# Patient Record
Sex: Female | Born: 1960 | Race: White | Hispanic: No | Marital: Married | Smoking: Never smoker
Health system: Southern US, Community
[De-identification: ages and names within clinical notes are randomized; demographics above are authoritative.]

## PROBLEM LIST (undated history)

## (undated) DIAGNOSIS — R112 Nausea with vomiting, unspecified: Secondary | ICD-10-CM

## (undated) DIAGNOSIS — G4700 Insomnia, unspecified: Secondary | ICD-10-CM

## (undated) DIAGNOSIS — D373 Neoplasm of uncertain behavior of appendix: Secondary | ICD-10-CM

## (undated) DIAGNOSIS — G43909 Migraine, unspecified, not intractable, without status migrainosus: Secondary | ICD-10-CM

## (undated) DIAGNOSIS — E0500 Thyrotoxicosis with diffuse goiter without thyrotoxic crisis or storm: Secondary | ICD-10-CM

## (undated) DIAGNOSIS — Z87440 Personal history of urinary (tract) infections: Secondary | ICD-10-CM

## (undated) DIAGNOSIS — K529 Noninfective gastroenteritis and colitis, unspecified: Secondary | ICD-10-CM

## (undated) DIAGNOSIS — E039 Hypothyroidism, unspecified: Secondary | ICD-10-CM

## (undated) DIAGNOSIS — I10 Essential (primary) hypertension: Secondary | ICD-10-CM

## (undated) DIAGNOSIS — E785 Hyperlipidemia, unspecified: Secondary | ICD-10-CM

## (undated) DIAGNOSIS — Z9889 Other specified postprocedural states: Secondary | ICD-10-CM

## (undated) DIAGNOSIS — R87619 Unspecified abnormal cytological findings in specimens from cervix uteri: Secondary | ICD-10-CM

## (undated) DIAGNOSIS — G4730 Sleep apnea, unspecified: Secondary | ICD-10-CM

## (undated) DIAGNOSIS — R002 Palpitations: Secondary | ICD-10-CM

## (undated) DIAGNOSIS — N938 Other specified abnormal uterine and vaginal bleeding: Secondary | ICD-10-CM

## (undated) DIAGNOSIS — D649 Anemia, unspecified: Secondary | ICD-10-CM

## (undated) DIAGNOSIS — J189 Pneumonia, unspecified organism: Secondary | ICD-10-CM

## (undated) DIAGNOSIS — I499 Cardiac arrhythmia, unspecified: Secondary | ICD-10-CM

## (undated) DIAGNOSIS — K8020 Calculus of gallbladder without cholecystitis without obstruction: Secondary | ICD-10-CM

## (undated) DIAGNOSIS — R911 Solitary pulmonary nodule: Secondary | ICD-10-CM

## (undated) DIAGNOSIS — C181 Malignant neoplasm of appendix: Secondary | ICD-10-CM

## (undated) DIAGNOSIS — F329 Major depressive disorder, single episode, unspecified: Secondary | ICD-10-CM

## (undated) DIAGNOSIS — N83209 Unspecified ovarian cyst, unspecified side: Secondary | ICD-10-CM

## (undated) DIAGNOSIS — F32A Depression, unspecified: Secondary | ICD-10-CM

## (undated) DIAGNOSIS — K219 Gastro-esophageal reflux disease without esophagitis: Secondary | ICD-10-CM

## (undated) DIAGNOSIS — C801 Malignant (primary) neoplasm, unspecified: Secondary | ICD-10-CM

## (undated) DIAGNOSIS — Z9289 Personal history of other medical treatment: Secondary | ICD-10-CM

## (undated) DIAGNOSIS — F419 Anxiety disorder, unspecified: Secondary | ICD-10-CM

## (undated) HISTORY — PX: TONSILLECTOMY: SUR1361

## (undated) HISTORY — DX: Unspecified abnormal cytological findings in specimens from cervix uteri: R87.619

## (undated) HISTORY — DX: Insomnia, unspecified: G47.00

## (undated) HISTORY — DX: Hyperlipidemia, unspecified: E78.5

## (undated) HISTORY — DX: Morbid (severe) obesity due to excess calories: E66.01

## (undated) HISTORY — DX: Essential (primary) hypertension: I10

## (undated) HISTORY — PX: SKIN CANCER EXCISION: SHX779

## (undated) HISTORY — DX: Palpitations: R00.2

## (undated) HISTORY — DX: Gastro-esophageal reflux disease without esophagitis: K21.9

## (undated) HISTORY — DX: Noninfective gastroenteritis and colitis, unspecified: K52.9

## (undated) HISTORY — PX: VAGINAL HYSTERECTOMY: SUR661

## (undated) HISTORY — DX: Unspecified ovarian cyst, unspecified side: N83.209

## (undated) HISTORY — PX: DILATION AND CURETTAGE OF UTERUS: SHX78

## (undated) HISTORY — DX: Malignant neoplasm of appendix: C18.1

## (undated) HISTORY — DX: Migraine, unspecified, not intractable, without status migrainosus: G43.909

## (undated) HISTORY — PX: NECK SURGERY: SHX720

## (undated) HISTORY — DX: Depression, unspecified: F32.A

## (undated) HISTORY — DX: Other specified abnormal uterine and vaginal bleeding: N93.8

## (undated) HISTORY — DX: Neoplasm of uncertain behavior of appendix: D37.3

## (undated) HISTORY — DX: Major depressive disorder, single episode, unspecified: F32.9

## (undated) HISTORY — DX: Sleep apnea, unspecified: G47.30

## (undated) HISTORY — PX: DIAGNOSTIC LAPAROSCOPY: SUR761

## (undated) HISTORY — PX: OOPHORECTOMY: SHX86

## (undated) HISTORY — DX: Hypothyroidism, unspecified: E03.9

## (undated) HISTORY — PX: APPENDECTOMY: SHX54

## (undated) HISTORY — PX: VESICOVAGINAL FISTULA CLOSURE W/ TAH: SUR271

## (undated) HISTORY — DX: Calculus of gallbladder without cholecystitis without obstruction: K80.20

## (undated) HISTORY — PX: OTHER SURGICAL HISTORY: SHX169

## (undated) HISTORY — PX: CRYOTHERAPY: SHX1416

---

## 1999-12-08 ENCOUNTER — Other Ambulatory Visit: Admission: RE | Admit: 1999-12-08 | Discharge: 1999-12-08 | Payer: Self-pay | Admitting: Obstetrics and Gynecology

## 2004-06-09 ENCOUNTER — Encounter: Admission: RE | Admit: 2004-06-09 | Discharge: 2004-06-09 | Payer: Self-pay | Admitting: Family Medicine

## 2005-12-15 DIAGNOSIS — D099 Carcinoma in situ, unspecified: Secondary | ICD-10-CM

## 2005-12-15 DIAGNOSIS — D229 Melanocytic nevi, unspecified: Secondary | ICD-10-CM

## 2005-12-15 HISTORY — DX: Carcinoma in situ, unspecified: D09.9

## 2005-12-15 HISTORY — DX: Melanocytic nevi, unspecified: D22.9

## 2006-06-23 DIAGNOSIS — C4491 Basal cell carcinoma of skin, unspecified: Secondary | ICD-10-CM

## 2006-06-23 HISTORY — DX: Basal cell carcinoma of skin, unspecified: C44.91

## 2010-06-10 ENCOUNTER — Encounter: Admission: RE | Admit: 2010-06-10 | Discharge: 2010-06-10 | Payer: Self-pay | Admitting: Family Medicine

## 2011-02-12 DIAGNOSIS — R002 Palpitations: Secondary | ICD-10-CM

## 2011-02-12 DIAGNOSIS — N938 Other specified abnormal uterine and vaginal bleeding: Secondary | ICD-10-CM

## 2011-02-12 DIAGNOSIS — E0500 Thyrotoxicosis with diffuse goiter without thyrotoxic crisis or storm: Secondary | ICD-10-CM

## 2011-05-05 ENCOUNTER — Encounter: Payer: Self-pay | Admitting: Nurse Practitioner

## 2011-06-11 ENCOUNTER — Other Ambulatory Visit: Payer: Self-pay | Admitting: Family Medicine

## 2011-06-11 DIAGNOSIS — Z1231 Encounter for screening mammogram for malignant neoplasm of breast: Secondary | ICD-10-CM

## 2011-06-17 ENCOUNTER — Ambulatory Visit
Admission: RE | Admit: 2011-06-17 | Discharge: 2011-06-17 | Disposition: A | Discharge status: Home / Self Care | Payer: 59 | Source: Ambulatory Visit | Attending: Family Medicine | Admitting: Family Medicine

## 2011-06-17 DIAGNOSIS — Z1231 Encounter for screening mammogram for malignant neoplasm of breast: Secondary | ICD-10-CM

## 2012-06-08 ENCOUNTER — Other Ambulatory Visit: Payer: Self-pay | Admitting: Family Medicine

## 2012-06-08 DIAGNOSIS — Z1231 Encounter for screening mammogram for malignant neoplasm of breast: Secondary | ICD-10-CM

## 2012-08-09 ENCOUNTER — Ambulatory Visit: Payer: 59

## 2013-02-27 ENCOUNTER — Other Ambulatory Visit: Payer: Self-pay | Admitting: *Deleted

## 2013-02-27 MED ORDER — FENOFIBRATE 160 MG PO TABS: 160.0000 mg | ORAL_TABLET | Freq: Every day | ORAL | Status: DC

## 2013-02-27 MED ORDER — ESCITALOPRAM OXALATE 10 MG PO TABS: 10.0000 mg | ORAL_TABLET | Freq: Every day | ORAL | Status: DC

## 2013-02-27 MED ORDER — VALSARTAN-HYDROCHLOROTHIAZIDE 160-12.5 MG PO TABS: 1.0000 | ORAL_TABLET | Freq: Every day | ORAL | Status: DC

## 2013-02-27 NOTE — Telephone Encounter (Signed)
Last office visit on 06-06-12 with DFS. Please advise. Thank you

## 2013-03-21 ENCOUNTER — Telehealth: Payer: Self-pay | Admitting: Family Medicine

## 2013-03-21 MED ORDER — SCOPOLAMINE 1 MG/3DAYS TD PT72: 1.0000 | MEDICATED_PATCH | TRANSDERMAL | Status: DC

## 2013-03-21 NOTE — Telephone Encounter (Signed)
Pt aware rx sent into pharmacy. 

## 2013-03-21 NOTE — Telephone Encounter (Signed)
Patch RX  Sent to pharmacy

## 2013-03-21 NOTE — Telephone Encounter (Signed)
Patient wants sea sick patches for 9 days going on a cruise use [facility] [place]

## 2013-03-28 ENCOUNTER — Encounter: Payer: Self-pay | Admitting: Nurse Practitioner

## 2013-03-28 ENCOUNTER — Ambulatory Visit (INDEPENDENT_AMBULATORY_CARE_PROVIDER_SITE_OTHER): Payer: 59 | Admitting: Nurse Practitioner

## 2013-03-28 VITALS — BP 118/80 | HR 61 | Temp 98.1°F | Ht 69.5 in | Wt 233.0 lb

## 2013-03-28 DIAGNOSIS — E785 Hyperlipidemia, unspecified: Secondary | ICD-10-CM | POA: Insufficient documentation

## 2013-03-28 DIAGNOSIS — F329 Major depressive disorder, single episode, unspecified: Secondary | ICD-10-CM

## 2013-03-28 DIAGNOSIS — I10 Essential (primary) hypertension: Secondary | ICD-10-CM

## 2013-03-28 DIAGNOSIS — K219 Gastro-esophageal reflux disease without esophagitis: Secondary | ICD-10-CM

## 2013-03-28 DIAGNOSIS — E039 Hypothyroidism, unspecified: Secondary | ICD-10-CM | POA: Insufficient documentation

## 2013-03-28 MED ORDER — PANTOPRAZOLE SODIUM 40 MG PO TBEC: 40.0000 mg | DELAYED_RELEASE_TABLET | Freq: Every day | ORAL | Status: DC

## 2013-03-28 MED ORDER — VALSARTAN-HYDROCHLOROTHIAZIDE 160-12.5 MG PO TABS: 1.0000 | ORAL_TABLET | Freq: Every day | ORAL | Status: DC

## 2013-03-28 MED ORDER — ESCITALOPRAM OXALATE 10 MG PO TABS: 10.0000 mg | ORAL_TABLET | Freq: Every day | ORAL | Status: DC

## 2013-03-28 MED ORDER — METOPROLOL SUCCINATE ER 50 MG PO TB24: 50.0000 mg | ORAL_TABLET | Freq: Every day | ORAL | Status: DC

## 2013-03-28 MED ORDER — FENOFIBRATE 160 MG PO TABS: 160.0000 mg | ORAL_TABLET | Freq: Every day | ORAL | Status: DC

## 2013-03-28 NOTE — Progress Notes (Signed)
Subjective:    Patient ID: Casey [name], female    DOB: 12-Sep-1961, 52 y.o.   MRN: [id number]  Hypertension This is a chronic problem. The current episode started more than 1 year ago. The problem is unchanged. The problem is controlled. Associated symptoms include palpitations and shortness of breath. Pertinent negatives include no anxiety, blurred vision, chest pain or peripheral edema. Risk factors for coronary artery disease include dyslipidemia and obesity. Past treatments include angiotensin blockers and beta blockers. Compliance problems include diet and exercise.   Hyperlipidemia This is a chronic problem. The current episode started more than 1 year ago. The problem is controlled. Recent lipid tests were reviewed and are normal. Exacerbating diseases include obesity. Associated symptoms include shortness of breath. Pertinent negatives include no chest pain. Current antihyperlipidemic treatment includes diet change. Risk factors for coronary artery disease include hypertension and obesity.  Anxiety Presents for follow-up visit. Symptoms include nervous/anxious behavior, palpitations and shortness of breath. Patient reports no chest pain or irritability. Symptoms occur rarely. The quality of sleep is good. Nighttime awakenings: occasional.   Compliance with medications is 51-75%.  Hypothyroidism Levothyroxine working well - No c/o side effects Depression Lexapro helps- Keeps her calm GERD Protonix working well - keeps symptoms under control   Review of Systems  Constitutional: Negative for irritability.  Eyes: Negative for blurred vision.  Respiratory: Positive for shortness of breath.   Cardiovascular: Positive for palpitations. Negative for chest pain.  Psychiatric/Behavioral: The patient is nervous/anxious.   All other systems reviewed and are negative.       Objective:   Physical Exam  Constitutional: She is oriented to person, place, and time. She appears well-developed  and well-nourished.  HENT:  Nose: Nose normal.  Mouth/Throat: Oropharynx is clear and moist.  Eyes: EOM are normal.  Neck: Trachea normal, normal range of motion and full passive range of motion without pain. Neck supple. No JVD present. Carotid bruit is not present. No thyromegaly present.  Cardiovascular: Normal rate, regular rhythm, normal heart sounds and intact distal pulses.  Exam reveals no gallop and no friction rub.   No murmur heard. Pulmonary/Chest: Effort normal and breath sounds normal.  Abdominal: Soft. Bowel sounds are normal. She exhibits no distension and no mass. There is no tenderness.  Musculoskeletal: Normal range of motion.  Lymphadenopathy:    She has no cervical adenopathy.  Neurological: She is alert and oriented to person, place, and time. She has normal reflexes.  Skin: Skin is warm and dry.  Psychiatric: She has a normal mood and affect. Her behavior is normal. Judgment and thought content normal.   BP 118/80  Pulse 61  Temp(Src) 98.1 F (36.7 C) (Oral)  Ht 5' 9.5" (1.765 m)  Wt 233 lb (105.688 kg)  BMI 33.93 kg/m2        Assessment & Plan:  1. Hypertension Low NA+ diet - valsartan-hydrochlorothiazide (DIOVAN-HCT) 160-12.5 MG per tablet; Take 1 tablet by mouth daily.  Dispense: 30 tablet; Refill: 5 - metoprolol succinate (TOPROL-XL) 50 MG 24 hr tablet; Take 1 tablet (50 mg total) by mouth daily.  Dispense: 30 tablet; Refill: 5 - COMPLETE METABOLIC PANEL WITH GFR  2. Hyperlipidemia Low fat diet and exercise - fenofibrate 160 MG tablet; Take 1 tablet (160 mg total) by mouth daily.  Dispense: 30 tablet; Refill: 5 - NMR Lipoprofile with Lipids  3. Hypothyroidism - TSH pending  4. GERD (gastroesophageal reflux disease) Avoid spicy and fatty foods when have flare-up - pantoprazole (PROTONIX) 40 MG tablet;  Take 1 tablet (40 mg total) by mouth daily.  Dispense: 30 tablet; Refill: 5  5. Depression Stress management - escitalopram (LEXAPRO) 10 MG  tablet; Take 1 tablet (10 mg total) by mouth daily.  Dispense: 30 tablet; Refill: 5  [name] Daphine Deutscher, FNP

## 2013-03-28 NOTE — Patient Instructions (Signed)

## 2013-03-29 LAB — COMPLETE METABOLIC PANEL WITH GFR
AST: 20 U/L (ref 0–37)
Alkaline Phosphatase: 36 U/L — ABNORMAL LOW (ref 39–117)
Creat: 1.01 mg/dL (ref 0.50–1.10)
GFR, Est African American: 74 mL/min
GFR, Est Non African American: 65 mL/min
Glucose, Bld: 81 mg/dL (ref 70–99)
Potassium: 3.7 mEq/L (ref 3.5–5.3)
Sodium: 140 mEq/L (ref 135–145)

## 2013-03-30 LAB — NMR LIPOPROFILE WITH LIPIDS
Cholesterol, Total: 146 mg/dL (ref <200)
HDL Particle Number: 39.7 umol/L (ref >=30.5)
HDL-C: 51 mg/dL (ref >=40)
LDL Particle Number: 1256 nmol/L — ABNORMAL HIGH (ref <1000)
Large HDL-P: 2.8 umol/L — ABNORMAL LOW (ref >=4.8)
Large VLDL-P: 3.7 nmol/L — ABNORMAL HIGH (ref <=2.7)
Triglycerides: 74 mg/dL (ref <150)

## 2013-05-28 ENCOUNTER — Other Ambulatory Visit: Payer: Self-pay

## 2013-05-28 DIAGNOSIS — I10 Essential (primary) hypertension: Secondary | ICD-10-CM

## 2013-05-28 MED ORDER — LEVOTHYROXINE SODIUM 25 MCG PO TABS: 25.0000 ug | ORAL_TABLET | Freq: Every day | ORAL | Status: DC

## 2013-05-28 MED ORDER — METOPROLOL SUCCINATE ER 50 MG PO TB24: 50.0000 mg | ORAL_TABLET | Freq: Every day | ORAL | Status: DC

## 2013-06-13 ENCOUNTER — Other Ambulatory Visit: Payer: Self-pay

## 2013-06-13 DIAGNOSIS — Z1231 Encounter for screening mammogram for malignant neoplasm of breast: Secondary | ICD-10-CM

## 2013-06-25 ENCOUNTER — Encounter: Payer: Self-pay | Admitting: Certified Nurse Midwife

## 2013-06-26 ENCOUNTER — Ambulatory Visit
Admission: RE | Admit: 2013-06-26 | Discharge: 2013-06-26 | Disposition: A | Discharge status: Home / Self Care | Payer: 59 | Source: Ambulatory Visit

## 2013-06-26 ENCOUNTER — Encounter: Payer: Self-pay | Admitting: Certified Nurse Midwife

## 2013-06-26 ENCOUNTER — Other Ambulatory Visit: Payer: Self-pay | Admitting: *Deleted

## 2013-06-26 ENCOUNTER — Ambulatory Visit: Payer: 59

## 2013-06-26 ENCOUNTER — Ambulatory Visit (INDEPENDENT_AMBULATORY_CARE_PROVIDER_SITE_OTHER): Payer: 59 | Admitting: Certified Nurse Midwife

## 2013-06-26 VITALS — BP 118/70 | HR 64 | Resp 16 | Ht 70.25 in | Wt 237.0 lb

## 2013-06-26 DIAGNOSIS — Z0000 Encounter for general adult medical examination without abnormal findings: Secondary | ICD-10-CM

## 2013-06-26 DIAGNOSIS — Z1231 Encounter for screening mammogram for malignant neoplasm of breast: Secondary | ICD-10-CM

## 2013-06-26 DIAGNOSIS — Z01419 Encounter for gynecological examination (general) (routine) without abnormal findings: Secondary | ICD-10-CM

## 2013-06-26 LAB — POCT URINALYSIS DIPSTICK
Blood, UA: NEGATIVE
Glucose, UA: NEGATIVE
Nitrite, UA: NEGATIVE
Protein, UA: NEGATIVE
Urobilinogen, UA: NEGATIVE
pH, UA: 5

## 2013-06-26 MED ORDER — LEVOTHYROXINE SODIUM 50 MCG PO TABS: 50.0000 ug | ORAL_TABLET | Freq: Every day | ORAL | Status: DC

## 2013-06-26 NOTE — Progress Notes (Signed)
52 y.o. G33P3003 Married Caucasian Fe here for annual exam.  Menopausal, no HRT. Denies vaginal bleeding, uses OTC moisturizer for dryness as needed. Occasional urinary frequency with excessive caffeine use, but no other symptoms. Daughter continues to have seizures of unknown origin, now at [facility]..  No health issues today. Sees PCP for aex and medication management, labs. Hypertension and thyroid stable on medication   Patient's last menstrual period was 11/22/1994.          Sexually active: yes  The current method of family planning is status post hysterectomy.    Exercising: no  exercise Smoker:  no  Health Maintenance: Pap:  06-10-10 neg MMG:  06-17-11 normal, scheduled for today Colonoscopy:  none BMD:   none TDaP:  2007 Labs: Poct urine-trace wbc Self breast exam: not done   reports that she has never smoked. She does not have any smokeless tobacco history on file. She reports that she does not drink alcohol or use illicit drugs.  Past Medical History  Diagnosis Date  . Grave's disease   . DUB (dysfunctional uterine bleeding)   . Palpitations   . Hypertension   . Hyperlipidemia   . Depression   . GERD (gastroesophageal reflux disease)   . Migraines     Past Surgical History  Procedure Laterality Date  . Vesicovaginal fistula closure w/ tah      Dr. Myrlene Broker  2 degree DUB   . Tonsillectomy    . Vaginal hysterectomy      Current Outpatient Prescriptions  Medication Sig Dispense Refill  . cetirizine (ZYRTEC) 10 MG tablet Take 10 mg by mouth daily.        Marland Kitchen escitalopram (LEXAPRO) 10 MG tablet Take 1 tablet (10 mg total) by mouth daily.  30 tablet  5  . fenofibrate 160 MG tablet Take 1 tablet (160 mg total) by mouth daily.  30 tablet  5  . levothyroxine (SYNTHROID, LEVOTHROID) 25 MCG tablet Take 1 tablet (25 mcg total) by mouth daily.  30 tablet  9  . metoprolol succinate (TOPROL-XL) 50 MG 24 hr tablet Take 1 tablet (50 mg total) by mouth daily.  30 tablet  3  .  pantoprazole (PROTONIX) 40 MG tablet Take 1 tablet (40 mg total) by mouth daily.  30 tablet  5  . valsartan-hydrochlorothiazide (DIOVAN-HCT) 160-12.5 MG per tablet Take 1 tablet by mouth daily.  30 tablet  5   No current facility-administered medications for this visit.    Family History  Problem Relation Age of Onset  . Hypertension Mother   . Irregular heart beat Mother   . Thyroid disease Maternal Grandmother   . Cancer Maternal Grandmother     lung  . Cancer Paternal Grandfather     lung    ROS:  Pertinent items are noted in HPI.  Otherwise, a comprehensive ROS was negative.  Exam:   BP 118/70  Pulse 64  Resp 16  Ht 5' 10.25" (1.784 m)  Wt 237 lb (107.502 kg)  BMI 33.78 kg/m2  LMP 11/22/1994 Height: 5' 10.25" (178.4 cm)  Ht Readings from Last 3 Encounters:  06/26/13 5' 10.25" (1.784 m)  03/28/13 5' 9.5" (1.765 m)    General appearance: alert, cooperative and appears stated age Head: Normocephalic, without obvious abnormality, atraumatic Neck: no adenopathy, supple, symmetrical, trachea midline and thyroid normal to inspection and palpation Lungs: clear to auscultation bilaterally Breasts: normal appearance, no masses or tenderness, No nipple retraction or dimpling, No nipple discharge or bleeding, No  axillary or supraclavicular adenopathy Heart: regular rate and rhythm Abdomen: soft, non-tender; no masses,  no organomegaly Extremities: extremities normal, atraumatic, no cyanosis or edema Skin: Skin color, texture, turgor normal. No rashes or lesions Lymph nodes: Cervical, supraclavicular, and axillary nodes normal. No abnormal inguinal nodes palpated Neurologic: Grossly normal   Pelvic: External genitalia:  no lesions              Urethra:  normal appearing urethra with no masses, tenderness or lesions              Bartholin's and Skene's: normal                 Vagina: normal appearing vagina with normal color and discharge, no lesions              Cervix:  absent              Pap taken: no Bimanual Exam:  Uterus:  uterus absent              Adnexa: normal adnexa and no mass, fullness, tenderness               Rectovaginal: Confirms               Anus:  normal sphincter tone, no lesions  A:  Well Woman with normal exam  Menopausal no HRT, S/P TVH for menorrhagia  Hypertension/Hypothyroid on stable medication with PCP management  Social stress with daughter health problem  P:   Reviewed health and wellness pertinent to exam  Continue follow up as indicated  Discussed seeking friend, family and church support, and counseling as needed. Patient feels she has a good support system.  Pap smear as per guidelines   Mammogram yearly pap smear not taken today  counseled on breast self exam, mammography screening, menopause, adequate intake of calcium and vitamin D, diet and exercise, Kegel's exercises  return annually or prn  An After Visit Summary was printed and given to the patient.

## 2013-06-26 NOTE — Patient Instructions (Addendum)

## 2013-06-27 ENCOUNTER — Other Ambulatory Visit: Payer: Self-pay | Admitting: Family Medicine

## 2013-06-27 DIAGNOSIS — R928 Other abnormal and inconclusive findings on diagnostic imaging of breast: Secondary | ICD-10-CM

## 2013-06-28 NOTE — Progress Notes (Signed)
Note reviewed, agree with plan.  Elleigh Cassetta, MD  

## 2013-07-19 ENCOUNTER — Ambulatory Visit
Admission: RE | Admit: 2013-07-19 | Discharge: 2013-07-19 | Disposition: A | Discharge status: Home / Self Care | Payer: 59 | Source: Ambulatory Visit | Attending: Family Medicine | Admitting: Family Medicine

## 2013-07-19 DIAGNOSIS — R928 Other abnormal and inconclusive findings on diagnostic imaging of breast: Secondary | ICD-10-CM

## 2013-08-20 ENCOUNTER — Telehealth: Payer: Self-pay | Admitting: Nurse Practitioner

## 2013-08-20 ENCOUNTER — Encounter: Payer: Self-pay | Admitting: Family Medicine

## 2013-08-20 ENCOUNTER — Ambulatory Visit (INDEPENDENT_AMBULATORY_CARE_PROVIDER_SITE_OTHER): Payer: 59 | Admitting: Family Medicine

## 2013-08-20 VITALS — BP 115/80 | HR 66 | Temp 98.4°F | Wt 241.6 lb

## 2013-08-20 DIAGNOSIS — R300 Dysuria: Secondary | ICD-10-CM

## 2013-08-20 DIAGNOSIS — N3090 Cystitis, unspecified without hematuria: Secondary | ICD-10-CM

## 2013-08-20 LAB — POCT URINALYSIS DIPSTICK

## 2013-08-20 LAB — POCT UA - MICROSCOPIC ONLY
Casts, Ur, LPF, POC: NEGATIVE
Crystals, Ur, HPF, POC: NEGATIVE
Mucus, UA: NEGATIVE
Yeast, UA: NEGATIVE

## 2013-08-20 MED ORDER — CIPROFLOXACIN HCL 500 MG PO TABS: 500.0000 mg | ORAL_TABLET | Freq: Two times a day (BID) | ORAL | Status: DC

## 2013-08-20 NOTE — Telephone Encounter (Signed)
Pt has uti appt scheduled

## 2013-08-20 NOTE — Progress Notes (Signed)
Patient ID: Casey [name], female   DOB: Jun 09, 1961, 52 y.o.   MRN: [id number] SUBJECTIVE: CC: Chief Complaint  Patient presents with  . Acute Visit    UTI    HPI: Urinary Tract Infection Patient complains of dysuria and frequency. She has had symptoms for 2 months. Patient also complains of intermittent occurrence. Patient denies back pain, fever, sorethroat, stomach ache and vaginal discharge. Patient does not have a history of recurrent UTI. Patient does not have a history of pyelonephritis.  Has had congestion. And sinus congestion   Past Medical History  Diagnosis Date  . Grave's disease   . DUB (dysfunctional uterine bleeding)   . Palpitations   . Hypertension   . Hyperlipidemia   . Depression   . GERD (gastroesophageal reflux disease)   . Migraines    Past Surgical History  Procedure Laterality Date  . Vesicovaginal fistula closure w/ tah      Dr. Myrlene Broker  2 degree DUB   . Tonsillectomy    . Vaginal hysterectomy     History   Social History  . Marital Status: Married    Spouse Name: N/A    Number of Children: N/A  . Years of Education: N/A   Occupational History  . Not on file.   Social History Main Topics  . Smoking status: Never Smoker   . Smokeless tobacco: Not on file  . Alcohol Use: No  . Drug Use: No  . Sexual Activity: Yes    Partners: Male    Birth Control/ Protection: Surgical     Comment: TVH   Other Topics Concern  . Not on file   Social History Narrative  . No narrative on file   Family History  Problem Relation Age of Onset  . Hypertension Mother   . Irregular heart beat Mother   . Thyroid disease Maternal Grandmother   . Cancer Maternal Grandmother     lung  . Cancer Paternal Grandfather     lung   Current Outpatient Prescriptions on File Prior to Visit  Medication Sig Dispense Refill  . cetirizine (ZYRTEC) 10 MG tablet Take 10 mg by mouth daily.        Marland Kitchen escitalopram (LEXAPRO) 10 MG tablet Take 1 tablet (10 mg total) by  mouth daily.  30 tablet  5  . fenofibrate 160 MG tablet Take 1 tablet (160 mg total) by mouth daily.  30 tablet  5  . levothyroxine (SYNTHROID, LEVOTHROID) 50 MCG tablet Take 1 tablet (50 mcg total) by mouth daily before breakfast.  30 tablet  9  . metoprolol succinate (TOPROL-XL) 50 MG 24 hr tablet Take 1 tablet (50 mg total) by mouth daily.  30 tablet  3  . pantoprazole (PROTONIX) 40 MG tablet Take 40 mg by mouth as needed.      . valsartan-hydrochlorothiazide (DIOVAN-HCT) 160-12.5 MG per tablet Take 1 tablet by mouth daily.  30 tablet  5   No current facility-administered medications on file prior to visit.   Allergies  Allergen Reactions  . Labetalol    Immunization History  Administered Date(s) Administered  . Tdap 11/22/2005   Prior to Admission medications   Medication Sig Start Date End Date Taking? Authorizing Provider  cetirizine (ZYRTEC) 10 MG tablet Take 10 mg by mouth daily.     Yes Historical Provider, MD  escitalopram (LEXAPRO) 10 MG tablet Take 1 tablet (10 mg total) by mouth daily. 03/28/13  Yes [name] Daphine Deutscher, FNP  fenofibrate 160 MG tablet Take  1 tablet (160 mg total) by mouth daily. 03/28/13  Yes [name] Daphine Deutscher, FNP  levothyroxine (SYNTHROID, LEVOTHROID) 50 MCG tablet Take 1 tablet (50 mcg total) by mouth daily before breakfast. 06/26/13  Yes [name] Daphine Deutscher, FNP  metoprolol succinate (TOPROL-XL) 50 MG 24 hr tablet Take 1 tablet (50 mg total) by mouth daily. 05/28/13  Yes [name] Daphine Deutscher, FNP  pantoprazole (PROTONIX) 40 MG tablet Take 40 mg by mouth as needed. 03/28/13  Yes [name] Daphine Deutscher, FNP  valsartan-hydrochlorothiazide (DIOVAN-HCT) 160-12.5 MG per tablet Take 1 tablet by mouth daily. 03/28/13  Yes [name] Daphine Deutscher, FNP      ROS: As above in the HPI. All other systems are stable or negative.  OBJECTIVE: APPEARANCE:  Patient in no acute distress.The patient appeared well nourished and normally developed. Acyanotic. Waist: VITAL SIGNS:BP  115/80  Pulse 66  Temp(Src) 98.4 F (36.9 C) (Oral)  Wt 241 lb 9.6 oz (109.589 kg)  BMI 34.43 kg/m2  LMP 05/23/1995 WF Obese  SKIN: warm and  Dry without overt rashes, tattoos and scars  HEAD and Neck: without JVD, Head and scalp: normal Eyes:No scleral icterus. Fundi normal, eye movements normal. Ears: Auricle normal, canal normal, Tympanic membranes normal, insufflation normal. Nose: normal Throat: normal Neck & thyroid: normal  CHEST & LUNGS: Chest wall: normal Lungs: Clear  CVS: Reveals the PMI to be normally located. Regular rhythm, First and Second Heart sounds are normal,  absence of murmurs, rubs or gallops. Peripheral vasculature: Radial pulses: normal Dorsal pedis pulses: normal Posterior pulses: normal  ABDOMEN:  Appearance: Obese Benign, no organomegaly, no masses, no Abdominal Aortic enlargement. No Guarding , no rebound. No Bruits. Bowel sounds: normal  RECTAL: N/A GU: N/A  EXTREMETIES: nonedematous.  MUSCULOSKELETAL:  Spine: normal Joints: intact  NEUROLOGIC: oriented to time,place and person; nonfocal.  Results for orders placed in visit on 08/20/13  POCT UA - MICROSCOPIC ONLY      Result Value Range   WBC, Ur, HPF, POC tntc     RBC, urine, microscopic tntc     Bacteria, U Microscopic moderate     Mucus, UA negative     Epithelial cells, urine per micros few     Crystals, Ur, HPF, POC negative     Casts, Ur, LPF, POC negative     Yeast, UA negative    POCT URINALYSIS DIPSTICK      Result Value Range   Color, UA red     Clarity, UA cloudy      ASSESSMENT: Dysuria - Plan: POCT UA - Microscopic Only, POCT urinalysis dipstick, Urine culture, ciprofloxacin (CIPRO) 500 MG tablet  Cystitis - Plan: ciprofloxacin (CIPRO) 500 MG tablet, Urine culture  PLAN:  Orders Placed This Encounter  Procedures  . Urine culture  . Urine culture    Standing Status: Future     Number of Occurrences:      Standing Expiration Date: 08/20/2014  .  POCT UA - Microscopic Only  . POCT urinalysis dipstick    Meds ordered this encounter  Medications  . ciprofloxacin (CIPRO) 500 MG tablet    Sig: Take 1 tablet (500 mg total) by mouth 2 (two) times daily.    Dispense:  20 tablet    Refill:  0   Fluids  Discussed UTI and since symptoms persisted since 2 months will culture and repeat culture in 3 weeks. To ensure clearANCE.  Return in about 3 weeks (around 09/10/2013) for Recheck urinary tract infection.  Davi Rotan P. [place]esto Charon, M.D.

## 2013-08-22 LAB — URINE CULTURE

## 2013-08-23 NOTE — Progress Notes (Signed)
Quick Note:  Labs abnormal. UTI due to E Coli. The Cipro should clear it. Recheck for clearance in 3 weeks.  ______

## 2013-09-03 ENCOUNTER — Other Ambulatory Visit: Payer: Self-pay

## 2013-09-03 DIAGNOSIS — F329 Major depressive disorder, single episode, unspecified: Secondary | ICD-10-CM

## 2013-09-03 DIAGNOSIS — E785 Hyperlipidemia, unspecified: Secondary | ICD-10-CM

## 2013-09-03 DIAGNOSIS — I10 Essential (primary) hypertension: Secondary | ICD-10-CM

## 2013-09-03 MED ORDER — ESCITALOPRAM OXALATE 10 MG PO TABS: 10.0000 mg | ORAL_TABLET | Freq: Every day | ORAL | Status: DC

## 2013-09-03 MED ORDER — VALSARTAN-HYDROCHLOROTHIAZIDE 160-12.5 MG PO TABS: 1.0000 | ORAL_TABLET | Freq: Every day | ORAL | Status: DC

## 2013-09-03 MED ORDER — FENOFIBRATE 160 MG PO TABS: 160.0000 mg | ORAL_TABLET | Freq: Every day | ORAL | Status: DC

## 2013-10-01 ENCOUNTER — Other Ambulatory Visit: Payer: Self-pay

## 2013-10-01 DIAGNOSIS — I10 Essential (primary) hypertension: Secondary | ICD-10-CM

## 2013-10-01 DIAGNOSIS — E785 Hyperlipidemia, unspecified: Secondary | ICD-10-CM

## 2013-10-01 MED ORDER — VALSARTAN-HYDROCHLOROTHIAZIDE 160-12.5 MG PO TABS: 1.0000 | ORAL_TABLET | Freq: Every day | ORAL | Status: DC

## 2013-10-01 NOTE — Telephone Encounter (Signed)
Last sen 07/29/13  FPW  Last lipid 03/28/13

## 2013-10-01 NOTE — Telephone Encounter (Signed)
Debbie what does patient need

## 2013-11-05 ENCOUNTER — Other Ambulatory Visit: Payer: Self-pay

## 2013-11-05 DIAGNOSIS — E785 Hyperlipidemia, unspecified: Secondary | ICD-10-CM

## 2013-11-05 MED ORDER — FENOFIBRATE 160 MG PO TABS: 160.0000 mg | ORAL_TABLET | Freq: Every day | ORAL | Status: DC

## 2013-11-05 NOTE — Telephone Encounter (Signed)
Last seen 08/20/13  FPW  Last lipid 03/28/13 

## 2013-11-05 NOTE — Telephone Encounter (Signed)
Patient needs to be seen. Has exceeded time since last visit. Limited quantity refilled. Needs to bring all medications to next appointment.   

## 2013-12-04 ENCOUNTER — Other Ambulatory Visit: Payer: Self-pay

## 2013-12-04 DIAGNOSIS — F329 Major depressive disorder, single episode, unspecified: Secondary | ICD-10-CM

## 2013-12-04 DIAGNOSIS — E785 Hyperlipidemia, unspecified: Secondary | ICD-10-CM

## 2013-12-04 DIAGNOSIS — F32A Depression, unspecified: Secondary | ICD-10-CM

## 2013-12-04 MED ORDER — ESCITALOPRAM OXALATE 10 MG PO TABS: 10.0000 mg | ORAL_TABLET | Freq: Every day | ORAL | Status: DC

## 2013-12-04 MED ORDER — FENOFIBRATE 160 MG PO TABS: 160.0000 mg | ORAL_TABLET | Freq: Every day | ORAL | Status: DC

## 2013-12-04 NOTE — Telephone Encounter (Signed)
Patient needs to be seen. Has exceeded time since last visit. Limited quantity refilled. Needs to bring all medications to next appointment.   

## 2013-12-04 NOTE — Telephone Encounter (Signed)
Last seen 08/20/13  FPW  Last lipid 03/28/13

## 2014-01-03 ENCOUNTER — Other Ambulatory Visit: Payer: Self-pay | Admitting: Nurse Practitioner

## 2014-01-03 MED ORDER — OSELTAMIVIR PHOSPHATE 75 MG PO CAPS: 75.0000 mg | ORAL_CAPSULE | Freq: Every day | ORAL | Status: DC

## 2014-01-21 ENCOUNTER — Ambulatory Visit (INDEPENDENT_AMBULATORY_CARE_PROVIDER_SITE_OTHER): Payer: 59 | Admitting: Nurse Practitioner

## 2014-01-21 ENCOUNTER — Encounter: Payer: Self-pay | Admitting: Nurse Practitioner

## 2014-01-21 VITALS — BP 112/65 | HR 66 | Temp 97.6°F | Ht 70.0 in | Wt 245.0 lb

## 2014-01-21 DIAGNOSIS — E785 Hyperlipidemia, unspecified: Secondary | ICD-10-CM

## 2014-01-21 DIAGNOSIS — E039 Hypothyroidism, unspecified: Secondary | ICD-10-CM

## 2014-01-21 DIAGNOSIS — R8290 Unspecified abnormal findings in urine: Secondary | ICD-10-CM

## 2014-01-21 DIAGNOSIS — I10 Essential (primary) hypertension: Secondary | ICD-10-CM

## 2014-01-21 DIAGNOSIS — F3289 Other specified depressive episodes: Secondary | ICD-10-CM

## 2014-01-21 DIAGNOSIS — F32A Depression, unspecified: Secondary | ICD-10-CM

## 2014-01-21 DIAGNOSIS — F329 Major depressive disorder, single episode, unspecified: Secondary | ICD-10-CM

## 2014-01-21 DIAGNOSIS — R82998 Other abnormal findings in urine: Secondary | ICD-10-CM

## 2014-01-21 LAB — POCT URINALYSIS DIPSTICK
BILIRUBIN UA: NEGATIVE
GLUCOSE UA: NEGATIVE
Ketones, UA: NEGATIVE
Leukocytes, UA: NEGATIVE
NITRITE UA: NEGATIVE
Urobilinogen, UA: NEGATIVE
pH, UA: 5

## 2014-01-21 LAB — POCT UA - MICROSCOPIC ONLY
CASTS, UR, LPF, POC: NEGATIVE
Crystals, Ur, HPF, POC: NEGATIVE
MUCUS UA: NEGATIVE
Yeast, UA: NEGATIVE

## 2014-01-21 MED ORDER — ESCITALOPRAM OXALATE 10 MG PO TABS: 10.0000 mg | ORAL_TABLET | Freq: Every day | ORAL | Status: DC

## 2014-01-21 MED ORDER — VALSARTAN-HYDROCHLOROTHIAZIDE 160-12.5 MG PO TABS: 1.0000 | ORAL_TABLET | Freq: Every day | ORAL | Status: DC

## 2014-01-21 MED ORDER — FENOFIBRATE 160 MG PO TABS: 160.0000 mg | ORAL_TABLET | Freq: Every day | ORAL | Status: DC

## 2014-01-21 MED ORDER — METOPROLOL SUCCINATE ER 50 MG PO TB24: 50.0000 mg | ORAL_TABLET | Freq: Every day | ORAL | Status: DC

## 2014-01-21 MED ORDER — PANTOPRAZOLE SODIUM 40 MG PO TBEC: 40.0000 mg | DELAYED_RELEASE_TABLET | ORAL | Status: DC | PRN

## 2014-01-21 MED ORDER — LEVOTHYROXINE SODIUM 50 MCG PO TABS: 50.0000 ug | ORAL_TABLET | Freq: Every day | ORAL | Status: DC

## 2014-01-21 NOTE — Patient Instructions (Signed)

## 2014-01-21 NOTE — Progress Notes (Signed)
  Subjective:    Patient ID: Casey [name], female    DOB: 28-Jul-1961, 53 y.o.   MRN: [id number]  Patient here tday for follow up of chronic medical problems- He r only complaint is a UTI oin October that she was suppose to follow up on and she never did- SHe does C/o slight low back pain that started a couple of days ago- (+) malororous urine- slight frequency.  Back Pain Pertinent negatives include no chest pain.  Hypertension This is a chronic problem. The current episode started more than 1 year ago. The problem is unchanged. The problem is controlled. Associated symptoms include palpitations and shortness of breath. Pertinent negatives include no anxiety, blurred vision, chest pain or peripheral edema. Risk factors for coronary artery disease include dyslipidemia and obesity. Past treatments include angiotensin blockers and beta blockers. Compliance problems include diet and exercise.   Hyperlipidemia This is a chronic problem. The current episode started more than 1 year ago. The problem is controlled. Recent lipid tests were reviewed and are normal. Exacerbating diseases include obesity. Associated symptoms include shortness of breath. Pertinent negatives include no chest pain. Current antihyperlipidemic treatment includes diet change. Risk factors for coronary artery disease include hypertension and obesity.  Anxiety Presents for follow-up visit. Symptoms include nervous/anxious behavior, palpitations and shortness of breath. Patient reports no chest pain or irritability. Symptoms occur rarely. The quality of sleep is good. Nighttime awakenings: occasional.   Compliance with medications is 51-75%.  Hypothyroidism Levothyroxine working well - No c/o side effects Depression Lexapro helps- Keeps her calm GERD Protonix working well - keeps symptoms under control   Review of Systems  Constitutional: Negative for irritability.  Eyes: Negative for blurred vision.  Respiratory: Positive for  shortness of breath.   Cardiovascular: Positive for palpitations. Negative for chest pain.  Musculoskeletal: Positive for back pain.  Psychiatric/Behavioral: The patient is nervous/anxious.   All other systems reviewed and are negative.       Objective:   Physical Exam  Constitutional: She is oriented to person, place, and time. She appears well-developed and well-nourished.  HENT:  Nose: Nose normal.  Mouth/Throat: Oropharynx is clear and moist.  Eyes: EOM are normal.  Neck: Trachea normal, normal range of motion and full passive range of motion without pain. Neck supple. No JVD present. Carotid bruit is not present. No thyromegaly present.  Cardiovascular: Normal rate, regular rhythm, normal heart sounds and intact distal pulses.  Exam reveals no gallop and no friction rub.   No murmur heard. Pulmonary/Chest: Effort normal and breath sounds normal.  Abdominal: Soft. Bowel sounds are normal. She exhibits no distension and no mass. There is no tenderness.  Musculoskeletal: Normal range of motion.  Lymphadenopathy:    She has no cervical adenopathy.  Neurological: She is alert and oriented to person, place, and time. She has normal reflexes.  Skin: Skin is warm and dry.  Psychiatric: She has a normal mood and affect. Her behavior is normal. Judgment and thought content normal.   BP 112/65  Pulse 66  Temp(Src) 97.6 F (36.4 C) (Oral)  Ht 5\' 10"  (1.778 m)  Wt 245 lb (111.131 kg)  BMI 35.15 kg/m2  LMP 05/23/1995       Assessment & Plan:

## 2014-01-22 ENCOUNTER — Other Ambulatory Visit: Payer: Self-pay | Admitting: Nurse Practitioner

## 2014-01-22 LAB — CMP14+EGFR
ALT: 16 IU/L (ref 0–32)
AST: 13 IU/L (ref 0–40)
Albumin/Globulin Ratio: 1.8 (ref 1.1–2.5)
Albumin: 4.3 g/dL (ref 3.5–5.5)
Alkaline Phosphatase: 43 IU/L (ref 39–117)
BUN/Creatinine Ratio: 17 (ref 9–23)
BUN: 16 mg/dL (ref 6–24)
CO2: 28 mmol/L (ref 18–29)
Calcium: 9.8 mg/dL (ref 8.7–10.2)
Chloride: 100 mmol/L (ref 97–108)
Creatinine, Ser: 0.95 mg/dL (ref 0.57–1.00)
GFR calc Af Amer: 80 mL/min/{1.73_m2} (ref >59)
GFR calc non Af Amer: 69 mL/min/{1.73_m2} (ref >59)
Globulin, Total: 2.4 g/dL (ref 1.5–4.5)
Glucose: 93 mg/dL (ref 65–99)
Potassium: 4 mmol/L (ref 3.5–5.2)
SODIUM: 142 mmol/L (ref 134–144)
Total Bilirubin: 0.2 mg/dL (ref 0.0–1.2)
Total Protein: 6.7 g/dL (ref 6.0–8.5)

## 2014-01-22 LAB — NMR, LIPOPROFILE
CHOLESTEROL: 167 mg/dL (ref <200)
HDL CHOLESTEROL BY NMR: 53 mg/dL (ref >=40)
HDL PARTICLE NUMBER: 39.3 umol/L (ref >=30.5)
LDL Particle Number: 1266 nmol/L — ABNORMAL HIGH (ref <1000)
LDL Size: 20.8 nm (ref >20.5)
LDLC SERPL CALC-MCNC: 89 mg/dL (ref <100)
LP-IR Score: 67 — ABNORMAL HIGH (ref <=45)
Small LDL Particle Number: 679 nmol/L — ABNORMAL HIGH (ref <=527)
TRIGLYCERIDES BY NMR: 126 mg/dL (ref <150)

## 2014-01-22 LAB — THYROID PANEL WITH TSH
Free Thyroxine Index: 2.5 (ref 1.2–4.9)
T3 Uptake Ratio: 26 % (ref 24–39)
T4, Total: 9.8 ug/dL (ref 4.5–12.0)
TSH: 5.87 u[IU]/mL — AB (ref 0.450–4.500)

## 2014-01-22 MED ORDER — LEVOTHYROXINE SODIUM 75 MCG PO TABS: 75.0000 ug | ORAL_TABLET | Freq: Every day | ORAL | Status: DC

## 2014-01-25 ENCOUNTER — Telehealth: Payer: Self-pay | Admitting: Family Medicine

## 2014-01-25 NOTE — Telephone Encounter (Signed)
Message copied by Waverly Ferrari on Fri Jan 25, 2014 12:39 PM ------      Message from: Chevis Pretty      Created: Tue Jan 22, 2014  5:48 PM       Kidney and liver function stable      cholseterol looks good      tsh elevated- need to increase levothyroxin dose- rx sent to pharmacy- need to repeat labs in 6 weeks ------

## 2014-01-28 ENCOUNTER — Other Ambulatory Visit: Payer: Self-pay | Admitting: Nurse Practitioner

## 2014-03-18 ENCOUNTER — Ambulatory Visit (INDEPENDENT_AMBULATORY_CARE_PROVIDER_SITE_OTHER): Payer: 59 | Admitting: Nurse Practitioner

## 2014-03-18 ENCOUNTER — Encounter: Payer: Self-pay | Admitting: Nurse Practitioner

## 2014-03-18 VITALS — BP 113/71 | HR 76 | Temp 97.1°F | Ht 70.0 in | Wt 249.0 lb

## 2014-03-18 DIAGNOSIS — F329 Major depressive disorder, single episode, unspecified: Secondary | ICD-10-CM

## 2014-03-18 DIAGNOSIS — I10 Essential (primary) hypertension: Secondary | ICD-10-CM

## 2014-03-18 DIAGNOSIS — Z6835 Body mass index (BMI) 35.0-35.9, adult: Secondary | ICD-10-CM

## 2014-03-18 DIAGNOSIS — E785 Hyperlipidemia, unspecified: Secondary | ICD-10-CM

## 2014-03-18 DIAGNOSIS — F32A Depression, unspecified: Secondary | ICD-10-CM | POA: Insufficient documentation

## 2014-03-18 DIAGNOSIS — Z713 Dietary counseling and surveillance: Secondary | ICD-10-CM

## 2014-03-18 DIAGNOSIS — F3289 Other specified depressive episodes: Secondary | ICD-10-CM

## 2014-03-18 DIAGNOSIS — E039 Hypothyroidism, unspecified: Secondary | ICD-10-CM

## 2014-03-18 DIAGNOSIS — K219 Gastro-esophageal reflux disease without esophagitis: Secondary | ICD-10-CM

## 2014-03-18 MED ORDER — VALSARTAN-HYDROCHLOROTHIAZIDE 160-12.5 MG PO TABS: 1.0000 | ORAL_TABLET | Freq: Every day | ORAL | Status: DC

## 2014-03-18 MED ORDER — FENOFIBRATE 160 MG PO TABS: 160.0000 mg | ORAL_TABLET | Freq: Every day | ORAL | Status: DC

## 2014-03-18 MED ORDER — METOPROLOL SUCCINATE ER 50 MG PO TB24: 50.0000 mg | ORAL_TABLET | Freq: Every day | ORAL | Status: DC

## 2014-03-18 MED ORDER — PANTOPRAZOLE SODIUM 40 MG PO TBEC: 40.0000 mg | DELAYED_RELEASE_TABLET | ORAL | Status: DC | PRN

## 2014-03-18 MED ORDER — LEVOTHYROXINE SODIUM 75 MCG PO TABS: 75.0000 ug | ORAL_TABLET | Freq: Every day | ORAL | Status: DC

## 2014-03-18 MED ORDER — ESCITALOPRAM OXALATE 10 MG PO TABS: 10.0000 mg | ORAL_TABLET | Freq: Every day | ORAL | Status: DC

## 2014-03-18 NOTE — Progress Notes (Signed)
Subjective:    Patient ID: Casey [name], female    DOB: January 11, 1961, 53 y.o.   MRN: [id number]  Patient here today for follow up of chronic medical problems.  Hypertension This is a chronic problem. The current episode started more than 1 year ago. The problem is unchanged. The problem is controlled. Associated symptoms include palpitations and shortness of breath. Pertinent negatives include no anxiety, blurred vision, chest pain or peripheral edema. Risk factors for coronary artery disease include dyslipidemia and obesity. Past treatments include angiotensin blockers and beta blockers. Compliance problems include diet and exercise.   Hyperlipidemia This is a chronic problem. The current episode started more than 1 year ago. The problem is controlled. Recent lipid tests were reviewed and are normal. Exacerbating diseases include obesity. Associated symptoms include shortness of breath. Pertinent negatives include no chest pain. Current antihyperlipidemic treatment includes diet change. Risk factors for coronary artery disease include hypertension and obesity.  Anxiety Presents for follow-up visit. Symptoms include nervous/anxious behavior, palpitations and shortness of breath. Patient reports no chest pain or irritability. Symptoms occur rarely. The quality of sleep is good. Nighttime awakenings: occasional.   Compliance with medications is 51-75%.  Hypothyroidism Levothyroxine working well - No c/o side effects Depression Lexapro helps- Keeps her calm GERD Protonix working well - keeps symptoms under control   Review of Systems  Constitutional: Negative for irritability.  Eyes: Negative for blurred vision.  Respiratory: Positive for shortness of breath.   Cardiovascular: Positive for palpitations. Negative for chest pain.  Psychiatric/Behavioral: The patient is nervous/anxious.   All other systems reviewed and are negative.      Objective:   Physical Exam  Constitutional: She is  oriented to person, place, and time. She appears well-developed and well-nourished.  HENT:  Nose: Nose normal.  Mouth/Throat: Oropharynx is clear and moist.  Eyes: EOM are normal.  Neck: Trachea normal, normal range of motion and full passive range of motion without pain. Neck supple. No JVD present. Carotid bruit is not present. No thyromegaly present.  Cardiovascular: Normal rate, regular rhythm, normal heart sounds and intact distal pulses.  Exam reveals no gallop and no friction rub.   No murmur heard. Pulmonary/Chest: Effort normal and breath sounds normal.  Abdominal: Soft. Bowel sounds are normal. She exhibits no distension and no mass. There is no tenderness.  Musculoskeletal: Normal range of motion.  Lymphadenopathy:    She has no cervical adenopathy.  Neurological: She is alert and oriented to person, place, and time. She has normal reflexes.  Skin: Skin is warm and dry.  Psychiatric: She has a normal mood and affect. Her behavior is normal. Judgment and thought content normal.   BP 113/71  Pulse 76  Temp(Src) 97.1 F (36.2 C) (Oral)  Ht 5\' 10"  (1.778 m)  Wt 249 lb (112.946 kg)  BMI 35.73 kg/m2  LMP 05/23/1995        Assessment & Plan:   1. GERD (gastroesophageal reflux disease)   2. Depression   3. Hypothyroidism   4. Hypertension   5. Hyperlipidemia    Orders Placed This Encounter  Procedures  . CMP14+EGFR  . NMR, lipoprofile  . Thyroid Panel With TSH   Meds ordered this encounter  Medications  . escitalopram (LEXAPRO) 10 MG tablet    Sig: Take 1 tablet (10 mg total) by mouth daily.    Dispense:  30 tablet    Refill:  5    Order Specific Question:  Supervising Provider    Answer:  07/24/1995,  DONALD W [[id number]]  . fenofibrate 160 MG tablet    Sig: Take 1 tablet (160 mg total) by mouth daily.    Dispense:  30 tablet    Refill:  5    Order Specific Question:  Supervising Provider    Answer:  Chipper Herb [[id number]]  . metoprolol succinate (TOPROL-XL) 50 MG  24 hr tablet    Sig: Take 1 tablet (50 mg total) by mouth daily.    Dispense:  30 tablet    Refill:  5    Order Specific Question:  Supervising Provider    Answer:  Chipper Herb [[id number]]  . valsartan-hydrochlorothiazide (DIOVAN-HCT) 160-12.5 MG per tablet    Sig: Take 1 tablet by mouth daily.    Dispense:  30 tablet    Refill:  5    Order Specific Question:  Supervising Provider    Answer:  Chipper Herb [[id number]]  . levothyroxine (SYNTHROID, LEVOTHROID) 75 MCG tablet    Sig: Take 1 tablet (75 mcg total) by mouth daily.    Dispense:  90 tablet    Refill:  1    Order Specific Question:  Supervising Provider    Answer:  Chipper Herb [[id number]]  . pantoprazole (PROTONIX) 40 MG tablet    Sig: Take 1 tablet (40 mg total) by mouth as needed.    Dispense:  30 tablet    Refill:  5    Order Specific Question:  Supervising Provider    Answer:  Chipper Herb [[id number]]    Labs pending Health maintenance reviewed Diet and exercise encouraged Continue all meds Follow up  In 3 months   [place], FNP

## 2014-03-18 NOTE — Patient Instructions (Signed)

## 2014-03-19 LAB — CMP14+EGFR
ALK PHOS: 50 IU/L (ref 39–117)
ALT: 20 IU/L (ref 0–32)
AST: 17 IU/L (ref 0–40)
Albumin/Globulin Ratio: 2 (ref 1.1–2.5)
Albumin: 4.5 g/dL (ref 3.5–5.5)
BUN / CREAT RATIO: 16 (ref 9–23)
BUN: 18 mg/dL (ref 6–24)
CALCIUM: 10.2 mg/dL (ref 8.7–10.2)
CO2: 28 mmol/L (ref 18–29)
Chloride: 100 mmol/L (ref 97–108)
Creatinine, Ser: 1.12 mg/dL — ABNORMAL HIGH (ref 0.57–1.00)
GFR calc Af Amer: 65 mL/min/{1.73_m2} (ref >59)
GFR calc non Af Amer: 57 mL/min/{1.73_m2} — ABNORMAL LOW (ref >59)
GLOBULIN, TOTAL: 2.2 g/dL (ref 1.5–4.5)
Glucose: 107 mg/dL — ABNORMAL HIGH (ref 65–99)
POTASSIUM: 3.6 mmol/L (ref 3.5–5.2)
SODIUM: 144 mmol/L (ref 134–144)
Total Bilirubin: <0.2 mg/dL (ref 0.0–1.2)
Total Protein: 6.7 g/dL (ref 6.0–8.5)

## 2014-03-19 LAB — NMR, LIPOPROFILE
Cholesterol: 157 mg/dL (ref <200)
HDL CHOLESTEROL BY NMR: 48 mg/dL (ref >=40)
HDL PARTICLE NUMBER: 41.1 umol/L (ref >=30.5)
LDL Particle Number: 1122 nmol/L — ABNORMAL HIGH (ref <1000)
LDL Size: 20.9 nm (ref >20.5)
LDLC SERPL CALC-MCNC: 76 mg/dL (ref <100)
LP-IR SCORE: 80 — AB (ref <=45)
SMALL LDL PARTICLE NUMBER: 423 nmol/L (ref <=527)
Triglycerides by NMR: 166 mg/dL — ABNORMAL HIGH (ref <150)

## 2014-03-19 LAB — THYROID PANEL WITH TSH
FREE THYROXINE INDEX: 3.2 (ref 1.2–4.9)
T3 Uptake Ratio: 29 % (ref 24–39)
T4 TOTAL: 10.9 ug/dL (ref 4.5–12.0)
TSH: 2.9 u[IU]/mL (ref 0.450–4.500)

## 2014-04-23 ENCOUNTER — Ambulatory Visit: Payer: 59 | Admitting: Nurse Practitioner

## 2014-06-18 ENCOUNTER — Other Ambulatory Visit: Payer: Self-pay

## 2014-06-18 DIAGNOSIS — Z1231 Encounter for screening mammogram for malignant neoplasm of breast: Secondary | ICD-10-CM

## 2014-06-19 ENCOUNTER — Ambulatory Visit (INDEPENDENT_AMBULATORY_CARE_PROVIDER_SITE_OTHER): Payer: 59 | Admitting: Nurse Practitioner

## 2014-06-19 ENCOUNTER — Encounter: Payer: Self-pay | Admitting: Nurse Practitioner

## 2014-06-19 VITALS — BP 103/64 | HR 57 | Temp 97.2°F | Ht 70.0 in | Wt 228.8 lb

## 2014-06-19 DIAGNOSIS — E034 Atrophy of thyroid (acquired): Secondary | ICD-10-CM

## 2014-06-19 DIAGNOSIS — E038 Other specified hypothyroidism: Secondary | ICD-10-CM

## 2014-06-19 DIAGNOSIS — Z713 Dietary counseling and surveillance: Secondary | ICD-10-CM

## 2014-06-19 DIAGNOSIS — F329 Major depressive disorder, single episode, unspecified: Secondary | ICD-10-CM

## 2014-06-19 DIAGNOSIS — I10 Essential (primary) hypertension: Secondary | ICD-10-CM

## 2014-06-19 DIAGNOSIS — F3289 Other specified depressive episodes: Secondary | ICD-10-CM

## 2014-06-19 DIAGNOSIS — E039 Hypothyroidism, unspecified: Secondary | ICD-10-CM

## 2014-06-19 DIAGNOSIS — E785 Hyperlipidemia, unspecified: Secondary | ICD-10-CM

## 2014-06-19 DIAGNOSIS — Z6832 Body mass index (BMI) 32.0-32.9, adult: Secondary | ICD-10-CM

## 2014-06-19 DIAGNOSIS — F32A Depression, unspecified: Secondary | ICD-10-CM

## 2014-06-19 DIAGNOSIS — E0789 Other specified disorders of thyroid: Secondary | ICD-10-CM

## 2014-06-19 DIAGNOSIS — E0500 Thyrotoxicosis with diffuse goiter without thyrotoxic crisis or storm: Secondary | ICD-10-CM

## 2014-06-19 DIAGNOSIS — K219 Gastro-esophageal reflux disease without esophagitis: Secondary | ICD-10-CM

## 2014-06-19 MED ORDER — ESCITALOPRAM OXALATE 10 MG PO TABS: 10.0000 mg | ORAL_TABLET | Freq: Every day | ORAL | Status: DC

## 2014-06-19 MED ORDER — VALSARTAN-HYDROCHLOROTHIAZIDE 160-12.5 MG PO TABS: 1.0000 | ORAL_TABLET | Freq: Every day | ORAL | Status: DC

## 2014-06-19 MED ORDER — CETIRIZINE HCL 10 MG PO TABS: 10.0000 mg | ORAL_TABLET | Freq: Every day | ORAL | Status: DC

## 2014-06-19 MED ORDER — FENOFIBRATE 160 MG PO TABS: 160.0000 mg | ORAL_TABLET | Freq: Every day | ORAL | Status: DC

## 2014-06-19 MED ORDER — PANTOPRAZOLE SODIUM 40 MG PO TBEC: 40.0000 mg | DELAYED_RELEASE_TABLET | ORAL | Status: DC | PRN

## 2014-06-19 MED ORDER — LEVOTHYROXINE SODIUM 75 MCG PO TABS: 75.0000 ug | ORAL_TABLET | Freq: Every day | ORAL | Status: DC

## 2014-06-19 MED ORDER — SCOPOLAMINE 1 MG/3DAYS TD PT72: 1.0000 | MEDICATED_PATCH | TRANSDERMAL | Status: DC

## 2014-06-19 MED ORDER — METOPROLOL SUCCINATE ER 50 MG PO TB24: 50.0000 mg | ORAL_TABLET | Freq: Every day | ORAL | Status: DC

## 2014-06-19 NOTE — Progress Notes (Signed)
Subjective:    Patient ID: Casey [name], female    DOB: 09/18/1961, 53 y.o.   MRN: [id number]  Patient here tday for follow up of chronic medical problems-   Hypertension This is a chronic problem. The current episode started more than 1 year ago. The problem is unchanged. The problem is controlled. Associated symptoms include palpitations and shortness of breath. Pertinent negatives include no anxiety, blurred vision or peripheral edema. Risk factors for coronary artery disease include dyslipidemia and obesity. Past treatments include angiotensin blockers and beta blockers. Compliance problems include diet and exercise.   Hyperlipidemia This is a chronic problem. The current episode started more than 1 year ago. The problem is controlled. Recent lipid tests were reviewed and are normal. Exacerbating diseases include obesity. Associated symptoms include shortness of breath. Current antihyperlipidemic treatment includes diet change. Risk factors for coronary artery disease include hypertension and obesity.  Anxiety Presents for follow-up visit. Symptoms include nervous/anxious behavior, palpitations and shortness of breath. Patient reports no irritability. Symptoms occur rarely. The quality of sleep is good. Nighttime awakenings: occasional.   Compliance with medications is 51-75%.  Hypothyroidism Levothyroxine working well - No c/o side effects Depression Lexapro helps- Keeps her calm GERD Protonix working well - keeps symptoms under control   Review of Systems  Constitutional: Negative for irritability.  Eyes: Negative for blurred vision.  Respiratory: Positive for shortness of breath.   Cardiovascular: Positive for palpitations.  Psychiatric/Behavioral: The patient is nervous/anxious.   All other systems reviewed and are negative.      Objective:   Physical Exam  Constitutional: She is oriented to person, place, and time. She appears well-developed and well-nourished.  HENT:   Nose: Nose normal.  Mouth/Throat: Oropharynx is clear and moist.  Eyes: EOM are normal.  Neck: Trachea normal, normal range of motion and full passive range of motion without pain. Neck supple. No JVD present. Carotid bruit is not present. No thyromegaly present.  Cardiovascular: Normal rate, regular rhythm, normal heart sounds and intact distal pulses.  Exam reveals no gallop and no friction rub.   No murmur heard. Pulmonary/Chest: Effort normal and breath sounds normal.  Abdominal: Soft. Bowel sounds are normal. She exhibits no distension and no mass. There is no tenderness.  Musculoskeletal: Normal range of motion.  Lymphadenopathy:    She has no cervical adenopathy.  Neurological: She is alert and oriented to person, place, and time. She has normal reflexes.  Skin: Skin is warm and dry.  Psychiatric: She has a normal mood and affect. Her behavior is normal. Judgment and thought content normal.   BP 103/64  Pulse 57  Temp(Src) 97.2 F (36.2 C) (Oral)  Ht 5' 10" (1.778 m)  Wt 228 lb 12.8 oz (103.783 kg)  BMI 32.83 kg/m2  LMP 05/23/1995       Assessment & Plan:   1. Hypothyroidism, unspecified hypothyroidism type   2. Essential hypertension   3. Hyperlipidemia   4. Graves' disease   5. Gastroesophageal reflux disease, esophagitis presence not specified   6. Depression   7. Hypothyroidism due to acquired atrophy of thyroid    Orders Placed This Encounter  Procedures  . CMP14+EGFR  . NMR, lipoprofile  . Thyroid Panel With TSH   Meds ordered this encounter  Medications  . escitalopram (LEXAPRO) 10 MG tablet    Sig: Take 1 tablet (10 mg total) by mouth daily.    Dispense:  30 tablet    Refill:  5    Order Specific Question:  Supervising Provider    Answer:  Chipper Herb [[id number]]  . pantoprazole (PROTONIX) 40 MG tablet    Sig: Take 1 tablet (40 mg total) by mouth as needed.    Dispense:  30 tablet    Refill:  5    Order Specific Question:  Supervising Provider     Answer:  Chipper Herb [[id number]]  . fenofibrate 160 MG tablet    Sig: Take 1 tablet (160 mg total) by mouth daily.    Dispense:  30 tablet    Refill:  5    Order Specific Question:  Supervising Provider    Answer:  Chipper Herb [[id number]]  . valsartan-hydrochlorothiazide (DIOVAN-HCT) 160-12.5 MG per tablet    Sig: Take 1 tablet by mouth daily.    Dispense:  30 tablet    Refill:  5    Order Specific Question:  Supervising Provider    Answer:  Chipper Herb [[id number]]  . metoprolol succinate (TOPROL-XL) 50 MG 24 hr tablet    Sig: Take 1 tablet (50 mg total) by mouth daily.    Dispense:  30 tablet    Refill:  5    Order Specific Question:  Supervising Provider    Answer:  Chipper Herb [[id number]]  . levothyroxine (SYNTHROID, LEVOTHROID) 75 MCG tablet    Sig: Take 1 tablet (75 mcg total) by mouth daily.    Dispense:  30 tablet    Refill:  5    Order Specific Question:  Supervising Provider    Answer:  Chipper Herb [[id number]]  . cetirizine (ZYRTEC) 10 MG tablet    Sig: Take 1 tablet (10 mg total) by mouth daily.    Dispense:  30 tablet    Refill:  5    Order Specific Question:  Supervising Provider    Answer:  Chipper Herb [[id number]]  . scopolamine (TRANSDERM-SCOP) 1 MG/3DAYS    Sig: Place 1 patch (1.5 mg total) onto the skin every 3 (three) days.    Dispense:  10 patch    Refill:  0    Order Specific Question:  Supervising Provider    Answer:  Chipper Herb [place] pending Health maintenance reviewed Diet and exercise encouraged Continue all meds Follow up  In 3 months    [place], FNP

## 2014-06-19 NOTE — Patient Instructions (Signed)

## 2014-06-20 LAB — CMP14+EGFR
ALBUMIN: 4.5 g/dL (ref 3.5–5.5)
ALT: 27 IU/L (ref 0–32)
AST: 27 IU/L (ref 0–40)
Albumin/Globulin Ratio: 1.9 (ref 1.1–2.5)
Alkaline Phosphatase: 34 IU/L — ABNORMAL LOW (ref 39–117)
BUN/Creatinine Ratio: 18 (ref 9–23)
BUN: 20 mg/dL (ref 6–24)
CALCIUM: 10.1 mg/dL (ref 8.7–10.2)
CO2: 27 mmol/L (ref 18–29)
CREATININE: 1.11 mg/dL — AB (ref 0.57–1.00)
Chloride: 100 mmol/L (ref 97–108)
GFR calc Af Amer: 66 mL/min/{1.73_m2} (ref >59)
GFR calc non Af Amer: 57 mL/min/{1.73_m2} — ABNORMAL LOW (ref >59)
GLOBULIN, TOTAL: 2.4 g/dL (ref 1.5–4.5)
GLUCOSE: 91 mg/dL (ref 65–99)
Potassium: 4.3 mmol/L (ref 3.5–5.2)
Sodium: 143 mmol/L (ref 134–144)
TOTAL PROTEIN: 6.9 g/dL (ref 6.0–8.5)
Total Bilirubin: 0.3 mg/dL (ref 0.0–1.2)

## 2014-06-20 LAB — NMR, LIPOPROFILE
CHOLESTEROL: 152 mg/dL (ref 100–199)
HDL CHOLESTEROL BY NMR: 51 mg/dL (ref >39)
HDL Particle Number: 36.6 umol/L (ref >=30.5)
LDL PARTICLE NUMBER: 1201 nmol/L — AB (ref <1000)
LDL Size: 20.8 nm (ref >20.5)
LDLC SERPL CALC-MCNC: 83 mg/dL (ref 0–99)
LP-IR Score: 53 — ABNORMAL HIGH (ref <=45)
Small LDL Particle Number: 691 nmol/L — ABNORMAL HIGH (ref <=527)
Triglycerides by NMR: 88 mg/dL (ref 0–149)

## 2014-06-20 LAB — THYROID PANEL WITH TSH
FREE THYROXINE INDEX: 2.8 (ref 1.2–4.9)
T3 UPTAKE RATIO: 28 % (ref 24–39)
T4, Total: 10 ug/dL (ref 4.5–12.0)
TSH: 2.6 u[IU]/mL (ref 0.450–4.500)

## 2014-06-27 ENCOUNTER — Encounter: Payer: Self-pay | Admitting: Certified Nurse Midwife

## 2014-06-27 ENCOUNTER — Ambulatory Visit (INDEPENDENT_AMBULATORY_CARE_PROVIDER_SITE_OTHER): Payer: 59 | Admitting: Certified Nurse Midwife

## 2014-06-27 ENCOUNTER — Ambulatory Visit
Admission: RE | Admit: 2014-06-27 | Discharge: 2014-06-27 | Disposition: A | Discharge status: Home / Self Care | Payer: 59 | Source: Ambulatory Visit

## 2014-06-27 VITALS — BP 108/62 | HR 68 | Resp 16 | Ht 69.75 in | Wt 228.0 lb

## 2014-06-27 DIAGNOSIS — Z01419 Encounter for gynecological examination (general) (routine) without abnormal findings: Secondary | ICD-10-CM

## 2014-06-27 DIAGNOSIS — Z1231 Encounter for screening mammogram for malignant neoplasm of breast: Secondary | ICD-10-CM

## 2014-06-27 IMAGING — MG MM SCREEN MAMMOGRAM BILATERAL
4 series · 4 of 4 positions shown · non-contrast
Comparison: Previous exam(s).

CLINICAL DATA: Screening.

EXAM:
DIGITAL SCREENING BILATERAL MAMMOGRAM WITH CAD

[R CC]
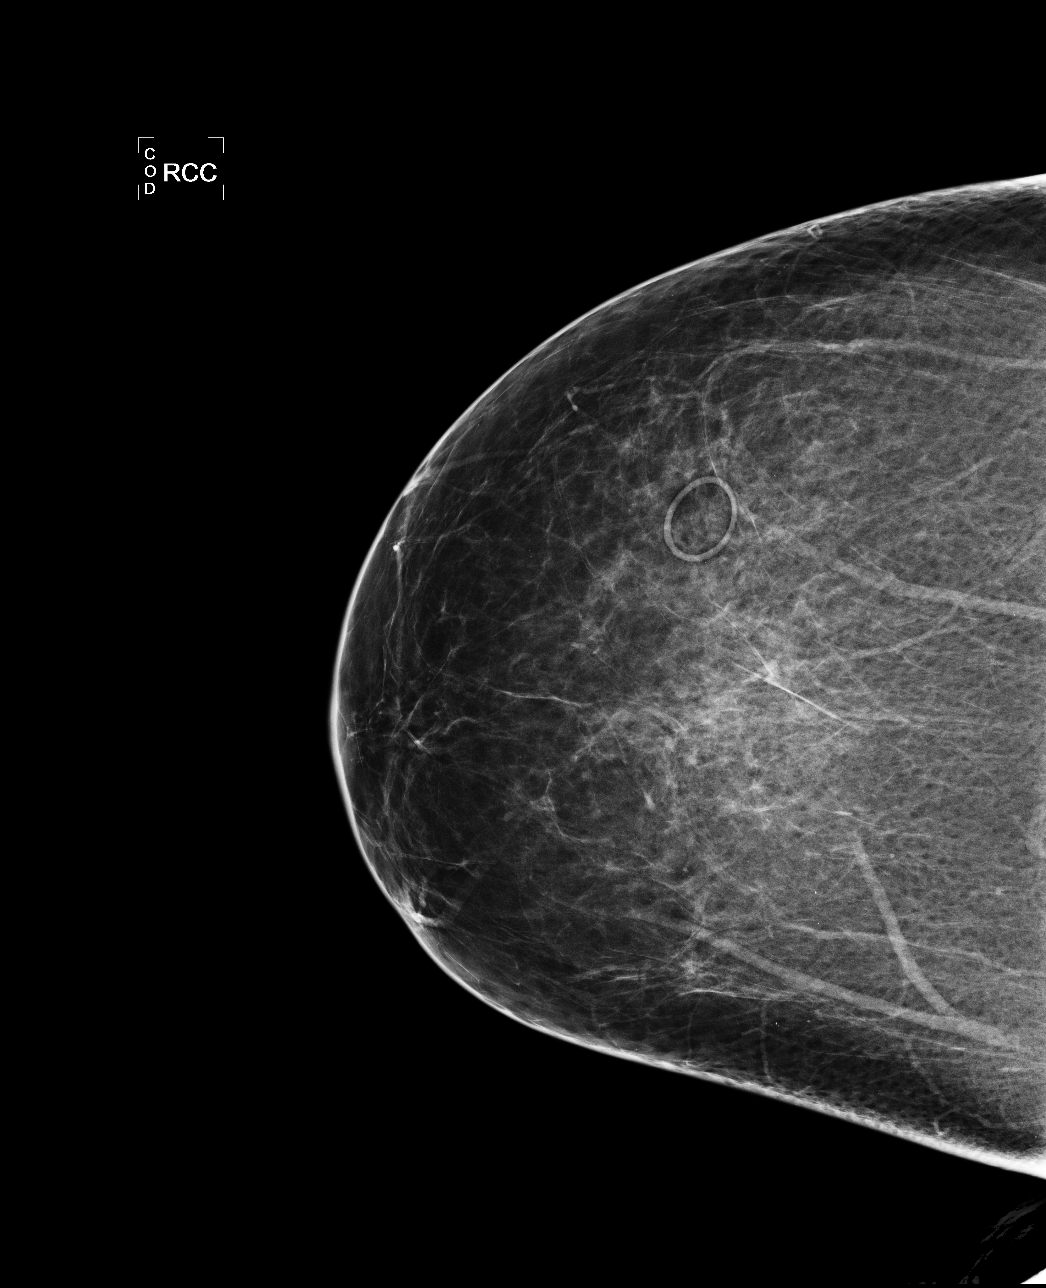

[L CC]
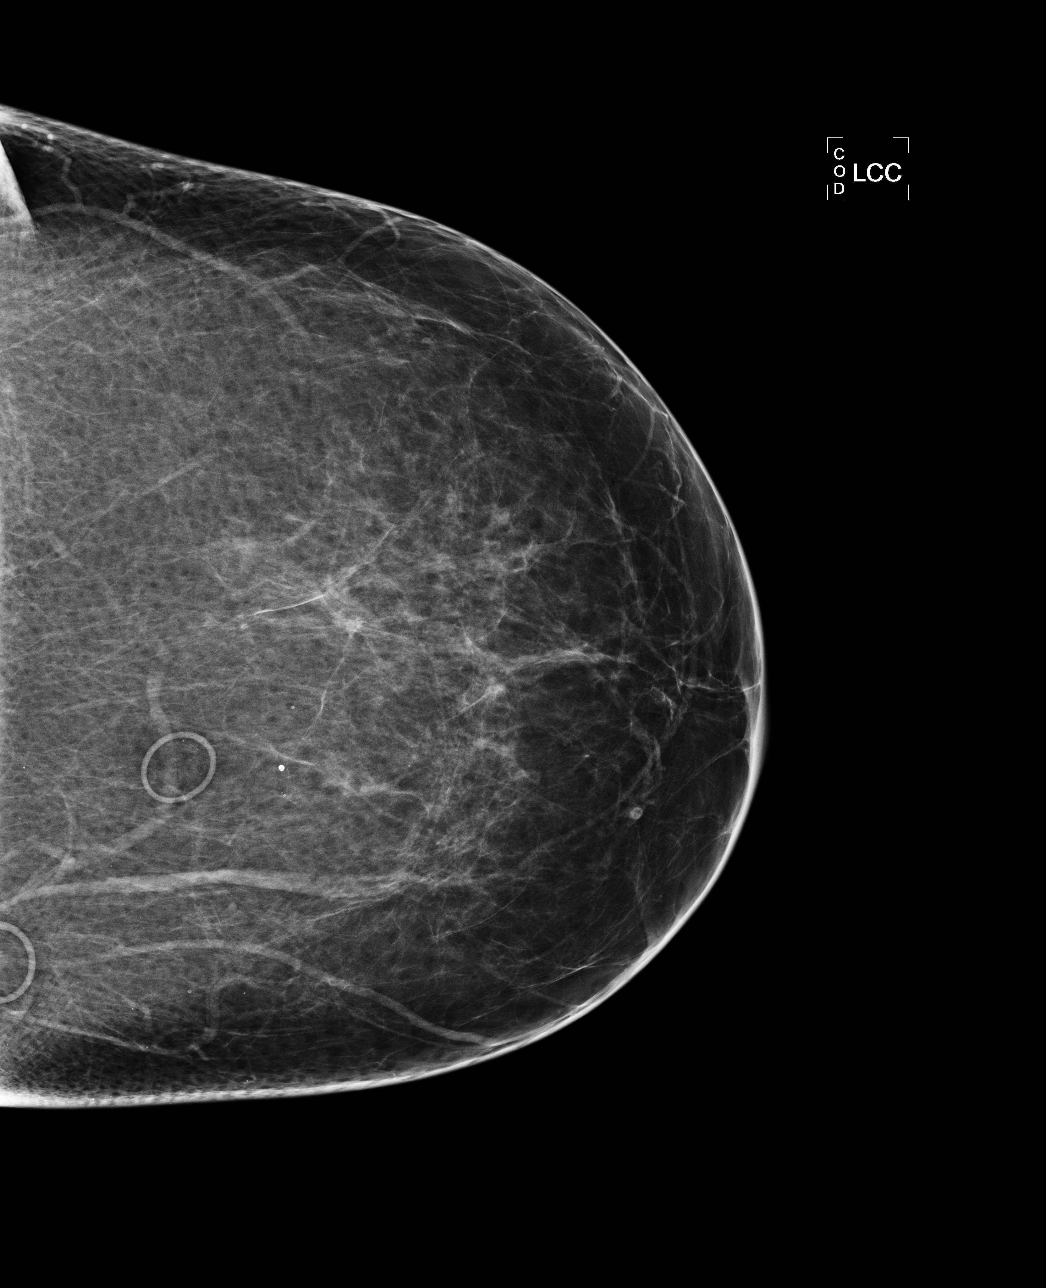

[L MLO]
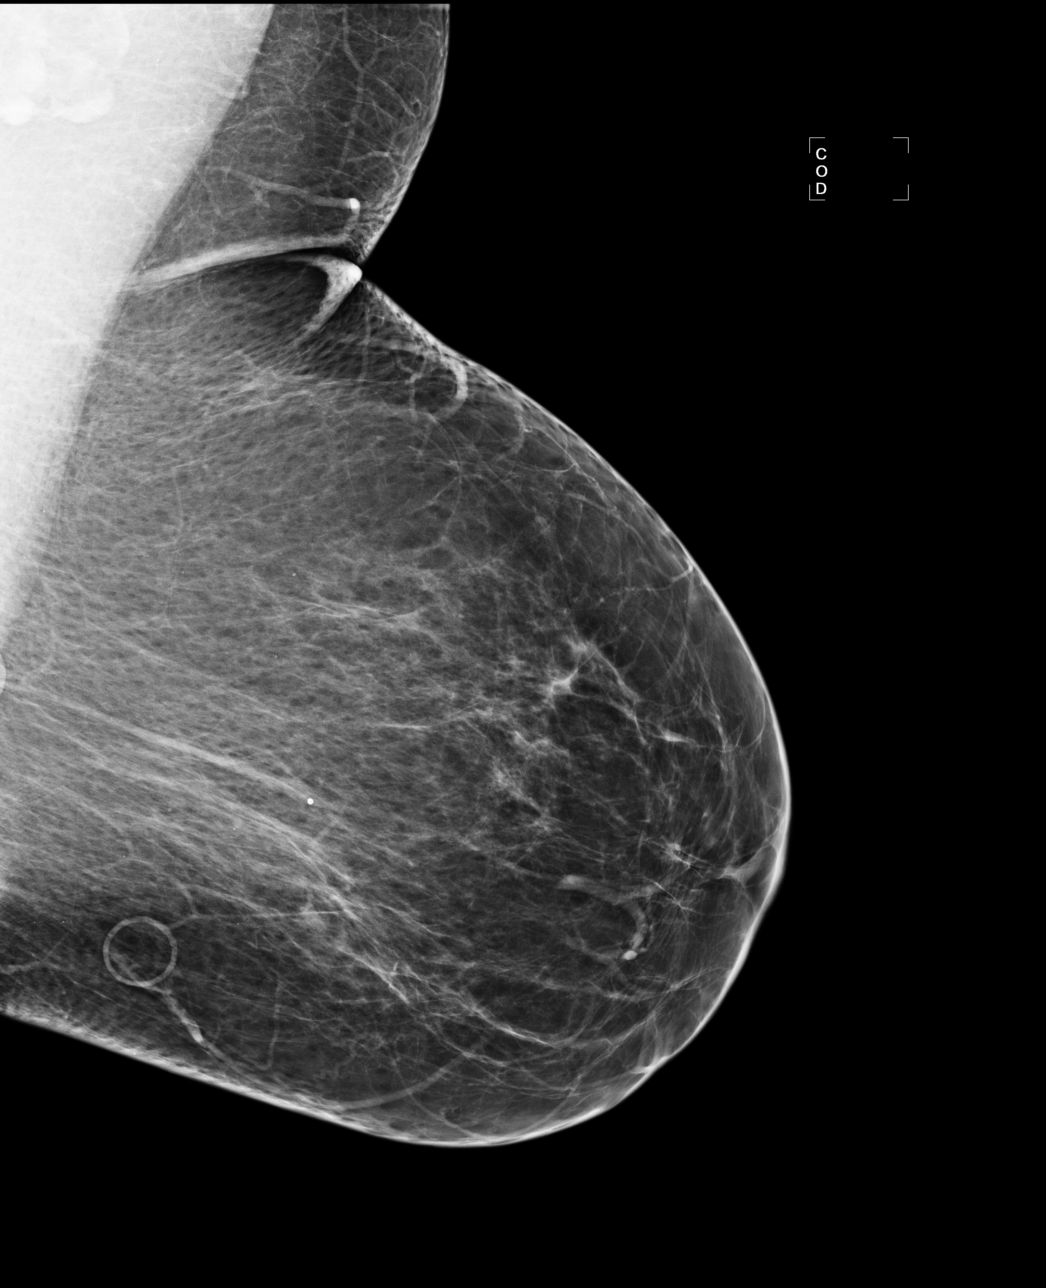

[R MLO]
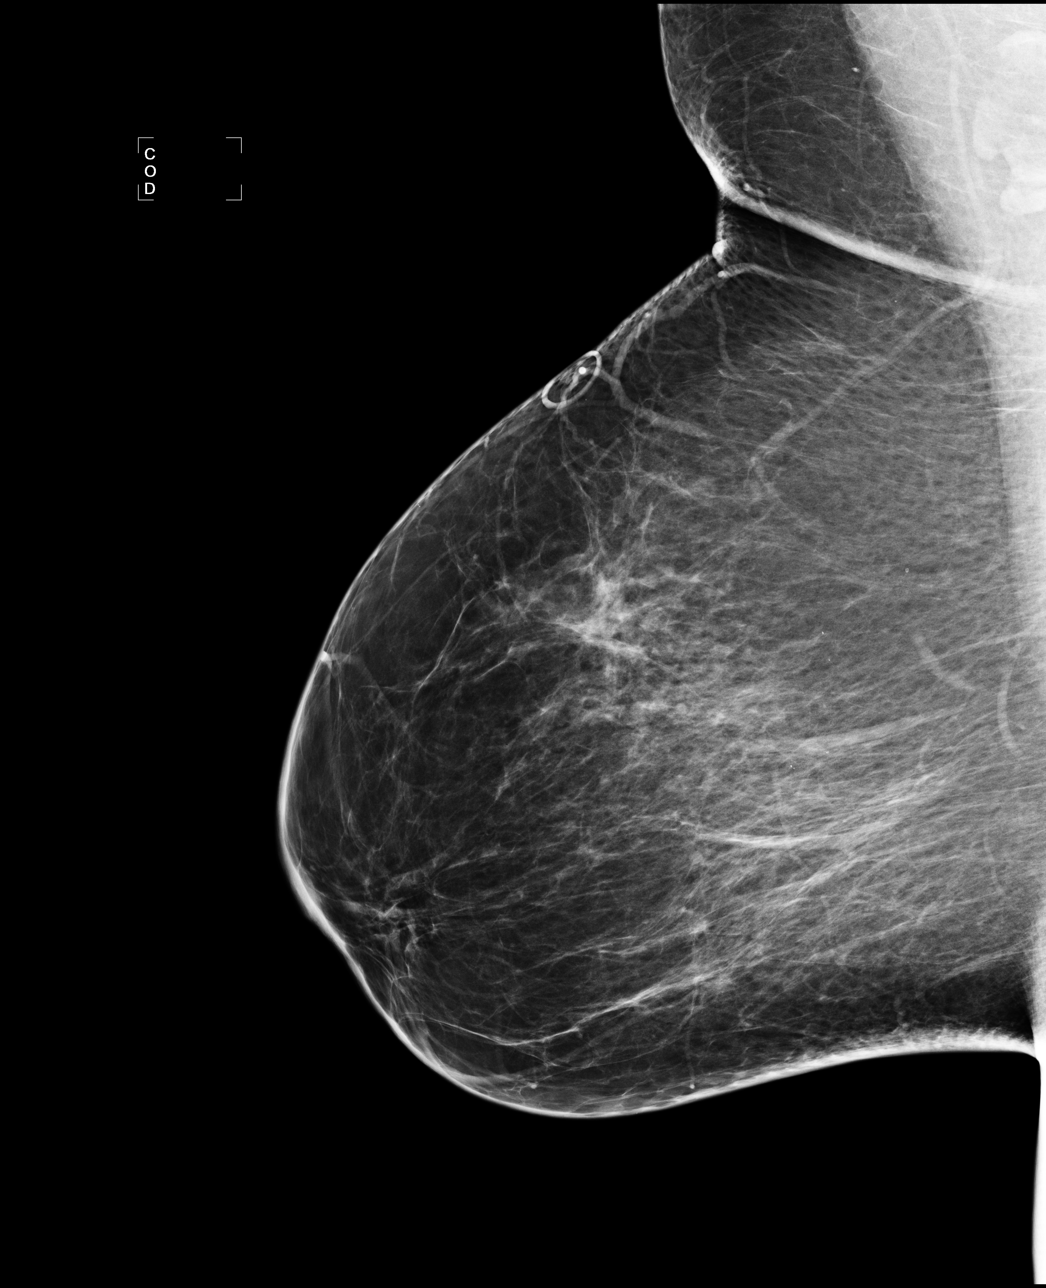

[4 of 4 positions shown; findings below may reference images not displayed]

ACR Breast Density Category b: There are scattered areas of
fibroglandular density.
FINDINGS: There are no findings suspicious for malignancy. Images were
processed with CAD.
IMPRESSION: No mammographic evidence of malignancy. A result letter of this
screening mammogram will be mailed directly to the patient.

RECOMMENDATION:
Screening mammogram in one year. (Code:[US])

BI-RADS CATEGORY  1: Negative.

## 2014-06-27 NOTE — Patient Instructions (Signed)

## 2014-06-27 NOTE — Progress Notes (Signed)
53 y.o. G20P3003 Married Caucasian Fe here for annual exam. Menopausal no HRT. Denies vaginal bleeding or vaginal dryness. Sees PCP for medication management of hypertension/hypothyroid and anxiety., aex, labs.No health issues today. Getting ready for cruise with all her family!  Patient's last menstrual period was 05/23/1995.          Sexually active: Yes.    The current method of family planning is status post hysterectomy.    Exercising: Yes.    walk,lifting & doing work at home Smoker:  no  Health Maintenance: Pap: 06-10-10 neg MMG: 06-26-13 density category b scheduled today,  had rt breast mammo 07-19-13 birads category 1:neg Colonoscopy: none BMD:   none TDaP:  2007 Labs: none Self breast exam: done occ   reports that she has never smoked. She does not have any smokeless tobacco history on file. She reports that she does not drink alcohol or use illicit drugs.  Past Medical History  Diagnosis Date  . Grave's disease   . DUB (dysfunctional uterine bleeding)   . Palpitations   . Hypertension   . Hyperlipidemia   . Depression   . GERD (gastroesophageal reflux disease)   . Migraines     Past Surgical History  Procedure Laterality Date  . Vesicovaginal fistula closure w/ tah      Dr. Margaretha Glassing  2 degree DUB   . Tonsillectomy    . Vaginal hysterectomy      Current Outpatient Prescriptions  Medication Sig Dispense Refill  . cetirizine (ZYRTEC) 10 MG tablet Take 1 tablet (10 mg total) by mouth daily.  30 tablet  5  . escitalopram (LEXAPRO) 10 MG tablet Take 1 tablet (10 mg total) by mouth daily.  30 tablet  5  . fenofibrate 160 MG tablet Take 1 tablet (160 mg total) by mouth daily.  30 tablet  5  . levothyroxine (SYNTHROID, LEVOTHROID) 75 MCG tablet Take 1 tablet (75 mcg total) by mouth daily.  30 tablet  5  . metoprolol succinate (TOPROL-XL) 50 MG 24 hr tablet Take 1 tablet (50 mg total) by mouth daily.  30 tablet  5  . pantoprazole (PROTONIX) 40 MG tablet Take 1 tablet (40  mg total) by mouth as needed.  30 tablet  5  . valsartan-hydrochlorothiazide (DIOVAN-HCT) 160-12.5 MG per tablet Take 1 tablet by mouth daily.  30 tablet  5  . scopolamine (TRANSDERM-SCOP) 1 MG/3DAYS Place 1 patch (1.5 mg total) onto the skin every 3 (three) days.  10 patch  0   No current facility-administered medications for this visit.    Family History  Problem Relation Age of Onset  . Hypertension Mother   . Irregular heart beat Mother   . Thyroid disease Maternal Grandmother   . Cancer Maternal Grandmother     lung  . Cancer Paternal Grandfather     lung    ROS:  Pertinent items are noted in HPI.  Otherwise, a comprehensive ROS was negative.  Exam:   BP 108/62  Pulse 68  Resp 16  Ht 5' 9.75" (1.772 m)  Wt 228 lb (103.42 kg)  BMI 32.94 kg/m2  LMP 05/23/1995 Height: 5' 9.75" (177.2 cm)  Ht Readings from Last 3 Encounters:  06/27/14 5' 9.75" (1.772 m)  06/19/14 5\' 10"  (1.778 m)  03/18/14 5\' 10"  (1.778 m)    General appearance: alert, cooperative and appears stated age Head: Normocephalic, without obvious abnormality, atraumatic Neck: no adenopathy, supple, symmetrical, trachea midline and thyroid normal to inspection and palpation and  non-palpable Lungs: clear to auscultation bilaterally Breasts: normal appearance, no masses or tenderness, No nipple retraction or dimpling, No nipple discharge or bleeding, No axillary or supraclavicular adenopathy Heart: regular rate and rhythm Abdomen: soft, non-tender; no masses,  no organomegaly Extremities: extremities normal, atraumatic, no cyanosis or edema Skin: Skin color, texture, turgor normal. No rashes or lesions Lymph nodes: Cervical, supraclavicular, and axillary nodes normal. No abnormal inguinal nodes palpated Neurologic: Grossly normal   Pelvic: External genitalia:  no lesions              Urethra:  normal appearing urethra with no masses, tenderness or lesions              Bartholin's and Skene's: normal                  Vagina: normal appearing vagina with normal color and discharge, no lesions              Cervix: absent              Pap taken: No. Bimanual Exam:  Uterus:  uterus absent              Adnexa: normal adnexa and no mass, fullness, tenderness               Rectovaginal: Confirms               Anus:  normal sphincter tone, no lesions  A:  Well Woman with normal exam  Menopausal no HRT S/P TVH for menorrhagia  Hypertension/Hypothyroid with PCP management  Colonoscopy due  P:   Reviewed health and wellness pertinent to exam  Pap smear not taken today  Continue follow up as indicated  Discussed risks and benefits, plans to schedule on holiday break.  counseled on breast self exam, mammography screening, adequate intake of calcium and vitamin D, diet and exercise  return annually or prn  An After Visit Summary was printed and given to the patient.

## 2014-06-27 NOTE — Progress Notes (Signed)
Reviewed personally.  M. Suzanne Jawana Reagor, MD.  

## 2014-09-02 ENCOUNTER — Ambulatory Visit (INDEPENDENT_AMBULATORY_CARE_PROVIDER_SITE_OTHER): Payer: 59 | Admitting: Nurse Practitioner

## 2014-09-02 ENCOUNTER — Encounter: Payer: Self-pay | Admitting: Nurse Practitioner

## 2014-09-02 VITALS — BP 111/71 | HR 63 | Temp 98.0°F | Ht 69.75 in | Wt 221.4 lb

## 2014-09-02 DIAGNOSIS — E0500 Thyrotoxicosis with diffuse goiter without thyrotoxic crisis or storm: Secondary | ICD-10-CM

## 2014-09-02 DIAGNOSIS — R5383 Other fatigue: Secondary | ICD-10-CM

## 2014-09-02 DIAGNOSIS — K219 Gastro-esophageal reflux disease without esophagitis: Secondary | ICD-10-CM

## 2014-09-02 DIAGNOSIS — Z23 Encounter for immunization: Secondary | ICD-10-CM

## 2014-09-02 DIAGNOSIS — I10 Essential (primary) hypertension: Secondary | ICD-10-CM

## 2014-09-02 DIAGNOSIS — F32A Depression, unspecified: Secondary | ICD-10-CM

## 2014-09-02 DIAGNOSIS — E559 Vitamin D deficiency, unspecified: Secondary | ICD-10-CM

## 2014-09-02 DIAGNOSIS — E785 Hyperlipidemia, unspecified: Secondary | ICD-10-CM

## 2014-09-02 DIAGNOSIS — F329 Major depressive disorder, single episode, unspecified: Secondary | ICD-10-CM

## 2014-09-02 DIAGNOSIS — E034 Atrophy of thyroid (acquired): Secondary | ICD-10-CM

## 2014-09-02 DIAGNOSIS — E038 Other specified hypothyroidism: Secondary | ICD-10-CM

## 2014-09-02 NOTE — Progress Notes (Signed)
Subjective:    Patient ID: Casey [name], female    DOB: 1961/02/14, 53 y.o.   MRN: [id number]  Patient here tday for follow up of chronic medical problems- She is c/o feeling tired all the time and hair loss.   Hypertension This is a chronic problem. The current episode started more than 1 year ago. The problem is unchanged. The problem is controlled. Associated symptoms include shortness of breath. Pertinent negatives include no anxiety, blurred vision or peripheral edema. Risk factors for coronary artery disease include dyslipidemia and obesity. Past treatments include angiotensin blockers and beta blockers. Compliance problems include diet and exercise.   Hyperlipidemia This is a chronic problem. The current episode started more than 1 year ago. The problem is controlled. Recent lipid tests were reviewed and are normal. Exacerbating diseases include obesity. Associated symptoms include shortness of breath. Current antihyperlipidemic treatment includes diet change. Risk factors for coronary artery disease include hypertension and obesity.  Anxiety Presents for follow-up visit. Symptoms include nervous/anxious behavior and shortness of breath. Patient reports no irritability. Symptoms occur rarely. The quality of sleep is good. Nighttime awakenings: occasional.   Compliance with medications is 51-75%.  Hypothyroidism Levothyroxine working well - No c/o side effects Depression Lexapro helps- Keeps her calm GERD Protonix working well - keeps symptoms under control   Review of Systems  Constitutional: Negative for irritability.  Eyes: Negative for blurred vision.  Respiratory: Positive for shortness of breath.   Psychiatric/Behavioral: The patient is nervous/anxious.   All other systems reviewed and are negative.      Objective:   Physical Exam  Constitutional: She is oriented to person, place, and time. She appears well-developed and well-nourished.  HENT:  Nose: Nose normal.   Mouth/Throat: Oropharynx is clear and moist.  Eyes: EOM are normal.  Neck: Trachea normal, normal range of motion and full passive range of motion without pain. Neck supple. No JVD present. Carotid bruit is not present. No thyromegaly present.  Cardiovascular: Normal rate, regular rhythm, normal heart sounds and intact distal pulses.  Exam reveals no gallop and no friction rub.   No murmur heard. Pulmonary/Chest: Effort normal and breath sounds normal.  Abdominal: Soft. Bowel sounds are normal. She exhibits no distension and no mass. There is no tenderness.  Musculoskeletal: Normal range of motion.  Lymphadenopathy:    She has no cervical adenopathy.  Neurological: She is alert and oriented to person, place, and time. She has normal reflexes.  Skin: Skin is warm and dry.  Psychiatric: She has a normal mood and affect. Her behavior is normal. Judgment and thought content normal.   BP 111/71  Pulse 63  Temp(Src) 98 F (36.7 C) (Oral)  Ht 5' 9.75" (1.772 m)  Wt 221 lb 6.4 oz (100.426 kg)  BMI 31.98 kg/m2  LMP 05/23/1995       Assessment & Plan:  1. Hypothyroidism due to acquired atrophy of thyroid - Thyroid Panel With TSH  2. Essential hypertension No NA+ in diet - CMP14+EGFR  3. Hyperlipidemia Low fat diet - NMR, lipoprofile  4. Graves' disease   5. Gastroesophageal reflux disease without esophagitis Do not eat 2 hours prior to bedtime  6. Depression Stress amangement  7. Fatigue due to depression - Anemia Profile B  8. Vitamin D deficiency - Vit D  25 hydroxy (rtn osteoporosis monitoring)  Flu shot today Labs pending Health maintenance reviewed Diet and exercise encouraged Continue all meds Follow up  In 6 months   [place], FNP

## 2014-09-02 NOTE — Patient Instructions (Signed)

## 2014-09-03 LAB — CMP14+EGFR
A/G RATIO: 2 (ref 1.1–2.5)
ALBUMIN: 4.5 g/dL (ref 3.5–5.5)
ALT: 21 IU/L (ref 0–32)
AST: 20 IU/L (ref 0–40)
Alkaline Phosphatase: 36 IU/L — ABNORMAL LOW (ref 39–117)
BILIRUBIN TOTAL: 0.3 mg/dL (ref 0.0–1.2)
BUN/Creatinine Ratio: 20 (ref 9–23)
BUN: 20 mg/dL (ref 6–24)
CALCIUM: 9.7 mg/dL (ref 8.7–10.2)
CO2: 29 mmol/L (ref 18–29)
CREATININE: 0.99 mg/dL (ref 0.57–1.00)
Chloride: 99 mmol/L (ref 97–108)
GFR, EST AFRICAN AMERICAN: 76 mL/min/{1.73_m2} (ref >59)
GFR, EST NON AFRICAN AMERICAN: 66 mL/min/{1.73_m2} (ref >59)
GLOBULIN, TOTAL: 2.2 g/dL (ref 1.5–4.5)
GLUCOSE: 85 mg/dL (ref 65–99)
Potassium: 3.7 mmol/L (ref 3.5–5.2)
Sodium: 142 mmol/L (ref 134–144)
TOTAL PROTEIN: 6.7 g/dL (ref 6.0–8.5)

## 2014-09-03 LAB — ANEMIA PROFILE B
BASOS: 1 %
Basophils Absolute: 0.1 10*3/uL (ref 0.0–0.2)
Eos: 3 %
Eosinophils Absolute: 0.3 10*3/uL (ref 0.0–0.4)
Ferritin: 203 ng/mL — ABNORMAL HIGH (ref 15–150)
Folate: 6.2 ng/mL (ref >3.0)
HEMATOCRIT: 37.5 % (ref 34.0–46.6)
HEMOGLOBIN: 12.8 g/dL (ref 11.1–15.9)
IRON SATURATION: 15 % (ref 15–55)
Immature Grans (Abs): 0 10*3/uL (ref 0.0–0.1)
Immature Granulocytes: 0 %
Iron: 62 ug/dL (ref 35–155)
LYMPHS: 36 %
Lymphocytes Absolute: 3.4 10*3/uL — ABNORMAL HIGH (ref 0.7–3.1)
MCH: 29.7 pg (ref 26.6–33.0)
MCHC: 34.1 g/dL (ref 31.5–35.7)
MCV: 87 fL (ref 79–97)
MONOCYTES: 9 %
Monocytes Absolute: 0.8 10*3/uL (ref 0.1–0.9)
NEUTROS ABS: 4.8 10*3/uL (ref 1.4–7.0)
Neutrophils Relative %: 51 %
Platelets: 278 10*3/uL (ref 150–379)
RBC: 4.31 x10E6/uL (ref 3.77–5.28)
RDW: 13 % (ref 12.3–15.4)
RETIC CT PCT: 1.3 % (ref 0.6–2.6)
TIBC: 406 ug/dL (ref 250–450)
UIBC: 344 ug/dL (ref 150–375)
Vitamin B-12: 337 pg/mL (ref 211–946)
WBC: 9.4 10*3/uL (ref 3.4–10.8)

## 2014-09-03 LAB — NMR, LIPOPROFILE
Cholesterol: 149 mg/dL (ref 100–199)
HDL Cholesterol by NMR: 52 mg/dL (ref >39)
HDL Particle Number: 39.9 umol/L (ref >=30.5)
LDL Particle Number: 1049 nmol/L — ABNORMAL HIGH (ref <1000)
LDL Size: 20.3 nm (ref >20.5)
LDLC SERPL CALC-MCNC: 77 mg/dL (ref 0–99)
LP-IR Score: 60 — ABNORMAL HIGH (ref <=45)
SMALL LDL PARTICLE NUMBER: 661 nmol/L — AB (ref <=527)
Triglycerides by NMR: 98 mg/dL (ref 0–149)

## 2014-09-03 LAB — VITAMIN D 25 HYDROXY (VIT D DEFICIENCY, FRACTURES): Vit D, 25-Hydroxy: 38.7 ng/mL (ref 30.0–100.0)

## 2014-09-03 LAB — THYROID PANEL WITH TSH
FREE THYROXINE INDEX: 3 (ref 1.2–4.9)
T3 UPTAKE RATIO: 29 % (ref 24–39)
T4, Total: 10.4 ug/dL (ref 4.5–12.0)
TSH: 2.4 u[IU]/mL (ref 0.450–4.500)

## 2014-09-23 ENCOUNTER — Encounter: Payer: Self-pay | Admitting: Nurse Practitioner

## 2014-12-02 ENCOUNTER — Telehealth: Payer: Self-pay | Admitting: Nurse Practitioner

## 2014-12-02 ENCOUNTER — Telehealth: Payer: Self-pay | Admitting: *Deleted

## 2014-12-02 MED ORDER — SCOPOLAMINE 1 MG/3DAYS TD PT72: 1.0000 | MEDICATED_PATCH | TRANSDERMAL | Status: DC

## 2014-12-02 NOTE — Telephone Encounter (Signed)
scopalamoine patches rx sent to pharamcy

## 2014-12-02 NOTE — Telephone Encounter (Signed)
Check with your pharmacy.  Per provider, script for scopolamine patches sent in.

## 2015-02-10 ENCOUNTER — Ambulatory Visit (INDEPENDENT_AMBULATORY_CARE_PROVIDER_SITE_OTHER): Payer: 59

## 2015-02-10 ENCOUNTER — Ambulatory Visit (INDEPENDENT_AMBULATORY_CARE_PROVIDER_SITE_OTHER): Payer: 59 | Admitting: Physician Assistant

## 2015-02-10 ENCOUNTER — Encounter: Payer: Self-pay | Admitting: Physician Assistant

## 2015-02-10 VITALS — BP 113/76 | HR 59 | Temp 97.7°F | Ht 69.0 in | Wt 225.0 lb

## 2015-02-10 DIAGNOSIS — R1031 Right lower quadrant pain: Secondary | ICD-10-CM

## 2015-02-10 DIAGNOSIS — K5909 Other constipation: Secondary | ICD-10-CM | POA: Diagnosis not present

## 2015-02-10 DIAGNOSIS — N3090 Cystitis, unspecified without hematuria: Secondary | ICD-10-CM | POA: Diagnosis not present

## 2015-02-10 DIAGNOSIS — R300 Dysuria: Secondary | ICD-10-CM

## 2015-02-10 LAB — POCT CBC
Granulocyte percent: 59.6 %G (ref 37–80)
HCT, POC: 37.1 % — AB (ref 37.7–47.9)
Hemoglobin: 11.7 g/dL — AB (ref 12.2–16.2)
LYMPH, POC: 3.3 (ref 0.6–3.4)
MCH: 27.3 pg (ref 27–31.2)
MCHC: 31.6 g/dL — AB (ref 31.8–35.4)
MCV: 86.4 fL (ref 80–97)
MPV: 9 fL (ref 0–99.8)
PLATELET COUNT, POC: 310 10*3/uL (ref 142–424)
POC Granulocyte: 5.8 (ref 2–6.9)
POC LYMPH PERCENT: 34.1 %L (ref 10–50)
RBC: 4.29 M/uL (ref 4.04–5.48)
RDW, POC: 12.4 %
WBC: 9.7 10*3/uL (ref 4.6–10.2)

## 2015-02-10 LAB — POCT UA - MICROSCOPIC ONLY
CASTS, UR, LPF, POC: NEGATIVE
CRYSTALS, UR, HPF, POC: NEGATIVE
Mucus, UA: NEGATIVE
Yeast, UA: NEGATIVE

## 2015-02-10 LAB — POCT URINALYSIS DIPSTICK

## 2015-02-10 IMAGING — CR DG ABDOMEN 1V
1 series · 1 of 1 positions shown · non-contrast
Comparison: None.

CLINICAL DATA: Right lower quadrant pain.

EXAM:
ABDOMEN - 1 VIEW

[view not recorded]
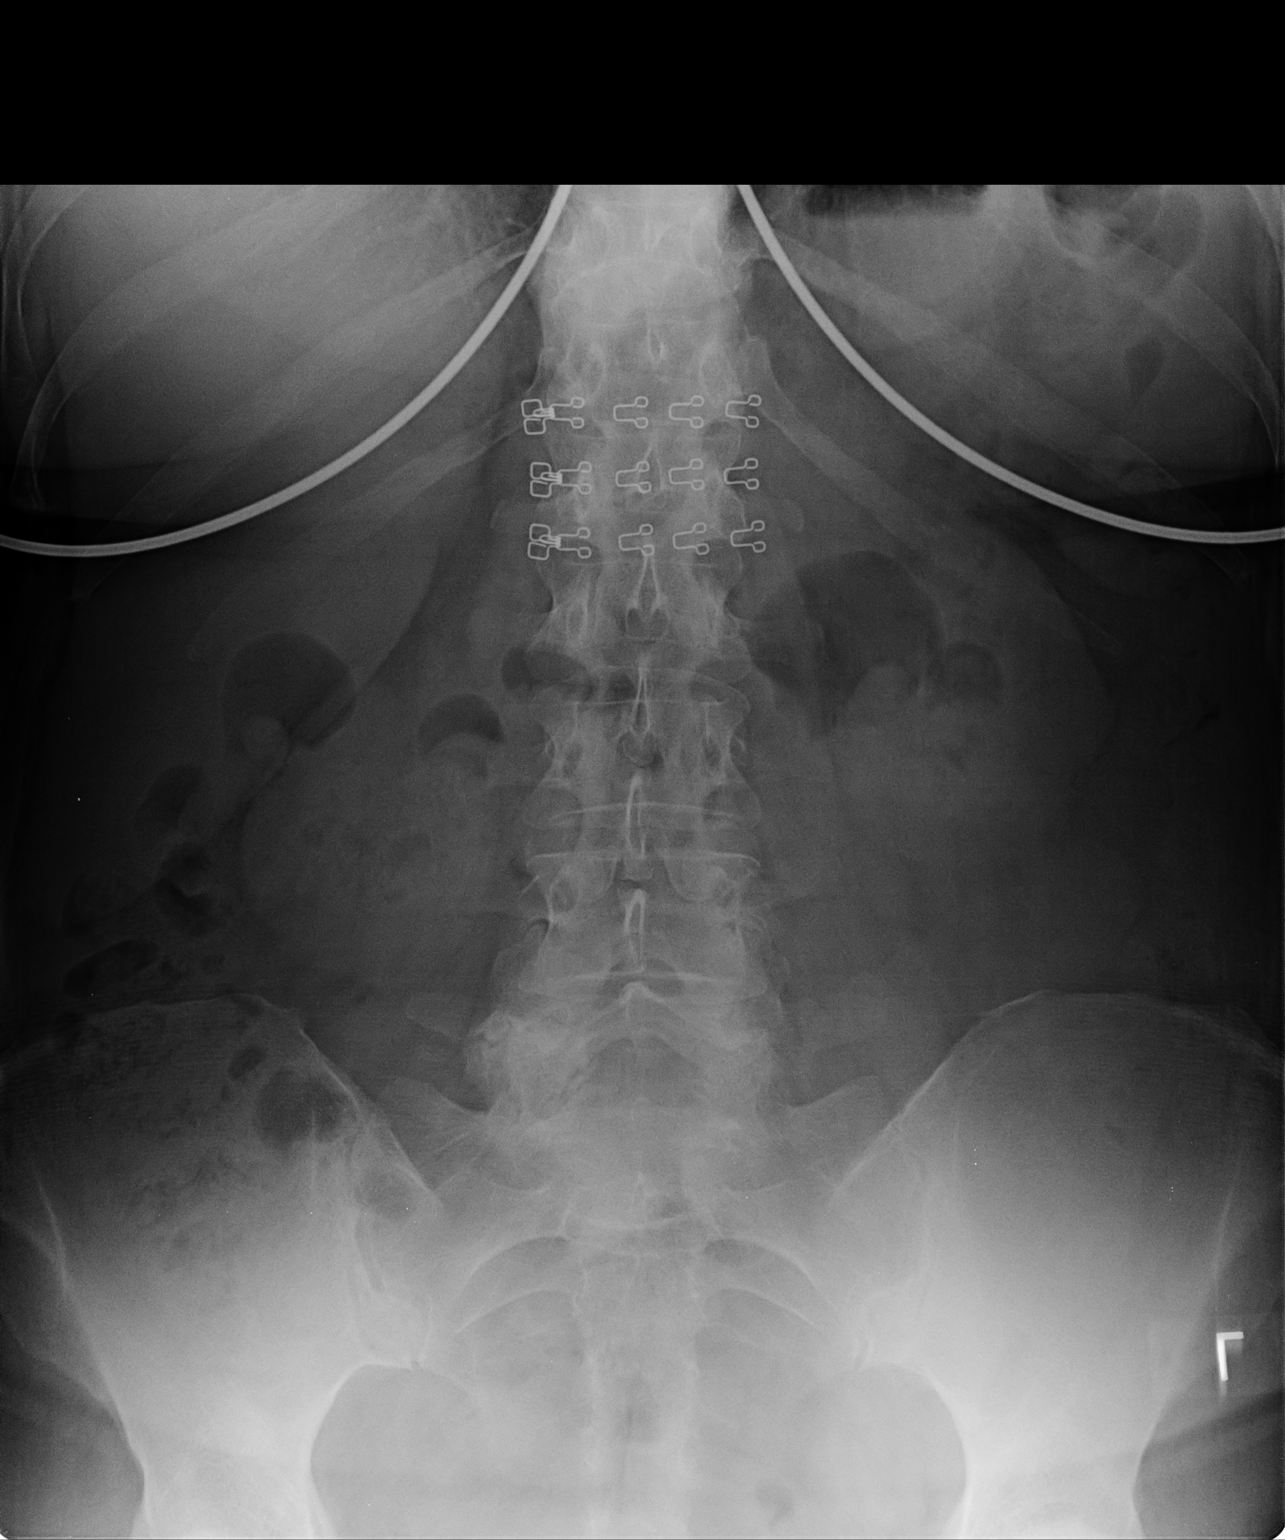

[1 of 1 positions shown; findings below may reference images not displayed]

FINDINGS: Soft tissue structures are unremarkable. No bowel distention. Stool
noted throughout the colon. No free air. No acute bony abnormality .
IMPRESSION: No acute abnormality.

## 2015-02-10 MED ORDER — NITROFURANTOIN MONOHYD MACRO 100 MG PO CAPS: 100.0000 mg | ORAL_CAPSULE | Freq: Two times a day (BID) | ORAL | Status: DC

## 2015-02-10 MED ORDER — POLYETHYLENE GLYCOL 3350 17 GM/SCOOP PO POWD: 17.0000 g | Freq: Every day | ORAL | Status: DC

## 2015-02-10 NOTE — Progress Notes (Signed)
   Subjective:    Patient ID: Casey [name], female    DOB: Dec 06, 1960, 54 y.o.   MRN: [id number]  Abdominal Pain Associated symptoms include constipation (more over the past few weeks. ), dysuria, a fever (low grade) and nausea. Pertinent negatives include no diarrhea, frequency, hematuria or vomiting.  Sinusitis Associated symptoms include chills. Pertinent negatives include no diaphoresis.   54 y/o female presetns with c/o RLQ pain x 4-5 days. Dexcribes as "cramping" . Worse with urination. No hematuria, polyuria, burning. Noticed strong odor. Similar to previous UTI's. Had taken ibuprofen and Azo with some relief.     Review of Systems  Constitutional: Positive for fever (low grade) and chills. Negative for diaphoresis and fatigue.  Gastrointestinal: Positive for nausea, abdominal pain (RLQ and periumbilical pain) and constipation (more over the past few weeks. ). Negative for vomiting and diarrhea.  Genitourinary: Positive for dysuria and pelvic pain (RLQ). Negative for frequency, hematuria, vaginal bleeding, vaginal discharge, vaginal pain and menstrual problem (hx of hysterectomy 10+ years ago).       Objective:   Physical Exam  Constitutional: She is oriented to person, place, and time. She appears well-developed and well-nourished. No distress.  HENT:  Right Ear: External ear normal.  Left Ear: External ear normal.  Mouth/Throat: No oropharyngeal exudate.  Abdominal: Soft. Bowel sounds are normal. There is tenderness (RLQ) in the right lower quadrant. There is guarding and tenderness at McBurney's point. There is no rebound and no CVA tenderness.  Neurological: She is alert and oriented to person, place, and time. She has normal reflexes.  Skin: She is not diaphoretic.  Psychiatric: She has a normal mood and affect. Her behavior is normal. Judgment normal.  Vitals reviewed.         Assessment & Plan:  1. Cystitis: Macrobid 100mg  BID x 5 days. May continue to take Azo 2.  RLQ pain: CBC to determine if leukocytosis indicating possible appendicitis 3. Constipation: KUB to access for impaction, Miralax daily as directed  Have patient f/u in 1 week for reassessment. If pain significantly worsens, fever develops, nausea/vomiting worsens report to ER.

## 2015-02-10 NOTE — Patient Instructions (Signed)
If pain worsens or does not improve, fever, nausea vomiting develop report to ER or clinic

## 2015-02-12 LAB — URINE CULTURE

## 2015-02-19 ENCOUNTER — Ambulatory Visit (INDEPENDENT_AMBULATORY_CARE_PROVIDER_SITE_OTHER): Payer: 59 | Admitting: *Deleted

## 2015-02-19 DIAGNOSIS — Z23 Encounter for immunization: Secondary | ICD-10-CM

## 2015-04-14 ENCOUNTER — Other Ambulatory Visit: Payer: Self-pay | Admitting: Nurse Practitioner

## 2015-05-21 ENCOUNTER — Other Ambulatory Visit: Payer: Self-pay | Admitting: Nurse Practitioner

## 2015-06-16 ENCOUNTER — Other Ambulatory Visit: Payer: Self-pay | Admitting: Physician Assistant

## 2015-06-18 ENCOUNTER — Ambulatory Visit (INDEPENDENT_AMBULATORY_CARE_PROVIDER_SITE_OTHER): Payer: Commercial Managed Care - HMO | Admitting: Nurse Practitioner

## 2015-06-18 ENCOUNTER — Encounter: Payer: Self-pay | Admitting: Nurse Practitioner

## 2015-06-18 VITALS — BP 120/76 | HR 58 | Temp 96.9°F | Ht 69.0 in | Wt 228.0 lb

## 2015-06-18 DIAGNOSIS — E034 Atrophy of thyroid (acquired): Secondary | ICD-10-CM

## 2015-06-18 DIAGNOSIS — I10 Essential (primary) hypertension: Secondary | ICD-10-CM

## 2015-06-18 DIAGNOSIS — F329 Major depressive disorder, single episode, unspecified: Secondary | ICD-10-CM

## 2015-06-18 DIAGNOSIS — K219 Gastro-esophageal reflux disease without esophagitis: Secondary | ICD-10-CM | POA: Diagnosis not present

## 2015-06-18 DIAGNOSIS — F32A Depression, unspecified: Secondary | ICD-10-CM

## 2015-06-18 DIAGNOSIS — E038 Other specified hypothyroidism: Secondary | ICD-10-CM

## 2015-06-18 DIAGNOSIS — E785 Hyperlipidemia, unspecified: Secondary | ICD-10-CM | POA: Diagnosis not present

## 2015-06-18 DIAGNOSIS — Z6833 Body mass index (BMI) 33.0-33.9, adult: Secondary | ICD-10-CM

## 2015-06-18 MED ORDER — FENOFIBRATE 160 MG PO TABS: ORAL_TABLET | ORAL | Status: DC

## 2015-06-18 MED ORDER — ESCITALOPRAM OXALATE 10 MG PO TABS: ORAL_TABLET | ORAL | Status: DC

## 2015-06-18 MED ORDER — PANTOPRAZOLE SODIUM 40 MG PO TBEC: 40.0000 mg | DELAYED_RELEASE_TABLET | ORAL | Status: DC | PRN

## 2015-06-18 MED ORDER — VALSARTAN-HYDROCHLOROTHIAZIDE 160-12.5 MG PO TABS: 1.0000 | ORAL_TABLET | Freq: Every day | ORAL | Status: DC

## 2015-06-18 MED ORDER — LEVOTHYROXINE SODIUM 75 MCG PO TABS: 75.0000 ug | ORAL_TABLET | Freq: Every day | ORAL | Status: DC

## 2015-06-18 MED ORDER — METOPROLOL SUCCINATE ER 50 MG PO TB24: ORAL_TABLET | ORAL | Status: DC

## 2015-06-18 NOTE — Progress Notes (Signed)
Subjective:    Patient ID: Casey [name], female    DOB: December 20, 1960, 54 y.o.   MRN: [id number]  Patient here tday for follow up of chronic medical problems- She is c/o feeling tired all the time and hair loss.   Hypertension This is a chronic problem. The current episode started more than 1 year ago. The problem is unchanged. The problem is controlled. Risk factors for coronary artery disease include dyslipidemia, obesity and post-menopausal state. Past treatments include angiotensin blockers and diuretics. The current treatment provides significant improvement. There are no compliance problems.  There is no history of CAD/MI or CVA.  Hyperlipidemia This is a chronic problem. The current episode started more than 1 year ago. Recent lipid tests were reviewed and are variable. Exacerbating diseases include obesity. She has no history of diabetes or hypothyroidism. Current antihyperlipidemic treatment includes fibric acid derivatives. The current treatment provides moderate improvement of lipids. There are no compliance problems.  Risk factors for coronary artery disease include dyslipidemia, hypertension, obesity and post-menopausal.  Hypothyroidism Levothyroxine working well - No c/o side effects Depression Lexapro helps- Keeps her calm GERD Protonix working well - keeps symptoms under control   Review of Systems  Constitutional: Negative.   HENT: Negative.   Respiratory: Negative.   Cardiovascular: Negative.   Gastrointestinal: Negative.   Genitourinary: Negative.   Neurological: Negative.   Psychiatric/Behavioral: Negative.   All other systems reviewed and are negative.      Objective:   Physical Exam  Constitutional: She is oriented to person, place, and time. She appears well-developed and well-nourished.  HENT:  Nose: Nose normal.  Mouth/Throat: Oropharynx is clear and moist.  Eyes: EOM are normal.  Neck: Trachea normal, normal range of motion and full passive range of  motion without pain. Neck supple. No JVD present. Carotid bruit is not present. No thyromegaly present.  Cardiovascular: Normal rate, regular rhythm, normal heart sounds and intact distal pulses.  Exam reveals no gallop and no friction rub.   No murmur heard. Pulmonary/Chest: Effort normal and breath sounds normal.  Abdominal: Soft. Bowel sounds are normal. She exhibits no distension and no mass. There is no tenderness.  Musculoskeletal: Normal range of motion.  Lymphadenopathy:    She has no cervical adenopathy.  Neurological: She is alert and oriented to person, place, and time. She has normal reflexes.  Skin: Skin is warm and dry.  Psychiatric: She has a normal mood and affect. Her behavior is normal. Judgment and thought content normal.   BP 120/76 mmHg  Pulse 58  Temp(Src) 96.9 F (36.1 C) (Oral)  Ht _0  (1.753 m)  Wt 228 lb (103.42 kg)  BMI 33.65 kg/m2  LMP 05/23/1995       Assessment & Plan:  1. Essential hypertension Do not add salt to diet - valsartan-hydrochlorothiazide (DIOVAN-HCT) 160-12.5 MG per tablet; Take 1 tablet by mouth daily.  Dispense: 30 tablet; Refill: 5 - metoprolol succinate (TOPROL-XL) 50 MG 24 hr tablet; TAKE ONE (1) TABLET EACH DAY  Dispense: 30 tablet; Refill: 5 - CMP14+EGFR  2. Gastroesophageal reflux disease without esophagitis Avoid spicy foods Do not eat 2 hours prior to bedtime  3. Hypothyroidism due to acquired atrophy of thyroid - levothyroxine (SYNTHROID, LEVOTHROID) 75 MCG tablet; Take 1 tablet (75 mcg total) by mouth daily.  Dispense: 30 tablet; Refill: 5 - Thyroid Panel With TSH  4. Hyperlipidemia Low fat diet - fenofibrate 160 MG tablet; TAKE ONE (1) TABLET EACH DAY  Dispense: 30 tablet; Refill: 5 -  Lipid panel  5. Depression Stress management - escitalopram (LEXAPRO) 10 MG tablet; TAKE ONE (1) TABLET EACH DAY  Dispense: 30 tablet; Refill: 5  6. BMI 33.0-33.9,adult Discussed diet and exercise for person with BMI >25 Will  recheck weight in 3-6 months   7. Gastroesophageal reflux disease, esophagitis presence not specified Avoid spicy foods Do not eat 2 hours prior to bedtime - pantoprazole (PROTONIX) 40 MG tablet; Take 1 tablet (40 mg total) by mouth as needed.  Dispense: 30 tablet; Refill: 5    Labs pending Health maintenance reviewed Diet and exercise encouraged Continue all meds Follow up  In 6 month   [place], FNP

## 2015-06-19 LAB — CMP14+EGFR
ALBUMIN: 4.5 g/dL (ref 3.5–5.5)
ALK PHOS: 43 IU/L (ref 39–117)
ALT: 16 IU/L (ref 0–32)
AST: 18 IU/L (ref 0–40)
Albumin/Globulin Ratio: 2.4 (ref 1.1–2.5)
BUN/Creatinine Ratio: 15 (ref 9–23)
BUN: 16 mg/dL (ref 6–24)
Bilirubin Total: <0.2 mg/dL (ref 0.0–1.2)
CALCIUM: 9.7 mg/dL (ref 8.7–10.2)
CO2: 26 mmol/L (ref 18–29)
CREATININE: 1.06 mg/dL — AB (ref 0.57–1.00)
Chloride: 101 mmol/L (ref 97–108)
GFR, EST AFRICAN AMERICAN: 69 mL/min/{1.73_m2} (ref >59)
GFR, EST NON AFRICAN AMERICAN: 60 mL/min/{1.73_m2} (ref >59)
GLOBULIN, TOTAL: 1.9 g/dL (ref 1.5–4.5)
Glucose: 90 mg/dL (ref 65–99)
POTASSIUM: 4.1 mmol/L (ref 3.5–5.2)
Sodium: 144 mmol/L (ref 134–144)
Total Protein: 6.4 g/dL (ref 6.0–8.5)

## 2015-06-19 LAB — THYROID PANEL WITH TSH
Free Thyroxine Index: 2.9 (ref 1.2–4.9)
T3 Uptake Ratio: 30 % (ref 24–39)
T4 TOTAL: 9.8 ug/dL (ref 4.5–12.0)
TSH: 2.66 u[IU]/mL (ref 0.450–4.500)

## 2015-06-19 LAB — LIPID PANEL
Chol/HDL Ratio: 2.5 ratio units (ref 0.0–4.4)
Cholesterol, Total: 140 mg/dL (ref 100–199)
HDL: 56 mg/dL (ref >39)
LDL Calculated: 64 mg/dL (ref 0–99)
TRIGLYCERIDES: 98 mg/dL (ref 0–149)
VLDL Cholesterol Cal: 20 mg/dL (ref 5–40)

## 2015-07-04 ENCOUNTER — Other Ambulatory Visit: Payer: Self-pay

## 2015-07-04 ENCOUNTER — Ambulatory Visit (INDEPENDENT_AMBULATORY_CARE_PROVIDER_SITE_OTHER): Payer: Commercial Managed Care - HMO | Admitting: Certified Nurse Midwife

## 2015-07-04 ENCOUNTER — Ambulatory Visit
Admission: RE | Admit: 2015-07-04 | Discharge: 2015-07-04 | Disposition: A | Discharge status: Home / Self Care | Payer: Commercial Managed Care - HMO | Source: Ambulatory Visit

## 2015-07-04 ENCOUNTER — Encounter: Payer: Self-pay | Admitting: Certified Nurse Midwife

## 2015-07-04 VITALS — BP 120/70 | HR 64 | Resp 16 | Ht 70.0 in | Wt 228.0 lb

## 2015-07-04 DIAGNOSIS — Z01419 Encounter for gynecological examination (general) (routine) without abnormal findings: Secondary | ICD-10-CM

## 2015-07-04 DIAGNOSIS — Z1231 Encounter for screening mammogram for malignant neoplasm of breast: Secondary | ICD-10-CM

## 2015-07-04 IMAGING — MG MM SCREEN MAMMOGRAM BILATERAL
5 series · 5 of 5 positions shown · non-contrast
Comparison: Previous exam(s).

CLINICAL DATA: Screening.

EXAM:
DIGITAL SCREENING BILATERAL MAMMOGRAM WITH CAD

[R CC]
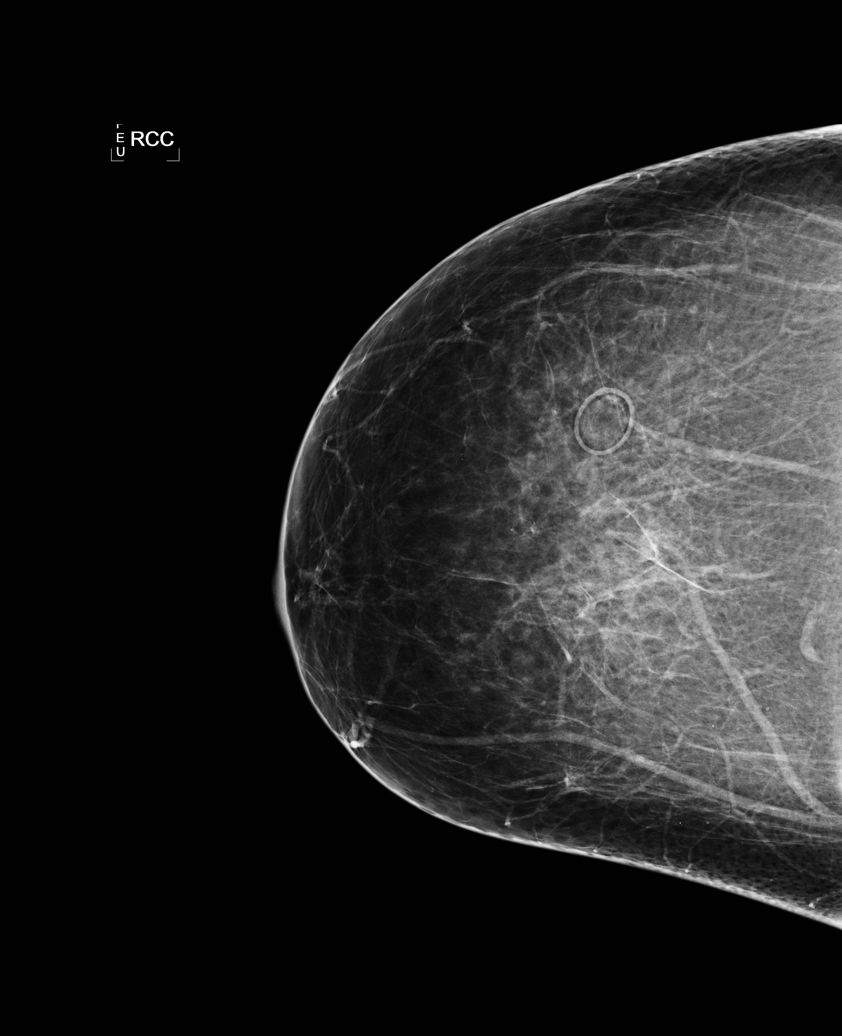

[L CC]
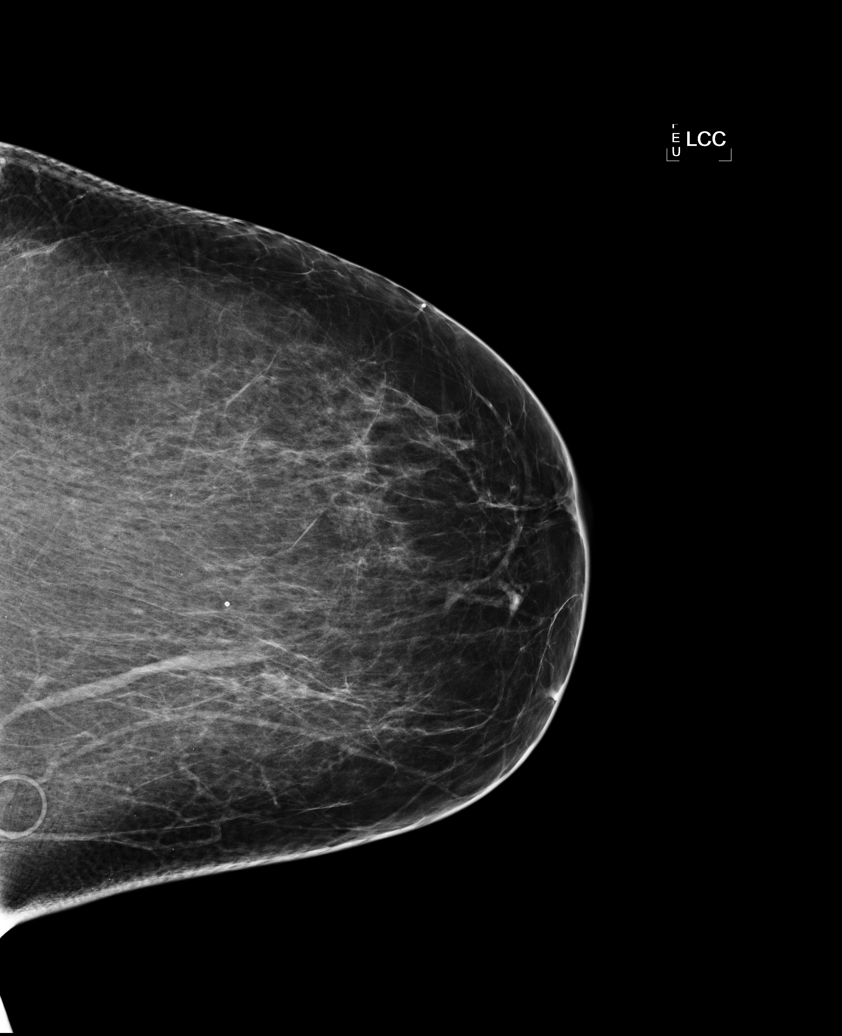

[L MLO (1 of 2)]
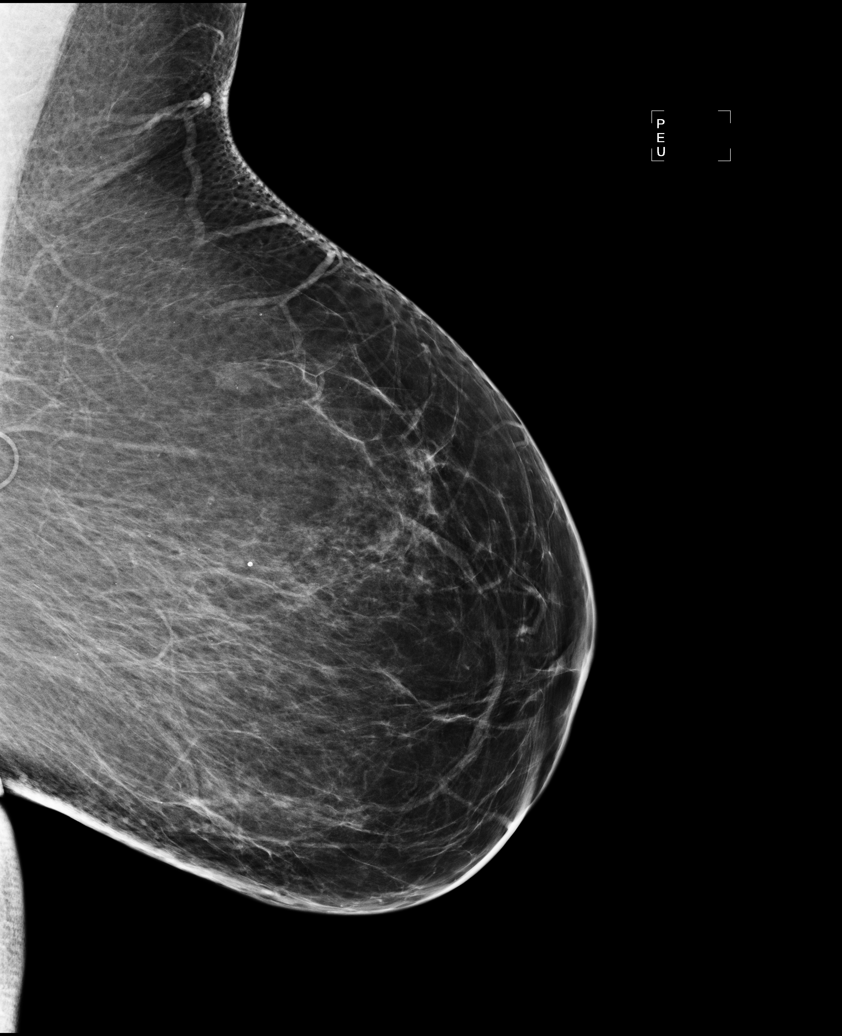

[R MLO]
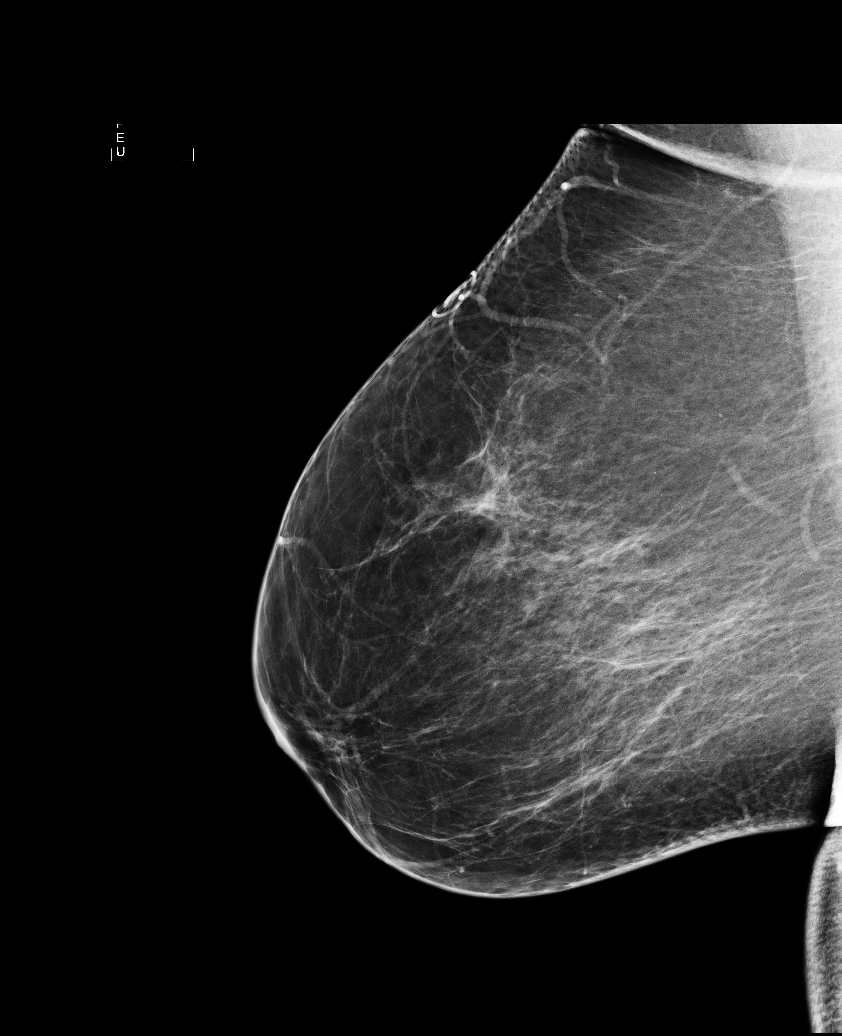

[L MLO (2 of 2)]
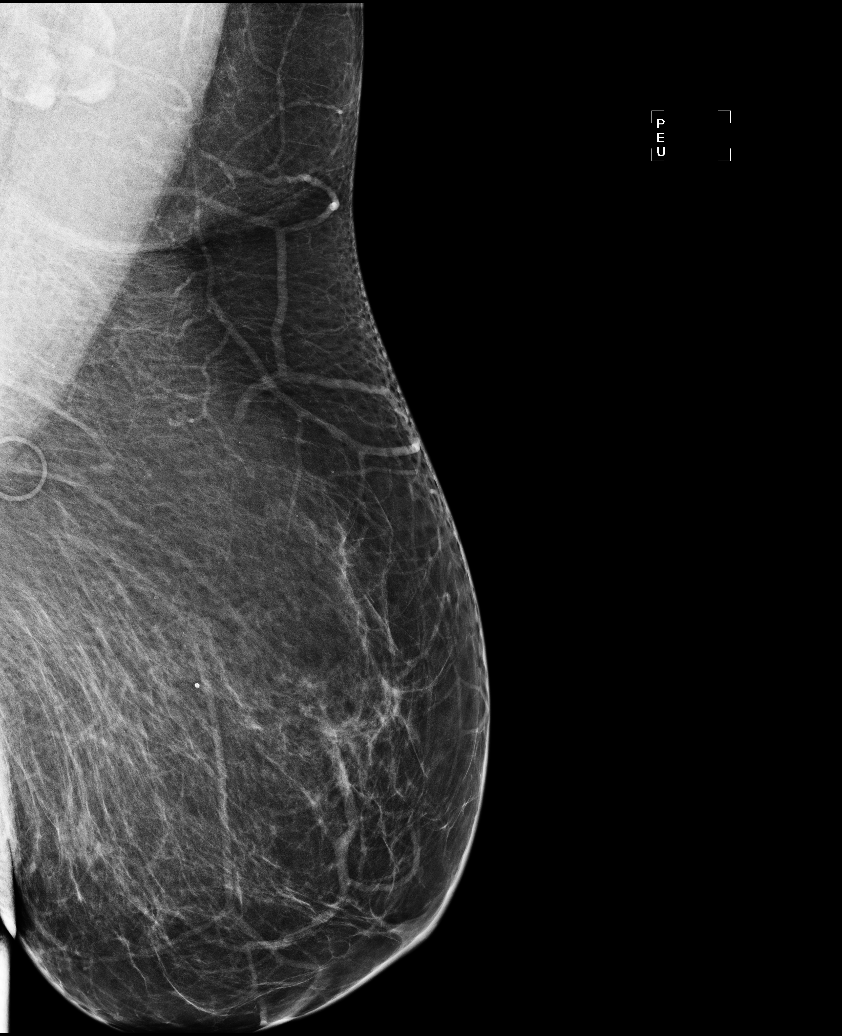

[5 of 5 positions shown; findings below may reference images not displayed]

ACR Breast Density Category b: There are scattered areas of
fibroglandular density.
FINDINGS: There are no findings suspicious for malignancy. Images were
processed with CAD.
IMPRESSION: No mammographic evidence of malignancy. A result letter of this
screening mammogram will be mailed directly to the patient.

RECOMMENDATION:
Screening mammogram in one year. (Code:[US])

BI-RADS CATEGORY  1: Negative.

## 2015-07-04 NOTE — Progress Notes (Signed)
54 y.o. G39P3003 Married  Caucasian Fe here for annual exam. Menopausal no hot flashes and night sweats. Denies vaginal bleeding or vaginal dryness issues. Sees PCP for aex/labs and hypertension/hypothyroid/ anxiety. No medication changes or lab concerns. Patient treated with constipation and large amount stool and now on Miralax daily, which is works well. Trying to increase exercise. First grandchild !!! Having fun with him.  No health issues today.  Patient's last menstrual period was 05/23/1995.          Sexually active: Yes.    The current method of family planning is status post hysterectomy.    Exercising: Yes.    Walking Smoker:  no  Health Maintenance: Pap: 06/10/10 neg MMG:  06/27/14 BIRADS1:Neg. Has appt today Self Breast Exam: yes, occ Colonoscopy:  Never BMD:   Never TDaP:  01/2015 Labs: PCP   reports that she has never smoked. She has never used smokeless tobacco. She reports that she does not drink alcohol or use illicit drugs.  Past Medical History  Diagnosis Date  . Grave's disease   . DUB (dysfunctional uterine bleeding)   . Palpitations   . Hypertension   . Hyperlipidemia   . Depression   . GERD (gastroesophageal reflux disease)   . Migraines     Past Surgical History  Procedure Laterality Date  . Vesicovaginal fistula closure w/ tah      Dr. Margaretha Glassing  2 degree DUB   . Tonsillectomy    . Vaginal hysterectomy      Current Outpatient Prescriptions  Medication Sig Dispense Refill  . cetirizine (ZYRTEC) 10 MG tablet Take 1 tablet (10 mg total) by mouth daily. 30 tablet 5  . escitalopram (LEXAPRO) 10 MG tablet TAKE ONE (1) TABLET EACH DAY 30 tablet 5  . fenofibrate 160 MG tablet TAKE ONE (1) TABLET EACH DAY 30 tablet 5  . levothyroxine (SYNTHROID, LEVOTHROID) 75 MCG tablet Take 1 tablet (75 mcg total) by mouth daily. 30 tablet 5  . metoprolol succinate (TOPROL-XL) 50 MG 24 hr tablet TAKE ONE (1) TABLET EACH DAY 30 tablet 5  . pantoprazole (PROTONIX) 40 MG  tablet Take 1 tablet (40 mg total) by mouth as needed. 30 tablet 5  . polyethylene glycol powder (GLYCOLAX/MIRALAX) powder Take 17 g by mouth daily. 3350 g 1  . valsartan-hydrochlorothiazide (DIOVAN-HCT) 160-12.5 MG per tablet Take 1 tablet by mouth daily. 30 tablet 5   No current facility-administered medications for this visit.    Family History  Problem Relation Age of Onset  . Hypertension Mother   . Irregular heart beat Mother   . Thyroid disease Maternal Grandmother   . Cancer Maternal Grandmother     lung  . Cancer Paternal Grandfather     lung    ROS:  Pertinent items are noted in HPI.  Otherwise, a comprehensive ROS was negative.  Exam:   BP 120/70 mmHg  Pulse 64  Resp 16  Ht 5\' 10"  (1.778 m)  Wt 228 lb (103.42 kg)  BMI 32.71 kg/m2  LMP 05/23/1995 Height: 5\' 10"  (177.8 cm) Ht Readings from Last 3 Encounters:  07/04/15 5\' 10"  (1.778 m)  06/18/15 5\' 9"  (1.753 m)  02/10/15 5\' 9"  (1.753 m)    General appearance: alert, cooperative and appears stated age Head: Normocephalic, without obvious abnormality, atraumatic Neck: no adenopathy, supple, symmetrical, trachea midline and thyroid normal to inspection and palpation Lungs: clear to auscultation bilaterally Breasts: normal appearance, no masses or tenderness, No nipple retraction or dimpling, No nipple  discharge or bleeding, No axillary or supraclavicular adenopathy Heart: regular rate and rhythm Abdomen: soft, non-tender; no masses,  no organomegaly Extremities: extremities normal, atraumatic, no cyanosis or edema Skin: Skin color, texture, turgor normal. No rashes or lesions Lymph nodes: Cervical, supraclavicular, and axillary nodes normal. No abnormal inguinal nodes palpated Neurologic: Grossly normal   Pelvic: External genitalia:  no lesions              Urethra:  normal appearing urethra with no masses, tenderness or lesions              Bartholin's and Skene's: normal                 Vagina: normal  appearing vagina with normal color and discharge, no lesions              Cervix: absent              Pap taken: No. Bimanual Exam:  Uterus:  uterus absent              Adnexa: normal adnexa and no mass, fullness, tenderness               Rectovaginal: Confirms               Anus:  normal sphincter tone, no lesions  Chaperone present: Yes  A:  Well Woman with normal exam  Menopausal no HRT S/P TVH ovaries retained  Hypertension/Hypothyroid/anxiety with PCP management  Colonoscopy due    P:   Reviewed health and wellness pertinent to exam  Continue follow up with PCP as indicated  Aware of need plans to schedule in [place], will advise if referral needed.  Pap smear as above not done   counseled on breast self exam, mammography screening, adequate intake of calcium and vitamin D, diet and exercise  return annually or prn  An After Visit Summary was printed and given to the patient.

## 2015-07-04 NOTE — Patient Instructions (Signed)

## 2015-07-04 NOTE — Progress Notes (Signed)
Reviewed personally.  M. Suzanne Lachele Lievanos, MD.  

## 2015-08-06 ENCOUNTER — Encounter: Payer: Self-pay | Admitting: Nurse Practitioner

## 2015-08-06 ENCOUNTER — Ambulatory Visit (HOSPITAL_COMMUNITY)
Admission: RE | Admit: 2015-08-06 | Discharge: 2015-08-06 | Disposition: A | Discharge status: Home / Self Care | Payer: Commercial Managed Care - HMO | Source: Ambulatory Visit | Attending: Nurse Practitioner | Admitting: Nurse Practitioner

## 2015-08-06 ENCOUNTER — Ambulatory Visit (INDEPENDENT_AMBULATORY_CARE_PROVIDER_SITE_OTHER): Payer: Commercial Managed Care - HMO

## 2015-08-06 ENCOUNTER — Inpatient Hospital Stay (HOSPITAL_COMMUNITY)
Admission: EM | Admit: 2015-08-06 | Discharge: 2015-08-09 | DRG: 340 | Disposition: A | Discharge status: Home / Self Care | Payer: Commercial Managed Care - HMO | Attending: Family Medicine | Admitting: Family Medicine

## 2015-08-06 ENCOUNTER — Encounter (HOSPITAL_COMMUNITY): Payer: Self-pay | Admitting: Emergency Medicine

## 2015-08-06 ENCOUNTER — Ambulatory Visit (INDEPENDENT_AMBULATORY_CARE_PROVIDER_SITE_OTHER): Payer: Commercial Managed Care - HMO | Admitting: Nurse Practitioner

## 2015-08-06 VITALS — BP 102/71 | HR 77 | Temp 97.8°F | Ht 69.0 in | Wt 231.4 lb

## 2015-08-06 DIAGNOSIS — Z809 Family history of malignant neoplasm, unspecified: Secondary | ICD-10-CM | POA: Diagnosis not present

## 2015-08-06 DIAGNOSIS — I10 Essential (primary) hypertension: Secondary | ICD-10-CM | POA: Diagnosis present

## 2015-08-06 DIAGNOSIS — R1084 Generalized abdominal pain: Secondary | ICD-10-CM

## 2015-08-06 DIAGNOSIS — R1031 Right lower quadrant pain: Secondary | ICD-10-CM

## 2015-08-06 DIAGNOSIS — R509 Fever, unspecified: Secondary | ICD-10-CM

## 2015-08-06 DIAGNOSIS — R110 Nausea: Secondary | ICD-10-CM

## 2015-08-06 DIAGNOSIS — R918 Other nonspecific abnormal finding of lung field: Secondary | ICD-10-CM | POA: Insufficient documentation

## 2015-08-06 DIAGNOSIS — K3533 Acute appendicitis with perforation and localized peritonitis, with abscess: Secondary | ICD-10-CM

## 2015-08-06 DIAGNOSIS — K353 Acute appendicitis with localized peritonitis, without perforation or gangrene: Secondary | ICD-10-CM

## 2015-08-06 DIAGNOSIS — Z8249 Family history of ischemic heart disease and other diseases of the circulatory system: Secondary | ICD-10-CM | POA: Diagnosis not present

## 2015-08-06 DIAGNOSIS — K5900 Constipation, unspecified: Secondary | ICD-10-CM | POA: Insufficient documentation

## 2015-08-06 DIAGNOSIS — K219 Gastro-esophageal reflux disease without esophagitis: Secondary | ICD-10-CM | POA: Diagnosis present

## 2015-08-06 DIAGNOSIS — E785 Hyperlipidemia, unspecified: Secondary | ICD-10-CM | POA: Diagnosis present

## 2015-08-06 DIAGNOSIS — K3532 Acute appendicitis with perforation and localized peritonitis, without abscess: Secondary | ICD-10-CM | POA: Diagnosis present

## 2015-08-06 DIAGNOSIS — K3580 Unspecified acute appendicitis: Secondary | ICD-10-CM | POA: Diagnosis present

## 2015-08-06 DIAGNOSIS — K352 Acute appendicitis with generalized peritonitis: Secondary | ICD-10-CM | POA: Diagnosis not present

## 2015-08-06 HISTORY — DX: Solitary pulmonary nodule: R91.1

## 2015-08-06 LAB — BASIC METABOLIC PANEL
Anion gap: 9 (ref 5–15)
BUN: 22 mg/dL — AB (ref 6–20)
CHLORIDE: 99 mmol/L — AB (ref 101–111)
CO2: 29 mmol/L (ref 22–32)
CREATININE: 1.09 mg/dL — AB (ref 0.44–1.00)
Calcium: 9.1 mg/dL (ref 8.9–10.3)
GFR calc Af Amer: >60 mL/min (ref >60)
GFR calc non Af Amer: 57 mL/min — ABNORMAL LOW (ref >60)
Glucose, Bld: 99 mg/dL (ref 65–99)
Potassium: 3.3 mmol/L — ABNORMAL LOW (ref 3.5–5.1)
SODIUM: 137 mmol/L (ref 135–145)

## 2015-08-06 LAB — PROTIME-INR
INR: 1.12 (ref 0.00–1.49)
PROTHROMBIN TIME: 14.6 s (ref 11.6–15.2)

## 2015-08-06 LAB — CBC
HCT: 38.8 % (ref 36.0–46.0)
HEMOGLOBIN: 12.9 g/dL (ref 12.0–15.0)
MCH: 29.4 pg (ref 26.0–34.0)
MCHC: 33.2 g/dL (ref 30.0–36.0)
MCV: 88.4 fL (ref 78.0–100.0)
PLATELETS: 290 10*3/uL (ref 150–400)
RBC: 4.39 MIL/uL (ref 3.87–5.11)
RDW: 12.7 % (ref 11.5–15.5)
WBC: 12.8 10*3/uL — ABNORMAL HIGH (ref 4.0–10.5)

## 2015-08-06 LAB — TYPE AND SCREEN
ABO/RH(D): O POS
Antibody Screen: NEGATIVE

## 2015-08-06 LAB — APTT: aPTT: 30 seconds (ref 24–37)

## 2015-08-06 IMAGING — CR DG ABDOMEN 1V
1 series · 1 of 1 positions shown · non-contrast
Comparison: [DATE].

CLINICAL DATA: Abdominal pain.

EXAM:
ABDOMEN - 1 VIEW

[view not recorded]
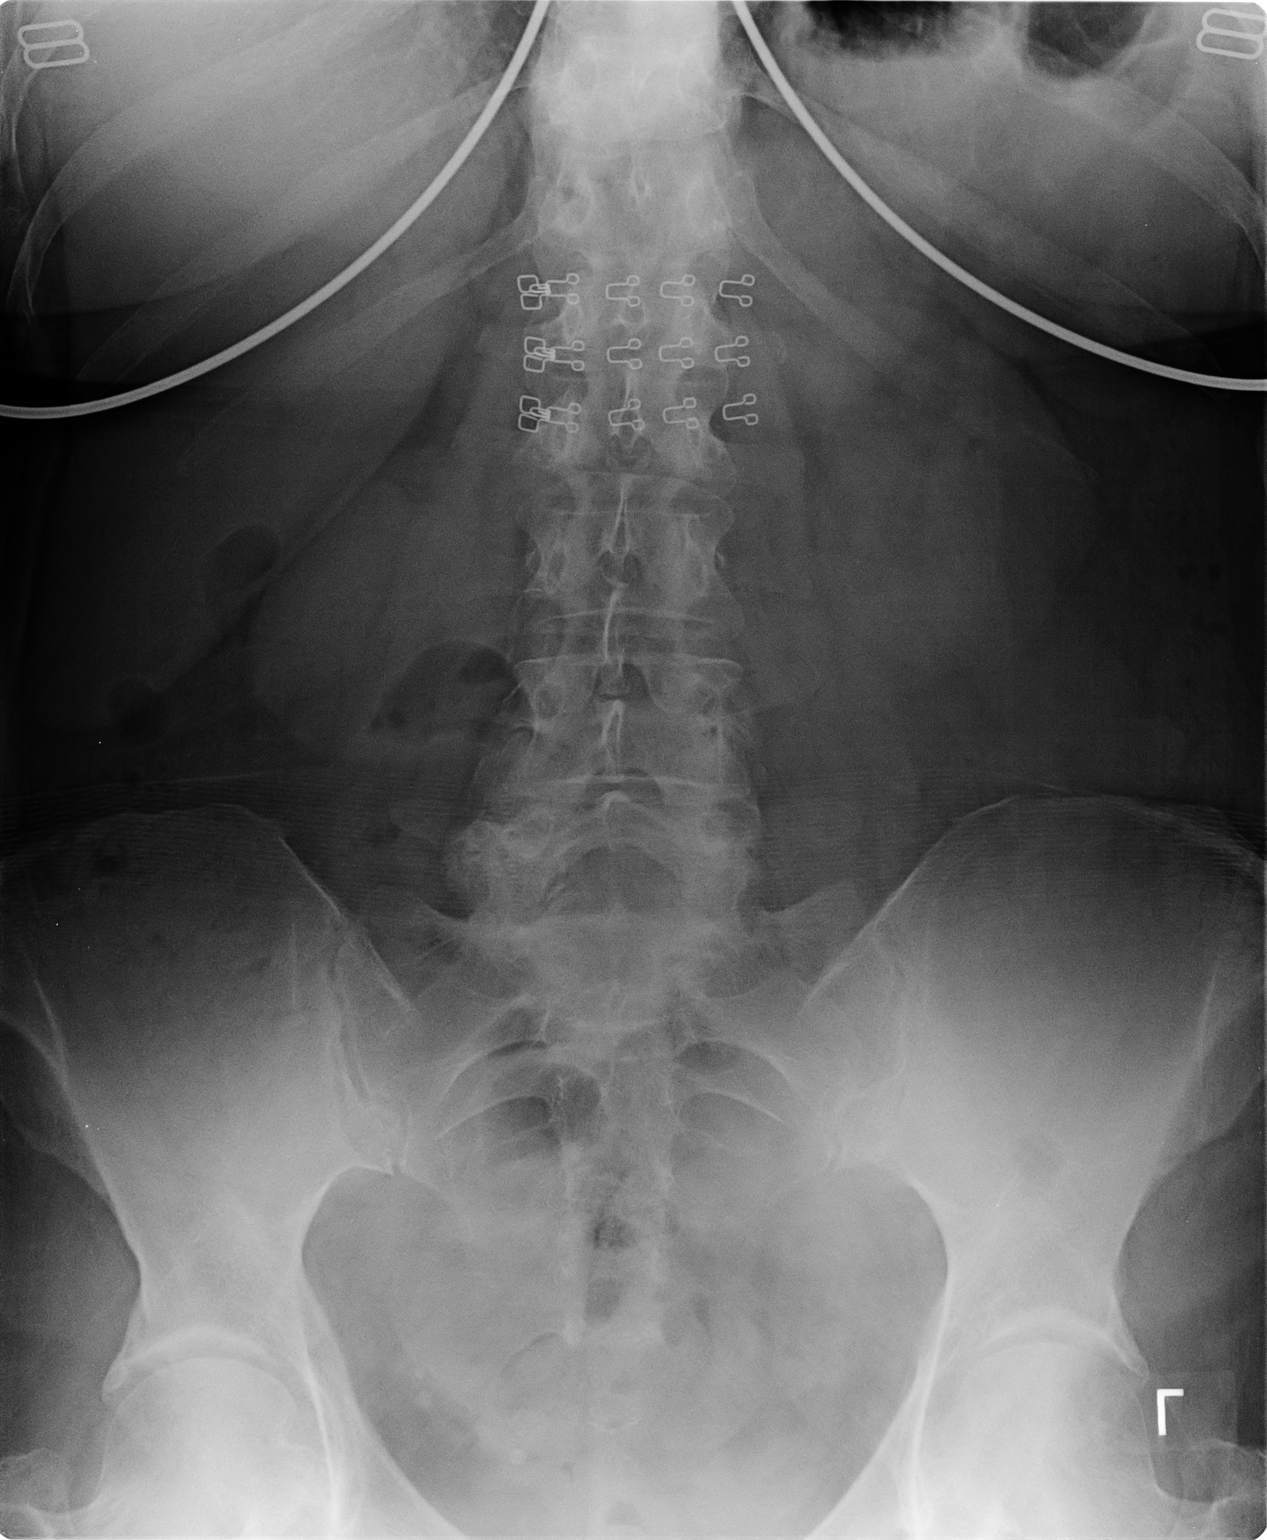

[1 of 1 positions shown; findings below may reference images not displayed]

FINDINGS: Soft tissue structures are unremarkable. No bowel distention. No
free air. No prominent stool burden. No acute bony abnormality.
Degenerative changes lumbar spine and both hips.
IMPRESSION: No acute abnormality.  Stable exam from [DATE].

## 2015-08-06 IMAGING — CT CT ABD-PELV W/ CM
2 of 5 series · 15 of 46 positions shown, 17 images · IV contrast (omnipaque)
Comparison: None.

ADDENDUM:
Critical Value/emergent results were called by telephone at the time
of interpretation on [DATE] at [DATE] to Dr. CEEJAY,
who verbally acknowledged these results.
CLINICAL DATA: Generalized abdominal pain with nausea 1 week. Right
lower quadrant pain 1 day with constipation. Low-grade fever.

EXAM:
CT ABDOMEN AND PELVIS WITH CONTRAST
TECHNIQUE: Multidetector CT imaging of the abdomen and pelvis was performed
using the standard protocol following bolus administration of
intravenous contrast.
CONTRAST:  100mL OMNIPAQUE IOHEXOL 300 MG/ML  SOLN

[Series 2: abd_pel_with 5.0 b40f · axial · 0.71mm/px · z∈[+138,+588]mm · 12 of 102 slices shown, 14 images]
[im 6/102  soft-tissue]
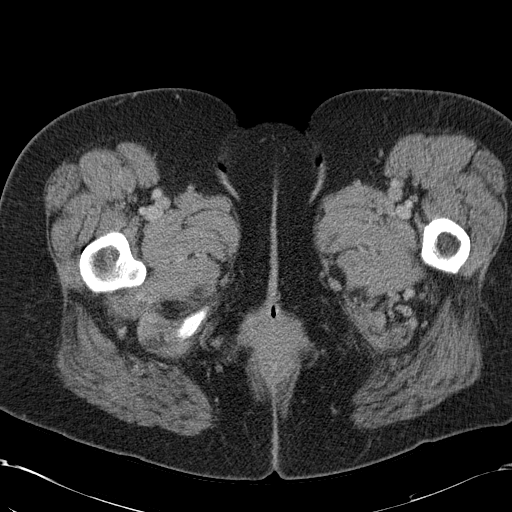
[im 6/102  bone]
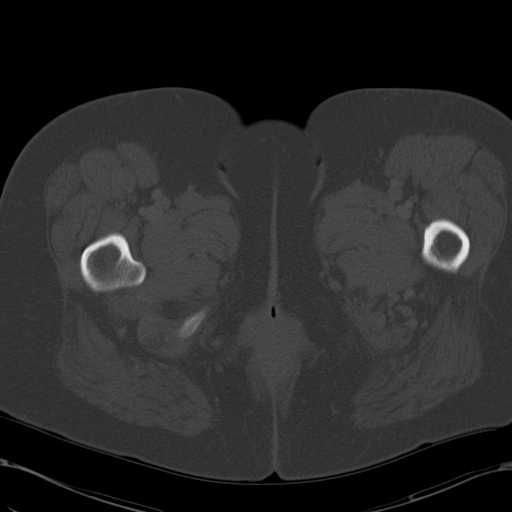
[im 18/102  soft-tissue]
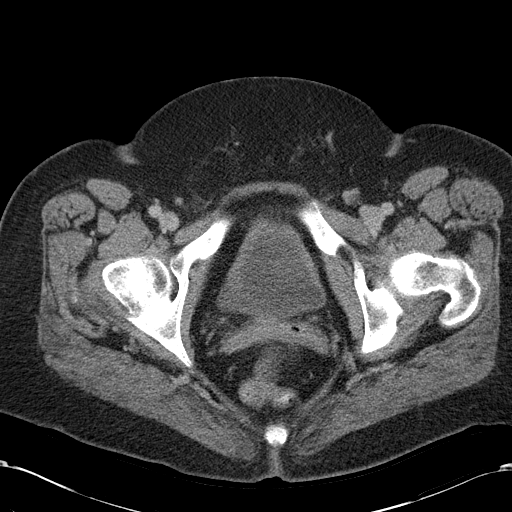
[im 24/102  soft-tissue]
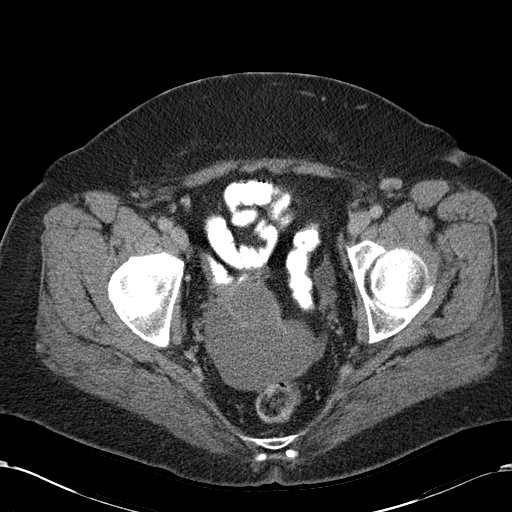
[im 30/102  soft-tissue]
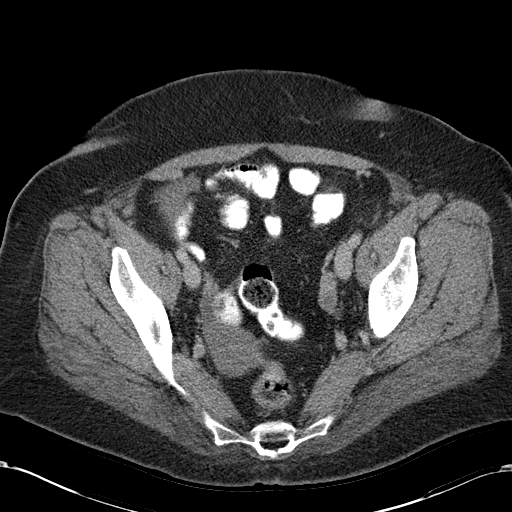
[im 42/102  soft-tissue]
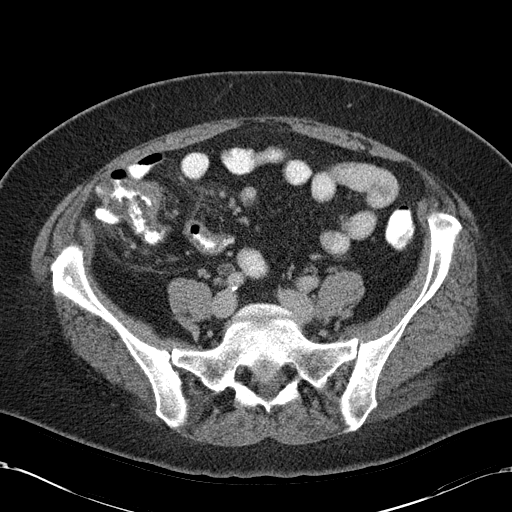
[im 48/102  soft-tissue]
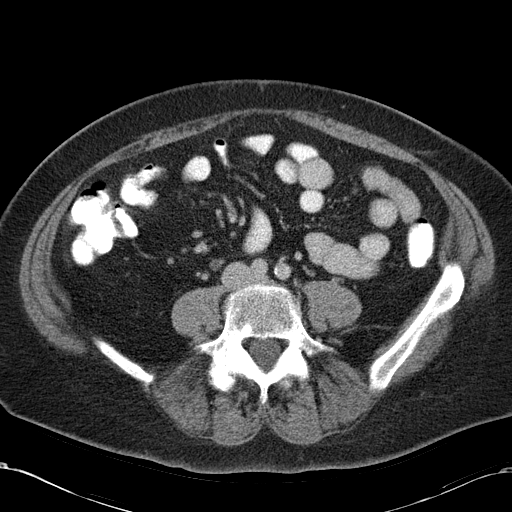
[im 54/102  soft-tissue]
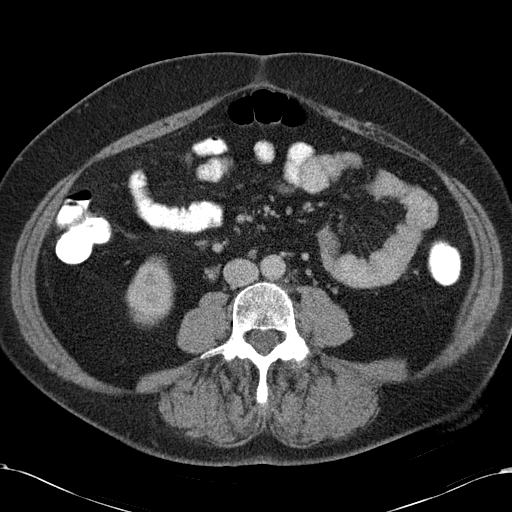
[im 66/102  soft-tissue]
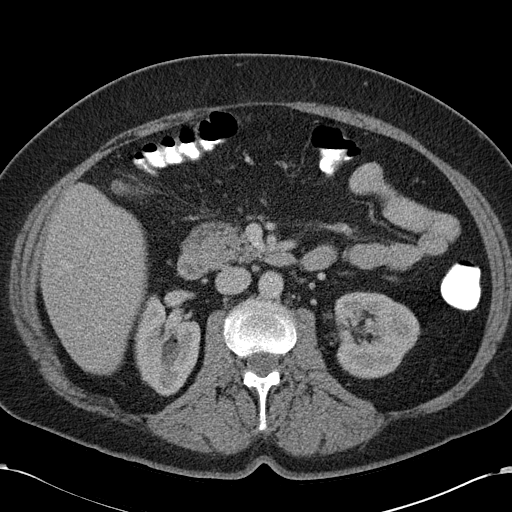
[im 72/102  soft-tissue]
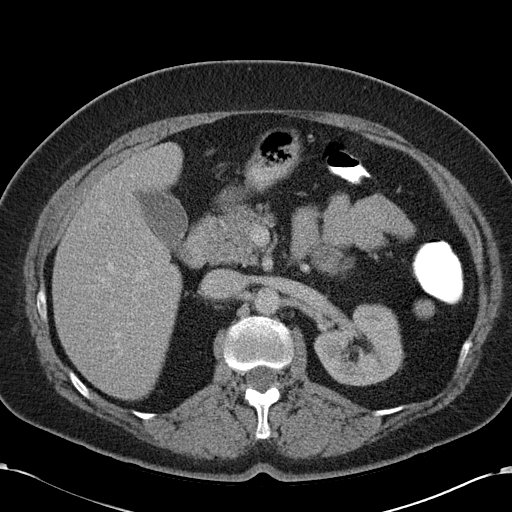
[im 72/102  bone]
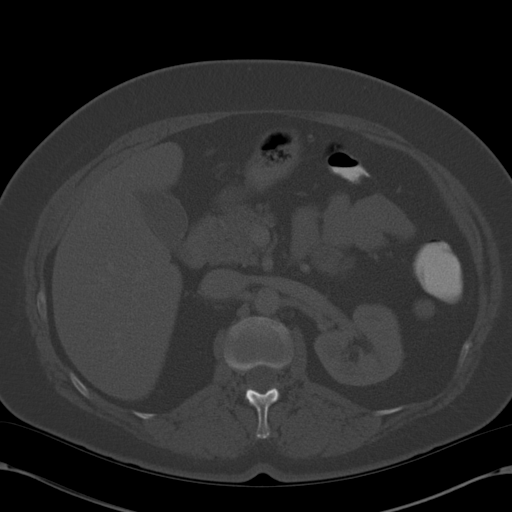
[im 78/102  soft-tissue]
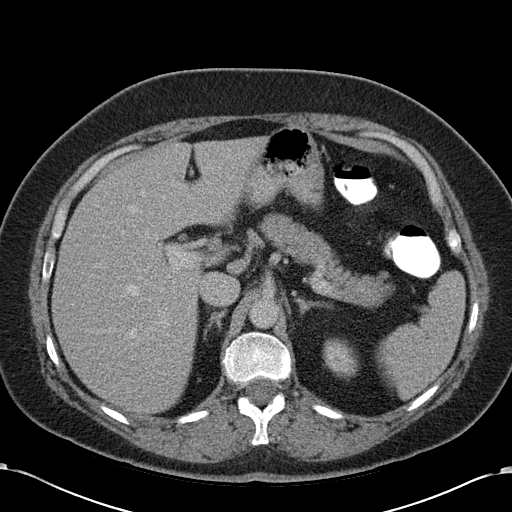
[im 90/102  soft-tissue]
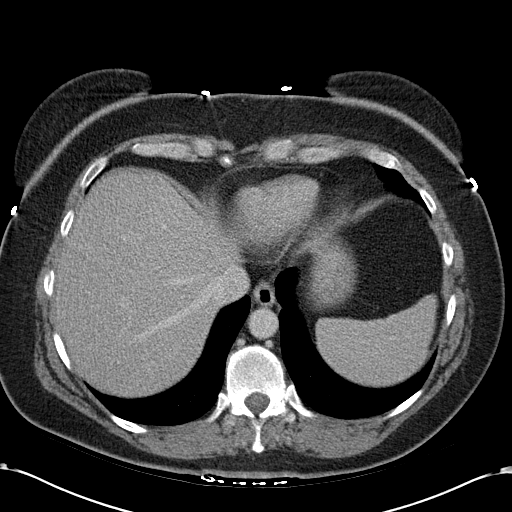
[im 96/102  soft-tissue]
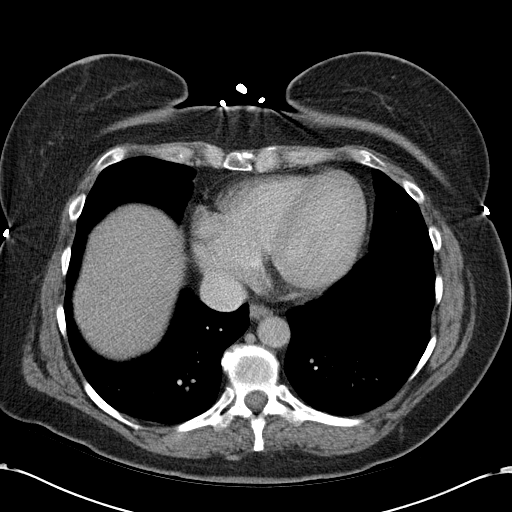

[Series 4: abd_pel_with 3.0 spo cor · coronal · 0.73mm/px · 3 of 99 slices shown]
[im 33/99  soft-tissue]
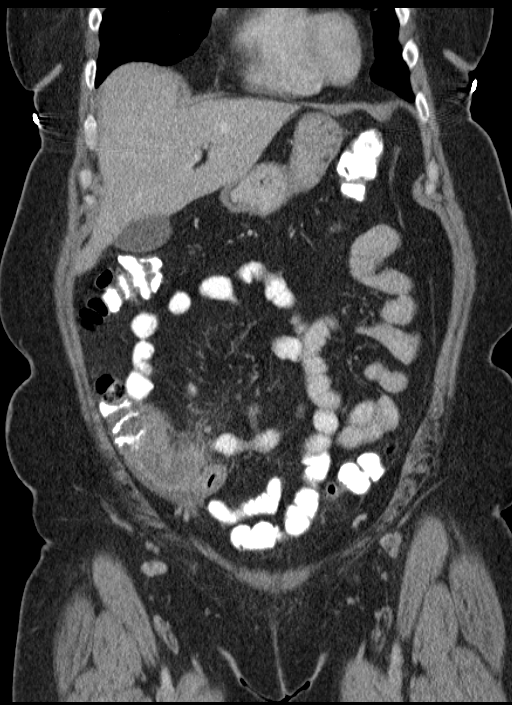
[im 44/99  soft-tissue]
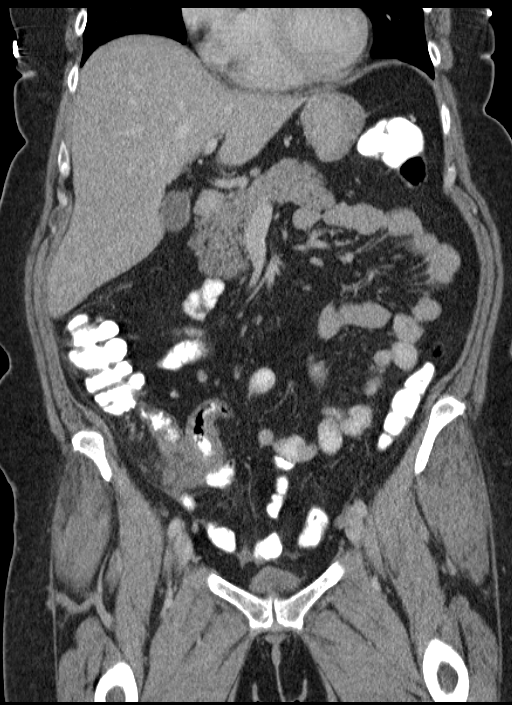
[im 55/99  soft-tissue]
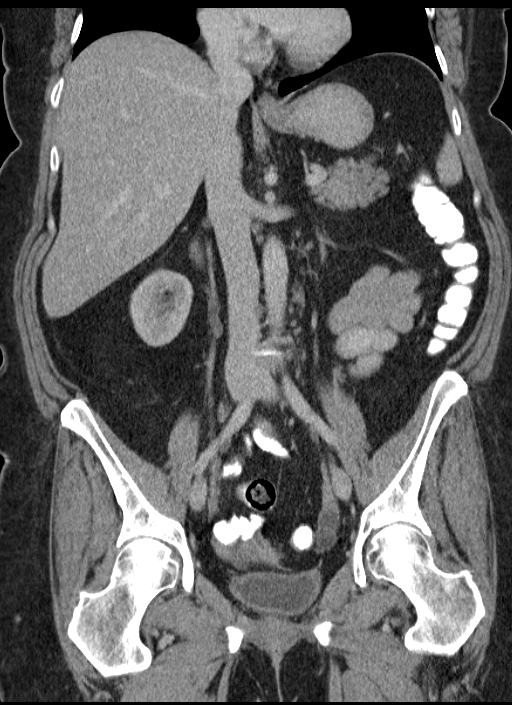

[15 of 46 positions shown; findings below may reference images not displayed]

FINDINGS: Lung bases demonstrate a few small pulmonary nodules with the
largest measuring 5 mm over the right middle lobe.

Abdominal images demonstrate a normal liver, spleen, pancreas,
gallbladder and adrenal glands. Kidneys are normal in size without
evidence of nephrolithiasis. There is 2 sub cm left renal cortical
hypodensities too small to characterize but likely a cysts. There is
minimal prominence of the right intrarenal collecting system and
ureter. Ureters are otherwise within normal without evidence of
stones.

Within the right lower quadrant there is a rim enhancing fluid
collection abutting the cecal tip measuring approximately 3.7 x
cm likely sequelae from acute appendicitis with contained rupture
and periappendiceal abscess. There is secondary wall thickening of
an immediately adjacent small bowel loop. Minimal wall thickening of
the adjacent terminal ileum. No evidence of free air. There is mild
adjacent inflammatory change and minimal free fluid in the right
pericolic gutter. Mild to moderate free fluid over the pelvis.
Multiple small adjacent mesenteric lymph nodes in the right lower
quadrant.

Remainder of the colon and small bowel are within normal.

Vascular structures are within normal.

Remaining pelvic images demonstrate the bladder and rectum to be
within normal. Ovaries are unremarkable.

There are minimal degenerate changes of the spine and hips.
IMPRESSION: Findings as described in the right lower quadrant likely
representing acute appendicitis with contained perforation and
periappendiceal abscess formation with abscess measuring
approximately 3.7 x 4.4 cm. No free air.

Two small sub cm left renal cortical hypodensities too small to
characterize, but likely cysts.

A few small bilateral pulmonary nodules with the largest measuring 5
mm over the right middle lobe. Recommend followup chest CT 6 months.
This recommendation follows the consensus statement: Guidelines for
Management of Small Pulmonary Nodules Detected on CT Scans: A
Statement from the [HOSPITAL] as published in Radiology
[VC]; [DATE]. Online at:
[URL]

Minimal prominence of the right intrarenal collecting system and
ureter likely secondary to the right lower infectious process.

CEEJAY in process of contacting patient's physician.

## 2015-08-06 MED ORDER — POTASSIUM CHLORIDE IN NACL 20-0.9 MEQ/L-% IV SOLN
INTRAVENOUS | Status: DC
  Administered 2015-08-06 – 2015-08-07 (×2): via INTRAVENOUS

## 2015-08-06 MED ORDER — PIPERACILLIN-TAZOBACTAM 3.375 G IVPB 30 MIN
3.3750 g | Freq: Once | INTRAVENOUS | Status: AC
  Administered 2015-08-06: 3.375 g via INTRAVENOUS
  Filled 2015-08-06: qty 50

## 2015-08-06 MED ORDER — DIPHENHYDRAMINE HCL 50 MG/ML IJ SOLN
25.0000 mg | Freq: Once | INTRAMUSCULAR | Status: AC
Start: 2015-08-06 — End: 2015-08-06
  Administered 2015-08-06: 25 mg via INTRAVENOUS
  Filled 2015-08-06: qty 1

## 2015-08-06 MED ORDER — HYDRALAZINE HCL 20 MG/ML IJ SOLN: 5.0000 mg | INTRAMUSCULAR | Status: DC | PRN

## 2015-08-06 MED ORDER — METRONIDAZOLE 500 MG PO TABS: 500.0000 mg | ORAL_TABLET | Freq: Two times a day (BID) | ORAL | Status: DC

## 2015-08-06 MED ORDER — PIPERACILLIN-TAZOBACTAM 3.375 G IVPB
3.3750 g | Freq: Three times a day (TID) | INTRAVENOUS | Status: DC
  Administered 2015-08-07 – 2015-08-09 (×7): 3.375 g via INTRAVENOUS
  Filled 2015-08-06 (×14): qty 50

## 2015-08-06 MED ORDER — INFLUENZA VAC SPLIT QUAD 0.5 ML IM SUSY
0.5000 mL | PREFILLED_SYRINGE | INTRAMUSCULAR | Status: AC
  Administered 2015-08-08: 0.5 mL via INTRAMUSCULAR
  Filled 2015-08-06: qty 0.5

## 2015-08-06 MED ORDER — ONDANSETRON HCL 4 MG PO TABS: 4.0000 mg | ORAL_TABLET | Freq: Four times a day (QID) | ORAL | Status: DC | PRN

## 2015-08-06 MED ORDER — ONDANSETRON HCL 4 MG/2ML IJ SOLN: 4.0000 mg | Freq: Four times a day (QID) | INTRAMUSCULAR | Status: DC | PRN

## 2015-08-06 MED ORDER — ONDANSETRON HCL 4 MG/2ML IJ SOLN
4.0000 mg | Freq: Once | INTRAMUSCULAR | Status: AC
  Administered 2015-08-06: 4 mg via INTRAVENOUS
  Filled 2015-08-06: qty 2

## 2015-08-06 MED ORDER — CIPROFLOXACIN HCL 500 MG PO TABS: 500.0000 mg | ORAL_TABLET | Freq: Two times a day (BID) | ORAL | Status: DC

## 2015-08-06 MED ORDER — IOHEXOL 300 MG/ML  SOLN
100.0000 mL | Freq: Once | INTRAMUSCULAR | Status: AC | PRN
  Administered 2015-08-06: 100 mL via INTRAVENOUS

## 2015-08-06 MED ORDER — SODIUM CHLORIDE 0.9 % IV BOLUS (SEPSIS)
1000.0000 mL | Freq: Once | INTRAVENOUS | Status: AC
  Administered 2015-08-06: 1000 mL via INTRAVENOUS

## 2015-08-06 MED ORDER — HYDROMORPHONE HCL 1 MG/ML IJ SOLN: 0.5000 mg | INTRAMUSCULAR | Status: DC | PRN

## 2015-08-06 NOTE — Addendum Note (Signed)
Addended by: Chevis Pretty on: 08/06/2015 04:38 PM   Modules accepted: Orders

## 2015-08-06 NOTE — ED Provider Notes (Signed)
Pt had reaction/rash after given zosyn No distress noted No SOB noted No angioedema She is responding to benadryl   Ripley Fraise, MD 08/06/15 2127

## 2015-08-06 NOTE — ED Provider Notes (Signed)
CSN: [id number]     Arrival date & time 08/06/15  2010 History   First MD Initiated Contact with Patient 08/06/15 2016     Chief Complaint  Patient presents with  . Abdominal Pain    Patient is a 54 y.o. female presenting with abdominal pain. The history is provided by the patient.  Abdominal Pain Pain location:  RLQ Pain quality: aching   Pain severity:  Moderate Onset quality:  Gradual Duration: several days. Timing:  Constant Progression:  Worsening Chronicity:  New Relieved by:  Nothing Worsened by:  Movement and palpation Associated symptoms: fatigue and nausea   Associated symptoms: no chest pain and no shortness of breath     Past Medical History  Diagnosis Date  . Grave's disease   . DUB (dysfunctional uterine bleeding)   . Palpitations   . Hypertension   . Hyperlipidemia   . Depression   . GERD (gastroesophageal reflux disease)   . Migraines    Past Surgical History  Procedure Laterality Date  . Vesicovaginal fistula closure w/ tah      Dr. Margaretha Glassing  2 degree DUB   . Tonsillectomy    . Vaginal hysterectomy     Family History  Problem Relation Age of Onset  . Hypertension Mother   . Irregular heart beat Mother   . Thyroid disease Maternal Grandmother   . Cancer Maternal Grandmother     lung  . Cancer Paternal Grandfather     lung   Social History  Substance Use Topics  . Smoking status: Never Smoker   . Smokeless tobacco: Never Used  . Alcohol Use: No   OB History    Gravida Para Term Preterm AB TAB SAB Ectopic Multiple Living   3 3 3       3      Review of Systems  Constitutional: Positive for fatigue.  Respiratory: Negative for shortness of breath.   Cardiovascular: Negative for chest pain.  Gastrointestinal: Positive for nausea and abdominal pain.  All other systems reviewed and are negative.     Allergies  Labetalol and Morphine and related  Home Medications   Prior to Admission medications   Medication Sig Start Date End Date  Taking? Authorizing Provider  cetirizine (ZYRTEC) 10 MG tablet Take 1 tablet (10 mg total) by mouth daily. 06/19/14   [name] Hassell Done, FNP  ciprofloxacin (CIPRO) 500 MG tablet Take 1 tablet (500 mg total) by mouth 2 (two) times daily. 08/06/15   [name] Hassell Done, FNP  escitalopram (LEXAPRO) 10 MG tablet TAKE ONE (1) TABLET EACH DAY 06/18/15   [name] Hassell Done, FNP  fenofibrate 160 MG tablet TAKE ONE (1) TABLET EACH DAY 06/18/15   [name] Hassell Done, FNP  levothyroxine (SYNTHROID, LEVOTHROID) 75 MCG tablet Take 1 tablet (75 mcg total) by mouth daily. 06/18/15   [name] Hassell Done, FNP  metoprolol succinate (TOPROL-XL) 50 MG 24 hr tablet TAKE ONE (1) TABLET EACH DAY 06/18/15   [name] Hassell Done, FNP  metroNIDAZOLE (FLAGYL) 500 MG tablet Take 1 tablet (500 mg total) by mouth 2 (two) times daily. 08/06/15   [name] Hassell Done, FNP  pantoprazole (PROTONIX) 40 MG tablet Take 1 tablet (40 mg total) by mouth as needed. 06/18/15   [name] Hassell Done, FNP  polyethylene glycol powder (GLYCOLAX/MIRALAX) powder Take 17 g by mouth daily. 02/10/15   Tiffany A Gann, PA-C  valsartan-hydrochlorothiazide (DIOVAN-HCT) 160-12.5 MG per tablet Take 1 tablet by mouth daily. 06/18/15   [name] Hassell Done, FNP   BP 133/76 mmHg  Pulse 72  Temp(Src) 98 F (36.7 C) (Oral)  Resp 18  Ht 5' 10.5" (1.791 m)  Wt 229 lb (103.874 kg)  BMI 32.38 kg/m2  SpO2 100%  LMP 05/23/1995 Physical Exam CONSTITUTIONAL: Well developed/well nourished HEAD: Normocephalic/atraumatic EYES: EOMI/PERRL ENMT: Mucous membranes moist NECK: supple no meningeal signs SPINE/BACK:entire spine nontender CV: S1/S2 noted, no murmurs/rubs/gallops noted LUNGS: Lungs are clear to auscultation bilaterally, no apparent distress ABDOMEN: soft, moderate RLQ tenderness, no rebound or guarding, bowel sounds noted throughout abdomen GU:no cva tenderness NEURO: Pt is awake/alert/appropriate, moves all extremitiesx4.  No facial droop.    EXTREMITIES: pulses normal/equal, full ROM SKIN: warm, color normal PSYCH: no abnormalities of mood noted, alert and oriented to situation  ED Course  Procedures   8:45 PM Pt found by CT imaging to have acute appendicitis with perforation Will consult surgery IV zosyn ordered for patient 9:11 PM D/w dr Arnoldo Morale Due to perforation, she requires IV antibiotics and possible IR evaluation for percutaneous drain He recommends medical admit D/w dr Anastasio Champion, will admit  Pt with small amt of hives after zofran, benadryl Pt stable, no distress otherwise  Medications  ondansetron (ZOFRAN) injection 4 mg (not administered)  diphenhydrAMINE (BENADRYL) injection 25 mg (not administered)  piperacillin-tazobactam (ZOSYN) IVPB 3.375 g (3.375 g Intravenous New Bag/Given 08/06/15 2039)  sodium chloride 0.9 % bolus 1,000 mL (1,000 mLs Intravenous New Bag/Given 08/06/15 2039)    Labs Review Labs Reviewed  CBC - Abnormal; Notable for the following:    WBC 12.8 (*)    All other components within normal limits  BASIC METABOLIC PANEL - Abnormal; Notable for the following:    Potassium 3.3 (*)    Chloride 99 (*)    BUN 22 (*)    Creatinine, Ser 1.09 (*)    GFR calc non Af Amer 57 (*)    All other components within normal limits  PROTIME-INR  APTT  TYPE AND SCREEN    Imaging Review Dg Abd 1 View  08/06/2015   CLINICAL DATA:  Abdominal pain.  EXAM: ABDOMEN - 1 VIEW  COMPARISON:  02/10/2015.  FINDINGS: Soft tissue structures are unremarkable. No bowel distention. No free air. No prominent stool burden. No acute bony abnormality. Degenerative changes lumbar spine and both hips.  IMPRESSION: No acute abnormality.  Stable exam from 02/10/2015.   Electronically Signed   By: [place]   On: 08/06/2015 17:00   Ct Abdomen Pelvis W Contrast  08/06/2015   CLINICAL DATA:  Generalized abdominal pain with nausea 1 week. Right lower quadrant pain 1 day with constipation. Low-grade fever.  EXAM: CT  ABDOMEN AND PELVIS WITH CONTRAST  TECHNIQUE: Multidetector CT imaging of the abdomen and pelvis was performed using the standard protocol following bolus administration of intravenous contrast.  CONTRAST:  121mL OMNIPAQUE IOHEXOL 300 MG/ML  SOLN  COMPARISON:  None.  FINDINGS: Lung bases demonstrate a few small pulmonary nodules with the largest measuring 5 mm over the right middle lobe.  Abdominal images demonstrate a normal liver, spleen, pancreas, gallbladder and adrenal glands. Kidneys are normal in size without evidence of nephrolithiasis. There is 2 sub cm left renal cortical hypodensities too small to characterize but likely a cysts. There is minimal prominence of the right intrarenal collecting system and ureter. Ureters are otherwise within normal without evidence of stones.  Within the right lower quadrant there is a rim enhancing fluid collection abutting the cecal tip measuring approximately 3.7 x 4.4 cm likely sequelae from acute appendicitis with contained rupture and periappendiceal  abscess. There is secondary wall thickening of an immediately adjacent small bowel loop. Minimal wall thickening of the adjacent terminal ileum. No evidence of free air. There is mild adjacent inflammatory change and minimal free fluid in the right pericolic gutter. Mild to moderate free fluid over the pelvis. Multiple small adjacent mesenteric lymph nodes in the right lower quadrant.  Remainder of the colon and small bowel are within normal.  Vascular structures are within normal.  Remaining pelvic images demonstrate the bladder and rectum to be within normal. Ovaries are unremarkable.  There are minimal degenerate changes of the spine and hips.  IMPRESSION: Findings as described in the right lower quadrant likely representing acute appendicitis with contained perforation and periappendiceal abscess formation with abscess measuring approximately 3.7 x 4.4 cm. No free air.  Two small sub cm left renal cortical hypodensities  too small to characterize, but likely cysts.  A few small bilateral pulmonary nodules with the largest measuring 5 mm over the right middle lobe. Recommend followup chest CT 6 months. This recommendation follows the consensus statement: Guidelines for Management of Small Pulmonary Nodules Detected on CT Scans: A Statement from the [facility] as published in Radiology 2005; 237:395-400. Online at: https://www.arnold.com/.  Minimal prominence of the right intrarenal collecting system and ureter likely secondary to the right lower infectious process.  PRA in process of contacting patient's physician.   Electronically Signed   By: Marin Olp M.D.   On: 08/06/2015 19:55   I have personally reviewed and evaluated these  lab results as part of my medical decision-making.    MDM   Final diagnoses:  Acute appendicitis, unspecified acute appendicitis type    Nursing notes including past medical history and social history reviewed and considered in documentation Labs/vital reviewed myself and considered during evaluation     Ripley Fraise, MD 08/06/15 2115

## 2015-08-06 NOTE — ED Notes (Signed)
Patient states abdominal pain since Friday. Back pain for approximately 2 weeks. Patient sent by PCP, had CT and states was told her appendix has ruptured. Has IV present in right AC.

## 2015-08-06 NOTE — Progress Notes (Signed)
ANTIBIOTIC CONSULT NOTE-Preliminary  Pharmacy Consult for Zosyn Indication: intraabdominal infection  Allergies  Allergen Reactions  . Labetalol   . Morphine And Related Itching    Patient Measurements: Height: 5' 10.5" (179.1 cm) Weight: 229 lb (103.874 kg) IBW/kg (Calculated) : 69.65  Vital Signs: Temp: 98 F (36.7 C) (09/14 2022) Temp Source: Oral (09/14 2022) BP: 133/76 mmHg (09/14 2022) Pulse Rate: 72 (09/14 2022)  Labs:  Recent Labs  08/06/15 2030  WBC 12.8*  HGB 12.9  PLT 290  CREATININE 1.09*    Estimated Creatinine Clearance: 78.6 mL/min (by C-G formula based on Cr of 1.09).  No results for input(s): VANCOTROUGH, VANCOPEAK, VANCORANDOM, GENTTROUGH, GENTPEAK, GENTRANDOM, TOBRATROUGH, TOBRAPEAK, TOBRARND, AMIKACINPEAK, AMIKACINTROU, AMIKACIN in the last 72 hours.   Microbiology: No results found for this or any previous visit (from the past 720 hour(s)).  Medical History: Past Medical History  Diagnosis Date  . Grave's disease   . DUB (dysfunctional uterine bleeding)   . Palpitations   . Hypertension   . Hyperlipidemia   . Depression   . GERD (gastroesophageal reflux disease)   . Migraines    Assessment: OK for protocol.  Estimated Creatinine Clearance: 78.6 mL/min (by C-G formula based on Cr of 1.09).  Plan:  Preliminary review of pertinent patient information completed.  Protocol will be initiated Zosyn 3.375gm.  Forestine Na clinical pharmacist will complete review during morning rounds to assess patient and finalize treatment regimen.  Ena Dawley, [facility] 08/06/2015,10:20 PM

## 2015-08-06 NOTE — H&P (Addendum)
[facility] History and Physical  Casey [name] FOY:[id number] DOB: 01-Mar-1961 DOA: 08/06/2015  Referring physician: ER PCP: Chevis Pretty, FNP   Chief Complaint: Abdominal pain  HPI: Casey [name] is a 54 y.o. female  This is a very pleasant 54 year old lady who gives a two-week history of central abdominal pain and more localizing in the right lower quadrant today. It has been associated with nausea but no significant vomiting. She has been feeling hot and cold as if she had a fever. There is no diarrhea. There is no rectal bleeding or hematemesis. She went to see her primary care physician's office and was referred to the emergency room. Evaluation in the emergency room with CT scan showed a ruptured appendicitis with abscess formation. She is now being admitted for further management. She is hypertensive.   Review of Systems:  Apart from symptoms above, all systems negative.  Past Medical History  Diagnosis Date  . Grave's disease   . DUB (dysfunctional uterine bleeding)   . Palpitations   . Hypertension   . Hyperlipidemia   . Depression   . GERD (gastroesophageal reflux disease)   . Migraines    Past Surgical History  Procedure Laterality Date  . Vesicovaginal fistula closure w/ tah      Dr. Margaretha Glassing  2 degree DUB   . Tonsillectomy    . Vaginal hysterectomy     Social History:  reports that she has never smoked. She has never used smokeless tobacco. She reports that she does not drink alcohol or use illicit drugs.  Allergies  Allergen Reactions  . Labetalol   . Morphine And Related Itching    Family History  Problem Relation Age of Onset  . Hypertension Mother   . Irregular heart beat Mother   . Thyroid disease Maternal Grandmother   . Cancer Maternal Grandmother     lung  . Cancer Paternal Grandfather     lung    Prior to Admission medications   Medication Sig Start Date End Date Taking? Authorizing Provider  cetirizine (ZYRTEC) 10 MG  tablet Take 1 tablet (10 mg total) by mouth daily. 06/19/14   [name] Hassell Done, FNP  ciprofloxacin (CIPRO) 500 MG tablet Take 1 tablet (500 mg total) by mouth 2 (two) times daily. 08/06/15   [name] Hassell Done, FNP  escitalopram (LEXAPRO) 10 MG tablet TAKE ONE (1) TABLET EACH DAY 06/18/15   [name] Hassell Done, FNP  fenofibrate 160 MG tablet TAKE ONE (1) TABLET EACH DAY 06/18/15   [name] Hassell Done, FNP  levothyroxine (SYNTHROID, LEVOTHROID) 75 MCG tablet Take 1 tablet (75 mcg total) by mouth daily. 06/18/15   [name] Hassell Done, FNP  metoprolol succinate (TOPROL-XL) 50 MG 24 hr tablet TAKE ONE (1) TABLET EACH DAY 06/18/15   [name] Hassell Done, FNP  metroNIDAZOLE (FLAGYL) 500 MG tablet Take 1 tablet (500 mg total) by mouth 2 (two) times daily. 08/06/15   [name] Hassell Done, FNP  pantoprazole (PROTONIX) 40 MG tablet Take 1 tablet (40 mg total) by mouth as needed. 06/18/15   [name] Hassell Done, FNP  polyethylene glycol powder (GLYCOLAX/MIRALAX) powder Take 17 g by mouth daily. 02/10/15   Tiffany A Gann, PA-C  valsartan-hydrochlorothiazide (DIOVAN-HCT) 160-12.5 MG per tablet Take 1 tablet by mouth daily. 06/18/15   [name] Hassell Done, FNP   Physical Exam: Filed Vitals:   08/06/15 2022  BP: 133/76  Pulse: 72  Temp: 98 F (36.7 C)  TempSrc: Oral  Resp: 18  Height: 5' 10.5" (1.791 m)  Weight: 103.874 kg (229 lb)  SpO2: 100%  Wt Readings from Last 3 Encounters:  08/06/15 103.874 kg (229 lb)  08/06/15 104.962 kg (231 lb 6.4 oz)  07/04/15 103.42 kg (228 lb)    General:  Appears in pain on minimal movement. She is afebrile. She is nontoxic. Eyes: PERRL, normal lids, irises & conjunctiva ENT: grossly normal hearing, lips & tongue Neck: no LAD, masses or thyromegaly Cardiovascular: RRR, no m/r/g. No LE edema. Telemetry: SR, no arrhythmias  Respiratory: CTA bilaterally, no w/r/r. Normal respiratory effort. Abdomen: soft, but clearly tender in the right lower quadrant with  guarding and rebound tenderness. Bowel sounds are very scanty/not heard. Skin: no rash or induration seen on limited exam Musculoskeletal: grossly normal tone BUE/BLE Psychiatric: grossly normal mood and affect, speech fluent and appropriate Neurologic: grossly non-focal.          Labs on Admission:  Basic Metabolic Panel:  Recent Labs Lab 08/06/15 2030  NA 137  K 3.3*  CL 99*  CO2 29  GLUCOSE 99  BUN 22*  CREATININE 1.09*  CALCIUM 9.1   Liver Function Tests: No results for input(s): AST, ALT, ALKPHOS, BILITOT, PROT, ALBUMIN in the last 168 hours. No results for input(s): LIPASE, AMYLASE in the last 168 hours. No results for input(s): AMMONIA in the last 168 hours. CBC:  Recent Labs Lab 08/06/15 2030  WBC 12.8*  HGB 12.9  HCT 38.8  MCV 88.4  PLT 290   Cardiac Enzymes: No results for input(s): CKTOTAL, CKMB, CKMBINDEX, TROPONINI in the last 168 hours.  BNP (last 3 results) No results for input(s): BNP in the last 8760 hours.  ProBNP (last 3 results) No results for input(s): PROBNP in the last 8760 hours.  CBG: No results for input(s): GLUCAP in the last 168 hours.  Radiological Exams on Admission: Dg Abd 1 View  08/06/2015   CLINICAL DATA:  Abdominal pain.  EXAM: ABDOMEN - 1 VIEW  COMPARISON:  02/10/2015.  FINDINGS: Soft tissue structures are unremarkable. No bowel distention. No free air. No prominent stool burden. No acute bony abnormality. Degenerative changes lumbar spine and both hips.  IMPRESSION: No acute abnormality.  Stable exam from 02/10/2015.   Electronically Signed   By: Marcello Moores  Register   On: 08/06/2015 17:00   Ct Abdomen Pelvis W Contrast  08/06/2015   ADDENDUM REPORT: 08/06/2015 21:43 ADDENDUM: Critical Value/emergent results were called by telephone at the time of interpretation on 08/06/2015 at 7:45 pm to Dr. Vonna Kotyk Dettinger, who verbally acknowledged these results. Electronically Signed   By: Marin Olp M.D.   On: 08/06/2015  21:43  08/06/2015   CLINICAL DATA:  Generalized abdominal pain with nausea 1 week. Right lower quadrant pain 1 day with constipation. Low-grade fever.  EXAM: CT ABDOMEN AND PELVIS WITH CONTRAST  TECHNIQUE: Multidetector CT imaging of the abdomen and pelvis was performed using the standard protocol following bolus administration of intravenous contrast.  CONTRAST:  113mL OMNIPAQUE IOHEXOL 300 MG/ML  SOLN  COMPARISON:  None.  FINDINGS: Lung bases demonstrate a few small pulmonary nodules with the largest measuring 5 mm over the right middle lobe.  Abdominal images demonstrate a normal liver, spleen, pancreas, gallbladder and adrenal glands. Kidneys are normal in size without evidence of nephrolithiasis. There is 2 sub cm left renal cortical hypodensities too small to characterize but likely a cysts. There is minimal prominence of the right intrarenal collecting system and ureter. Ureters are otherwise within normal without evidence of stones.  Within the right lower quadrant there is a rim enhancing fluid collection  abutting the cecal tip measuring approximately 3.7 x 4.4 cm likely sequelae from acute appendicitis with contained rupture and periappendiceal abscess. There is secondary wall thickening of an immediately adjacent small bowel loop. Minimal wall thickening of the adjacent terminal ileum. No evidence of free air. There is mild adjacent inflammatory change and minimal free fluid in the right pericolic gutter. Mild to moderate free fluid over the pelvis. Multiple small adjacent mesenteric lymph nodes in the right lower quadrant.  Remainder of the colon and small bowel are within normal.  Vascular structures are within normal.  Remaining pelvic images demonstrate the bladder and rectum to be within normal. Ovaries are unremarkable.  There are minimal degenerate changes of the spine and hips.  IMPRESSION: Findings as described in the right lower quadrant likely representing acute appendicitis with contained  perforation and periappendiceal abscess formation with abscess measuring approximately 3.7 x 4.4 cm. No free air.  Two small sub cm left renal cortical hypodensities too small to characterize, but likely cysts.  A few small bilateral pulmonary nodules with the largest measuring 5 mm over the right middle lobe. Recommend followup chest CT 6 months. This recommendation follows the consensus statement: Guidelines for Management of Small Pulmonary Nodules Detected on CT Scans: A Statement from the [employer] as published in Radiology 2005; 237:395-400. Online at: https://www.arnold.com/.  Minimal prominence of the right intrarenal collecting system and ureter likely secondary to the right lower infectious process.  PRA in process of contacting patient's physician.  Electronically Signed: By: Marin Olp M.D. On: 08/06/2015 19:55      Assessment/Plan   1. Acute appendicitis with rupture. She will be treated with nothing by mouth, IV antibiotics and conservatively with interventional radiology insertion of drain. Dr. Arnoldo Morale, surgery, is aware of the patient and will see the patient tomorrow. 2. Hypertension. Stable. Use IV hydralazine when necessary as patient is nothing by mouth.  Further recommendations will depend on patient's hospital progress.   Code Status: Full code.  DVT Prophylaxis: SCDs.  Family Communication: I discussed the plan with the patient and patient's husband at the bedside.  Disposition Plan: Home when medically stable.   Time spent: 60 minutes.  Doree Albee [facility] Pager [phone].  Addendum: Please note that the patient was given 1 dose of Zosyn and there was a suspicion this was an allergic reaction but on closer questioning of the patient, I'm not convinced that this was an allergic reaction. She has tolerated penicillin  before. I will go ahead and continue Zosyn and if there is a another episode of possible  reaction, then we will stop this medication and use alternative antibiotics such as Levaquin and metronidazole.

## 2015-08-06 NOTE — Progress Notes (Addendum)
   Subjective:    Patient ID: Casey [name], female    DOB: 22-Jun-1961, 54 y.o.   MRN: [id number]  HPI Patient in today c/o diffuse abdominal pain- started about 2 weeks ago- occurs intermittently - rates pain 5/10. Having slight back pain. Nausea at times. Does not feel good. Doesn't go to bathroom like she use to.    Review of Systems  Constitutional: Negative.   HENT: Negative.   Respiratory: Negative.   Cardiovascular: Negative.   Gastrointestinal: Negative.   Endocrine: Positive for cold intolerance.  Genitourinary: Negative.   Neurological: Negative.   Psychiatric/Behavioral: Negative.   All other systems reviewed and are negative.      Objective:   Physical Exam  Constitutional: She is oriented to person, place, and time. She appears well-developed and well-nourished.  Cardiovascular: Normal rate, regular rhythm and normal heart sounds.   Pulmonary/Chest: Effort normal and breath sounds normal.  Abdominal: Soft. Bowel sounds are normal. There is tenderness (diffuse tenderness with increase pain in RLQ). There is guarding.  Neurological: She is alert and oriented to person, place, and time.  Skin: Skin is warm and dry.  Psychiatric: She has a normal mood and affect. Her behavior is normal. Judgment and thought content normal.   BP 102/71 mmHg  Pulse 77  Temp(Src) 97.8 F (36.6 C) (Oral)  Ht 5\' 9"  (1.753 m)  Wt 231 lb 6.4 oz (104.962 kg)  BMI 34.16 kg/m2  LMP 05/23/1995  KUB- NOrmal-Preliminary reading by Ronnald Collum, FNP  [facility]        Assessment & Plan:   1. Generalized abdominal pain   2. Nausea   3. Low grade fever    Ordered CT scan of abdomen with contrast- stat NPO until test is done RTO prn  [name] Hassell Done, FNP

## 2015-08-07 ENCOUNTER — Encounter (HOSPITAL_COMMUNITY): Payer: Self-pay | Admitting: Family Medicine

## 2015-08-07 ENCOUNTER — Encounter (HOSPITAL_COMMUNITY): Payer: Self-pay

## 2015-08-07 ENCOUNTER — Ambulatory Visit (HOSPITAL_COMMUNITY)
Admission: RE | Admit: 2015-08-07 | Discharge: 2015-08-07 | Disposition: A | Discharge status: Home / Self Care | Payer: Commercial Managed Care - HMO | Source: Ambulatory Visit | Attending: Internal Medicine | Admitting: Internal Medicine

## 2015-08-07 DIAGNOSIS — K352 Acute appendicitis with generalized peritonitis: Secondary | ICD-10-CM

## 2015-08-07 LAB — COMPREHENSIVE METABOLIC PANEL
ALT: 17 U/L (ref 14–54)
AST: 16 U/L (ref 15–41)
Albumin: 3.3 g/dL — ABNORMAL LOW (ref 3.5–5.0)
Alkaline Phosphatase: 29 U/L — ABNORMAL LOW (ref 38–126)
Anion gap: 6 (ref 5–15)
BILIRUBIN TOTAL: 0.4 mg/dL (ref 0.3–1.2)
BUN: 18 mg/dL (ref 6–20)
CO2: 30 mmol/L (ref 22–32)
CREATININE: 1.04 mg/dL — AB (ref 0.44–1.00)
Calcium: 8.6 mg/dL — ABNORMAL LOW (ref 8.9–10.3)
Chloride: 107 mmol/L (ref 101–111)
GFR calc Af Amer: >60 mL/min (ref >60)
Glucose, Bld: 98 mg/dL (ref 65–99)
Potassium: 3.6 mmol/L (ref 3.5–5.1)
Sodium: 143 mmol/L (ref 135–145)
TOTAL PROTEIN: 6.1 g/dL — AB (ref 6.5–8.1)

## 2015-08-07 LAB — CBC
HCT: 35 % — ABNORMAL LOW (ref 36.0–46.0)
Hemoglobin: 11.4 g/dL — ABNORMAL LOW (ref 12.0–15.0)
MCH: 28.9 pg (ref 26.0–34.0)
MCHC: 32.6 g/dL (ref 30.0–36.0)
MCV: 88.6 fL (ref 78.0–100.0)
PLATELETS: 230 10*3/uL (ref 150–400)
RBC: 3.95 MIL/uL (ref 3.87–5.11)
RDW: 12.8 % (ref 11.5–15.5)
WBC: 10.1 10*3/uL (ref 4.0–10.5)

## 2015-08-07 IMAGING — CT CT IMAGE GUIDED FLUID DRAIN BY CATHETER
1 of 4 series · 8 of 32 positions shown, 13 images · non-contrast
Comparison: none

CLINICAL DATA: 53-year-old female with current admission for
ruptured appendicitis with associated abscess. She has been referred
for drainage.

[Series 5: i-spiral 5.0 b40f · axial · 0.96mm/px · z∈[-396,-274]mm · 8 of 45 slices shown, 13 images]
[im 5/45  soft-tissue]
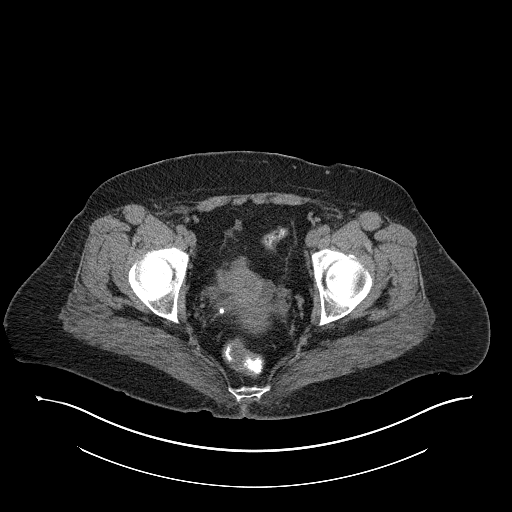
[im 5/45  bone]
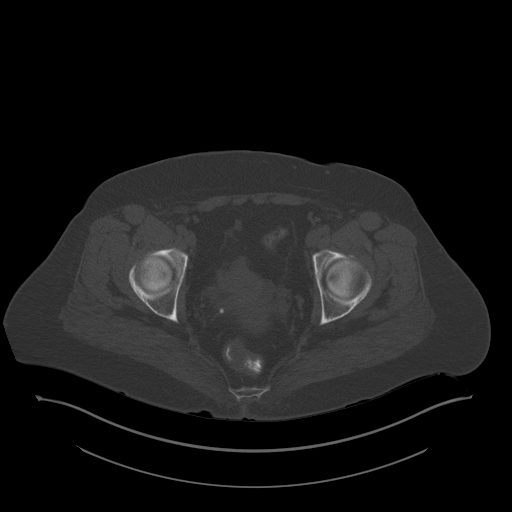
[im 10/45  soft-tissue]
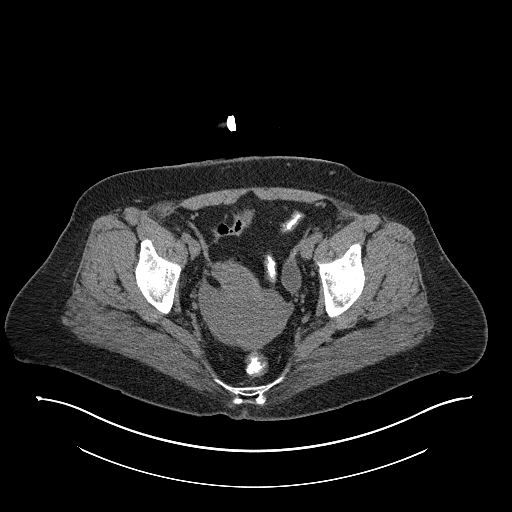
[im 15/45  soft-tissue]
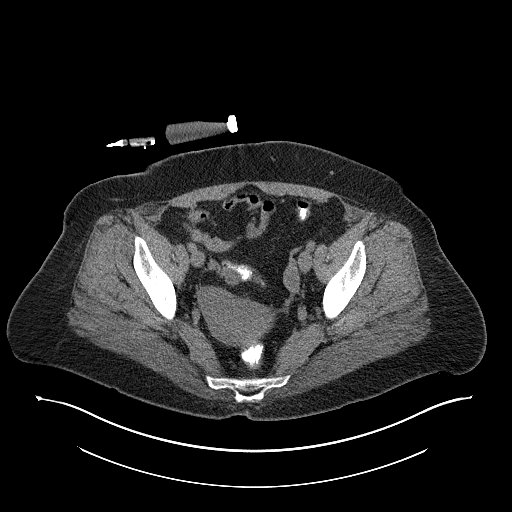
[im 20/45  soft-tissue]
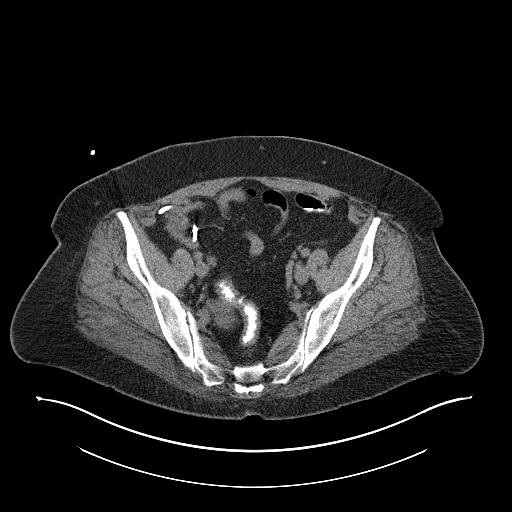
[im 25/45  soft-tissue]
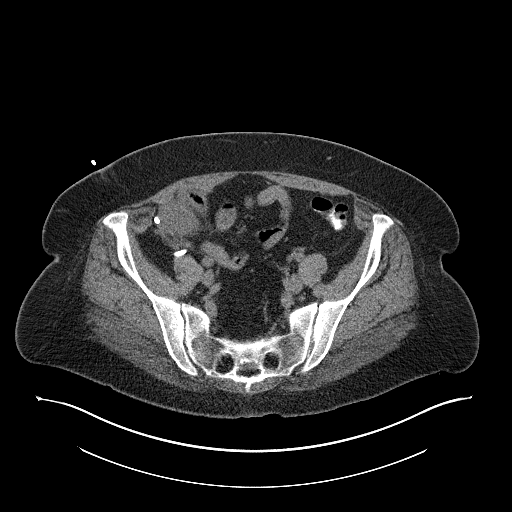
[im 25/45  lung]
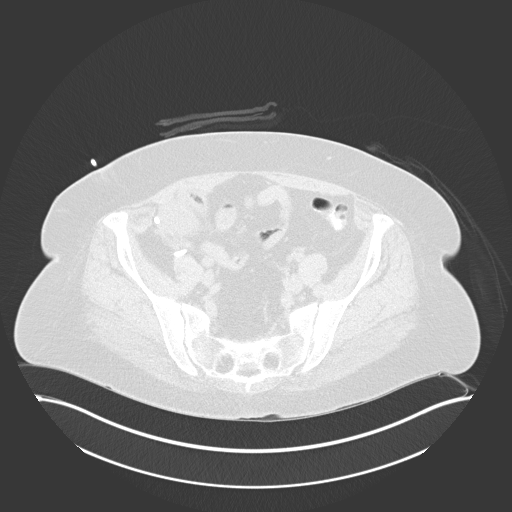
[im 30/45  soft-tissue]
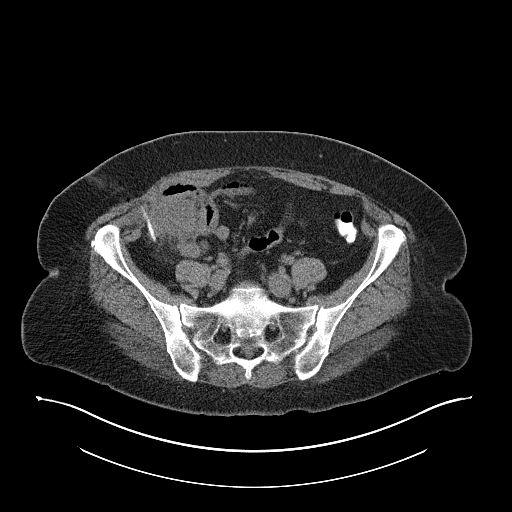
[im 30/45  lung]
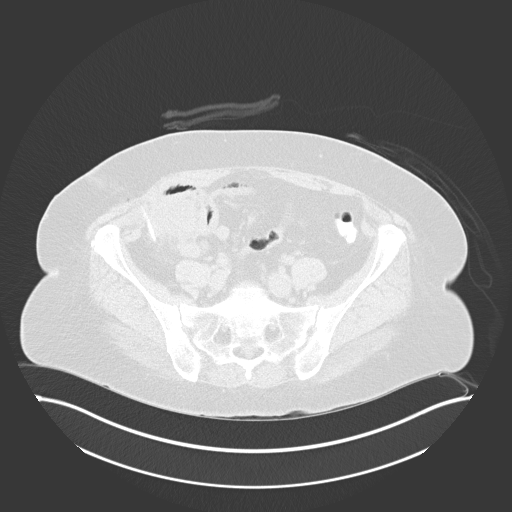
[im 35/45  soft-tissue]
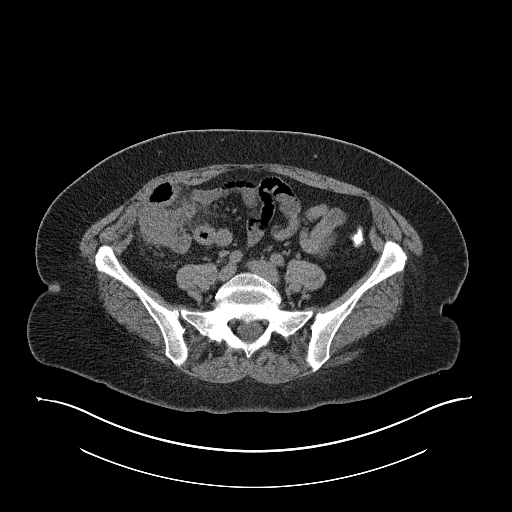
[im 35/45  lung]
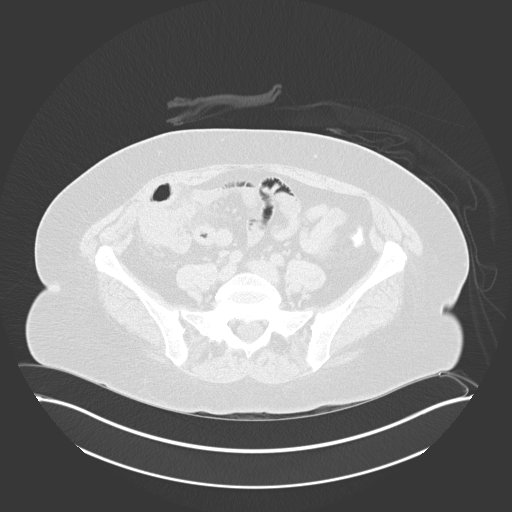
[im 40/45  soft-tissue]
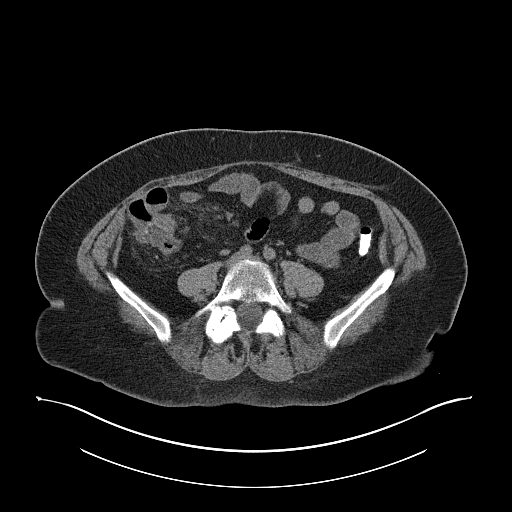
[im 40/45  lung]
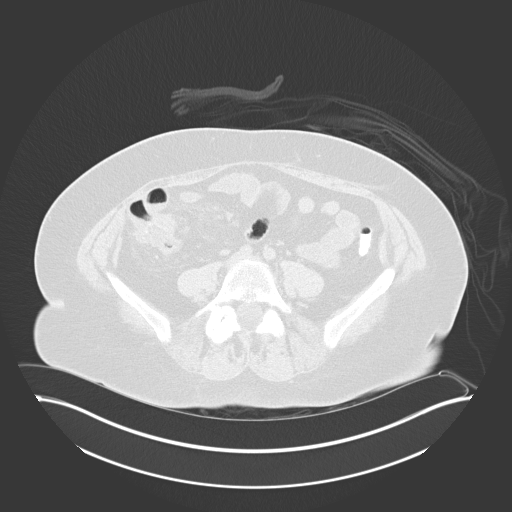

[8 of 32 positions shown; findings below may reference images not displayed]

EXAM:
CT GUIDED PERIAPPENDICEAL ABSCESS DRAINAGE

ANESTHESIA/SEDATION:
2.0  Mg IV Versed; 150 mcg IV Fentanyl

Total Moderate Sedation Time: 15 minutes.

PROCEDURE:
The procedure risks, benefits, and alternatives were explained to
the patient and the patient's family. Questions regarding the
procedure were encouraged and answered. The patient understands and
consents to the procedure.

Patient was positioned in the supine position on the CT table and a
scout CT of the pelvis was acquired.

The right lower quadrant was prepped with Betadinein a sterile
fashion, and a sterile drape was applied covering the operative
field. A sterile gown and sterile gloves were used for the
procedure. Local anesthesia was provided with 1% Lidocaine.

Once the patient is prepped and draped sterilely, the skin and
subcutaneous tissues overlying the right iliac bone were generously
infiltrated 1% lidocaine for local anesthesia. Using CT guidance, a
trocar needle was advanced into the fluid collection adjacent to the
cecum. Aspiration of fluid confirmed location as well as CT imaging.

Using modified Seldinger technique, a 10 French drain was placed
over the wire. Aspiration of approximately 70 cc of amber thin
fluid. Sample was sent for cytology and for culture.

The drain was locked in position and a suture was placed.

Patient tolerated the procedure well and remained hemodynamically
stable throughout.

No complications were encountered and no significant blood loss was
encountered.

COMPLICATIONS:
None
FINDINGS: Scout CT demonstrates fluid collection lateral to the cecum in the
region of the presumed appendiceal rupture.

Images during the case demonstrate pigtail catheter within the fluid
collection, collapsed after aspiration of approximately 70 cc of
amber fluid.
IMPRESSION: Status post CT-guided drainage of periappendiceal fluid collection.
Sample sent to the lab for culture and for cytology.

## 2015-08-07 MED ORDER — ACETAMINOPHEN 325 MG PO TABS: 650.0000 mg | ORAL_TABLET | Freq: Four times a day (QID) | ORAL | Status: DC | PRN

## 2015-08-07 MED ORDER — SODIUM CHLORIDE 0.9 % IV SOLN
INTRAVENOUS | Status: DC | PRN
  Administered 2015-08-07: 10 mL/h via INTRAVENOUS

## 2015-08-07 MED ORDER — PIPERACILLIN-TAZOBACTAM 3.375 G IVPB
INTRAVENOUS | Status: AC
  Filled 2015-08-07: qty 50

## 2015-08-07 MED ORDER — OXYCODONE HCL 5 MG PO TABS
5.0000 mg | ORAL_TABLET | ORAL | Status: DC | PRN
  Administered 2015-08-07 – 2015-08-08 (×4): 5 mg via ORAL
  Filled 2015-08-07 (×4): qty 1

## 2015-08-07 MED ORDER — FENTANYL CITRATE (PF) 100 MCG/2ML IJ SOLN
INTRAMUSCULAR | Status: AC
  Filled 2015-08-07: qty 2

## 2015-08-07 MED ORDER — MORPHINE SULFATE (PF) 2 MG/ML IV SOLN: 2.0000 mg | INTRAVENOUS | Status: DC | PRN

## 2015-08-07 MED ORDER — METOPROLOL SUCCINATE ER 50 MG PO TB24
50.0000 mg | ORAL_TABLET | Freq: Every day | ORAL | Status: DC
  Administered 2015-08-08 – 2015-08-09 (×2): 50 mg via ORAL
  Filled 2015-08-07 (×2): qty 1

## 2015-08-07 MED ORDER — MIDAZOLAM HCL 2 MG/2ML IJ SOLN
INTRAMUSCULAR | Status: DC
Start: 2015-08-07 — End: 2015-08-08
  Filled 2015-08-07: qty 2

## 2015-08-07 MED ORDER — MIDAZOLAM HCL 2 MG/2ML IJ SOLN
INTRAMUSCULAR | Status: AC | PRN
  Administered 2015-08-07: 1 mg via INTRAVENOUS
  Administered 2015-08-07 (×2): 0.5 mg via INTRAVENOUS

## 2015-08-07 MED ORDER — VALSARTAN-HYDROCHLOROTHIAZIDE 160-12.5 MG PO TABS: 1.0000 | ORAL_TABLET | Freq: Every day | ORAL | Status: DC

## 2015-08-07 MED ORDER — IRBESARTAN 150 MG PO TABS
150.0000 mg | ORAL_TABLET | Freq: Every day | ORAL | Status: DC
  Administered 2015-08-08 – 2015-08-09 (×2): 150 mg via ORAL
  Filled 2015-08-07 (×2): qty 1

## 2015-08-07 MED ORDER — LIDOCAINE HCL 1 % IJ SOLN
INTRAMUSCULAR | Status: AC
  Filled 2015-08-07: qty 20

## 2015-08-07 MED ORDER — HYDROMORPHONE HCL 1 MG/ML IJ SOLN
0.5000 mg | INTRAMUSCULAR | Status: DC | PRN
  Administered 2015-08-07 – 2015-08-08 (×4): 0.5 mg via INTRAVENOUS
  Filled 2015-08-07 (×4): qty 1

## 2015-08-07 MED ORDER — HYDROCHLOROTHIAZIDE 12.5 MG PO CAPS
12.5000 mg | ORAL_CAPSULE | Freq: Every day | ORAL | Status: DC
  Administered 2015-08-08 – 2015-08-09 (×2): 12.5 mg via ORAL
  Filled 2015-08-07 (×2): qty 1

## 2015-08-07 MED ORDER — ESCITALOPRAM OXALATE 10 MG PO TABS
10.0000 mg | ORAL_TABLET | Freq: Every day | ORAL | Status: DC
  Administered 2015-08-07 – 2015-08-09 (×3): 10 mg via ORAL
  Filled 2015-08-07 (×3): qty 1

## 2015-08-07 MED ORDER — LEVOTHYROXINE SODIUM 75 MCG PO TABS
75.0000 ug | ORAL_TABLET | Freq: Every day | ORAL | Status: DC
  Administered 2015-08-08 – 2015-08-09 (×2): 75 ug via ORAL
  Filled 2015-08-07 (×2): qty 1

## 2015-08-07 MED ORDER — LORATADINE 10 MG PO TABS
10.0000 mg | ORAL_TABLET | Freq: Every day | ORAL | Status: DC
  Administered 2015-08-07 – 2015-08-09 (×3): 10 mg via ORAL
  Filled 2015-08-07 (×3): qty 1

## 2015-08-07 MED ORDER — MIDAZOLAM HCL 2 MG/2ML IJ SOLN
INTRAMUSCULAR | Status: AC
  Filled 2015-08-07: qty 2

## 2015-08-07 MED ORDER — FENTANYL CITRATE (PF) 100 MCG/2ML IJ SOLN
INTRAMUSCULAR | Status: AC | PRN
  Administered 2015-08-07: 25 ug via INTRAVENOUS
  Administered 2015-08-07 (×2): 50 ug via INTRAVENOUS
  Administered 2015-08-07: 25 ug via INTRAVENOUS

## 2015-08-07 NOTE — Progress Notes (Signed)
ANTIBIOTIC CONSULT NOTE - FOLLOW UP  Pharmacy Consult for Zosyn Indication: intra-abdominal infection  Allergies  Allergen Reactions  . Zosyn [Piperacillin Sod-Tazobactam So] Rash  . Labetalol Itching  . Morphine And Related Itching   Patient Measurements: Height: 5\' 10"  (177.8 cm) Weight: 231 lb 3.2 oz (104.872 kg) IBW/kg (Calculated) : 68.5  Vital Signs: Temp: 98.2 F (36.8 C) (09/15 0614) Temp Source: Oral (09/15 2536) BP: 116/64 mmHg (09/15 6440) Pulse Rate: 77 (09/15 0614) Intake/Output from previous day: 09/14 0701 - 09/15 0700 In: 805 [I.V.:805] Out: -  Intake/Output from this shift:    Labs:  Recent Labs  08/06/15 2030 08/07/15 0513  WBC 12.8* 10.1  HGB 12.9 11.4*  PLT 290 230  CREATININE 1.09* 1.04*   Estimated Creatinine Clearance: 82.1 mL/min (by C-G formula based on Cr of 1.04). No results for input(s): VANCOTROUGH, VANCOPEAK, VANCORANDOM, GENTTROUGH, GENTPEAK, GENTRANDOM, TOBRATROUGH, TOBRAPEAK, TOBRARND, AMIKACINPEAK, AMIKACINTROU, AMIKACIN in the last 72 hours.   Microbiology: No results found for this or any previous visit (from the past 720 hour(s)).  Anti-infectives    Start     Dose/Rate Route Frequency Ordered Stop   08/07/15 0600  piperacillin-tazobactam (ZOSYN) IVPB 3.375 g     3.375 g 12.5 mL/hr over 240 Minutes Intravenous Every 8 hours 08/06/15 2220     08/06/15 2030  piperacillin-tazobactam (ZOSYN) IVPB 3.375 g     3.375 g 100 mL/hr over 30 Minutes Intravenous  Once 08/06/15 2015 08/06/15 2109     Assessment: 54yo female c/o lower abdominal pain.  CT scan of abdomen reveals contained perforated appendicitis.  Zosyn started.  Estimated Creatinine Clearance: 82.1 mL/min (by C-G formula based on Cr of 1.04).  Goal of Therapy:  Eradicate infection.  Plan:  Zosyn 3.375gm IV q8h, each dose over 4 hrs Monitor labs, progress, and c/s  Hart Robinsons A 08/07/2015,11:06 AM

## 2015-08-07 NOTE — Progress Notes (Signed)
  PROGRESS NOTE  Casey [name] MKL:[id number] DOB: 1960-11-28 DOA: 08/06/2015 PCP: Chevis Pretty, FNP  Summary: 51 yofwho presented with complaints of RLQ  abdominal pain and nausea  Evaluation in the emergency room with CT scan showed a ruptured appendicitis with abscess formation. Admitted for further management and evaluation.   Assessment/Plan: 1. Acute appendicitis with contained perforation. General surgery recommends IR drain placement and conservative management. May eat after drain placed. Surgery is not anticipated at this point. 2. Hypertension. Stable.  3. Few small bilateral pulmonary nodules with the largest measuring 5 mm over the right middle lobe. Recommend followup chest CT 6 months.   Plan for IR drain placement today, diet afterwards.  Anticipate discharge within 24 hours.  Code Status: Full DVT prophylaxis: SCDs Family Communication:Husband bedside. Discussed with patient who understands and has no concerns at this time. Disposition Plan: Anticipate discharge within 24 hours.   Murray Hodgkins, MD  [facility]  Pager [phone] If 7PM-7AM, please contact night-coverage at [email or web address], password [facility] 08/07/2015, 6:49 AM  LOS: 1 day   Consultants:  General surgery- Dr Arnoldo Morale  Procedures:    Antibiotics:  Zosyn 9/15  HPI/Subjective: Abdomen is still generally tender. Mild intermittent nausea, no vomiting.    Objective: Filed Vitals:   08/06/15 2022 08/06/15 2215 08/07/15 0614  BP: 133/76 120/59 116/64  Pulse: 72 67 77  Temp: 98 F (36.7 C) 98 F (36.7 C) 98.2 F (36.8 C)  TempSrc: Oral Oral Oral  Resp: 18 18 18   Height: 5' 10.5" (1.791 m) 5\' 10"  (1.778 m)   Weight: 103.874 kg (229 lb) 104.872 kg (231 lb 3.2 oz)   SpO2: 100% 99% 100%    Intake/Output Summary (Last 24 hours) at 08/07/15 0649 Last data filed at 08/07/15 1655  Gross per 24 hour  Intake    805 ml  Output      0 ml  Net    805 ml     Filed Weights   08/06/15  2022 08/06/15 2215  Weight: 103.874 kg (229 lb) 104.872 kg (231 lb 3.2 oz)    Exam:  Afebrile, VSS, not hypoxic  General:  Appears calm and comfortable Cardiovascular: RRR, no m/r/g. No LE edema. Respiratory: CTA bilaterally, no w/r/r. Normal respiratory effort. Abdomen: soft, positive bowel sounds, generalized tenderness to palpation. Psychiatric: grossly normal mood and affect, speech fluent and appropriate  New data reviewed:  WBC normalized 10.1  Hgb stable 11.4  CMP unremarkable   Pertinent data since admission:  CT A/P revealed ruptured appendicitis with abscess formation  Pending data:    Scheduled Meds: . Influenza vac split quadrivalent PF  0.5 mL Intramuscular Tomorrow-1000  . piperacillin-tazobactam (ZOSYN)  IV  3.375 g Intravenous Q8H   Continuous Infusions: . 0.9 % NaCl with KCl 20 mEq / L 100 mL/hr at 08/06/15 2220    Active Problems:   Hypertension   Acute appendicitis   Acute appendicitis with rupture   Time spent 25 minutes   By signing my name below, I, Rennis Harding attest that this documentation has been prepared under the direction and in the presence of Murray Hodgkins, MD Electronically signed: Rennis Harding  08/07/2015  I have reviewed the above documentation for accuracy and completeness, and I agree with the above. Murray Hodgkins, MD

## 2015-08-07 NOTE — Sedation Documentation (Signed)
Pt complains of some abd pain at site

## 2015-08-07 NOTE — Procedures (Signed)
Interventional Radiology Procedure Note  Procedure: CT guided peri-appendiceal abscess/fluid drainage. 5F drain. Findings: 60 cc amber, thin fluid aspirated.   Sample to cytology.  Sample to lab for Cx.  Complications: None Recommendations:  - Ok to shower tomorrow. - record drain output daily - 10cc NS flushes BID - Do not submerge - Routine care   Signed,  Dulcy Fanny. Earleen Newport, DO

## 2015-08-07 NOTE — Sedation Documentation (Signed)
Received report from [place].

## 2015-08-07 NOTE — Sedation Documentation (Signed)
Vital signs stable. 

## 2015-08-07 NOTE — Sedation Documentation (Signed)
Patient is resting c/o pain

## 2015-08-07 NOTE — Consult Note (Signed)
Chief Complaint: Patient was seen in consultation today for abdominal abscess at the request of Gosrani,Nimish C  Referring Physician(s): Gosrani,Nimish C  History of Present Illness: Casey [name] is a 54 y.o. female   Pt suffering low abd pain x 4-5 days Presented to ED [facility] Work up reveal intra abdominal abscess CT 9/14: IMPRESSION: Findings as described in the right lower quadrant likely representing acute appendicitis with contained perforation and periappendiceal abscess formation with abscess measuring approximately 3.7 x 4.4 cm. No free air.  Request for abscess drain placement Dr Earleen Newport reviewed imaging and approves procedure Now scheduled for same  Past Medical History  Diagnosis Date  . Grave's disease   . DUB (dysfunctional uterine bleeding)   . Palpitations   . Hypertension   . Hyperlipidemia   . Depression   . GERD (gastroesophageal reflux disease)   . Migraines     Past Surgical History  Procedure Laterality Date  . Vesicovaginal fistula closure w/ tah      Dr. Margaretha Glassing  2 degree DUB   . Tonsillectomy    . Vaginal hysterectomy      Allergies: Zosyn; Labetalol; and Morphine and related  Medications: Prior to Admission medications   Medication Sig Start Date End Date Taking? Authorizing Provider  acetaminophen (TYLENOL) 500 MG tablet Take 500-1,000 mg by mouth daily as needed for mild pain or moderate pain.    Historical Provider, MD  cetirizine (ZYRTEC) 10 MG tablet Take 1 tablet (10 mg total) by mouth daily. 06/19/14   [name] Hassell Done, FNP  ciprofloxacin (CIPRO) 500 MG tablet Take 1 tablet (500 mg total) by mouth 2 (two) times daily. 08/06/15   [name] Hassell Done, FNP  escitalopram (LEXAPRO) 10 MG tablet TAKE ONE (1) TABLET EACH DAY 06/18/15   [name] Hassell Done, FNP  fenofibrate 160 MG tablet TAKE ONE (1) TABLET EACH DAY 06/18/15   [name] Hassell Done, FNP  levothyroxine (SYNTHROID, LEVOTHROID) 75 MCG tablet Take 1 tablet (75 mcg  total) by mouth daily. 06/18/15   [name] Hassell Done, FNP  metoprolol succinate (TOPROL-XL) 50 MG 24 hr tablet TAKE ONE (1) TABLET EACH DAY 06/18/15   [name] Hassell Done, FNP  metroNIDAZOLE (FLAGYL) 500 MG tablet Take 1 tablet (500 mg total) by mouth 2 (two) times daily. 08/06/15   [name] Hassell Done, FNP  pantoprazole (PROTONIX) 40 MG tablet Take 1 tablet (40 mg total) by mouth as needed. Patient taking differently: Take 40 mg by mouth daily as needed (for GERD).  06/18/15   [name] Hassell Done, FNP  valsartan-hydrochlorothiazide (DIOVAN-HCT) 160-12.5 MG per tablet Take 1 tablet by mouth daily. 06/18/15   [name] Hassell Done, FNP     Family History  Problem Relation Age of Onset  . Hypertension Mother   . Irregular heart beat Mother   . Thyroid disease Maternal Grandmother   . Cancer Maternal Grandmother     lung  . Cancer Paternal Grandfather     lung    Social History   Social History  . Marital Status: Married    Spouse Name: N/A  . Number of Children: N/A  . Years of Education: N/A   Social History Main Topics  . Smoking status: Never Smoker   . Smokeless tobacco: Never Used  . Alcohol Use: No  . Drug Use: No  . Sexual Activity:    Partners: Male    Birth Control/ Protection: Surgical     Comment: [facility]   Other Topics Concern  . None   Social History Narrative    Review  of Systems: A 12 point ROS discussed and pertinent positives are indicated in the HPI above.  All other systems are negative.  Review of Systems  Constitutional: Positive for activity change and appetite change. Negative for fever.  Respiratory: Negative for shortness of breath.   Gastrointestinal: Positive for nausea, abdominal pain and abdominal distention.  Neurological: Positive for weakness.  Psychiatric/Behavioral: Negative for behavioral problems and confusion.    Vital Signs: BP 133/59 mmHg  Pulse 61  Resp 16  SpO2 99%  LMP 05/23/1995  Physical Exam  Constitutional: She is  oriented to person, place, and time.  Cardiovascular: Normal rate, regular rhythm and normal heart sounds.   No murmur heard. Pulmonary/Chest: Effort normal and breath sounds normal. No respiratory distress. She has no wheezes.  Abdominal: Soft. Bowel sounds are normal. There is tenderness.  Musculoskeletal: Normal range of motion.  Neurological: She is alert and oriented to person, place, and time.  Skin: Skin is warm and dry.  Psychiatric: She has a normal mood and affect. Her behavior is normal. Judgment and thought content normal.  Nursing note and vitals reviewed.   Mallampati Score:  MD Evaluation Airway: WNL Heart: WNL Abdomen: WNL Chest/ Lungs: WNL ASA  Classification: 2 Mallampati/Airway Score: Two  Imaging: Dg Abd 1 View  08/06/2015   CLINICAL DATA:  Abdominal pain.  EXAM: ABDOMEN - 1 VIEW  COMPARISON:  02/10/2015.  FINDINGS: Soft tissue structures are unremarkable. No bowel distention. No free air. No prominent stool burden. No acute bony abnormality. Degenerative changes lumbar spine and both hips.  IMPRESSION: No acute abnormality.  Stable exam from 02/10/2015.   Electronically Signed   By: Marcello Moores  Register   On: 08/06/2015 17:00   Ct Abdomen Pelvis W Contrast  08/06/2015   ADDENDUM REPORT: 08/06/2015 21:43 ADDENDUM: Critical Value/emergent results were called by telephone at the time of interpretation on 08/06/2015 at 7:45 pm to Dr. Vonna Kotyk Dettinger, who verbally acknowledged these results. Electronically Signed   By: Marin Olp M.D.   On: 08/06/2015 21:43  08/06/2015   CLINICAL DATA:  Generalized abdominal pain with nausea 1 week. Right lower quadrant pain 1 day with constipation. Low-grade fever.  EXAM: CT ABDOMEN AND PELVIS WITH CONTRAST  TECHNIQUE: Multidetector CT imaging of the abdomen and pelvis was performed using the standard protocol following bolus administration of intravenous contrast.  CONTRAST:  135mL OMNIPAQUE IOHEXOL 300 MG/ML  SOLN  COMPARISON:  None.   FINDINGS: Lung bases demonstrate a few small pulmonary nodules with the largest measuring 5 mm over the right middle lobe.  Abdominal images demonstrate a normal liver, spleen, pancreas, gallbladder and adrenal glands. Kidneys are normal in size without evidence of nephrolithiasis. There is 2 sub cm left renal cortical hypodensities too small to characterize but likely a cysts. There is minimal prominence of the right intrarenal collecting system and ureter. Ureters are otherwise within normal without evidence of stones.  Within the right lower quadrant there is a rim enhancing fluid collection abutting the cecal tip measuring approximately 3.7 x 4.4 cm likely sequelae from acute appendicitis with contained rupture and periappendiceal abscess. There is secondary wall thickening of an immediately adjacent small bowel loop. Minimal wall thickening of the adjacent terminal ileum. No evidence of free air. There is mild adjacent inflammatory change and minimal free fluid in the right pericolic gutter. Mild to moderate free fluid over the pelvis. Multiple small adjacent mesenteric lymph nodes in the right lower quadrant.  Remainder of the colon and small bowel are  within normal.  Vascular structures are within normal.  Remaining pelvic images demonstrate the bladder and rectum to be within normal. Ovaries are unremarkable.  There are minimal degenerate changes of the spine and hips.  IMPRESSION: Findings as described in the right lower quadrant likely representing acute appendicitis with contained perforation and periappendiceal abscess formation with abscess measuring approximately 3.7 x 4.4 cm. No free air.  Two small sub cm left renal cortical hypodensities too small to characterize, but likely cysts.  A few small bilateral pulmonary nodules with the largest measuring 5 mm over the right middle lobe. Recommend followup chest CT 6 months. This recommendation follows the consensus statement: Guidelines for Management of  Small Pulmonary Nodules Detected on CT Scans: A Statement from the [place] as published in Radiology 2005; 237:395-400. Online at: https://www.arnold.com/.  Minimal prominence of the right intrarenal collecting system and ureter likely secondary to the right lower infectious process.  PRA in process of contacting patient's physician.  Electronically Signed: By: Marin Olp M.D. On: 08/06/2015 19:55    Labs:  CBC:  Recent Labs  09/02/14 1557 02/10/15 1625 08/06/15 2030 08/07/15 0513  WBC 9.4 9.7 12.8* 10.1  HGB 12.8 11.7* 12.9 11.4*  HCT 37.5 37.1* 38.8 35.0*  PLT 278  --  290 230    COAGS:  Recent Labs  08/06/15 2030  INR 1.12  APTT 30    BMP:  Recent Labs  09/02/14 1557 06/18/15 1231 08/06/15 2030 08/07/15 0513  NA 142 144 137 143  K 3.7 4.1 3.3* 3.6  CL 99 101 99* 107  CO2 29 26 29 30   GLUCOSE 85 90 99 98  BUN 20 16 22* 18  CALCIUM 9.7 9.7 9.1 8.6*  CREATININE 0.99 1.06* 1.09* 1.04*  GFRNONAA 66 60 57* >60  GFRAA 76 69 >60 >60    LIVER FUNCTION TESTS:  Recent Labs  09/02/14 1557 06/18/15 1231 08/07/15 0513  BILITOT 0.3 <0.2 0.4  AST 20 18 16   ALT 21 16 17   ALKPHOS 36* 43 29*  PROT 6.7 6.4 6.1*  ALBUMIN  --   --  3.3*    TUMOR MARKERS: No results for input(s): AFPTM, CEA, CA199, CHROMGRNA in the last 8760 hours.  Assessment and Plan:  Ruptured appendix Abdominal abscess Scheduled for abscess drain placement Risks and Benefits discussed with the patient including bleeding, infection, damage to adjacent structures, bowel perforation/fistula connection, and sepsis. All of the patient's questions were answered, patient is agreeable to proceed. Consent signed and in chart.   Thank you for this interesting consult.  I greatly enjoyed meeting Casey [name] and look forward to participating in their care.  A copy of this report was sent to the requesting provider on this date.  Signed: Beth Goodlin  A 08/07/2015, 1:29 PM   I spent a total of 40 Minutes    in face to face in clinical consultation, greater than 50% of which was counseling/coordinating care for abd abscess drain

## 2015-08-07 NOTE — Sedation Documentation (Signed)
Patient is resting comfortably. 

## 2015-08-07 NOTE — Consult Note (Signed)
Reason for Consult: Perforated appendicitis Referring Physician: Hospitalist  Casey [name] is an 54 y.o. female.  HPI: Patient is a 54 year old white female who resented with a several-day history of worsening lower abdominal pain. She underwent outpatient CT scan of the abdomen which revealed a contained perforated appendicitis. She states she had a previous episode of this in March of this year.  Past Medical History  Diagnosis Date  . Grave's disease   . DUB (dysfunctional uterine bleeding)   . Palpitations   . Hypertension   . Hyperlipidemia   . Depression   . GERD (gastroesophageal reflux disease)   . Migraines     Past Surgical History  Procedure Laterality Date  . Vesicovaginal fistula closure w/ tah      Dr. Margaretha Glassing  2 degree DUB   . Tonsillectomy    . Vaginal hysterectomy      Family History  Problem Relation Age of Onset  . Hypertension Mother   . Irregular heart beat Mother   . Thyroid disease Maternal Grandmother   . Cancer Maternal Grandmother     lung  . Cancer Paternal Grandfather     lung    Social History:  reports that she has never smoked. She has never used smokeless tobacco. She reports that she does not drink alcohol or use illicit drugs.  Allergies:  Allergies  Allergen Reactions  . Labetalol Itching  . Morphine And Related Itching    Medications: I have reviewed the patient's current medications.  Results for orders placed or performed during the hospital encounter of 08/06/15 (from the past 48 hour(s))  Protime-INR     Status: None   Collection Time: 08/06/15  8:30 PM  Result Value Ref Range   Prothrombin Time 14.6 11.6 - 15.2 seconds   INR 1.12 0.00 - 1.49  APTT     Status: None   Collection Time: 08/06/15  8:30 PM  Result Value Ref Range   aPTT 30 24 - 37 seconds  CBC     Status: Abnormal   Collection Time: 08/06/15  8:30 PM  Result Value Ref Range   WBC 12.8 (H) 4.0 - 10.5 K/uL   RBC 4.39 3.87 - 5.11 MIL/uL   Hemoglobin  12.9 12.0 - 15.0 g/dL   HCT 38.8 36.0 - 46.0 %   MCV 88.4 78.0 - 100.0 fL   MCH 29.4 26.0 - 34.0 pg   MCHC 33.2 30.0 - 36.0 g/dL   RDW 12.7 11.5 - 15.5 %   Platelets 290 150 - 400 K/uL  Basic metabolic panel     Status: Abnormal   Collection Time: 08/06/15  8:30 PM  Result Value Ref Range   Sodium 137 135 - 145 mmol/L   Potassium 3.3 (L) 3.5 - 5.1 mmol/L   Chloride 99 (L) 101 - 111 mmol/L   CO2 29 22 - 32 mmol/L   Glucose, Bld 99 65 - 99 mg/dL   BUN 22 (H) 6 - 20 mg/dL   Creatinine, Ser 1.09 (H) 0.44 - 1.00 mg/dL   Calcium 9.1 8.9 - 10.3 mg/dL   GFR calc non Af Amer 57 (L) >60 mL/min   GFR calc Af Amer >60 >60 mL/min    Comment: (NOTE) The eGFR has been calculated using the CKD EPI equation. This calculation has not been validated in all clinical situations. eGFR's persistently <60 mL/min signify possible Chronic Kidney Disease.    Anion gap 9 5 - 15  Type and screen  Status: None   Collection Time: 08/06/15  8:30 PM  Result Value Ref Range   ABO/RH(D) O POS    Antibody Screen NEG    Sample Expiration 08/09/2015   Comprehensive metabolic panel     Status: Abnormal   Collection Time: 08/07/15  5:13 AM  Result Value Ref Range   Sodium 143 135 - 145 mmol/L   Potassium 3.6 3.5 - 5.1 mmol/L   Chloride 107 101 - 111 mmol/L   CO2 30 22 - 32 mmol/L   Glucose, Bld 98 65 - 99 mg/dL   BUN 18 6 - 20 mg/dL   Creatinine, Ser 1.04 (H) 0.44 - 1.00 mg/dL   Calcium 8.6 (L) 8.9 - 10.3 mg/dL   Total Protein 6.1 (L) 6.5 - 8.1 g/dL   Albumin 3.3 (L) 3.5 - 5.0 g/dL   AST 16 15 - 41 U/L   ALT 17 14 - 54 U/L   Alkaline Phosphatase 29 (L) 38 - 126 U/L   Total Bilirubin 0.4 0.3 - 1.2 mg/dL   GFR calc non Af Amer >60 >60 mL/min   GFR calc Af Amer >60 >60 mL/min    Comment: (NOTE) The eGFR has been calculated using the CKD EPI equation. This calculation has not been validated in all clinical situations. eGFR's persistently <60 mL/min signify possible Chronic Kidney Disease.    Anion  gap 6 5 - 15  CBC     Status: Abnormal   Collection Time: 08/07/15  5:13 AM  Result Value Ref Range   WBC 10.1 4.0 - 10.5 K/uL   RBC 3.95 3.87 - 5.11 MIL/uL   Hemoglobin 11.4 (L) 12.0 - 15.0 g/dL   HCT 35.0 (L) 36.0 - 46.0 %   MCV 88.6 78.0 - 100.0 fL   MCH 28.9 26.0 - 34.0 pg   MCHC 32.6 30.0 - 36.0 g/dL   RDW 12.8 11.5 - 15.5 %   Platelets 230 150 - 400 K/uL    Dg Abd 1 View  08/06/2015   CLINICAL DATA:  Abdominal pain.  EXAM: ABDOMEN - 1 VIEW  COMPARISON:  02/10/2015.  FINDINGS: Soft tissue structures are unremarkable. No bowel distention. No free air. No prominent stool burden. No acute bony abnormality. Degenerative changes lumbar spine and both hips.  IMPRESSION: No acute abnormality.  Stable exam from 02/10/2015.   Electronically Signed   By: Marcello Moores  Register   On: 08/06/2015 17:00   Ct Abdomen Pelvis W Contrast  08/06/2015   ADDENDUM REPORT: 08/06/2015 21:43 ADDENDUM: Critical Value/emergent results were called by telephone at the time of interpretation on 08/06/2015 at 7:45 pm to Dr. Vonna Kotyk Dettinger, who verbally acknowledged these results. Electronically Signed   By: Marin Olp M.D.   On: 08/06/2015 21:43  08/06/2015   CLINICAL DATA:  Generalized abdominal pain with nausea 1 week. Right lower quadrant pain 1 day with constipation. Low-grade fever.  EXAM: CT ABDOMEN AND PELVIS WITH CONTRAST  TECHNIQUE: Multidetector CT imaging of the abdomen and pelvis was performed using the standard protocol following bolus administration of intravenous contrast.  CONTRAST:  146m OMNIPAQUE IOHEXOL 300 MG/ML  SOLN  COMPARISON:  None.  FINDINGS: Lung bases demonstrate a few small pulmonary nodules with the largest measuring 5 mm over the right middle lobe.  Abdominal images demonstrate a normal liver, spleen, pancreas, gallbladder and adrenal glands. Kidneys are normal in size without evidence of nephrolithiasis. There is 2 sub cm left renal cortical hypodensities too small to characterize but likely  a cysts.  There is minimal prominence of the right intrarenal collecting system and ureter. Ureters are otherwise within normal without evidence of stones.  Within the right lower quadrant there is a rim enhancing fluid collection abutting the cecal tip measuring approximately 3.7 x 4.4 cm likely sequelae from acute appendicitis with contained rupture and periappendiceal abscess. There is secondary wall thickening of an immediately adjacent small bowel loop. Minimal wall thickening of the adjacent terminal ileum. No evidence of free air. There is mild adjacent inflammatory change and minimal free fluid in the right pericolic gutter. Mild to moderate free fluid over the pelvis. Multiple small adjacent mesenteric lymph nodes in the right lower quadrant.  Remainder of the colon and small bowel are within normal.  Vascular structures are within normal.  Remaining pelvic images demonstrate the bladder and rectum to be within normal. Ovaries are unremarkable.  There are minimal degenerate changes of the spine and hips.  IMPRESSION: Findings as described in the right lower quadrant likely representing acute appendicitis with contained perforation and periappendiceal abscess formation with abscess measuring approximately 3.7 x 4.4 cm. No free air.  Two small sub cm left renal cortical hypodensities too small to characterize, but likely cysts.  A few small bilateral pulmonary nodules with the largest measuring 5 mm over the right middle lobe. Recommend followup chest CT 6 months. This recommendation follows the consensus statement: Guidelines for Management of Small Pulmonary Nodules Detected on CT Scans: A Statement from the [place]  as published in Radiology 2005; 237:395-400. Online at: https://www.arnold.com/.  Minimal prominence of the right intrarenal collecting system and ureter likely secondary to the right lower infectious process.  PRA in process of contacting patient's  physician.  Electronically Signed: By: Marin Olp M.D. On: 08/06/2015 19:55    ROS: See chart Blood pressure 116/64, pulse 77, temperature 98.2 F (36.8 C), temperature source Oral, resp. rate 18, height _0  (1.778 m), weight 104.872 kg (231 lb 3.2 oz), last menstrual period 05/23/1995, SpO2 100 %. Physical Exam: Pleasant white female in no acute distress. Abdomen soft with some fullness in the right lower quadrant. No rigidity is noted. No peritoneal signs are noted.  Assessment/Plan: Impression: Contained perforated appendicitis Plan: No need for acute surgical intervention. Order for interventional radiologic drainage of the abscess has been written. Further management is pending those results. Management and treatment of perforated appendicitis was explained to the patient and family, who agree with the treatment plan.  Nazareth Norenberg A 08/07/2015, 8:08 AM

## 2015-08-07 NOTE — Progress Notes (Signed)
Patient returned from [facility] IR. Complaining of severe pain in right lower quadrant radiating to groin. Discussed with Dr. Arnoldo Morale and Dr. Sarajane Jews.  Oxycodone given by mouth. Will continue to monitor.

## 2015-08-08 DIAGNOSIS — K353 Acute appendicitis with localized peritonitis: Principal | ICD-10-CM

## 2015-08-08 DIAGNOSIS — I10 Essential (primary) hypertension: Secondary | ICD-10-CM

## 2015-08-08 DIAGNOSIS — K3533 Acute appendicitis with perforation and localized peritonitis, with abscess: Secondary | ICD-10-CM

## 2015-08-08 MED ORDER — HYDROMORPHONE HCL 1 MG/ML IJ SOLN
0.5000 mg | INTRAMUSCULAR | Status: DC | PRN
  Administered 2015-08-08 – 2015-08-09 (×5): 0.5 mg via INTRAVENOUS
  Filled 2015-08-08 (×5): qty 1

## 2015-08-08 MED ORDER — CYCLOBENZAPRINE HCL 10 MG PO TABS
5.0000 mg | ORAL_TABLET | Freq: Three times a day (TID) | ORAL | Status: DC | PRN
  Filled 2015-08-08: qty 1

## 2015-08-08 NOTE — Care Management Note (Signed)
Case Management Note  Patient Details  Name: Casey [name] MRN: [id number] Date of Birth: [birth year]  Subjective/Objective:                  Pt admitted from home with ruptured appendix with abcess. Pt lives with her husband and daughter. Pt is independent with ADl's.Pt will discharge with JP drain in place.  Action/Plan: Pt denies any need for [facility] services at this time. Pt stated that her daughter and pt can care for the drain. Bedside RN to educate pt and pts daughter on drain care. Anticipate discharge within 24 hours.  Expected Discharge Date:                  Expected Discharge Plan:  Home/Self Care  In-House Referral:  NA  Discharge planning Services  CM Consult  Post Acute Care Choice:  NA Choice offered to:  NA  DME Arranged:    DME Agency:     HH Arranged:    HH Agency:     Status of Service:  Completed, signed off  Medicare Important Message Given:    Date Medicare IM Given:    Medicare IM give by:    Date Additional Medicare IM Given:    Additional Medicare Important Message give by:     If discussed at [place] of Stay Meetings, dates discussed:    Additional Comments:  Joylene Draft, RN 08/08/2015, 10:59 AM

## 2015-08-08 NOTE — Progress Notes (Signed)
Nutrition Brief Note  Patient identified on the Malnutrition Screening Tool (MST) Report  Wt Readings from Last 15 Encounters:  08/06/15 231 lb 3.2 oz (104.872 kg)  08/06/15 231 lb 6.4 oz (104.962 kg)  07/04/15 228 lb (103.42 kg)  06/18/15 228 lb (103.42 kg)  02/10/15 225 lb (102.059 kg)  09/02/14 221 lb 6.4 oz (100.426 kg)  06/27/14 228 lb (103.42 kg)  06/19/14 228 lb 12.8 oz (103.783 kg)  03/18/14 249 lb (112.946 kg)  01/21/14 245 lb (111.131 kg)  08/20/13 241 lb 9.6 oz (109.589 kg)  06/26/13 237 lb (107.502 kg)  03/28/13 233 lb (105.688 kg)   This is a very pleasant 54 year old lady who gives a two-week history of central abdominal pain and more localizing in the right lower quadrant today. It has been associated with nausea but no significant vomiting. She has been feeling hot and cold as if she had a fever. There is no diarrhea. There is no rectal bleeding or hematemesis. She went to see her primary care physician's office and was referred to the emergency room. Evaluation in the emergency room with CT scan showed a ruptured appendicitis with abscess formation. She is now being admitted for further management. She is hypertensive.  Pt s/p fluoroscopic drainage of appendiceal abscess on 08/07/15. Anticipate d/c home tomorrow on 08/09/15.   Body mass index is 33.17 kg/(m^2). Patient meets criteria for obesity, class I based on current BMI.   Current diet order is regular, patient is consuming approximately 50-100% of meals at this time. Labs and medications reviewed.   No nutrition interventions warranted at this time. If nutrition issues arise, please consult RD.   Jenifer A. Jimmye Norman, RD, LDN, CDE Pager: [phone] After hours Pager: [phone]

## 2015-08-08 NOTE — Progress Notes (Signed)
Subjective: Pain and after site seems to be easing.  Objective: Vital signs in last 24 hours: Temp:  [98 F (36.7 C)-98.6 F (37 C)] 98 F (36.7 C) (09/16 0700) Pulse Rate:  [59-67] 64 (09/16 0700) Resp:  [12-17] 16 (09/16 0700) BP: (105-133)/(48-62) 130/57 mmHg (09/16 0700) SpO2:  [92 %-100 %] 99 % (09/16 0700) Last BM Date: 08/06/15  Intake/Output from previous day: 09/15 0701 - 09/16 0700 In: 1330 [P.O.:480; I.V.:800; IV Piggyback:50] Out: 800 [Urine:700; Drains:100] Intake/Output this shift:    General appearance: alert, cooperative and no distress GI: Soft. Catheter in right lower quadrant with clear yellow fluid emanating from it.  Lab Results:   Recent Labs  08/06/15 2030 08/07/15 0513  WBC 12.8* 10.1  HGB 12.9 11.4*  HCT 38.8 35.0*  PLT 290 230   BMET  Recent Labs  08/06/15 2030 08/07/15 0513  NA 137 143  K 3.3* 3.6  CL 99* 107  CO2 29 30  GLUCOSE 99 98  BUN 22* 18  CREATININE 1.09* 1.04*  CALCIUM 9.1 8.6*   PT/INR  Recent Labs  08/06/15 2030  LABPROT 14.6  INR 1.12    Studies/Results: Dg Abd 1 View  08/06/2015   CLINICAL DATA:  Abdominal pain.  EXAM: ABDOMEN - 1 VIEW  COMPARISON:  02/10/2015.  FINDINGS: Soft tissue structures are unremarkable. No bowel distention. No free air. No prominent stool burden. No acute bony abnormality. Degenerative changes lumbar spine and both hips.  IMPRESSION: No acute abnormality.  Stable exam from 02/10/2015.   Electronically Signed   By: Marcello Moores  Register   On: 08/06/2015 17:00   Ct Abdomen Pelvis W Contrast  08/06/2015   ADDENDUM REPORT: 08/06/2015 21:43 ADDENDUM: Critical Value/emergent results were called by telephone at the time of interpretation on 08/06/2015 at 7:45 pm to Dr. Vonna Kotyk Dettinger, who verbally acknowledged these results. Electronically Signed   By: Marin Olp M.D.   On: 08/06/2015 21:43  08/06/2015   CLINICAL DATA:  Generalized abdominal pain with nausea 1 week. Right lower quadrant  pain 1 day with constipation. Low-grade fever.  EXAM: CT ABDOMEN AND PELVIS WITH CONTRAST  TECHNIQUE: Multidetector CT imaging of the abdomen and pelvis was performed using the standard protocol following bolus administration of intravenous contrast.  CONTRAST:  158mL OMNIPAQUE IOHEXOL 300 MG/ML  SOLN  COMPARISON:  None.  FINDINGS: Lung bases demonstrate a few small pulmonary nodules with the largest measuring 5 mm over the right middle lobe.  Abdominal images demonstrate a normal liver, spleen, pancreas, gallbladder and adrenal glands. Kidneys are normal in size without evidence of nephrolithiasis. There is 2 sub cm left renal cortical hypodensities too small to characterize but likely a cysts. There is minimal prominence of the right intrarenal collecting system and ureter. Ureters are otherwise within normal without evidence of stones.  Within the right lower quadrant there is a rim enhancing fluid collection abutting the cecal tip measuring approximately 3.7 x 4.4 cm likely sequelae from acute appendicitis with contained rupture and periappendiceal abscess. There is secondary wall thickening of an immediately adjacent small bowel loop. Minimal wall thickening of the adjacent terminal ileum. No evidence of free air. There is mild adjacent inflammatory change and minimal free fluid in the right pericolic gutter. Mild to moderate free fluid over the pelvis. Multiple small adjacent mesenteric lymph nodes in the right lower quadrant.  Remainder of the colon and small bowel are within normal.  Vascular structures are within normal.  Remaining pelvic images demonstrate the bladder  and rectum to be within normal. Ovaries are unremarkable.  There are minimal degenerate changes of the spine and hips.  IMPRESSION: Findings as described in the right lower quadrant likely representing acute appendicitis with contained perforation and periappendiceal abscess formation with abscess measuring approximately 3.7 x 4.4 cm. No free  air.  Two small sub cm left renal cortical hypodensities too small to characterize, but likely cysts.  A few small bilateral pulmonary nodules with the largest measuring 5 mm over the right middle lobe. Recommend followup chest CT 6 months. This recommendation follows the consensus statement: Guidelines for Management of Small Pulmonary Nodules Detected on CT Scans: A Statement from the [employer] as published in Radiology 2005; 237:395-400. Online at: https://www.arnold.com/.  Minimal prominence of the right intrarenal collecting system and ureter likely secondary to the right lower infectious process.  PRA in process of contacting patient's physician.  Electronically Signed: By: Marin Olp M.D. On: 08/06/2015 19:55   Ct Image Guided Fluid Drain By Catheter  08/07/2015   CLINICAL DATA:  54 year old female with current admission for ruptured appendicitis with associated abscess. She has been referred for drainage.  EXAM: CT GUIDED PERIAPPENDICEAL ABSCESS DRAINAGE  ANESTHESIA/SEDATION: 2.0  Mg IV Versed; 150 mcg IV Fentanyl  Total Moderate Sedation Time: 15 minutes.  PROCEDURE: The procedure risks, benefits, and alternatives were explained to the patient and the patient's family. Questions regarding the procedure were encouraged and answered. The patient understands and consents to the procedure.  Patient was positioned in the supine position on the CT table and a scout CT of the pelvis was acquired.  The right lower quadrant was prepped with Betadinein a sterile fashion, and a sterile drape was applied covering the operative field. A sterile gown and sterile gloves were used for the procedure. Local anesthesia was provided with 1% Lidocaine.  Once the patient is prepped and draped sterilely, the skin and subcutaneous tissues overlying the right iliac bone were generously infiltrated 1% lidocaine for local anesthesia. Using CT guidance, a trocar needle was advanced into  the fluid collection adjacent to the cecum. Aspiration of fluid confirmed location as well as CT imaging.  Using modified Seldinger technique, a 10 French drain was placed over the wire. Aspiration of approximately 70 cc of amber thin fluid. Sample was sent for cytology and for culture.  The drain was locked in position and a suture was placed.  Patient tolerated the procedure well and remained hemodynamically stable throughout.  No complications were encountered and no significant blood loss was encountered.  COMPLICATIONS: None  FINDINGS: Scout CT demonstrates fluid collection lateral to the cecum in the region of the presumed appendiceal rupture.  Images during the case demonstrate pigtail catheter within the fluid collection, collapsed after aspiration of approximately 70 cc of amber fluid.  IMPRESSION: Status post CT-guided drainage of periappendiceal fluid collection. Sample sent to the lab for culture and for cytology.  Signed,  Dulcy Fanny. Earleen Newport, DO  [facility]  [facility]   Electronically Signed   By: Corrie Mckusick D.O.   On: 08/07/2015 16:43    Anti-infectives: Anti-infectives    Start     Dose/Rate Route Frequency Ordered Stop   08/07/15 0600  piperacillin-tazobactam (ZOSYN) IVPB 3.375 g     3.375 g 12.5 mL/hr over 240 Minutes Intravenous Every 8 hours 08/06/15 2220     08/06/15 2030  piperacillin-tazobactam (ZOSYN) IVPB 3.375 g     3.375 g 100 mL/hr over 30 Minutes Intravenous  Once 08/06/15 2015 08/06/15 2109      Assessment/Plan: s/p fluoroscopic drainage of appendiceal abscess. Abdominal pain seems to be easing. Initial Gram stain is negative for organisms. Final cultures are pending. Leukocytosis has resolved. Plan: Would continue IV and a bionics for 24 more hours. Will continue to monitor drainage. Anticipate discharge in next 24-48 hours.  LOS: 2 days    Kriss Ishler A 08/08/2015

## 2015-08-08 NOTE — Progress Notes (Signed)
PROGRESS NOTE  Casey [name] DDU:[id number] DOB: 05-Oct-1961 DOA: 08/06/2015 PCP: Chevis Pretty, FNP  Summary: 8 yofwho presented with complaints of RLQ  abdominal pain and nausea  Evaluation in the emergency room with CT scan showed a ruptured appendicitis with abscess formation. Admitted for further management and evaluation.   Assessment/Plan: 1. Acute appendicitis with contained perforation. IR drain placed 9/15. Will continue conservative management. Slowly improving.  2. Hypertension. Stable.  3. Few small bilateral pulmonary nodules with the largest measuring 5 mm over the right middle lobe. Recommend follow-up chest CT 6 months.   Slowing improving. Ccontinue pain management and continue abx.  Diet advanced  Discussed with Dr Arnoldo Morale, anticipate discharge home tomorrow on oral antibiotics.   Code Status: Full DVT prophylaxis: SCDs Family Communication:Daughter and pastor bedside. Discussed with patient who understands and has no concerns at this time. Disposition Plan: Anticipate discharge home tomorrow.   Murray Hodgkins, MD  [facility]  Pager [phone] If 7PM-7AM, please contact night-coverage at [email or web address], password [facility] 08/08/2015, 7:25 AM  LOS: 2 days   Consultants:  General surgery- Dr Arnoldo Morale  Radiology-Dr Earleen Newport   Procedures:  CT guided peri-appendiceal abscess/fluid drainage. 55F drain  Antibiotics:  Zosyn 9/15>>>  HPI/Subjective: Feels better. She is in pain but the tenderness in her abdomen as improved. The pain she believes is from the drain. Denies any nausea and vomiting.   Objective: Filed Vitals:   08/07/15 1601 08/07/15 1820 08/07/15 2159 08/08/15 0700  BP: 126/62 112/58 117/55 130/57  Pulse: 59 64 66 64  Temp: 98.3 F (36.8 C) 98.1 F (36.7 C) 98.6 F (37 C) 98 F (36.7 C)  TempSrc: Oral  Oral Oral  Resp: 16  17 16   Height:      Weight:      SpO2: 100% 99% 95% 99%    Intake/Output Summary (Last 24 hours) at  08/08/15 0725 Last data filed at 08/08/15 0340  Gross per 24 hour  Intake   1330 ml  Output    800 ml  Net    530 ml     Filed Weights   08/06/15 2022 08/06/15 2215  Weight: 103.874 kg (229 lb) 104.872 kg (231 lb 3.2 oz)    Exam:  Afebrile, VSS, not hypoxic  General:  Appears comfortable, calm. Cardiovascular: Regular rate and rhythm, no murmur, rub or gallop. No lower extremity edema. Respiratory: Clear to auscultation bilaterally, no wheezes, rales or rhonchi. Normal respiratory effort. Abdomen: soft, tender, normal bowel sounds.  Psychiatric: grossly normal mood and affect, speech fluent and appropriate  New data reviewed:  No new data  Pertinent data since admission:  CT A/P revealed ruptured appendicitis with abscess formation  Pending data:    Scheduled Meds: . escitalopram  10 mg Oral Daily  . irbesartan  150 mg Oral Daily   And  . hydrochlorothiazide  12.5 mg Oral Daily  . Influenza vac split quadrivalent PF  0.5 mL Intramuscular Tomorrow-1000  . levothyroxine  75 mcg Oral QAC breakfast  . loratadine  10 mg Oral Daily  . metoprolol succinate  50 mg Oral Daily  . piperacillin-tazobactam (ZOSYN)  IV  3.375 g Intravenous Q8H   Continuous Infusions:    Principal Problem:   Acute appendicitis with perforation and peritoneal abscess Active Problems:   Hypertension   Time spent 20 minutes   By signing my name below, I, Rennis Harding attest that this documentation has been prepared under the direction and in the presence of Murray Hodgkins, MD Electronically  signed: Rennis Harding  08/08/2015  I have reviewed the above documentation for accuracy and completeness, and I agree with the above. Murray Hodgkins, MD

## 2015-08-09 MED ORDER — ACETAMINOPHEN 500 MG PO TABS: 500.0000 mg | ORAL_TABLET | ORAL | Status: DC | PRN

## 2015-08-09 MED ORDER — OXYCODONE HCL 5 MG PO TABS: 5.0000 mg | ORAL_TABLET | ORAL | Status: DC | PRN

## 2015-08-09 MED ORDER — METRONIDAZOLE 500 MG PO TABS: 500.0000 mg | ORAL_TABLET | Freq: Three times a day (TID) | ORAL | Status: DC

## 2015-08-09 MED ORDER — CIPROFLOXACIN HCL 250 MG PO TABS: 500.0000 mg | ORAL_TABLET | Freq: Two times a day (BID) | ORAL | Status: DC

## 2015-08-09 NOTE — Progress Notes (Signed)
PROGRESS NOTE  Casey [name] ZOX:[id number] DOB: [birth year] DOA: 08/06/2015 PCP: Chevis Pretty, FNP  Summary: 49 yofwho presented with complaints of RLQ  abdominal pain and nausea  Evaluation in the emergency room with CT scan showed a ruptured appendicitis with abscess formation. Admitted for further management and evaluation.   Assessment/Plan: 1. Acute appendicitis with contained perforation. IR drain placed 9/15. Continues to improve 2. Hypertension. Stable.  3. Few small bilateral pulmonary nodules with the largest measuring 5 mm over the right middle lobe. Recommend follow-up chest in CT 6 months.   Overall improving, discussed with Dr. Arnoldo Morale. Home today on PO abx, he will arrange outpatient follow up. Plan education on drain care before discharge   Code Status: Full DVT prophylaxis: SCDs Family Communication: husband at bedside.  No questions at this time.   Disposition Plan: discharge today   Murray Hodgkins, MD  [facility]  Pager [phone] If 7PM-7AM, please contact night-coverage at [email or web address], password [facility] 08/09/2015, 6:44 AM  LOS: 3 days   Consultants:  General surgery- Dr Arnoldo Morale  Radiology-Dr Earleen Newport   Procedures:  CT guided peri-appendiceal abscess/fluid drainage. 35F drain  Antibiotics:  Zosyn 9/15>>>9/17  Cipro 9/17 >> 9/23  Flagyl 9/17 >> 9/23  HPI/Subjective: Some abdominal pain but eating okay. No n/v. Pain is better. Ready to go home.  Objective: Filed Vitals:   08/08/15 0700 08/08/15 1537 08/08/15 2203 08/09/15 0543  BP: 130/57 99/50 116/62 108/66  Pulse: 64 62 60 57  Temp: 98 F (36.7 C) 98.3 F (36.8 C) 98.6 F (37 C) 98.1 F (36.7 C)  TempSrc: Oral Oral Oral Oral  Resp: 16 16 16 16   Height:      Weight:      SpO2: 99% 98% 95% 98%    Intake/Output Summary (Last 24 hours) at 08/09/15 0644 Last data filed at 08/08/15 1900  Gross per 24 hour  Intake    360 ml  Output    675 ml  Net   -315 ml     Filed  Weights   08/06/15 2022 08/06/15 2215  Weight: 103.874 kg (229 lb) 104.872 kg (231 lb 3.2 oz)    Exam:  Afebrile, VSS, not hypoxic  General:  Appears calm and comfortable, lying in bed  Cardiovascular: RRR, no m/r/g. No LE edema. Respiratory: CTA bilaterally, no w/r/r. Normal respiratory effort. Abdomen: soft, mild tenderness, nd, positive bowel sounds  Musculoskeletal: grossly normal tone BUE/BLE Psychiatric: grossly normal mood and affect, speech fluent and appropriate  New data reviewed:  No new data  Pertinent data since admission:  CT A/P revealed ruptured appendicitis with abscess formation  Pending data:     Scheduled Meds: . escitalopram  10 mg Oral Daily  . irbesartan  150 mg Oral Daily   And  . hydrochlorothiazide  12.5 mg Oral Daily  . levothyroxine  75 mcg Oral QAC breakfast  . loratadine  10 mg Oral Daily  . metoprolol succinate  50 mg Oral Daily  . piperacillin-tazobactam (ZOSYN)  IV  3.375 g Intravenous Q8H   Continuous Infusions:    Principal Problem:   Acute appendicitis with perforation and peritoneal abscess Active Problems:   Hypertension   By signing my name below, I, Arielle Khosrowpour, attest that this documentation has been prepared under the direction and in the presence of Daniel P. Sarajane Jews, MD. Electronically signed: Salvadore Oxford.  08/09/2015  I have reviewed the above documentation for accuracy and completeness, and I agree with the above. Murray Hodgkins, MD

## 2015-08-09 NOTE — Progress Notes (Signed)
Subjective: Abdominal pain has significantly eased. She is tolerating diet well.  Objective: Vital signs in last 24 hours: Temp:  [98.1 F (36.7 C)-98.6 F (37 C)] 98.1 F (36.7 C) (09/17 0543) Pulse Rate:  [57-62] 57 (09/17 0543) Resp:  [16] 16 (09/17 0543) BP: (99-116)/(50-66) 108/66 mmHg (09/17 0543) SpO2:  [95 %-98 %] 98 % (09/17 0543) Last BM Date: 08/09/15  Intake/Output from previous day: 09/16 0701 - 09/17 0700 In: 360 [P.O.:360] Out: 675 [Urine:550; Drains:125] Intake/Output this shift:    General appearance: alert, cooperative and no distress GI: Soft, clear yellow fluid draining in catheter bag. No rigidity is noted.  Lab Results:   Recent Labs  08/06/15 2030 08/07/15 0513  WBC 12.8* 10.1  HGB 12.9 11.4*  HCT 38.8 35.0*  PLT 290 230   BMET  Recent Labs  08/06/15 2030 08/07/15 0513  NA 137 143  K 3.3* 3.6  CL 99* 107  CO2 29 30  GLUCOSE 99 98  BUN 22* 18  CREATININE 1.09* 1.04*  CALCIUM 9.1 8.6*   PT/INR  Recent Labs  08/06/15 2030  LABPROT 14.6  INR 1.12    Studies/Results: Ct Image Guided Fluid Drain By Catheter  08/07/2015   CLINICAL DATA:  54 year old female with current admission for ruptured appendicitis with associated abscess. She has been referred for drainage.  EXAM: CT GUIDED PERIAPPENDICEAL ABSCESS DRAINAGE  ANESTHESIA/SEDATION: 2.0  Mg IV Versed; 150 mcg IV Fentanyl  Total Moderate Sedation Time: 15 minutes.  PROCEDURE: The procedure risks, benefits, and alternatives were explained to the patient and the patient's family. Questions regarding the procedure were encouraged and answered. The patient understands and consents to the procedure.  Patient was positioned in the supine position on the CT table and a scout CT of the pelvis was acquired.  The right lower quadrant was prepped with Betadinein a sterile fashion, and a sterile drape was applied covering the operative field. A sterile gown and sterile gloves were used for the  procedure. Local anesthesia was provided with 1% Lidocaine.  Once the patient is prepped and draped sterilely, the skin and subcutaneous tissues overlying the right iliac bone were generously infiltrated 1% lidocaine for local anesthesia. Using CT guidance, a trocar needle was advanced into the fluid collection adjacent to the cecum. Aspiration of fluid confirmed location as well as CT imaging.  Using modified Seldinger technique, a 10 French drain was placed over the wire. Aspiration of approximately 70 cc of amber thin fluid. Sample was sent for cytology and for culture.  The drain was locked in position and a suture was placed.  Patient tolerated the procedure well and remained hemodynamically stable throughout.  No complications were encountered and no significant blood loss was encountered.  COMPLICATIONS: None  FINDINGS: Scout CT demonstrates fluid collection lateral to the cecum in the region of the presumed appendiceal rupture.  Images during the case demonstrate pigtail catheter within the fluid collection, collapsed after aspiration of approximately 70 cc of amber fluid.  IMPRESSION: Status post CT-guided drainage of periappendiceal fluid collection. Sample sent to the lab for culture and for cytology.  Signed,  Dulcy Fanny. Earleen Newport, DO  [facility]  [facility]   Electronically Signed   By: Corrie Mckusick D.O.   On: 08/07/2015 16:43    Anti-infectives: Anti-infectives    Start     Dose/Rate Route Frequency Ordered Stop   08/07/15 0600  piperacillin-tazobactam (ZOSYN) IVPB 3.375 g     3.375 g 12.5  mL/hr over 240 Minutes Intravenous Every 8 hours 08/06/15 2220     08/06/15 2030  piperacillin-tazobactam (ZOSYN) IVPB 3.375 g     3.375 g 100 mL/hr over 30 Minutes Intravenous  Once 08/06/15 2015 08/06/15 2109      Assessment/Plan: Impression: Perforated appendicitis, improving. Plan: Okay for discharge from surgery standpoint. I will see her in my office  next week for drain removal. Would continue Augmentin for 1 week.  LOS: 3 days    JENKINS,MARK A 08/09/2015

## 2015-08-09 NOTE — Discharge Summary (Signed)
Physician Discharge Summary  Casey [name] CXK:[id number] DOB: [birth year] DOA: 08/06/2015  PCP: Chevis Pretty, FNP  Admit date: 08/06/2015 Discharge date: 08/09/2015  Recommendations for Outpatient Follow-up:  1. Follow up with Dr. Arnoldo Morale as outpatient for resolution of acute appendicitis. 2. Per radiology the following drain care should be conducted: record drain output daily, do not submerge, and 10cc NS flushes daily.  3. Follow-up CT Chest in 6 months for pulmonary nodules.  Follow-up Information    Follow up with Casey So, MD. Schedule an appointment as soon as possible for a visit on 08/12/2015.   Specialty:  General Surgery   Why:  10am    Contact information:   [address] [place] [address] [phone]        Discharge Diagnoses:  1. Acute appendicitis with contained perforation 2. Hypertension 3. Few small bilateral pulmonary nodules  Discharge Condition: Improved  Disposition: Home  Diet recommendation: Heart healthy  Filed Weights   08/06/15 2022 08/06/15 2215  Weight: 103.874 kg (229 lb) 104.872 kg (231 lb 3.2 oz)    History of present illness:  54 yofwho presented with complaints of RLQ abdominal pain and nausea Evaluation in the emergency room with CT scan showed a ruptured appendicitis with abscess formation. Admitted for further management and evaluation.   Hospital Course:  Started on IV antibiotics. Clinical condition was stable. General surgery recommended drain placement. Appendicitis improved after CT guided peri-appendiceal abscess/fluid drainage and with abx. Hospitalization was uncomplicated. Plan home with close follow-up with general surgery.  Individual issues as follow  1. Acute appendicitis with contained perforation. IR drain placed 9/15. Continues to improve 2. Hypertension. Stable.  3. Few small bilateral pulmonary nodules with the largest measuring 5 mm over the right middle lobe. Recommend follow-up  chest CT 6 months.   Consultants:  General surgery- Dr Arnoldo Morale  Radiology-Dr Earleen Newport  Procedures:  CT guided peri-appendiceal abscess/fluid drainage. 65F drain  Antibiotics:  Zosyn 9/15>>>9/17  Cipro 9/17 >> 9/23  Flagyl 9/17 >> 9/23   Discharge Instructions Discharge Instructions    Diet general    Complete by:  As directed      Discharge instructions    Complete by:  As directed   Call your physician or seek immediate medical attention for increased pain, fever, vomiting, drainage or worsening of condition.     Increase activity slowly    Complete by:  As directed             Discharge Medication List as of 08/09/2015 10:15 AM    START taking these medications   Details  oxyCODONE (OXY IR/ROXICODONE) 5 MG immediate release tablet Take 1 tablet (5 mg total) by mouth every 4 (four) hours as needed for severe pain., Starting 08/09/2015, Until Discontinued, Print      CONTINUE these medications which have CHANGED   Details  acetaminophen (TYLENOL) 500 MG tablet Take 1 tablet (500 mg total) by mouth every 4 (four) hours as needed for mild pain or moderate pain., Starting 08/09/2015, Until Discontinued, No Print    metroNIDAZOLE (FLAGYL) 500 MG tablet Take 1 tablet (500 mg total) by mouth 3 (three) times daily., Starting 08/09/2015, Until Discontinued, No Print      CONTINUE these medications which have NOT CHANGED   Details  cetirizine (ZYRTEC) 10 MG tablet Take 1 tablet (10 mg total) by mouth daily., Starting 06/19/2014, Until Discontinued, Normal    escitalopram (LEXAPRO) 10 MG tablet TAKE ONE (1) TABLET EACH DAY, Normal    fenofibrate  160 MG tablet TAKE ONE (1) TABLET EACH DAY, Normal    levothyroxine (SYNTHROID, LEVOTHROID) 75 MCG tablet Take 1 tablet (75 mcg total) by mouth daily., Starting 06/18/2015, Until Discontinued, Normal    metoprolol succinate (TOPROL-XL) 50 MG 24 hr tablet TAKE ONE (1) TABLET EACH DAY, Normal    pantoprazole (PROTONIX) 40 MG tablet  Take 1 tablet (40 mg total) by mouth as needed., Starting 06/18/2015, Until Discontinued, Normal    valsartan-hydrochlorothiazide (DIOVAN-HCT) 160-12.5 MG per tablet Take 1 tablet by mouth daily., Starting 06/18/2015, Until Discontinued, Normal    ciprofloxacin (CIPRO) 500 MG tablet Take 1 tablet (500 mg total) by mouth 2 (two) times daily., Starting 08/06/2015, Until Discontinued, Normal       Allergies  Allergen Reactions  . Zosyn [Piperacillin Sod-Tazobactam [name]] Rash  . Labetalol Itching  . Morphine And Related Itching    The results of significant diagnostics from this hospitalization (including imaging, microbiology, ancillary and laboratory) are listed below for reference.    Significant Diagnostic Studies: Dg Abd 1 View  08/06/2015   CLINICAL DATA:  Abdominal pain.  EXAM: ABDOMEN - 1 VIEW  COMPARISON:  02/10/2015.  FINDINGS: Soft tissue structures are unremarkable. No bowel distention. No free air. No prominent stool burden. No acute bony abnormality. Degenerative changes lumbar spine and both hips.  IMPRESSION: No acute abnormality.  Stable exam from 02/10/2015.   Electronically Signed   By: Marcello Moores  Register   On: 08/06/2015 17:00   Ct Abdomen Pelvis W Contrast  08/06/2015   ADDENDUM REPORT: 08/06/2015 21:43 ADDENDUM: Critical Value/emergent results were called by telephone at the time of interpretation on 08/06/2015 at 7:45 pm to Dr. Vonna Kotyk Dettinger, who verbally acknowledged these results. Electronically Signed   By: Marin Olp M.D.   On: 08/06/2015 21:43  08/06/2015   CLINICAL DATA:  Generalized abdominal pain with nausea 1 week. Right lower quadrant pain 1 day with constipation. Low-grade fever.  EXAM: CT ABDOMEN AND PELVIS WITH CONTRAST  TECHNIQUE: Multidetector CT imaging of the abdomen and pelvis was performed using the standard protocol following bolus administration of intravenous contrast.  CONTRAST:  149mL OMNIPAQUE IOHEXOL 300 MG/ML  SOLN  COMPARISON:  None.  FINDINGS: Lung  bases demonstrate a few small pulmonary nodules with the largest measuring 5 mm over the right middle lobe.  Abdominal images demonstrate a normal liver, spleen, pancreas, gallbladder and adrenal glands. Kidneys are normal in size without evidence of nephrolithiasis. There is 2 sub cm left renal cortical hypodensities too small to characterize but likely a cysts. There is minimal prominence of the right intrarenal collecting system and ureter. Ureters are otherwise within normal without evidence of stones.  Within the right lower quadrant there is a rim enhancing fluid collection abutting the cecal tip measuring approximately 3.7 x 4.4 cm likely sequelae from acute appendicitis with contained rupture and periappendiceal abscess. There is secondary wall thickening of an immediately adjacent small bowel loop. Minimal wall thickening of the adjacent terminal ileum. No evidence of free air. There is mild adjacent inflammatory change and minimal free fluid in the right pericolic gutter. Mild to moderate free fluid over the pelvis. Multiple small adjacent mesenteric lymph nodes in the right lower quadrant.  Remainder of the colon and small bowel are within normal.  Vascular structures are within normal.  Remaining pelvic images demonstrate the bladder and rectum to be within normal. Ovaries are unremarkable.  There are minimal degenerate changes of the spine and hips.  IMPRESSION: Findings as described in  the right lower quadrant likely representing acute appendicitis with contained perforation and periappendiceal abscess formation with abscess measuring approximately 3.7 x 4.4 cm. No free air.  Two small sub cm left renal cortical hypodensities too small to characterize, but likely cysts.  A few small bilateral pulmonary nodules with the largest measuring 5 mm over the right middle lobe. Recommend followup chest CT 6 months. This recommendation follows the consensus statement: Guidelines for Management of Small Pulmonary  Nodules Detected on CT Scans: A Statement from the [place] as published in Radiology 2005; 237:395-400. Online at: https://www.arnold.com/.  Minimal prominence of the right intrarenal collecting system and ureter likely secondary to the right lower infectious process.  PRA in process of contacting patient's physician.  Electronically Signed: By: Marin Olp M.D. On: 08/06/2015 19:55   Ct Image Guided Fluid Drain By Catheter  08/07/2015   CLINICAL DATA:  54 year old female with current admission for ruptured appendicitis with associated abscess. She has been referred for drainage.  EXAM: CT GUIDED PERIAPPENDICEAL ABSCESS DRAINAGE  ANESTHESIA/SEDATION: 2.0  Mg IV Versed; 150 mcg IV Fentanyl  Total Moderate Sedation Time: 15 minutes.  PROCEDURE: The procedure risks, benefits, and alternatives were explained to the patient and the patient's family. Questions regarding the procedure were encouraged and answered. The patient understands and consents to the procedure.  Patient was positioned in the supine position on the CT table and a scout CT of the pelvis was acquired.  The right lower quadrant was prepped with Betadinein a sterile fashion, and a sterile drape was applied covering the operative field. A sterile gown and sterile gloves were used for the procedure. Local anesthesia was provided with 1% Lidocaine.  Once the patient is prepped and draped sterilely, the skin and subcutaneous tissues overlying the right iliac bone were generously infiltrated 1% lidocaine for local anesthesia. Using CT guidance, a trocar needle was advanced into the fluid collection adjacent to the cecum. Aspiration of fluid confirmed location as well as CT imaging.  Using modified Seldinger technique, a 10 French drain was placed over the wire. Aspiration of approximately 70 cc of amber thin fluid. Sample was sent for cytology and for culture.  The drain was locked in position and a suture was  placed.  Patient tolerated the procedure well and remained hemodynamically stable throughout.  No complications were encountered and no significant blood loss was encountered.  COMPLICATIONS: None  FINDINGS: Scout CT demonstrates fluid collection lateral to the cecum in the region of the presumed appendiceal rupture.  Images during the case demonstrate pigtail catheter within the fluid collection, collapsed after aspiration of approximately 70 cc of amber fluid.  IMPRESSION: Status post CT-guided drainage of periappendiceal fluid collection. Sample sent to the lab for culture and for cytology.  Signed,  Dulcy Fanny. Earleen Newport, DO  [facility]  [facility]   Electronically Signed   By: Corrie Mckusick D.O.   On: 08/07/2015 16:43    Microbiology: Recent Results (from the past 240 hour(s))  Culture, routine-abscess     Status: None (Preliminary result)   Collection Time: 08/07/15  3:30 PM  Result Value Ref Range Status   Specimen Description ABSCESS APPENDIX  Final   Special Requests NONE  Final   Gram Stain   Final    RARE WBC PRESENT,BOTH PMN AND MONONUCLEAR NO SQUAMOUS EPITHELIAL CELLS SEEN NO ORGANISMS SEEN Performed at [facility]    Culture   Final    NO GROWTH 1 DAY Performed  at [facility]    Report Status PENDING  Incomplete     Labs: Basic Metabolic Panel:  Recent Labs Lab 08/06/15 2030 08/07/15 0513  NA 137 143  K 3.3* 3.6  CL 99* 107  CO2 29 30  GLUCOSE 99 98  BUN 22* 18  CREATININE 1.09* 1.04*  CALCIUM 9.1 8.6*   Liver Function Tests:  Recent Labs Lab 08/07/15 0513  AST 16  ALT 17  ALKPHOS 29*  BILITOT 0.4  PROT 6.1*  ALBUMIN 3.3*   CBC:  Recent Labs Lab 08/06/15 2030 08/07/15 0513  WBC 12.8* 10.1  HGB 12.9 11.4*  HCT 38.8 35.0*  MCV 88.4 88.6  PLT 290 230     Principal Problem:   Acute appendicitis with perforation and peritoneal abscess Active Problems:   Hypertension   Time  coordinating discharge: 35 minutes   Signed:  Murray Hodgkins, MD [facility] 08/09/2015, 8:40 AM   By signing my name below, I, Salvadore Oxford, attest that this documentation has been prepared under the direction and in the presence of Daniel P. Sarajane Jews, MD. Electronically signed: Salvadore Oxford. 08/09/2015  I have reviewed the above documentation for accuracy and completeness, and I agree with the above. Murray Hodgkins, MD

## 2015-08-09 NOTE — Progress Notes (Signed)
Patient with orders to be discharge home. Discharge instructions given, patient and spouse verbalized understanding. Education given on perc drain care, patient and spouse did return demonstration. Prescriptions given. Patient stable. Patient left in private vehicle with spouse.

## 2015-08-11 LAB — CULTURE, ROUTINE-ABSCESS: CULTURE: NO GROWTH

## 2015-08-20 ENCOUNTER — Other Ambulatory Visit: Payer: Self-pay | Admitting: Nurse Practitioner

## 2015-10-22 ENCOUNTER — Encounter: Payer: Self-pay | Admitting: Family Medicine

## 2015-10-22 ENCOUNTER — Ambulatory Visit (INDEPENDENT_AMBULATORY_CARE_PROVIDER_SITE_OTHER): Payer: Commercial Managed Care - HMO | Admitting: Family Medicine

## 2015-10-22 VITALS — BP 120/75 | HR 65 | Temp 97.2°F | Ht 70.0 in | Wt 237.6 lb

## 2015-10-22 DIAGNOSIS — R1030 Lower abdominal pain, unspecified: Secondary | ICD-10-CM

## 2015-10-22 NOTE — Progress Notes (Signed)
   Subjective:    Patient ID: Casey [name], female    DOB: May 22, 1961, 54 y.o.   MRN: [id number]  HPI 54 year old female with abdominal pain. Review of her history shows that she had acute appendicitis back in September with abscess. The appendix was not removed but the abscess was drained. She has done fairly well since then but recently has developed increasing lower abdominal pain. Last week she had a week of diarrhea. She does have some constipation from time to time. With that history he might wonder about irritable bowel syndrome but also with her history of the previous abscess and drainage concern is that there may have been some pocket of infection somewhere that was not drained and now is recurring.  Patient Active Problem List   Diagnosis Date Noted  . Acute appendicitis with perforation and peritoneal abscess 08/08/2015  . BMI 33.0-33.9,adult 06/18/2015  . GERD (gastroesophageal reflux disease) 03/18/2014  . Depression 03/18/2014  . Hypertension 03/28/2013  . Hyperlipidemia 03/28/2013  . Hypothyroidism 03/28/2013  . Graves' disease   . Palpitations    Outpatient Encounter Prescriptions as of 10/22/2015  Medication Sig  . acetaminophen (TYLENOL) 500 MG tablet Take 1 tablet (500 mg total) by mouth every 4 (four) hours as needed for mild pain or moderate pain.  . cetirizine (ZYRTEC) 10 MG tablet TAKE ONE (1) TABLET EACH DAY  . escitalopram (LEXAPRO) 10 MG tablet TAKE ONE (1) TABLET EACH DAY  . fenofibrate 160 MG tablet TAKE ONE (1) TABLET EACH DAY  . levothyroxine (SYNTHROID, LEVOTHROID) 75 MCG tablet Take 1 tablet (75 mcg total) by mouth daily.  . metoprolol succinate (TOPROL-XL) 50 MG 24 hr tablet TAKE ONE (1) TABLET EACH DAY  . pantoprazole (PROTONIX) 40 MG tablet Take 1 tablet (40 mg total) by mouth as needed. (Patient taking differently: Take 40 mg by mouth daily as needed (for GERD). )  . valsartan-hydrochlorothiazide (DIOVAN-HCT) 160-12.5 MG per tablet Take 1 tablet by  mouth daily.  . [DISCONTINUED] ciprofloxacin (CIPRO) 500 MG tablet Take 1 tablet (500 mg total) by mouth 2 (two) times daily.  . [DISCONTINUED] metroNIDAZOLE (FLAGYL) 500 MG tablet Take 1 tablet (500 mg total) by mouth 3 (three) times daily.  . [DISCONTINUED] oxyCODONE (OXY IR/ROXICODONE) 5 MG immediate release tablet Take 1 tablet (5 mg total) by mouth every 4 (four) hours as needed for severe pain.   No facility-administered encounter medications on file as of 10/22/2015.      Review of Systems  Constitutional: Negative.   Respiratory: Negative.   Cardiovascular: Negative.   Genitourinary: Negative.   Neurological: Negative.        Objective:   Physical Exam  Constitutional: She appears well-developed and well-nourished.  Cardiovascular: Normal rate and regular rhythm.   Pulmonary/Chest: Effort normal and breath sounds normal.  Abdominal: Soft. There is no tenderness. There is no rebound and no guarding.  There is a small palpable lump midway between umbilicus and pubic crest. This may represent scar tissue. But generally her abdomen is benign there is no guarding or rebound or particular tenderness.          Assessment & Plan:  1. Lower abdominal pain Thing given her history we have to rule out nidus of infection or abscess. If this is negative might try her on a trial of medicine for vertebral bowel syndrome. - CT Abdomen Pelvis W Contrast; Future  Wardell Honour MD

## 2015-10-29 ENCOUNTER — Ambulatory Visit (HOSPITAL_COMMUNITY)
Admission: RE | Admit: 2015-10-29 | Discharge: 2015-10-29 | Disposition: A | Discharge status: Home / Self Care | Payer: Commercial Managed Care - HMO | Source: Ambulatory Visit | Attending: Family Medicine | Admitting: Family Medicine

## 2015-10-29 DIAGNOSIS — R1030 Lower abdominal pain, unspecified: Secondary | ICD-10-CM

## 2015-10-29 DIAGNOSIS — N83202 Unspecified ovarian cyst, left side: Secondary | ICD-10-CM | POA: Insufficient documentation

## 2015-10-29 IMAGING — CT CT ABD-PELV W/ CM
2 of 5 series · 16 of 46 positions shown, 18 images · IV contrast (Omnipaque 300)
Comparison: [DATE] and [DATE]

CLINICAL DATA: Right lower quadrant pain and nausea for 3 weeks,
history of abscess drainage for perforated appendicitis

EXAM:
CT ABDOMEN AND PELVIS WITH CONTRAST
TECHNIQUE: Multidetector CT imaging of the abdomen and pelvis was performed
using the standard protocol following bolus administration of
intravenous contrast.
CONTRAST:  100mL OMNIPAQUE IOHEXOL 300 MG/ML  SOLN

[Series 2: abd_pel_with 5.0 b40f · axial · 0.79mm/px · z∈[-526,-100]mm · 13 of 99 slices shown, 15 images]
[im 7/99  soft-tissue]
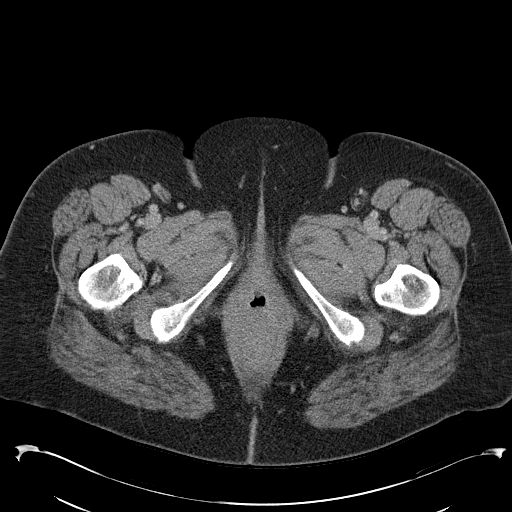
[im 7/99  bone]
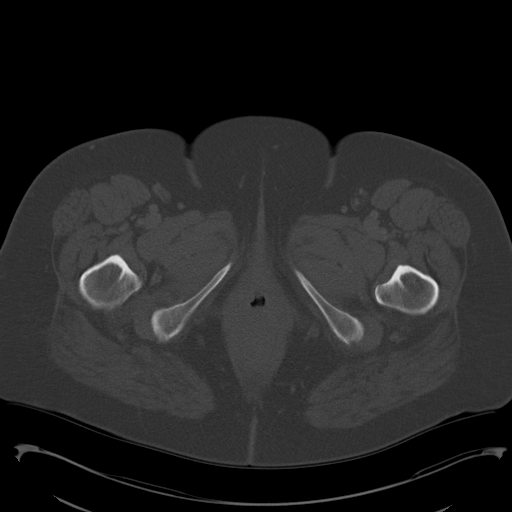
[im 14/99  soft-tissue]
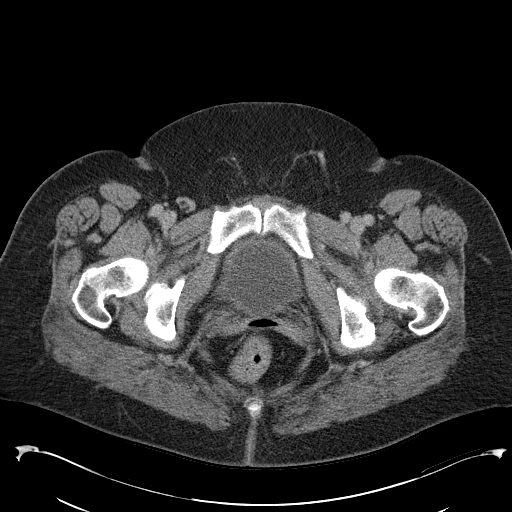
[im 20/99  soft-tissue]
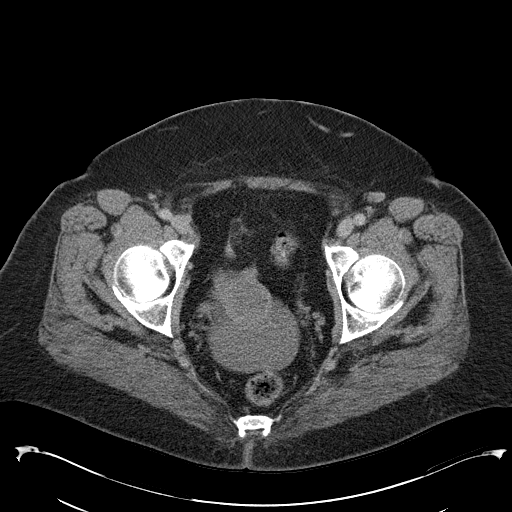
[im 27/99  soft-tissue]
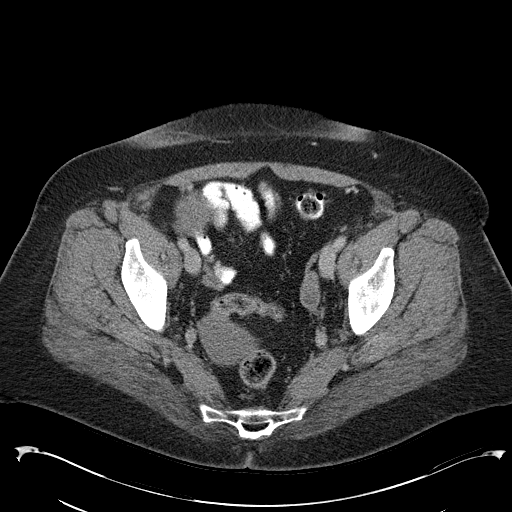
[im 33/99  soft-tissue]
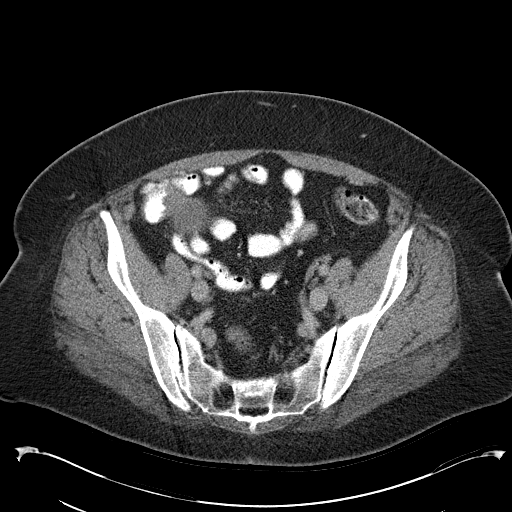
[im 40/99  soft-tissue]
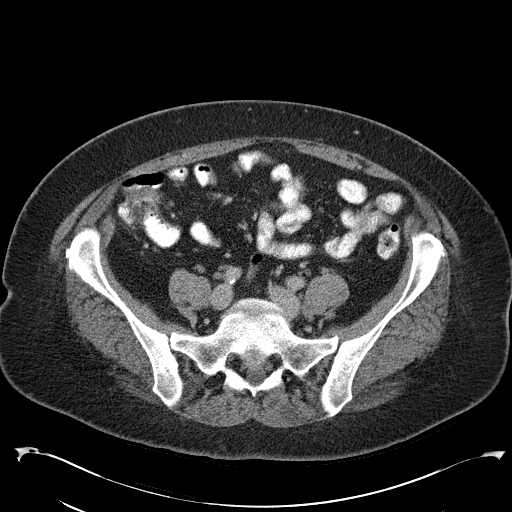
[im 53/99  soft-tissue]
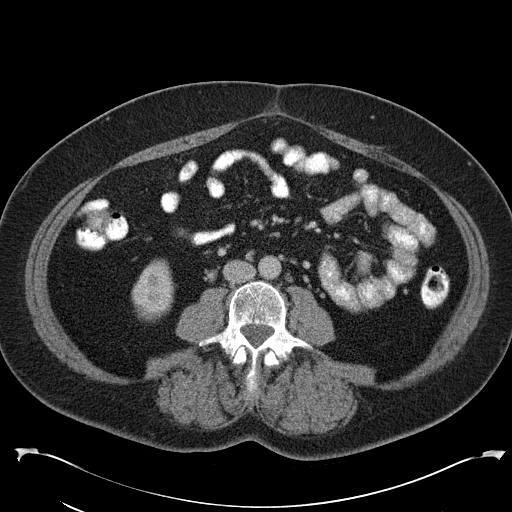
[im 59/99  soft-tissue]
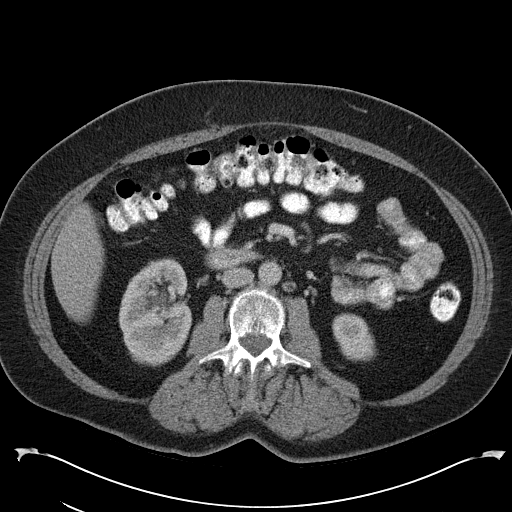
[im 66/99  soft-tissue]
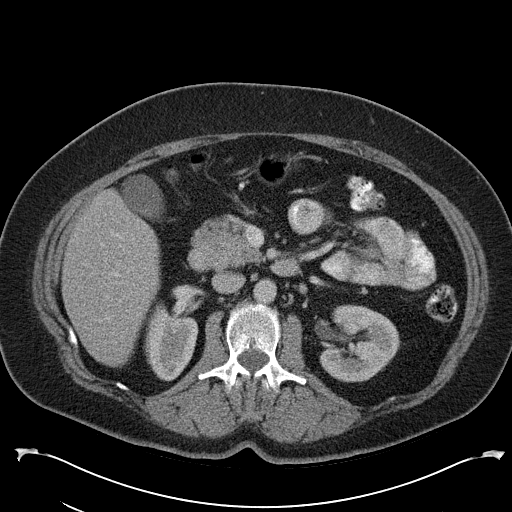
[im 66/99  bone]
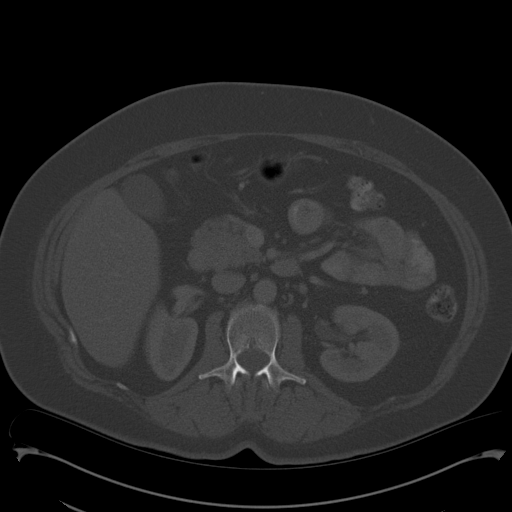
[im 72/99  soft-tissue]
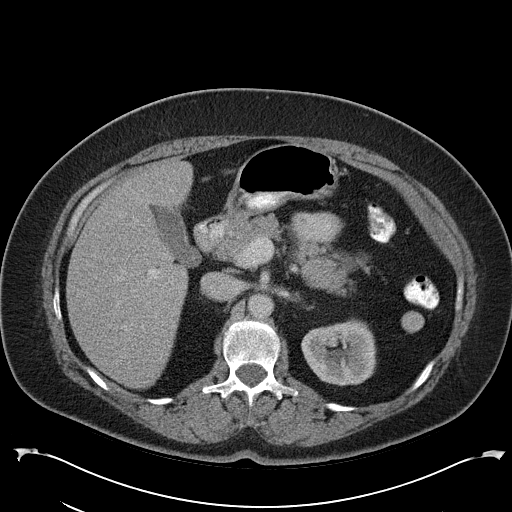
[im 79/99  soft-tissue]
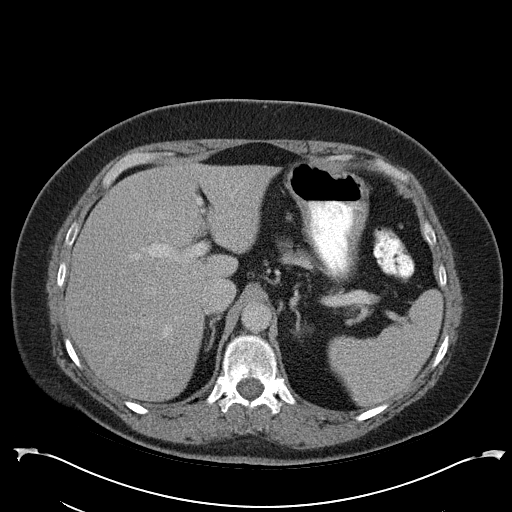
[im 85/99  soft-tissue]
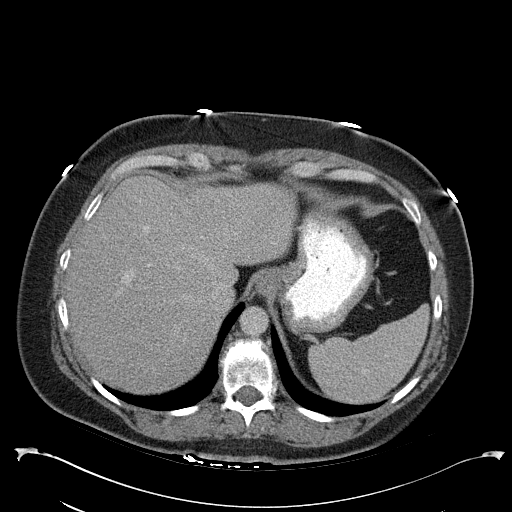
[im 92/99  soft-tissue]
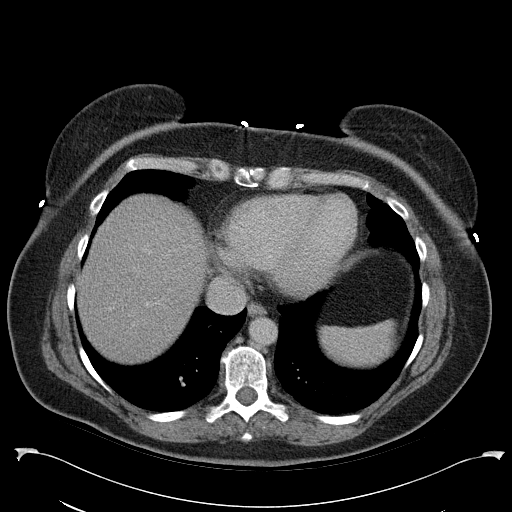

[Series 4: abd_pel_with 3.0 spo · coronal · 0.85mm/px · 3 of 98 slices shown]
[im 33/98  soft-tissue]
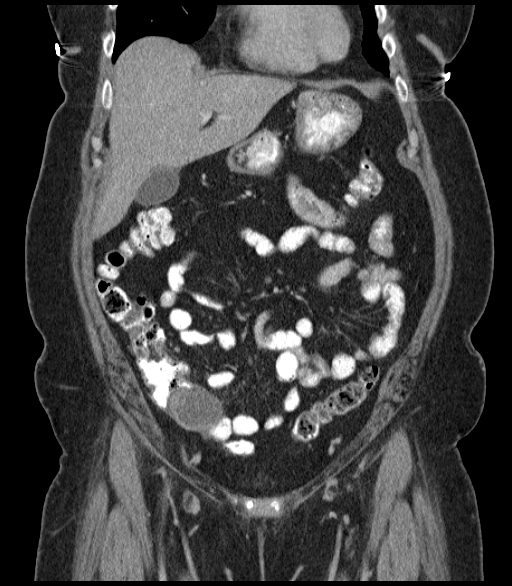
[im 44/98  soft-tissue]
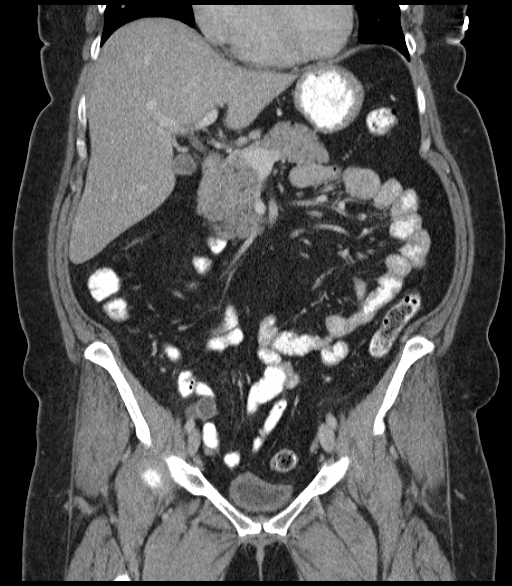
[im 54/98  soft-tissue]
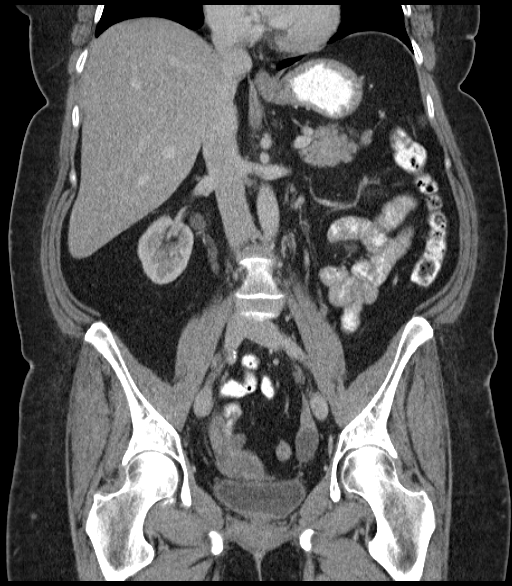

[16 of 46 positions shown; findings below may reference images not displayed]

FINDINGS: Lung bases shows stable 5 mm nodule in right middle lobe.

Sagittal images of the spine shows mild degenerative changes lower
thoracic spine.

Enhanced liver, pancreas, spleen and adrenal glands are
unremarkable. Small accessory splenule. No calcified gallstones are
noted within gallbladder. Enhanced kidneys are symmetrical in size.
No hydronephrosis or hydroureter. Tiny umbilical hernia containing
fat without evidence of acute complication.

Delayed renal images shows bilateral renal symmetrical excretion.
Bilateral visualized proximal ureter is unremarkable.

Again noted small to moderate free fluid within posterior pelvis.
The uterus is unremarkable.

There is a recurrent fluid collection in the region of the appendix
measures 5.6 x 4.6 cm. Measures 23.5 Hounsfield units in
attenuation. This is suspicious for recurrent abscess. The appendix
is not identified. There is contrast opacification of the cecum.
There is tiny amount of contrast at the base of the appendix in
axial image 69. There is no evidence of contrast extravasation.
There is no evidence of contrast material within the collection.

The terminal ileum is unremarkable.

No small bowel obstruction. The uterus is unremarkable. Stable
cystic structure within left ovary measures about 2.7 cm. No distal
colonic obstruction. The urinary bladder is unremarkable. No
inguinal adenopathy.
IMPRESSION: 1. There is a complex collection in right lower quadrant in the
region of the appendix measures 5.6 x 4.6 cm. This is suspicious for
recurrent inflammatory process or abscess. There is no evidence of
contrast extravasation or contrast material entering the collection.
The appendix is not identified. Unremarkable terminal ileum. No
significant pericecal stranding.
2. No small bowel obstruction.
3. Again noted small to moderate pelvic free fluid within posterior
cul-de-sac. Stable cystic structure within left ovary measures
cm.
4. No hydronephrosis or hydroureter.

## 2015-10-29 MED ORDER — IOHEXOL 300 MG/ML  SOLN
100.0000 mL | Freq: Once | INTRAMUSCULAR | Status: AC | PRN
  Administered 2015-10-29: 100 mL via INTRAVENOUS

## 2015-10-30 ENCOUNTER — Other Ambulatory Visit: Payer: Self-pay | Admitting: Family Medicine

## 2015-10-30 ENCOUNTER — Telehealth: Payer: Self-pay | Admitting: Family Medicine

## 2015-10-30 DIAGNOSIS — R9389 Abnormal findings on diagnostic imaging of other specified body structures: Secondary | ICD-10-CM

## 2015-12-16 ENCOUNTER — Other Ambulatory Visit: Payer: Self-pay | Admitting: Nurse Practitioner

## 2015-12-16 DIAGNOSIS — K3533 Acute appendicitis with perforation and localized peritonitis, with abscess: Secondary | ICD-10-CM

## 2015-12-25 ENCOUNTER — Other Ambulatory Visit: Payer: Self-pay | Admitting: General Surgery

## 2015-12-25 DIAGNOSIS — R1031 Right lower quadrant pain: Secondary | ICD-10-CM

## 2015-12-25 DIAGNOSIS — IMO0002 Reserved for concepts with insufficient information to code with codable children: Secondary | ICD-10-CM

## 2015-12-29 ENCOUNTER — Other Ambulatory Visit: Payer: Self-pay | Admitting: Nurse Practitioner

## 2016-01-07 ENCOUNTER — Ambulatory Visit (HOSPITAL_COMMUNITY)
Admission: RE | Admit: 2016-01-07 | Discharge: 2016-01-07 | Disposition: A | Discharge status: Home / Self Care | Payer: Commercial Managed Care - HMO | Source: Ambulatory Visit | Attending: General Surgery | Admitting: General Surgery

## 2016-01-07 ENCOUNTER — Encounter (HOSPITAL_COMMUNITY): Payer: Self-pay

## 2016-01-07 DIAGNOSIS — N83201 Unspecified ovarian cyst, right side: Secondary | ICD-10-CM | POA: Insufficient documentation

## 2016-01-07 DIAGNOSIS — R1031 Right lower quadrant pain: Secondary | ICD-10-CM

## 2016-01-07 DIAGNOSIS — K651 Peritoneal abscess: Secondary | ICD-10-CM | POA: Insufficient documentation

## 2016-01-07 DIAGNOSIS — K760 Fatty (change of) liver, not elsewhere classified: Secondary | ICD-10-CM | POA: Diagnosis not present

## 2016-01-07 DIAGNOSIS — K8020 Calculus of gallbladder without cholecystitis without obstruction: Secondary | ICD-10-CM | POA: Insufficient documentation

## 2016-01-07 DIAGNOSIS — IMO0002 Reserved for concepts with insufficient information to code with codable children: Secondary | ICD-10-CM

## 2016-01-07 DIAGNOSIS — N83202 Unspecified ovarian cyst, left side: Secondary | ICD-10-CM | POA: Insufficient documentation

## 2016-01-07 IMAGING — CT CT ABD-PELV W/ CM
2 of 5 series · 16 of 46 positions shown, 18 images · IV contrast (omnipaque)
Comparison: [DATE]

CLINICAL DATA: Right lower quadrant pain with nausea and vomiting.
History of ruptured appendicitis [REDACTED]

EXAM:
CT ABDOMEN AND PELVIS WITH CONTRAST
TECHNIQUE: Multidetector CT imaging of the abdomen and pelvis was performed
using the standard protocol following bolus administration of
intravenous contrast.
CONTRAST:  100mL OMNIPAQUE IOHEXOL 300 MG/ML  SOLN

[Series 2: a/p w/ 5mm · axial · 0.87mm/px · z∈[-979,-504]mm · 13 of 107 slices shown, 15 images]
[im 6/107  soft-tissue]
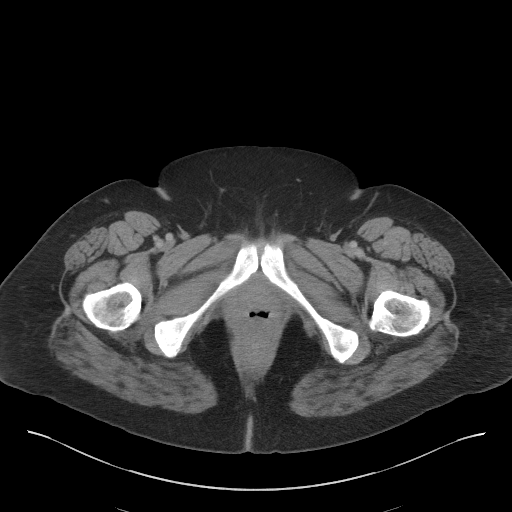
[im 6/107  bone]
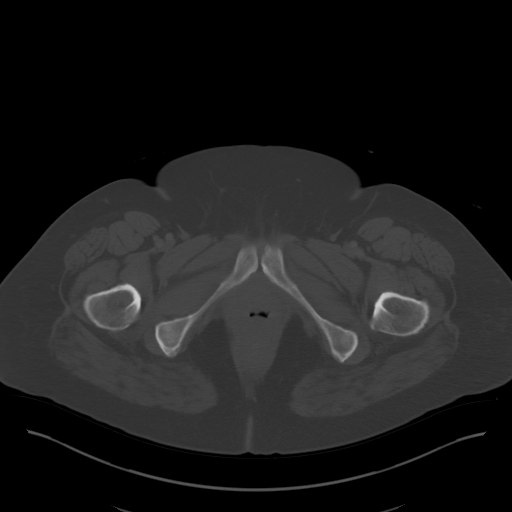
[im 12/107  soft-tissue]
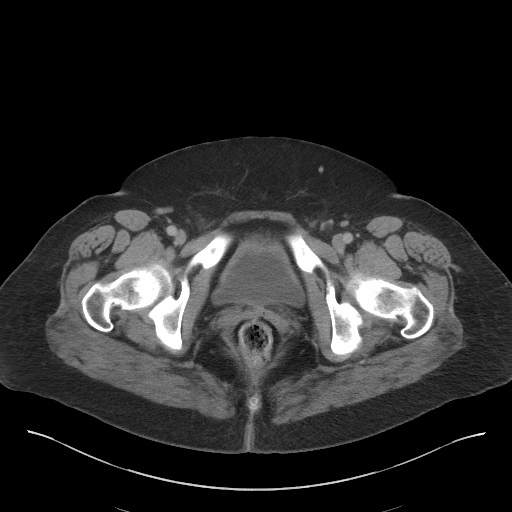
[im 24/107  soft-tissue]
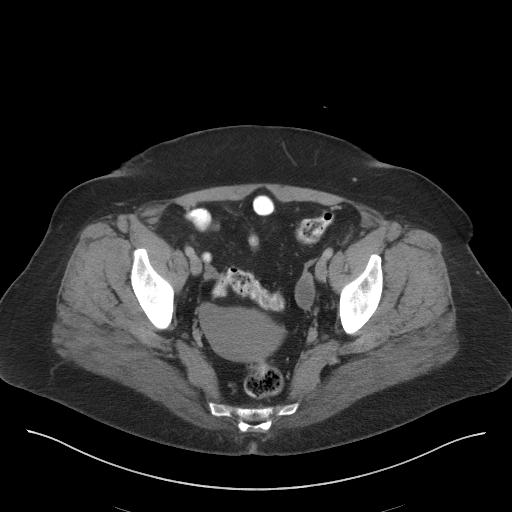
[im 30/107  soft-tissue]
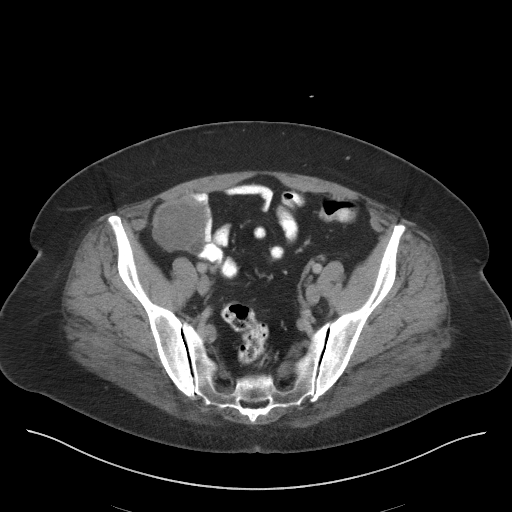
[im 36/107  soft-tissue]
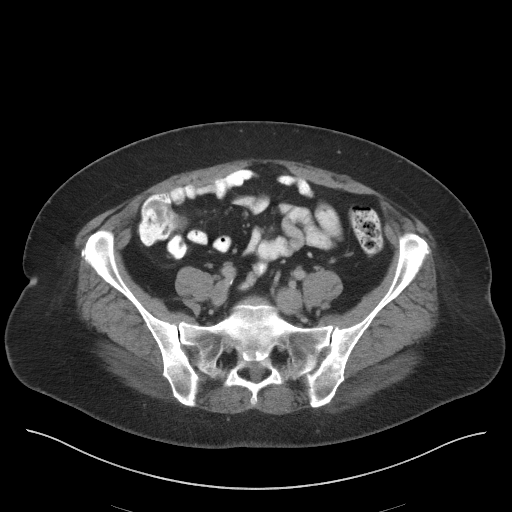
[im 48/107  soft-tissue]
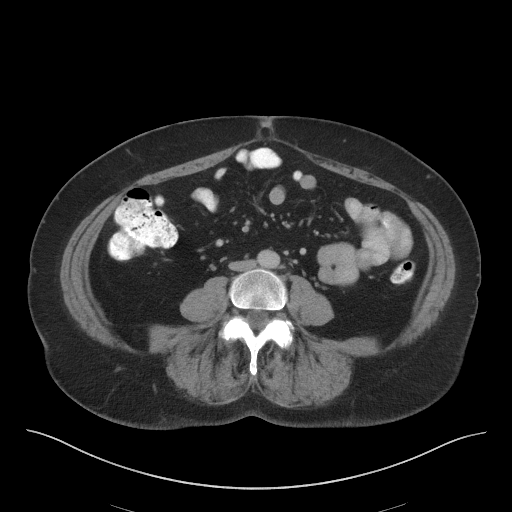
[im 54/107  soft-tissue]
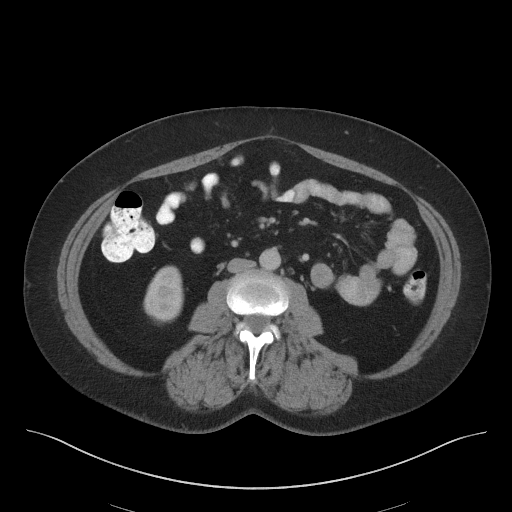
[im 59/107  soft-tissue]
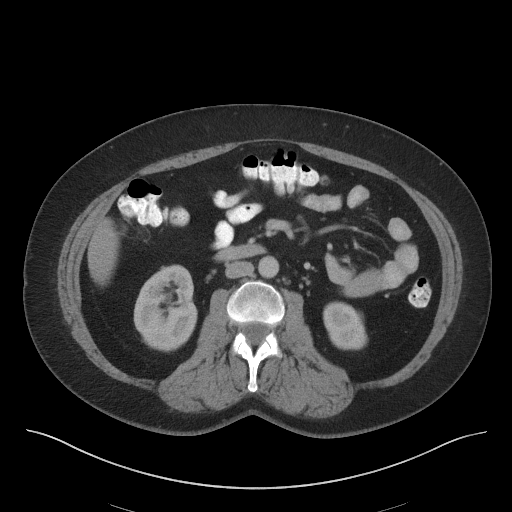
[im 71/107  soft-tissue]
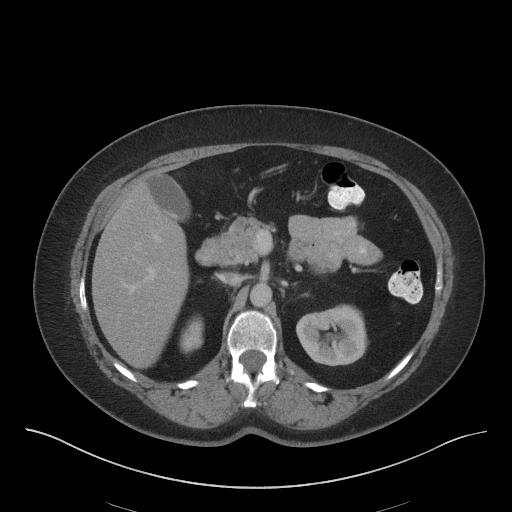
[im 71/107  bone]
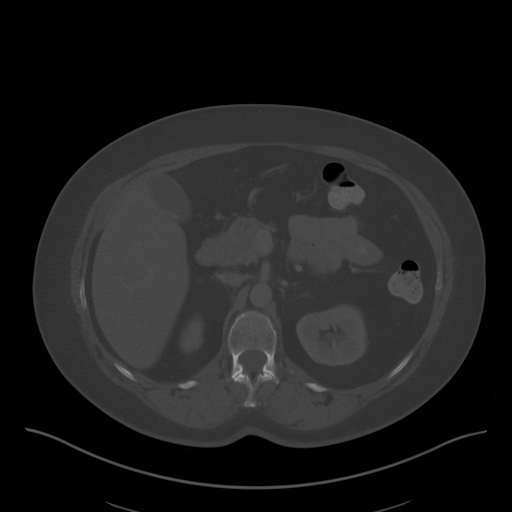
[im 77/107  soft-tissue]
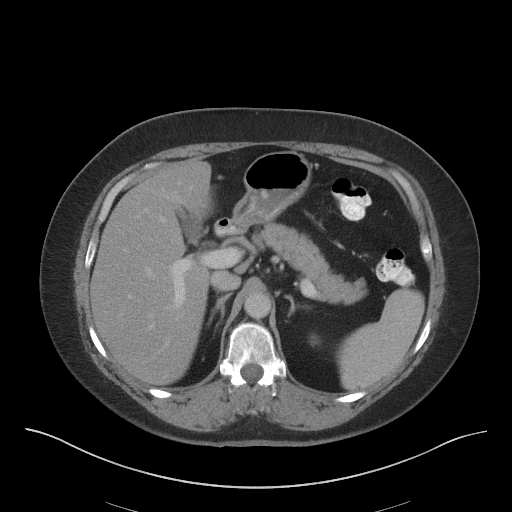
[im 83/107  soft-tissue]
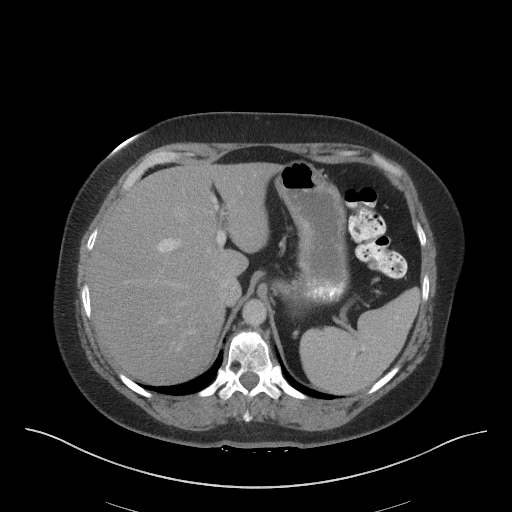
[im 95/107  soft-tissue]
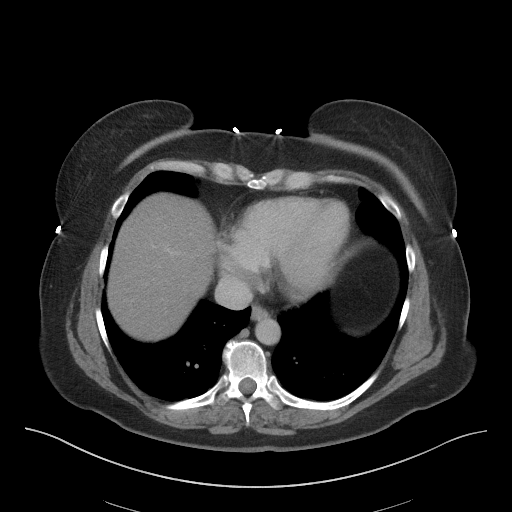
[im 101/107  soft-tissue]
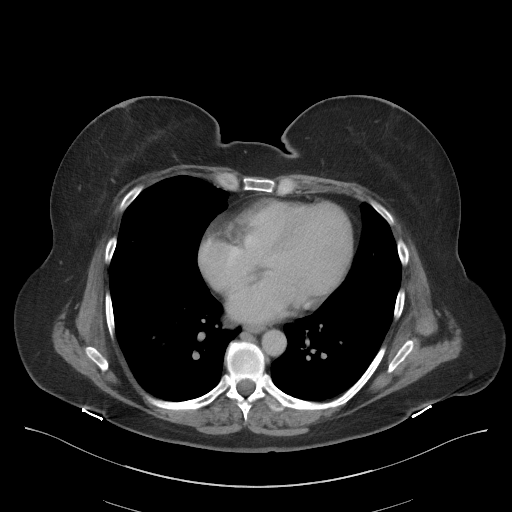

[Series 5: a/p w/ cor · coronal · 0.81mm/px · 3 of 135 slices shown]
[im 45/135  soft-tissue]
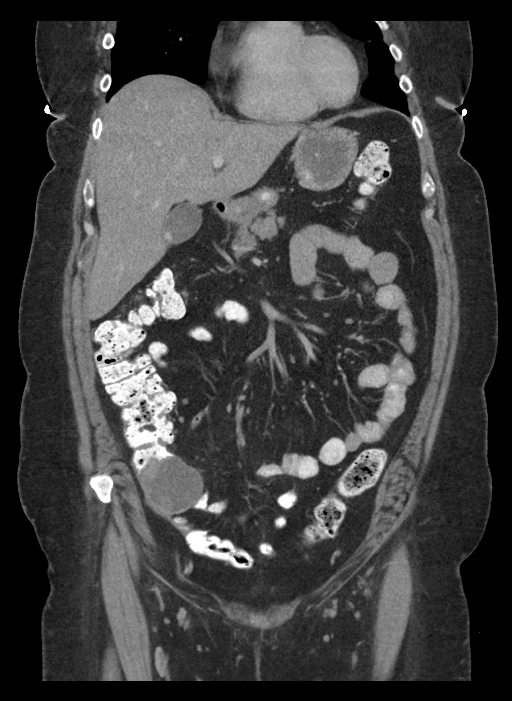
[im 60/135  soft-tissue]
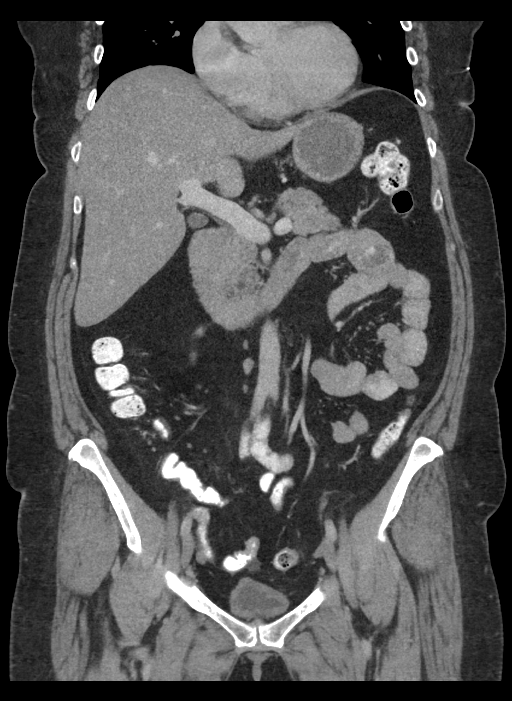
[im 75/135  soft-tissue]
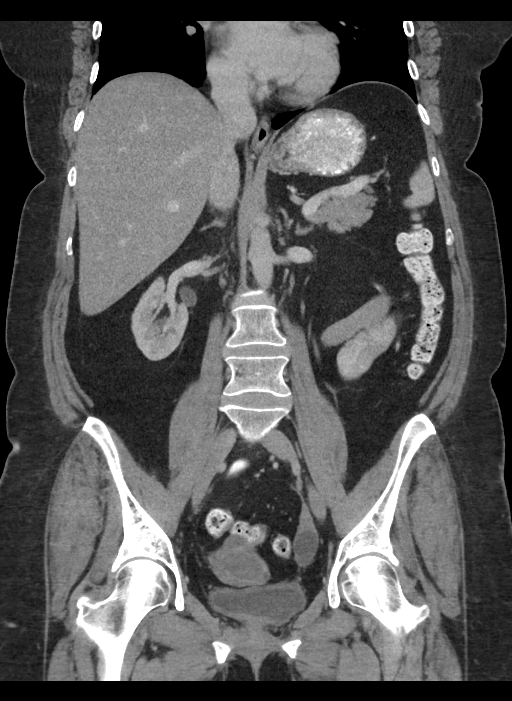

[16 of 46 positions shown; findings below may reference images not displayed]

FINDINGS: Lung bases are clear.  No effusions.  Heart is normal size.

Mild diffuse fatty infiltration of the liver. Small layering high
density in the gallbladder compatible with small stones. Spleen,
pancreas, adrenals and kidneys are unremarkable.

Persistent fluid collection noted in the right lower quadrant at the
base of the cecum and measures 4.8 x 4.2 cm compared to 5.4 x 4.6 cm
previously. Moderate free fluid noted in the cul-de-sac of the
pelvis. Bilateral ovarian cystic areas are noted, measuring 2.9 cm
on the left, stable and 3.8 cm on the right (which is near the
midline), also stable. Stomach, large and small bowel are
unremarkable. Aorta is normal caliber. No acute bony abnormality or
focal bone lesion.
IMPRESSION: Continued fluid collection near the base of the cecum concerning for
recurrent/residual abscess. This is slightly smaller than prior
study.

Bilateral ovarian cystic lesions again noted, stable. Moderate free
fluid in the cul-de-sac of the pelvis also stable.

Mild diffuse fatty infiltration of the liver.

Cholelithiasis.

## 2016-01-07 MED ORDER — IOHEXOL 300 MG/ML  SOLN
100.0000 mL | Freq: Once | INTRAMUSCULAR | Status: AC | PRN
  Administered 2016-01-07: 100 mL via INTRAVENOUS

## 2016-01-26 ENCOUNTER — Other Ambulatory Visit: Payer: Self-pay | Admitting: Nurse Practitioner

## 2016-01-26 ENCOUNTER — Ambulatory Visit: Payer: Self-pay | Admitting: General Surgery

## 2016-01-28 ENCOUNTER — Encounter (HOSPITAL_COMMUNITY): Payer: Self-pay

## 2016-01-28 ENCOUNTER — Encounter (HOSPITAL_COMMUNITY)
Admission: RE | Admit: 2016-01-28 | Discharge: 2016-01-28 | Disposition: A | Discharge status: Home / Self Care | Payer: Commercial Managed Care - HMO | Source: Ambulatory Visit | Attending: General Surgery | Admitting: General Surgery

## 2016-01-28 DIAGNOSIS — K352 Acute appendicitis with generalized peritonitis: Secondary | ICD-10-CM | POA: Insufficient documentation

## 2016-01-28 DIAGNOSIS — Z0183 Encounter for blood typing: Secondary | ICD-10-CM | POA: Diagnosis not present

## 2016-01-28 DIAGNOSIS — Z01812 Encounter for preprocedural laboratory examination: Secondary | ICD-10-CM | POA: Diagnosis not present

## 2016-01-28 DIAGNOSIS — Z01818 Encounter for other preprocedural examination: Secondary | ICD-10-CM | POA: Insufficient documentation

## 2016-01-28 DIAGNOSIS — I10 Essential (primary) hypertension: Secondary | ICD-10-CM | POA: Insufficient documentation

## 2016-01-28 HISTORY — DX: Anxiety disorder, unspecified: F41.9

## 2016-01-28 HISTORY — DX: Malignant (primary) neoplasm, unspecified: C80.1

## 2016-01-28 HISTORY — DX: Personal history of urinary (tract) infections: Z87.440

## 2016-01-28 HISTORY — DX: Thyrotoxicosis with diffuse goiter without thyrotoxic crisis or storm: E05.00

## 2016-01-28 HISTORY — DX: Anemia, unspecified: D64.9

## 2016-01-28 HISTORY — DX: Cardiac arrhythmia, unspecified: I49.9

## 2016-01-28 HISTORY — DX: Pneumonia, unspecified organism: J18.9

## 2016-01-28 HISTORY — DX: Personal history of other medical treatment: Z92.89

## 2016-01-28 LAB — CBC WITH DIFFERENTIAL/PLATELET
Basophils Absolute: 0.1 10*3/uL (ref 0.0–0.1)
Basophils Relative: 1 %
EOS ABS: 0.3 10*3/uL (ref 0.0–0.7)
EOS PCT: 3 %
HCT: 37.8 % (ref 36.0–46.0)
Hemoglobin: 12.6 g/dL (ref 12.0–15.0)
LYMPHS ABS: 2.9 10*3/uL (ref 0.7–4.0)
LYMPHS PCT: 31 %
MCH: 29.3 pg (ref 26.0–34.0)
MCHC: 33.3 g/dL (ref 30.0–36.0)
MCV: 87.9 fL (ref 78.0–100.0)
MONO ABS: 0.6 10*3/uL (ref 0.1–1.0)
Monocytes Relative: 7 %
Neutro Abs: 5.5 10*3/uL (ref 1.7–7.7)
Neutrophils Relative %: 58 %
PLATELETS: 263 10*3/uL (ref 150–400)
RBC: 4.3 MIL/uL (ref 3.87–5.11)
RDW: 12.8 % (ref 11.5–15.5)
WBC: 9.4 10*3/uL (ref 4.0–10.5)

## 2016-01-28 LAB — COMPREHENSIVE METABOLIC PANEL
ALT: 19 U/L (ref 14–54)
ANION GAP: 10 (ref 5–15)
AST: 19 U/L (ref 15–41)
Albumin: 4.1 g/dL (ref 3.5–5.0)
Alkaline Phosphatase: 43 U/L (ref 38–126)
BUN: 21 mg/dL — ABNORMAL HIGH (ref 6–20)
CHLORIDE: 102 mmol/L (ref 101–111)
CO2: 30 mmol/L (ref 22–32)
Calcium: 9.3 mg/dL (ref 8.9–10.3)
Creatinine, Ser: 1.05 mg/dL — ABNORMAL HIGH (ref 0.44–1.00)
GFR, EST NON AFRICAN AMERICAN: 59 mL/min — AB (ref >60)
Glucose, Bld: 108 mg/dL — ABNORMAL HIGH (ref 65–99)
POTASSIUM: 3.3 mmol/L — AB (ref 3.5–5.1)
SODIUM: 142 mmol/L (ref 135–145)
Total Bilirubin: 0.4 mg/dL (ref 0.3–1.2)
Total Protein: 7.2 g/dL (ref 6.5–8.1)

## 2016-01-28 LAB — ABO/RH: ABO/RH(D): O POS

## 2016-01-28 NOTE — Progress Notes (Signed)
CT abd / pelvis lungs mentioned 01/07/2016

## 2016-01-28 NOTE — Patient Instructions (Signed)
Casey [name]  01/28/2016   Your procedure is scheduled on: Monday February 02, 2016  Report to [facility] Main  Entrance take [place]  elevators to 3rd floor to  [place] at 8:45 AM.  Call this number if you have problems the morning of surgery [phone]   Remember: ONLY 1 PERSON MAY GO WITH YOU TO SHORT STAY TO GET  READY MORNING OF Casey [name].  Do not eat food or drink liquids :After Midnight.     Take these medicines the morning of surgery with A SIP OF WATER: Levothyroxine                                You may not have any metal on your body including hair pins and              piercings  Do not wear jewelry, make-up, lotions, powders or perfumes, deodorant             Do not wear nail polish.  Do not shave  48 hours prior to surgery.                Do not bring valuables to the hospital. Casey [name].  Contacts, dentures or bridgework may not be worn into surgery.  Leave suitcase in the car. After surgery it may be brought to your room.    Special Instructions: FOLLOW BOWEL PREPARATION PER SURGEON PRIOR TO SURGICAL DATE           _____________________________________________________________________             [facility] - Preparing for Surgery Before surgery, you can play an important role.  Because skin is not sterile, your skin needs to be as free of germs as possible.  You can reduce the number of germs on your skin by washing with CHG (chlorahexidine gluconate) soap before surgery.  CHG is an antiseptic cleaner which kills germs and bonds with the skin to continue killing germs even after washing. Please DO NOT use if you have an allergy to CHG or antibacterial soaps.  If your skin becomes reddened/irritated stop using the CHG and inform your nurse when you arrive at Short Stay. Do not shave (including legs and underarms) for at least 48 hours prior to the first CHG shower.  You may shave your  face/neck. Please follow these instructions carefully:  1.  Shower with CHG Soap the night before surgery and the  morning of Surgery.  2.  If you choose to wash your hair, wash your hair first as usual with your  normal  shampoo.  3.  After you shampoo, rinse your hair and body thoroughly to remove the  shampoo.                           4.  Use CHG as you would any other liquid soap.  You can apply chg directly  to the skin and wash                       Gently with a scrungie or clean washcloth.  5.  Apply the CHG Soap to your body ONLY FROM THE NECK  DOWN.   Do not use on face/ open                           Wound or open sores. Avoid contact with eyes, ears mouth and genitals (private parts).                       Wash face,  Genitals (private parts) with your normal soap.             6.  Wash thoroughly, paying special attention to the area where your surgery  will be performed.  7.  Thoroughly rinse your body with warm water from the neck down.  8.  DO NOT shower/wash with your normal soap after using and rinsing off  the CHG Soap.                9.  Pat yourself dry with a clean towel.            10.  Wear clean pajamas.            11.  Place clean sheets on your bed the night of your first shower and do not  sleep with pets. Day of Surgery : Do not apply any lotions/deodorants the morning of surgery.  Please wear clean clothes to the hospital/surgery center.  FAILURE TO FOLLOW THESE INSTRUCTIONS MAY RESULT IN THE CANCELLATION OF YOUR SURGERY PATIENT SIGNATURE_________________________________  NURSE SIGNATURE__________________________________  ________________________________________________________________________

## 2016-02-01 MED ORDER — CLINDAMYCIN PHOSPHATE 900 MG/50ML IV SOLN
900.0000 mg | INTRAVENOUS | Status: AC
  Administered 2016-02-02: 900 mg via INTRAVENOUS

## 2016-02-01 MED ORDER — GENTAMICIN SULFATE 40 MG/ML IJ SOLN
420.0000 mg | INTRAVENOUS | Status: AC
  Administered 2016-02-02: 420 mg via INTRAVENOUS
  Filled 2016-02-01: qty 10.5

## 2016-02-02 ENCOUNTER — Encounter (HOSPITAL_COMMUNITY)
Admission: RE | Disposition: A | Discharge status: Home / Self Care | Payer: Self-pay | Source: Ambulatory Visit | Attending: General Surgery

## 2016-02-02 ENCOUNTER — Inpatient Hospital Stay (HOSPITAL_COMMUNITY): Payer: Commercial Managed Care - HMO | Admitting: Anesthesiology

## 2016-02-02 ENCOUNTER — Encounter (HOSPITAL_COMMUNITY): Payer: Self-pay | Admitting: *Deleted

## 2016-02-02 ENCOUNTER — Inpatient Hospital Stay (HOSPITAL_COMMUNITY)
Admission: RE | Admit: 2016-02-02 | Discharge: 2016-02-06 | DRG: 331 | Disposition: A | Discharge status: Home / Self Care | Payer: Commercial Managed Care - HMO | Source: Ambulatory Visit | Attending: General Surgery | Admitting: General Surgery

## 2016-02-02 DIAGNOSIS — R1031 Right lower quadrant pain: Secondary | ICD-10-CM | POA: Diagnosis present

## 2016-02-02 DIAGNOSIS — Z808 Family history of malignant neoplasm of other organs or systems: Secondary | ICD-10-CM | POA: Diagnosis not present

## 2016-02-02 DIAGNOSIS — K219 Gastro-esophageal reflux disease without esophagitis: Secondary | ICD-10-CM | POA: Diagnosis present

## 2016-02-02 DIAGNOSIS — Z85820 Personal history of malignant melanoma of skin: Secondary | ICD-10-CM | POA: Diagnosis not present

## 2016-02-02 DIAGNOSIS — E039 Hypothyroidism, unspecified: Secondary | ICD-10-CM | POA: Diagnosis present

## 2016-02-02 DIAGNOSIS — Z885 Allergy status to narcotic agent status: Secondary | ICD-10-CM

## 2016-02-02 DIAGNOSIS — E669 Obesity, unspecified: Secondary | ICD-10-CM | POA: Diagnosis present

## 2016-02-02 DIAGNOSIS — Z01812 Encounter for preprocedural laboratory examination: Secondary | ICD-10-CM

## 2016-02-02 DIAGNOSIS — K5900 Constipation, unspecified: Secondary | ICD-10-CM | POA: Diagnosis present

## 2016-02-02 DIAGNOSIS — E785 Hyperlipidemia, unspecified: Secondary | ICD-10-CM | POA: Diagnosis present

## 2016-02-02 DIAGNOSIS — K649 Unspecified hemorrhoids: Secondary | ICD-10-CM | POA: Diagnosis present

## 2016-02-02 DIAGNOSIS — F329 Major depressive disorder, single episode, unspecified: Secondary | ICD-10-CM | POA: Diagnosis present

## 2016-02-02 DIAGNOSIS — Z79899 Other long term (current) drug therapy: Secondary | ICD-10-CM

## 2016-02-02 DIAGNOSIS — E7800 Pure hypercholesterolemia, unspecified: Secondary | ICD-10-CM | POA: Diagnosis present

## 2016-02-02 DIAGNOSIS — C181 Malignant neoplasm of appendix: Principal | ICD-10-CM | POA: Diagnosis present

## 2016-02-02 DIAGNOSIS — Z8249 Family history of ischemic heart disease and other diseases of the circulatory system: Secondary | ICD-10-CM | POA: Diagnosis not present

## 2016-02-02 DIAGNOSIS — Z8371 Family history of colonic polyps: Secondary | ICD-10-CM | POA: Diagnosis not present

## 2016-02-02 DIAGNOSIS — F419 Anxiety disorder, unspecified: Secondary | ICD-10-CM | POA: Diagnosis present

## 2016-02-02 DIAGNOSIS — Z888 Allergy status to other drugs, medicaments and biological substances status: Secondary | ICD-10-CM | POA: Diagnosis not present

## 2016-02-02 DIAGNOSIS — Z01810 Encounter for preprocedural cardiovascular examination: Secondary | ICD-10-CM

## 2016-02-02 DIAGNOSIS — I10 Essential (primary) hypertension: Secondary | ICD-10-CM | POA: Diagnosis present

## 2016-02-02 DIAGNOSIS — Z6833 Body mass index (BMI) 33.0-33.9, adult: Secondary | ICD-10-CM

## 2016-02-02 DIAGNOSIS — D373 Neoplasm of uncertain behavior of appendix: Secondary | ICD-10-CM | POA: Diagnosis present

## 2016-02-02 DIAGNOSIS — Z880 Allergy status to penicillin: Secondary | ICD-10-CM | POA: Diagnosis not present

## 2016-02-02 DIAGNOSIS — K429 Umbilical hernia without obstruction or gangrene: Secondary | ICD-10-CM | POA: Diagnosis present

## 2016-02-02 DIAGNOSIS — I4891 Unspecified atrial fibrillation: Secondary | ICD-10-CM | POA: Diagnosis present

## 2016-02-02 HISTORY — DX: Other specified postprocedural states: R11.2

## 2016-02-02 HISTORY — DX: Nausea with vomiting, unspecified: Z98.890

## 2016-02-02 HISTORY — PX: LAPAROSCOPIC PARTIAL COLECTOMY: SHX5907

## 2016-02-02 LAB — TYPE AND SCREEN
ABO/RH(D): O POS
ANTIBODY SCREEN: NEGATIVE

## 2016-02-02 SURGERY — LAPAROSCOPIC PARTIAL COLECTOMY
Anesthesia: General | Site: Abdomen

## 2016-02-02 MED ORDER — PROPOFOL 10 MG/ML IV BOLUS
INTRAVENOUS | Status: AC
  Filled 2016-02-02: qty 20

## 2016-02-02 MED ORDER — MIDAZOLAM HCL 5 MG/5ML IJ SOLN
INTRAMUSCULAR | Status: DC | PRN
  Administered 2016-02-02: 2 mg via INTRAVENOUS

## 2016-02-02 MED ORDER — CHLORHEXIDINE GLUCONATE 4 % EX LIQD: 60.0000 mL | Freq: Once | CUTANEOUS | Status: DC

## 2016-02-02 MED ORDER — LIDOCAINE HCL (CARDIAC) 20 MG/ML IV SOLN
INTRAVENOUS | Status: DC | PRN
  Administered 2016-02-02: 50 mg via INTRAVENOUS

## 2016-02-02 MED ORDER — FENTANYL CITRATE (PF) 250 MCG/5ML IJ SOLN
INTRAMUSCULAR | Status: AC
  Filled 2016-02-02: qty 5

## 2016-02-02 MED ORDER — DIPHENHYDRAMINE HCL 12.5 MG/5ML PO ELIX: 12.5000 mg | ORAL_SOLUTION | Freq: Four times a day (QID) | ORAL | Status: DC | PRN

## 2016-02-02 MED ORDER — LIDOCAINE HCL (CARDIAC) 20 MG/ML IV SOLN
INTRAVENOUS | Status: AC
  Filled 2016-02-02: qty 5

## 2016-02-02 MED ORDER — SUGAMMADEX SODIUM 200 MG/2ML IV SOLN
INTRAVENOUS | Status: AC
  Filled 2016-02-02: qty 2

## 2016-02-02 MED ORDER — ENOXAPARIN SODIUM 40 MG/0.4ML ~~LOC~~ SOLN
40.0000 mg | SUBCUTANEOUS | Status: DC
  Administered 2016-02-03 – 2016-02-06 (×4): 40 mg via SUBCUTANEOUS
  Filled 2016-02-02 (×6): qty 0.4

## 2016-02-02 MED ORDER — ONDANSETRON HCL 4 MG/2ML IJ SOLN
INTRAMUSCULAR | Status: DC | PRN
  Administered 2016-02-02: 4 mg via INTRAVENOUS

## 2016-02-02 MED ORDER — KCL IN DEXTROSE-NACL 20-5-0.45 MEQ/L-%-% IV SOLN
INTRAVENOUS | Status: DC
  Administered 2016-02-02 – 2016-02-05 (×5): via INTRAVENOUS
  Filled 2016-02-02 (×11): qty 1000

## 2016-02-02 MED ORDER — SUCCINYLCHOLINE CHLORIDE 20 MG/ML IJ SOLN
INTRAMUSCULAR | Status: DC | PRN
  Administered 2016-02-02: 80 mg via INTRAVENOUS

## 2016-02-02 MED ORDER — ALVIMOPAN 12 MG PO CAPS
12.0000 mg | ORAL_CAPSULE | Freq: Once | ORAL | Status: AC
  Administered 2016-02-02: 12 mg via ORAL
  Filled 2016-02-02: qty 1

## 2016-02-02 MED ORDER — HEPARIN SODIUM (PORCINE) 5000 UNIT/ML IJ SOLN
5000.0000 [IU] | Freq: Once | INTRAMUSCULAR | Status: AC
  Administered 2016-02-02: 5000 [IU] via SUBCUTANEOUS
  Filled 2016-02-02: qty 1

## 2016-02-02 MED ORDER — FENTANYL CITRATE (PF) 100 MCG/2ML IJ SOLN
INTRAMUSCULAR | Status: DC | PRN
  Administered 2016-02-02: 50 ug via INTRAVENOUS
  Administered 2016-02-02: 100 ug via INTRAVENOUS
  Administered 2016-02-02 (×2): 50 ug via INTRAVENOUS

## 2016-02-02 MED ORDER — ONDANSETRON HCL 4 MG/2ML IJ SOLN
4.0000 mg | Freq: Four times a day (QID) | INTRAMUSCULAR | Status: DC | PRN
  Administered 2016-02-02 – 2016-02-04 (×6): 4 mg via INTRAVENOUS
  Filled 2016-02-02 (×6): qty 2

## 2016-02-02 MED ORDER — BUPIVACAINE-EPINEPHRINE 0.25% -1:200000 IJ SOLN
INTRAMUSCULAR | Status: DC | PRN
  Administered 2016-02-02: 20 mL

## 2016-02-02 MED ORDER — PROPOFOL 10 MG/ML IV BOLUS
INTRAVENOUS | Status: DC | PRN
  Administered 2016-02-02: 160 mg via INTRAVENOUS

## 2016-02-02 MED ORDER — ONDANSETRON HCL 4 MG/2ML IJ SOLN: 4.0000 mg | Freq: Once | INTRAMUSCULAR | Status: DC | PRN

## 2016-02-02 MED ORDER — HYDROMORPHONE HCL 1 MG/ML IJ SOLN
INTRAMUSCULAR | Status: AC
  Filled 2016-02-02: qty 1

## 2016-02-02 MED ORDER — FENTANYL CITRATE (PF) 100 MCG/2ML IJ SOLN
25.0000 ug | INTRAMUSCULAR | Status: DC | PRN
  Administered 2016-02-02 (×4): 25 ug via INTRAVENOUS
  Administered 2016-02-03 (×5): 50 ug via INTRAVENOUS
  Filled 2016-02-02 (×9): qty 2

## 2016-02-02 MED ORDER — LACTATED RINGERS IR SOLN
Status: DC | PRN
  Administered 2016-02-02: 1000 mL

## 2016-02-02 MED ORDER — DIPHENHYDRAMINE HCL 50 MG/ML IJ SOLN: 12.5000 mg | Freq: Four times a day (QID) | INTRAMUSCULAR | Status: DC | PRN

## 2016-02-02 MED ORDER — METOPROLOL SUCCINATE ER 50 MG PO TB24
50.0000 mg | ORAL_TABLET | Freq: Every day | ORAL | Status: DC
  Administered 2016-02-03 – 2016-02-05 (×3): 50 mg via ORAL
  Filled 2016-02-02 (×5): qty 1

## 2016-02-02 MED ORDER — ONDANSETRON HCL 4 MG/2ML IJ SOLN
INTRAMUSCULAR | Status: AC
  Filled 2016-02-02: qty 2

## 2016-02-02 MED ORDER — MIDAZOLAM HCL 2 MG/2ML IJ SOLN
INTRAMUSCULAR | Status: AC
  Filled 2016-02-02: qty 2

## 2016-02-02 MED ORDER — PANTOPRAZOLE SODIUM 40 MG IV SOLR
40.0000 mg | Freq: Every day | INTRAVENOUS | Status: DC
  Administered 2016-02-02 – 2016-02-05 (×4): 40 mg via INTRAVENOUS
  Filled 2016-02-02 (×5): qty 40

## 2016-02-02 MED ORDER — LACTATED RINGERS IV SOLN
INTRAVENOUS | Status: DC | PRN
  Administered 2016-02-02 (×4): via INTRAVENOUS

## 2016-02-02 MED ORDER — ROCURONIUM BROMIDE 100 MG/10ML IV SOLN
INTRAVENOUS | Status: DC | PRN
  Administered 2016-02-02 (×2): 10 mg via INTRAVENOUS
  Administered 2016-02-02: 50 mg via INTRAVENOUS

## 2016-02-02 MED ORDER — CLINDAMYCIN PHOSPHATE 900 MG/50ML IV SOLN
INTRAVENOUS | Status: AC
  Filled 2016-02-02: qty 50

## 2016-02-02 MED ORDER — HYDROMORPHONE HCL 1 MG/ML IJ SOLN
0.2500 mg | INTRAMUSCULAR | Status: DC | PRN
  Administered 2016-02-02 (×2): 0.5 mg via INTRAVENOUS

## 2016-02-02 MED ORDER — ACETAMINOPHEN 10 MG/ML IV SOLN
1000.0000 mg | Freq: Four times a day (QID) | INTRAVENOUS | Status: AC
  Administered 2016-02-02 – 2016-02-03 (×4): 1000 mg via INTRAVENOUS
  Filled 2016-02-02 (×3): qty 100

## 2016-02-02 MED ORDER — BUPIVACAINE-EPINEPHRINE 0.25% -1:200000 IJ SOLN
INTRAMUSCULAR | Status: AC
  Filled 2016-02-02: qty 1

## 2016-02-02 MED ORDER — ZOLPIDEM TARTRATE 5 MG PO TABS
5.0000 mg | ORAL_TABLET | Freq: Every evening | ORAL | Status: DC | PRN
  Administered 2016-02-04 – 2016-02-05 (×2): 5 mg via ORAL
  Filled 2016-02-02 (×3): qty 1

## 2016-02-02 MED ORDER — ONDANSETRON HCL 4 MG PO TABS: 4.0000 mg | ORAL_TABLET | Freq: Four times a day (QID) | ORAL | Status: DC | PRN

## 2016-02-02 MED ORDER — PROMETHAZINE HCL 25 MG/ML IJ SOLN
INTRAMUSCULAR | Status: AC
  Administered 2016-02-02: 12.5 mg
  Filled 2016-02-02: qty 1

## 2016-02-02 MED ORDER — LEVOTHYROXINE SODIUM 75 MCG PO TABS
75.0000 ug | ORAL_TABLET | Freq: Every day | ORAL | Status: DC
  Administered 2016-02-03 – 2016-02-06 (×4): 75 ug via ORAL
  Filled 2016-02-02 (×5): qty 1

## 2016-02-02 MED ORDER — PROMETHAZINE HCL 25 MG/ML IJ SOLN
12.5000 mg | Freq: Four times a day (QID) | INTRAMUSCULAR | Status: DC | PRN
  Administered 2016-02-02 – 2016-02-03 (×5): 12.5 mg via INTRAVENOUS
  Filled 2016-02-02 (×7): qty 1

## 2016-02-02 MED ORDER — SUGAMMADEX SODIUM 200 MG/2ML IV SOLN
INTRAVENOUS | Status: DC | PRN
  Administered 2016-02-02: 220 mg via INTRAVENOUS

## 2016-02-02 MED ORDER — FENTANYL CITRATE (PF) 100 MCG/2ML IJ SOLN
INTRAMUSCULAR | Status: AC
  Filled 2016-02-02: qty 2

## 2016-02-02 MED ORDER — ALVIMOPAN 12 MG PO CAPS
12.0000 mg | ORAL_CAPSULE | Freq: Two times a day (BID) | ORAL | Status: DC
Start: 2016-02-03 — End: 2016-02-04
  Administered 2016-02-03 (×2): 12 mg via ORAL
  Filled 2016-02-02 (×4): qty 1

## 2016-02-02 MED ORDER — ACETAMINOPHEN 10 MG/ML IV SOLN
INTRAVENOUS | Status: AC
  Filled 2016-02-02: qty 100

## 2016-02-02 MED ORDER — PHENYLEPHRINE HCL 10 MG/ML IJ SOLN
INTRAMUSCULAR | Status: DC | PRN
  Administered 2016-02-02: 80 ug via INTRAVENOUS

## 2016-02-02 SURGICAL SUPPLY — 75 items
APPLIER CLIP 5 13 M/L LIGAMAX5 (MISCELLANEOUS)
APPLIER CLIP ROT 10 11.4 M/L (STAPLE)
APR CLP MED LRG 11.4X10 (STAPLE)
APR CLP MED LRG 5 ANG JAW (MISCELLANEOUS)
BLADE EXTENDED COATED 6.5IN (ELECTRODE) IMPLANT
BLADE HEX COATED 2.75 (ELECTRODE) ×6 IMPLANT
BRR ADH 5X3 SEPRAFILM 6 SHT (MISCELLANEOUS)
BRR ADH 6X5 SEPRAFILM 1 SHT (MISCELLANEOUS)
CELLS DAT CNTRL 66122 CELL SVR (MISCELLANEOUS) IMPLANT
CHLORAPREP W/TINT 26ML (MISCELLANEOUS) ×3 IMPLANT
CLIP APPLIE 5 13 M/L LIGAMAX5 (MISCELLANEOUS) IMPLANT
CLIP APPLIE ROT 10 11.4 M/L (STAPLE) IMPLANT
COVER SURGICAL LIGHT HANDLE (MISCELLANEOUS) ×3 IMPLANT
DRAIN CHANNEL 19F RND (DRAIN) IMPLANT
DRAPE LAPAROSCOPIC ABDOMINAL (DRAPES) ×3 IMPLANT
DRAPE UTILITY XL STRL (DRAPES) ×6 IMPLANT
DRSG OPSITE POSTOP 4X6 (GAUZE/BANDAGES/DRESSINGS) IMPLANT
DRSG OPSITE POSTOP 4X8 (GAUZE/BANDAGES/DRESSINGS) IMPLANT
DRSG TELFA 3X8 NADH (GAUZE/BANDAGES/DRESSINGS) IMPLANT
ELECT REM PT RETURN 9FT ADLT (ELECTROSURGICAL) ×3
ELECTRODE REM PT RTRN 9FT ADLT (ELECTROSURGICAL) ×1 IMPLANT
EVACUATOR SILICONE 100CC (DRAIN) IMPLANT
GAUZE SPONGE 4X4 12PLY STRL (GAUZE/BANDAGES/DRESSINGS) IMPLANT
GLOVE BIO SURGEON STRL SZ7.5 (GLOVE) ×6 IMPLANT
GLOVE INDICATOR 8.0 STRL GRN (GLOVE) ×6 IMPLANT
GOWN STRL REUS W/TWL XL LVL3 (GOWN DISPOSABLE) ×12 IMPLANT
HANDLE SUCTION POOLE (INSTRUMENTS) ×1 IMPLANT
KIT PREVENA INCISION MGT20CM45 (CANNISTER) ×2 IMPLANT
LEGGING LITHOTOMY PAIR STRL (DRAPES) IMPLANT
LIGASURE IMPACT 36 18CM CVD LR (INSTRUMENTS) ×2 IMPLANT
PACK COLON (CUSTOM PROCEDURE TRAY) ×3 IMPLANT
PAD DRESSING TELFA 3X8 NADH (GAUZE/BANDAGES/DRESSINGS) IMPLANT
PAD POSITIONING PINK XL (MISCELLANEOUS) IMPLANT
POSITIONER SURGICAL ARM (MISCELLANEOUS) IMPLANT
RELOAD PROXIMATE 75MM BLUE (ENDOMECHANICALS) ×9 IMPLANT
RELOAD STAPLE 75 3.8 BLU REG (ENDOMECHANICALS) IMPLANT
RETRACTOR WND ALEXIS 18 MED (MISCELLANEOUS) IMPLANT
RTRCTR WOUND ALEXIS 18CM MED (MISCELLANEOUS)
SEPRAFILM MEMBRANE 5X6 (MISCELLANEOUS) IMPLANT
SEPRAFILM PROCEDURAL PACK 3X5 (MISCELLANEOUS) IMPLANT
SET IRRIG TUBING LAPAROSCOPIC (IRRIGATION / IRRIGATOR) ×3 IMPLANT
SHEARS HARMONIC ACE PLUS 36CM (ENDOMECHANICALS) IMPLANT
SLEEVE SURGEON STRL (DRAPES) ×2 IMPLANT
SLEEVE XCEL OPT CAN 5 100 (ENDOMECHANICALS) IMPLANT
SOLUTION ANTI FOG 6CC (MISCELLANEOUS) ×3 IMPLANT
SPONGE LAP 18X18 X RAY DECT (DISPOSABLE) ×6 IMPLANT
STAPLER PROXIMATE 75MM BLUE (STAPLE) ×2 IMPLANT
STAPLER VISISTAT 35W (STAPLE) ×3 IMPLANT
SUCTION POOLE HANDLE (INSTRUMENTS) ×3
SUT ETHILON 2 0 PS N (SUTURE) IMPLANT
SUT PDS AB 0 CTX 60 (SUTURE) IMPLANT
SUT PDS AB 1 TP1 96 (SUTURE) IMPLANT
SUT SILK 2 0 (SUTURE) ×3
SUT SILK 2 0 SH CR/8 (SUTURE) ×3 IMPLANT
SUT SILK 2 0SH CR/8 30 (SUTURE) IMPLANT
SUT SILK 2-0 18XBRD TIE 12 (SUTURE) ×1 IMPLANT
SUT SILK 2-0 30XBRD TIE 12 (SUTURE) IMPLANT
SUT SILK 3 0 (SUTURE) ×3
SUT SILK 3 0 SH CR/8 (SUTURE) ×3 IMPLANT
SUT SILK 3-0 18XBRD TIE 12 (SUTURE) ×1 IMPLANT
SUT VIC AB 0 UR5 27 (SUTURE) ×2 IMPLANT
SUT VIC AB 1 CTX 18 (SUTURE) IMPLANT
SUT VIC AB 3-0 SH 18 (SUTURE) IMPLANT
SYS LAPSCP GELPORT 120MM (MISCELLANEOUS) ×3
SYSTEM LAPSCP GELPORT 120MM (MISCELLANEOUS) IMPLANT
TAPE CLOTH 4X10 WHT NS (GAUZE/BANDAGES/DRESSINGS) IMPLANT
TAPE UMBILICAL COTTON 1/8X30 (MISCELLANEOUS) IMPLANT
TOWEL OR 17X26 10 PK STRL BLUE (TOWEL DISPOSABLE) ×6 IMPLANT
TOWEL OR NON WOVEN STRL DISP B (DISPOSABLE) ×6 IMPLANT
TRAY FOLEY W/METER SILVER 14FR (SET/KITS/TRAYS/PACK) ×3 IMPLANT
TRAY FOLEY W/METER SILVER 16FR (SET/KITS/TRAYS/PACK) ×3 IMPLANT
TROCAR BLADELESS OPT 5 100 (ENDOMECHANICALS) ×4 IMPLANT
TROCAR XCEL 12X100 BLDLESS (ENDOMECHANICALS) IMPLANT
TROCAR XCEL NON-BLD 11X100MML (ENDOMECHANICALS) IMPLANT
TUBING INSUF HEATED (TUBING) ×3 IMPLANT

## 2016-02-02 NOTE — H&P (Signed)
Glade [name] Location: [facility] Patient #: [id number] DOB: 1960-12-16 Married / Language: English / Race: White Female   History of Present Illness Randall Hiss M. Elston Aldape MD; ) The patient is a 55 year old female who presents with appendicitis. She is referred by Chevis Pretty, FNP for a 2nd opinion regarding her recurrent RLQ abscess. Patient states that in September she had some right lower quadrant pain. They initially thought that it might be diverticulitis and so she went to the emergency department. She was found to have ruptured appendicitis. There was an abscess so she underwent percutaneous drain placement. She states that the drain placement was very painful at the end of the procedure. She states that the drain was in for about a week and that she was doing much better and the drain was removed. She did well for a while but had recurrent right lower quadrant pain in December. A repeat CT scan at that time demonstrated a recurrent abscess measuring 5.6 x 4.6 cm. There is no evidence of contrast extravasation or contrast entering the collection. The appendix was not identified. The surrounding intestine looked unremarkable. She states that she followed up with Dr. Arnoldo Morale who recommended a repeat percutaneous drain however she declined because of the significant pain she had with insertion. They decided on 2 weeks of antibiotics and she felt better. However recently she states that she just hasn't felt great. She has been able to do her daily activities however she has occasional colicky episodes of right lower quadrant pain. She denies any fevers or chills. She reports a good appetite. She has daily bowel movements but they can range from loose to constipation which is unchanged for her. She has never had a colonoscopy. She states that she thinks all this might have started actually back in March of last year when she had severe right lower quadrant pain and it  was thought to be a bladder infection and was given some antibiotics and everything got better. She states that Dr. Arnoldo Morale has discussed the high likelihood of needing an ileocecectomy in order to get her ultimately better. She just wanted a second opinion and that's why she came down here. Her drain cultures were negative.   Problem List/Past Medical Randall Hiss Ronnie Derby, MD; ) INTRA-ABDOMINAL ABSCESS (K65.1) RUPTURED APPENDICITIS (K35.2)  Other Problems Gayland Curry, MD; ) Anxiety Disorder Atrial Fibrillation Back Pain Depression Gastroesophageal Reflux Disease Hemorrhoids High blood pressure Hypercholesterolemia Inguinal Hernia Melanoma Migraine Headache Thyroid Disease Transfusion history  Past Surgical History Elbert Ewings, CMA; ) Hysterectomy (not due to cancer) - Partial Tonsillectomy  Diagnostic Studies History Elbert Ewings, CMA; ) Colonoscopy never Mammogram within last year Pap Smear 1-5 years ago  Allergies Elbert Ewings, CMA;) Zosyn *PENICILLINS* Labetolol HCl *CHEMICALS* Itching. Morphine Sulfate *ANALGESICS - OPIOID* Itching.  Medication History Elbert Ewings, CMA;) Escitalopram Oxalate (10MG  Tablet, Oral) Active. Fenofibrate (160MG  Tablet, Oral) Active. Levothyroxine Sodium (75MCG Tablet, Oral) Active. Metoprolol Succinate ER (50MG  Tablet ER 24HR, Oral) Active. Pantoprazole Sodium (40MG  Tablet DR, Oral as needed) Active. Valsartan-Hydrochlorothiazide (160-12.5MG  Tablet, Oral) Active. Tylenol (500MG  Capsule, Oral) Active. ZyrTEC (10MG  Tablet, Oral) Active. Medications Reconciled  Social History Elbert Ewings, CMA;) Caffeine use Carbonated beverages.  Family History Elbert Ewings, CMA; ) Arthritis Daughter, Mother. Colon Polyps Father. Depression Daughter. Hypertension Mother. Melanoma Mother. Migraine Headache Daughter, Mother. Seizure disorder Daughter.  Pregnancy / Birth History Elbert Ewings, CMA; ) Age at  menarche 22 years. Age of menopause <45 Gravida 3 Maternal age 23-25 Para  3    Review of Systems Elbert Ewings CMA; ) General Present- Night Sweats and Weight Gain. Not Present- Appetite Loss, Chills, Fatigue, Fever and Weight Loss. Skin Present- Dryness. Not Present- Change in Wart/Mole, Hives, Jaundice, New Lesions, Non-Healing Wounds, Rash and Ulcer. HEENT Present- Seasonal Allergies, Sore Throat and Wears glasses/contact lenses. Not Present- Earache, Hearing Loss, Hoarseness, Nose Bleed, Oral Ulcers, Ringing in the Ears, Sinus Pain, Visual Disturbances and Yellow Eyes. Respiratory Present- Snoring. Not Present- Bloody sputum, Chronic Cough, Difficulty Breathing and Wheezing. Breast Not Present- Breast Mass, Breast Pain, Nipple Discharge and Skin Changes. Cardiovascular Present- Leg Cramps and Palpitations. Not Present- Chest Pain, Difficulty Breathing Lying Down, Rapid Heart Rate, Shortness of Breath and Swelling of Extremities. Gastrointestinal Present- Abdominal Pain, Bloating, Change in Bowel Habits, Constipation, Hemorrhoids, Indigestion and Nausea. Not Present- Bloody Stool, Chronic diarrhea, Difficulty Swallowing, Excessive gas, Gets full quickly at meals, Rectal Pain and Vomiting. Female Genitourinary Not Present- Frequency, Nocturia, Painful Urination, Pelvic Pain and Urgency. Musculoskeletal Present- Back Pain, Joint Pain and Joint Stiffness. Not Present- Muscle Pain, Muscle Weakness and Swelling of Extremities. Neurological Present- Headaches. Not Present- Decreased Memory, Fainting, Numbness, Seizures, Tingling, Tremor, Trouble walking and Weakness. Psychiatric Present- Change in Sleep Pattern and Depression. Not Present- Anxiety, Bipolar, Fearful and Frequent crying. Endocrine Present- Hot flashes. Not Present- Cold Intolerance, Excessive Hunger, Hair Changes, Heat Intolerance and New Diabetes. Hematology Not Present- Easy Bruising, Excessive bleeding, Gland problems, HIV and  Persistent Infections.  Vitals Elbert Ewings CMA; ) 12/24/2015 11:23 AM Weight: 239 lb Height: 70.5in Body Surface Area: 2.26 m Body Mass Index: 33.81 kg/m  Temp.: 36F(Temporal)  Pulse: 70 (Regular)  BP: 130/70 (Sitting, Left Arm, Standard)       Physical Exam Randall Hiss M. Jalonda Antigua MD; ) General Mental Status-Alert. General Appearance-Consistent with stated age. Hydration-Well hydrated. Voice-Normal.  Head and Neck Head-normocephalic, atraumatic with no lesions or palpable masses. Trachea-midline. Thyroid Gland Characteristics - normal size and consistency.  Eye Eyeball - Bilateral-Extraocular movements intact. Sclera/Conjunctiva - Bilateral-No scleral icterus.  Chest and Lung Exam Chest and lung exam reveals -quiet, even and easy respiratory effort with no use of accessory muscles and on auscultation, normal breath sounds, no adventitious sounds and normal vocal resonance. Inspection Chest Wall - Normal. Back - normal.  Breast - Did not examine.  Cardiovascular Cardiovascular examination reveals -normal heart sounds, regular rate and rhythm with no murmurs and normal pedal pulses bilaterally.  Abdomen Inspection  Inspection of the abdomen reveals: Note: small umbilical hernia. Skin - Scar - no surgical scars. Palpation/Percussion Palpation and Percussion of the abdomen reveal - Soft, Non Tender, No Rebound tenderness, No Rigidity (guarding) and No hepatosplenomegaly. Auscultation Auscultation of the abdomen reveals - Bowel sounds normal. Note: perhaps some subtle TTP in RLQ - certainly no RT/guarding.   Peripheral Vascular Upper Extremity Palpation - Pulses bilaterally normal.  Neurologic Neurologic evaluation reveals -alert and oriented x 3 with no impairment of recent or remote memory. Mental Status-Normal.  Neuropsychiatric The patient's mood and affect are described as -normal. Judgment and Insight-insight is  appropriate concerning matters relevant to self.  Musculoskeletal Normal Exam - Left-Upper Extremity Strength Normal and Lower Extremity Strength Normal. Normal Exam - Right-Upper Extremity Strength Normal and Lower Extremity Strength Normal.  Lymphatic Head & Neck  General Head & Neck Lymphatics: Bilateral - Description - Normal. Axillary - Did not examine. Femoral & Inguinal - Did not examine.    Assessment & Plan Randall Hiss M. Cleopha Indelicato MD; ) RUPTURED APPENDICITIS (K35.2) Impression:  I told her and her husband I agreed with everything Dr. Arnoldo Morale had told them. It appears that ultimately she may end up needing a probable ileocecectomy. However I recommended first getting an up-to-date CT scan to see what is going on with the fluid collection. If it is still present and I recommended percutaneous drain. Whether or not this will definitively deal with preventing recurrent formation of a fluid collection, I explained to them that I doubt it would. Ideally a colonoscopy would be beneficial however I doubt gastroenterology would be comfortable performing a colonoscopy in the setting. We are going to discuss what type of scan to order with interventional radiology. We will contact the patient later today or tomorrow with her CT plan. In the interim we will check a CBC and creatinine. I will follow up with her by phone after her repeat CT. Whether or not she chooses to follow up with me or Dr. Arnoldo Morale is at her discretion. Right now I do not believe she needs antibiotics Current Plans CREATININE BLOOD (82565) CBC, PLATELETS & AUT DIFF NZ:[id number]) Pt Education - [facility] Free Text Education/Instructions: discussed with patient and provided information. Follow Up - Call [facility] office after tests / studies done to discuss further plans INTRA-ABDOMINAL ABSCESS (K65.1)  [name]. [name], MD, FACS General, Bariatric, & Minimally Invasive Surgery [facility], [place]

## 2016-02-02 NOTE — Brief Op Note (Addendum)
02/02/2016  1:30 PM  PATIENT:  Casey [name]  55 y.o. female  PRE-OPERATIVE DIAGNOSIS:  chronic Perforated appendicitis   POST-OPERATIVE DIAGNOSIS:  Appendiceal tumor  PROCEDURE:  Procedure(s): LAPAROSCOPIC PARTIAL COlectomy right colon (N/A)  PLACEMENT OF PREVENA SKIN INCISION WOUND VAC  SURGEON:  Surgeon(s) and Role:    * Greer Pickerel, MD - Primary    * Mickeal Skinner, MD - Assisting  PHYSICIAN ASSISTANT: none  ASSISTANTS: see above   ANESTHESIA:   general  EBL:  Total I/O In: 3000 [I.V.:3000] Out: 165 [Urine:115; Blood:50]  BLOOD ADMINISTERED:none  DRAINS: Urinary Catheter (Foley)   LOCAL MEDICATIONS USED:  MARCAINE     SPECIMEN:  Source of Specimen:  right colon with extended ileum. stitch marks TI.   DISPOSITION OF SPECIMEN:  PATHOLOGY  COUNTS:  YES  TOURNIQUET:  * No tourniquets in log *  DICTATION: .Other Dictation: Dictation Number [id number]  PLAN OF CARE: Admit to inpatient   PATIENT DISPOSITION:  PACU - hemodynamically stable.   Delay start of Pharmacological VTE agent (>24hrs) due to surgical blood loss or risk of bleeding: no  [name]. [name], MD, FACS General, Bariatric, & Minimally Invasive Surgery [facility], [place]

## 2016-02-02 NOTE — Anesthesia Preprocedure Evaluation (Addendum)
Anesthesia Evaluation  Patient identified by MRN, date of birth, ID band Patient awake    Reviewed: Allergy & Precautions, NPO status , Patient's Chart, lab work & pertinent test results  History of Anesthesia Complications (+) PONVNegative for: history of anesthetic complications  Airway Mallampati: II  TM Distance: >3 FB Neck ROM: Full    Dental no notable dental hx. (+) Dental Advisory Given   Pulmonary neg pulmonary ROS,    Pulmonary exam normal breath sounds clear to auscultation       Cardiovascular hypertension, Pt. on medications Normal cardiovascular exam+ dysrhythmias  Rhythm:Regular Rate:Normal     Neuro/Psych  Headaches, PSYCHIATRIC DISORDERS Anxiety Depression    GI/Hepatic negative GI ROS, Neg liver ROS, GERD  Medicated and Controlled,  Endo/Other  Hypothyroidism   Renal/GU negative Renal ROS  negative genitourinary   Musculoskeletal negative musculoskeletal ROS (+)   Abdominal   Peds negative pediatric ROS (+)  Hematology negative hematology ROS (+)   Anesthesia Other Findings   Reproductive/Obstetrics negative OB ROS                            Anesthesia Physical Anesthesia Plan  ASA: II  Anesthesia Plan: General   Post-op Pain Management:    Induction: Intravenous  Airway Management Planned: Oral ETT  Additional Equipment:   Intra-op Plan:   Post-operative Plan: Extubation in OR  Informed Consent: I have reviewed the patients History and Physical, chart, labs and discussed the procedure including the risks, benefits and alternatives for the proposed anesthesia with the patient or authorized representative who has indicated his/her understanding and acceptance.   Dental advisory given  Plan Discussed with: CRNA  Anesthesia Plan Comments: (Patient reports that she has had dilaudid in the past and it works great for her with no side effects)        Anesthesia Quick Evaluation

## 2016-02-02 NOTE — Interval H&P Note (Signed)
History and Physical Interval Note:  02/02/2016 10:42 AM  Casey [name]  has presented today for surgery, with the diagnosis of Perforated appendicitis , intra abdominal abcess   The various methods of treatment have been discussed with the patient and family. After consideration of risks, benefits and other options for treatment, the patient has consented to  Procedure(s): LAPAROSCOPIC PARTIAL COLECTOMY DRAINAGE OF INTRA ABDOMINAL ABCESS  (N/A) as a surgical intervention .  The patient's history has been reviewed, patient examined, no change in status, stable for surgery.  I have reviewed the patient's chart and labs.  Questions were answered to the patient's satisfaction.    [name]. [name], MD, [place], Bariatric, & Minimally Invasive Surgery [name] Surgery, [place]   [facility] M

## 2016-02-02 NOTE — Anesthesia Procedure Notes (Signed)
Procedure Name: Intubation Performed by: Huda Petrey J Pre-anesthesia Checklist: Patient identified, Emergency Drugs available, Suction available, Patient being monitored and Timeout performed Patient Re-evaluated:Patient Re-evaluated prior to inductionOxygen Delivery Method: Circle system utilized Preoxygenation: Pre-oxygenation with 100% oxygen Intubation Type: IV induction Ventilation: Mask ventilation without difficulty Laryngoscope Size: Mac and 4 Grade View: Grade II Tube type: Oral Tube size: 7.0 mm Number of attempts: 1 Airway Equipment and Method: Stylet Placement Confirmation: ETT inserted through vocal cords under direct vision,  positive ETCO2,  CO2 detector and breath sounds checked- equal and bilateral Secured at: 21 cm Tube secured with: Tape Dental Injury: Teeth and Oropharynx as per pre-operative assessment        

## 2016-02-02 NOTE — Progress Notes (Signed)
Notified Dr Imogene Burn of preop potassium level.

## 2016-02-02 NOTE — Transfer of Care (Signed)
Immediate Anesthesia Transfer of Care Note  Patient: Casey [name]  Procedure(s) Performed: Procedure(s): LAPAROSCOPIC PARTIAL COlectomy right colon (N/A)  Patient Location: PACU  Anesthesia Type:General  Level of Consciousness: awake, alert  and oriented  Airway & Oxygen Therapy: Patient Spontanous Breathing and Patient connected to face mask oxygen  Post-op Assessment: Report given to RN and Post -op Vital signs reviewed and stable  Post vital signs: Reviewed and stable  Last Vitals:  Filed Vitals:   02/02/16 0857  BP: 136/77  Pulse: 73  Temp: 37.2 C  Resp: 16    Complications: No apparent anesthesia complications

## 2016-02-02 NOTE — Anesthesia Postprocedure Evaluation (Signed)
Anesthesia Post Note  Patient: Martiza Rask  Procedure(s) Performed: Procedure(s) (LRB): LAPAROSCOPIC PARTIAL COlectomy right colon (N/A)  Patient location during evaluation: PACU Anesthesia Type: General Level of consciousness: awake and alert Pain management: pain level controlled Vital Signs Assessment: post-procedure vital signs reviewed and stable Respiratory status: spontaneous breathing, nonlabored ventilation, respiratory function stable and patient connected to nasal cannula oxygen Cardiovascular status: blood pressure returned to baseline and stable Postop Assessment: no signs of nausea or vomiting Anesthetic complications: no    Last Vitals:  Filed Vitals:   02/02/16 1459 02/02/16 1559  BP: 144/76 131/76  Pulse:  66  Temp: 37 C 36.4 C  Resp: 16 16    Last Pain:  Filed Vitals:   02/02/16 1600  PainSc: 10-Worst pain ever                 Donney Caraveo JENNETTE

## 2016-02-03 LAB — BASIC METABOLIC PANEL
Anion gap: 8 (ref 5–15)
BUN: 9 mg/dL (ref 6–20)
CALCIUM: 8.6 mg/dL — AB (ref 8.9–10.3)
CO2: 30 mmol/L (ref 22–32)
CREATININE: 1.02 mg/dL — AB (ref 0.44–1.00)
Chloride: 103 mmol/L (ref 101–111)
GFR calc Af Amer: >60 mL/min (ref >60)
GFR calc non Af Amer: >60 mL/min (ref >60)
GLUCOSE: 154 mg/dL — AB (ref 65–99)
Potassium: 3.4 mmol/L — ABNORMAL LOW (ref 3.5–5.1)
SODIUM: 141 mmol/L (ref 135–145)

## 2016-02-03 LAB — CBC
HCT: 35.4 % — ABNORMAL LOW (ref 36.0–46.0)
Hemoglobin: 11.7 g/dL — ABNORMAL LOW (ref 12.0–15.0)
MCH: 28.5 pg (ref 26.0–34.0)
MCHC: 33.1 g/dL (ref 30.0–36.0)
MCV: 86.3 fL (ref 78.0–100.0)
PLATELETS: 178 10*3/uL (ref 150–400)
RBC: 4.1 MIL/uL (ref 3.87–5.11)
RDW: 12.9 % (ref 11.5–15.5)
WBC: 7.8 10*3/uL (ref 4.0–10.5)

## 2016-02-03 LAB — PHOSPHORUS: Phosphorus: 2.7 mg/dL (ref 2.5–4.6)

## 2016-02-03 LAB — MAGNESIUM: MAGNESIUM: 1.6 mg/dL — AB (ref 1.7–2.4)

## 2016-02-03 MED ORDER — FENTANYL 40 MCG/ML IV SOLN
INTRAVENOUS | Status: DC
  Administered 2016-02-03: 11.63 ug via INTRAVENOUS
  Administered 2016-02-03: 11:00:00 via INTRAVENOUS
  Administered 2016-02-03: 210 ug via INTRAVENOUS
  Administered 2016-02-04: 1 ug via INTRAVENOUS
  Administered 2016-02-04: 1.87 ug via INTRAVENOUS
  Administered 2016-02-04: 30 ug via INTRAVENOUS
  Administered 2016-02-04: 1.5 ug via INTRAVENOUS
  Administered 2016-02-04: 30 ug via INTRAVENOUS
  Administered 2016-02-05: 15 ug via INTRAVENOUS
  Administered 2016-02-05: 1 ug via INTRAVENOUS
  Administered 2016-02-05: 0.5 ug via INTRAVENOUS
  Filled 2016-02-03: qty 25

## 2016-02-03 MED ORDER — SODIUM CHLORIDE 0.9% FLUSH: 9.0000 mL | INTRAVENOUS | Status: DC | PRN

## 2016-02-03 MED ORDER — NALOXONE HCL 0.4 MG/ML IJ SOLN: 0.4000 mg | INTRAMUSCULAR | Status: DC | PRN

## 2016-02-03 MED ORDER — ESCITALOPRAM OXALATE 10 MG PO TABS
10.0000 mg | ORAL_TABLET | Freq: Every day | ORAL | Status: DC
  Administered 2016-02-03 – 2016-02-06 (×4): 10 mg via ORAL
  Filled 2016-02-03 (×4): qty 1

## 2016-02-03 MED ORDER — ACETAMINOPHEN 325 MG PO TABS
650.0000 mg | ORAL_TABLET | Freq: Four times a day (QID) | ORAL | Status: AC | PRN
  Administered 2016-02-03: 650 mg via ORAL
  Filled 2016-02-03: qty 2

## 2016-02-03 NOTE — Progress Notes (Signed)
Utilization Review Complete. CM will follow for progression and home needs.

## 2016-02-03 NOTE — Addendum Note (Signed)
Addendum  created 02/03/16 [id number] by Lollie Sails, CRNA   Modules edited: Charges VN

## 2016-02-03 NOTE — Op Note (Signed)
NAMEMARLENI, WARBINGTON NO.:  [id number]  MEDICAL RECORD NO.:  [id number]  LOCATION:  1536                         FACILITY:  [facility]  PHYSICIAN:  [name]. [name], MD, FACSDATE OF BIRTH:  1961/09/05  DATE OF PROCEDURE:  02/02/2016 DATE OF DISCHARGE:                              OPERATIVE REPORT   PREOPERATIVE DIAGNOSIS:  Chronic perforated appendicitis.  POSTOPERATIVE DIAGNOSIS:  Appendiceal tumor.  PROCEDURE:  Laparoscopic right colectomy.  SURGEON:  [name]. [name], MD, FACS  ASSISTANT SURGEON:  Gurney Maxin, MD  ANESTHESIA:  General.  EBL:  50.  SPECIMEN:  Right colon with extended terminal ileum stitch marks, TI.  INDICATIONS FOR PROCEDURE:  The patient is a 55 year old female, who developed right lower quadrant pain back in the fall of 2016.  She presented to [facility] and was found to have a rim-enhancing fluid collection in the right lower quadrant next to the cecum, concerning for ruptured appendicitis.  She underwent drain placement and symptomatically improved.  Her drain was pulled based on clinical improvement.  Interestingly, her drain grew no bacteria.  She did well until December when she had recurrent right lower quadrant pain.  A repeat scan demonstrated recurrent fluid collection next to the cecum without visualization of the appendix.  She went back to the initial physician who recommended repeat drain placement along with probable ileocecectomy thinking that this would not get better without resection. She was not interested in the drain and she sought out the second opinion.  I saw her back in February.  I recommended getting a repeat scan to see what was going on currently.  The fluid specialist may be a little bit smaller, but still there.  Recommended partial colectomy, so I did not feel this could be separated from the cecum since it involved the colon.  We discussed that this could be potentially neoplasm.  We discussed  at length the risks and benefits of the procedure including, but not limited to, bleeding, infection, injury to surrounding structures, need to convert to an open procedure, anastomotic stricture, anastomotic leak, blood clot formation, incisional hernia, wound infection, need for additional procedures, perioperative cardiac and pulmonary events as well as typical recovery course.  She elected to proceed to surgery.  DESCRIPTION OF PROCEDURE:  After obtaining informed consent, the patient was given 5000 units of heparin preoperatively for DVT prophylaxis as well as a dose of Entereg.  She was then taken to the OR 6 at [facility], placed supine on the OR table.  General endotracheal anesthesia was established.  A surgical time-out was performed.  A Foley catheter was placed.  Her left arm was tucked at her side with appropriate padding.  Her abdomen was prepped and draped in usual standard surgical fashion.  She received IV antibiotics prior to skin incision.  A small infraumbilical incision was made with #11 blade after local had been infiltrated.  The fascia was grasped and lifted anteriorly.  The fascia was incised, it was thin and attenuated.  The abdominal cavity was entered.  A pursestring suture was placed around the fascial edges with 0 Vicryl and a 12-mm Hasson trocar was placed.  Pneumoperitoneum was  smoothly established up to the patient pressure of 15 mmHg.  The laparoscope was advanced, so the abdominal cavity was surveilled.  There was no evidence of injury to surrounding structures.  Two 5 trocars were placed, one in the lower midline and one in the left lower quadrant all under direct visualization.  Ultimately throughout the procedure, I ended up adding two additional 5-mm trocars, one in the right lower quadrant and one in the left upper quadrant all under direct visualization after local had been infiltrated.  The patient was placed in Trendelenburg and rotated to the  left.  I identified her cecum and it appeared enlarged.  Appeared to be a mass involving the anterolateral wall of the cecum.  It was hard and thickened, this was concerning more for potential malignancy than a straightforward ruptured appendicitis. She had one adhesive band to the anterior abdominal wall, which was taken down with Harmonic scalpel. there appeared to be a segment of small bowel tethered to the medial aspect of the cecum.  It was intimately attached, this was not the terminal ileum.  It was approximately 30 cm from the terminal ileum.  At this point, I decided that we would need to do a formal right colectomy because of the concern for potential malignancy and that ileocecectomy would not be enough. Moreover, I felt that we would probably to take the attached small bowel to the cecum en bloc due to its intimate association with the cecum.  We visualized the liver, both lobes appeared grossly normal.  There was no evidence of other abnormal intraabdominal pathology.  The congenital bands of the terminal ileum to the pelvic sidewall were taken down with Harmonic scalpel.  I then took down the white line of Toldt with the Harmonic scalpel while Dr. Kieth Brightly rolling the cecum and ascending colon medially.  We stayed anterior to Gerota's fascia and approached the hepatic flexure.  At this point, we placed the patient in reverse Trendelenburg, identified the transverse colon, gallbladder and the stomach.  The gastrocolic ligament was incised in the midportion of the transverse colon and then bluntly dissected the transverse colon downward and identified a plane by taking down the attachments with Harmonic scalpel.  We came around the hepatic flexure, we identified the duodenum and it was preserved.  We continued to roll the colon medially and lift the hepatic flexure with the colon up and taken down to mobilize it medially.  At this point, I felt we had achieved  enough mobilization in order to exteriorize the cecum.  The infraumbilical incision was extended up and around to above the umbilicus with a 15- blade.  The deep dermis was divided with electrocautery.  The fascia was entered.  Pneumoperitoneum was released.  A wound protector was placed. I was able to exteriorize the cecum.  There was a segment of proximal ileum, intimately fused to the medial wall of the cecum.  It was approximately 30 cm to 35 cm from the ileocecal valve.  There had been a rent made in the mesentery of the TI and it had a little bit of bleeding, so I went ahead and divided this and divided the TI with GIA stapler with the blue load for hemostasis purposes.  I felt having two anastomoses this close would not be ideal, thus, I decided that I needed to take that entire segment of small bowel.  I identified the transverse colon, the middle colonic vessel and the right branch of the middle colonic vascular pedicle.  I then made a small window in the mesocolon just distal to the right branch of the middle colic vessel and transected the colon with another fire of the GIA 75 stapler.  I then made a small window in the mesentery just upstream about 2 cm from where the small bowel had been fused to the cecum in the proximal ileum and divided the small bowel with this fire of the GIA 75 stapler with the blue load.  We then took down the mesentery in sequential fashion using the LigaSure device.  When we got to the ileocolonic pedicle, it was clamped with a Claiborne Billings, then transected distally with the LigaSure.  It was then tied with a 2-0 silk tie as well as a 2-0 silk stitch LigaSure.  This freed our specimen, I sent it down for fresh analysis.  At this point, we brought the proximal ileum back up to the transverse colon in a side-to- side fashion and an antimesenteric-to-antimesenteric fashion.  A stay suture was placed just next to the staple line in the small bowel and the  transverse colon and secured with the hemostat.  Enterotomies were made into the small bowel and transverse colon.  One fork of the GIA stapler was placed through each enterotomy and the staple was brought together and fired to create a common channel.  The common defect was then closed with a single fire of the TA60 stapler.  The staple line was then oversewed with interrupted 3-0 silk sutures in a Lembert fashion. A 3-0 silk suture was placed in the crotch of the anastomosis.  It was widely patent.  The mesenteric defect was quite large and I did not feel I could get adequate closure of it without creating a small window. Therefore, I kept it open.  The small bowel mesentery had not been twisted.  The abdomen was irrigated with saline.  The omentum was then placed on top of the small bowel.  I then removed the wound protector and we switched to clean procedure at this point.  A suction irrigator, Bovie was passed off the field and we placed new drapes around the abdomen, changed out gloves and gowns.  The remaining midline incision was closed with the running #1 looped PDS, one from above and one from below.  We then reestablished the pneumoperitoneum, confirmed our closure was airtight.  There was nothing trapped from that closure. Pneumoperitoneum was released.  Remaining trocars were removed.  The subcutaneous tissue and the midline were irrigated with saline, then closed with skin staples.  The 4 trocar sites were closed with skin staples as well.  A Prevena skin incision VAC was placed over the midline incision.  All needle, instrument and sponge counts were correct x2.  There were no immediate complications.  The patient tolerated the procedure well.     [name]. [name], MD, FACS     EMW/MEDQ  D:  02/02/2016  T:  02/03/2016  Job:  [id number]

## 2016-02-03 NOTE — Progress Notes (Signed)
1 Day Post-Op  Subjective: Fair amount of pain and nausea. Pain meds don't last long enough. Requests her lexapro. Pain with coughing.   Objective: Vital signs in last 24 hours: Temp:  [97.4 F (36.3 C)-99.8 F (37.7 C)] 99.8 F (37.7 C) (03/14 0518) Pulse Rate:  [63-84] 63 (03/14 0518) Resp:  [14-33] 14 (03/14 0518) BP: (125-151)/(72-98) 142/77 mmHg (03/14 0518) SpO2:  [92 %-100 %] 92 % (03/14 0518)    Intake/Output from previous day: 03/13 0701 - 03/14 0700 In: 4560 [P.O.:60; I.V.:4500] Out: 1765 [Urine:1715; Blood:50] Intake/Output this shift:    Alert, nontoxic, looks uncomfortable. 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 approp TTP +SCDs, no edema  Lab Results:   Recent Labs  02/03/16 0424  WBC 7.8  HGB 11.7*  HCT 35.4*  PLT 178   BMET  Recent Labs  02/03/16 0424  NA 141  K 3.4*  CL 103  CO2 30  GLUCOSE 154*  BUN 9  CREATININE 1.02*  CALCIUM 8.6*   PT/INR No results for input(s): LABPROT, INR in the last 72 hours. ABG No results for input(s): PHART, HCO3 in the last 72 hours.  Invalid input(s): PCO2, PO2  Studies/Results: No results found.  Anti-infectives: Anti-infectives    Start     Dose/Rate Route Frequency Ordered Stop   02/01/16 1334  clindamycin (CLEOCIN) IVPB 900 mg     900 mg 100 mL/hr over 30 Minutes Intravenous 60 min pre-op 02/01/16 1335 02/02/16 1108   02/01/16 1334  gentamicin (GARAMYCIN) 420 mg in dextrose 5 % 100 mL IVPB     420 mg 110.5 mL/hr over 60 Minutes Intravenous 60 min pre-op 02/01/16 1335 02/02/16 1122      Assessment/Plan: s/p Procedure(s): LAPAROSCOPIC PARTIAL COlectomy right colon (N/A)  Start PCA for pain control Cont chemicalVTE proph Ambulate, OOB, IS Sips of clears Discussed intraop findings with pt  [name]. [name], MD, FACS General, Bariatric, & Minimally Invasive Surgery [facility], [place]   LOS: 1 day    Gayland Curry 02/03/2016

## 2016-02-04 LAB — BASIC METABOLIC PANEL
ANION GAP: 10 (ref 5–15)
BUN: 8 mg/dL (ref 6–20)
CHLORIDE: 104 mmol/L (ref 101–111)
CO2: 27 mmol/L (ref 22–32)
Calcium: 9 mg/dL (ref 8.9–10.3)
Creatinine, Ser: 1.15 mg/dL — ABNORMAL HIGH (ref 0.44–1.00)
GFR calc Af Amer: >60 mL/min (ref >60)
GFR, EST NON AFRICAN AMERICAN: 53 mL/min — AB (ref >60)
Glucose, Bld: 112 mg/dL — ABNORMAL HIGH (ref 65–99)
POTASSIUM: 3.7 mmol/L (ref 3.5–5.1)
SODIUM: 141 mmol/L (ref 135–145)

## 2016-02-04 LAB — CBC
HEMATOCRIT: 39.3 % (ref 36.0–46.0)
HEMOGLOBIN: 12.9 g/dL (ref 12.0–15.0)
MCH: 28.9 pg (ref 26.0–34.0)
MCHC: 32.8 g/dL (ref 30.0–36.0)
MCV: 87.9 fL (ref 78.0–100.0)
Platelets: 223 10*3/uL (ref 150–400)
RBC: 4.47 MIL/uL (ref 3.87–5.11)
RDW: 13.2 % (ref 11.5–15.5)
WBC: 9.3 10*3/uL (ref 4.0–10.5)

## 2016-02-04 LAB — MAGNESIUM: MAGNESIUM: 1.6 mg/dL — AB (ref 1.7–2.4)

## 2016-02-04 NOTE — Progress Notes (Signed)
2 Days Post-Op  Subjective: Didn't walk in halls. Walked in room, spent time in chair. Nausea better but still present. Had loose BM but having burping/belching. A little flatus. Pain better. Slept much better last night. Husband at [facility]  Objective: Vital signs in last 24 hours: Temp:  [98 F (36.7 C)-100.1 F (37.8 C)] 98 F (36.7 C) (03/15 0538) Pulse Rate:  [52-82] 52 (03/15 0538) Resp:  [12-22] 20 (03/15 0538) BP: (124-166)/(66-78) 134/66 mmHg (03/15 0538) SpO2:  [92 %-98 %] 92 % (03/15 0538)    Intake/Output from previous day: 03/14 0701 - 03/15 0700 In: 1107.1 [P.O.:130; I.V.:977.1] Out: 2900 [Urine:2900] Intake/Output this shift:    Looks much more comfortable today, not ill appearing cta Reg Obese, soft, less ttp today, wound vac intact. hypoBS No edema  Lab Results:   Recent Labs  02/03/16 0424 02/04/16 0440  WBC 7.8 9.3  HGB 11.7* 12.9  HCT 35.4* 39.3  PLT 178 223   BMET  Recent Labs  02/03/16 0424 02/04/16 0440  NA 141 141  K 3.4* 3.7  CL 103 104  CO2 30 27  GLUCOSE 154* 112*  BUN 9 8  CREATININE 1.02* 1.15*  CALCIUM 8.6* 9.0   PT/INR No results for input(s): LABPROT, INR in the last 72 hours. ABG No results for input(s): PHART, HCO3 in the last 72 hours.  Invalid input(s): PCO2, PO2  Studies/Results: No results found.  Anti-infectives: Anti-infectives    Start     Dose/Rate Route Frequency Ordered Stop   02/01/16 1334  clindamycin (CLEOCIN) IVPB 900 mg     900 mg 100 mL/hr over 30 Minutes Intravenous 60 min pre-op 02/01/16 1335 02/02/16 1108   02/01/16 1334  gentamicin (GARAMYCIN) 420 mg in dextrose 5 % 100 mL IVPB     420 mg 110.5 mL/hr over 60 Minutes Intravenous 60 min pre-op 02/01/16 1335 02/02/16 1122      Assessment/Plan: s/p Procedure(s): LAPAROSCOPIC PARTIAL COlectomy right colon (N/A)  Looks better today Labs ok Foley out Clear liquid diet as tolerated pulm toilet Discussed importance of ambulating in  halls!  [name]. [name], MD, FACS General, Bariatric, & Minimally Invasive Surgery [facility], [place]   LOS: 2 days    Gayland Curry 02/04/2016

## 2016-02-04 NOTE — Progress Notes (Signed)
Foley discontinued at 6:42am, pt due to void by 12:42p today 02/04/16

## 2016-02-04 NOTE — Clinical Documentation Improvement (Signed)
General Surgery  "INTRA-ABDOMINAL ABSCESS (K65.1)" documented at bottom of H&P. Abscess not further mentioned in OP Report or Progress Notes. Please clarify and document findings in next progress note so chart can be coded accurately. Coder/CDI cannot assume anything; must be documented. Thank you!   Intra-abdominal Abscess ruled out  Intra-abdominal Abscess ruled in  Other  Clinically Undetermined  Supporting Information:  Please exercise your independent, professional judgment when responding. A specific answer is not anticipated or expected.  Thank You, Zoila Shutter RN, BSN, [place] [phone]; Cell: [phone]

## 2016-02-05 MED ORDER — OXYCODONE HCL 5 MG PO TABS
5.0000 mg | ORAL_TABLET | ORAL | Status: DC | PRN
  Administered 2016-02-05 (×3): 5 mg via ORAL
  Administered 2016-02-06 (×2): 10 mg via ORAL
  Filled 2016-02-05: qty 2
  Filled 2016-02-05 (×2): qty 1
  Filled 2016-02-05: qty 2
  Filled 2016-02-05: qty 1

## 2016-02-05 MED ORDER — ACETAMINOPHEN 325 MG PO TABS: 650.0000 mg | ORAL_TABLET | Freq: Four times a day (QID) | ORAL | Status: DC | PRN

## 2016-02-05 NOTE — Progress Notes (Addendum)
3 Days Post-Op  Subjective: Tmax100. O/w feels better today than yesterday. No nausea. Lots of flatus. A few BMs. Didn't like taste of clears.   Objective: Vital signs in last 24 hours: Temp:  [98.2 F (36.8 C)-100 F (37.8 C)] 98.6 F (37 C) (03/16 0429) Pulse Rate:  [71-85] 85 (03/16 0429) Resp:  [10-24] 17 (03/16 0756) BP: (129-141)/(62-81) 132/77 mmHg (03/16 0429) SpO2:  [96 %-100 %] 100 % (03/16 0756) Last BM Date: 02/03/16  Intake/Output from previous day: 03/15 0701 - 03/16 0700 In: -  Out: 2550 [Urine:2550] Intake/Output this shift:    Alert, nontoxic cta b/l Reg Soft, obese, wound vac intact. approp min TTP No edema  Lab Results:   Recent Labs  02/03/16 0424 02/04/16 0440  WBC 7.8 9.3  HGB 11.7* 12.9  HCT 35.4* 39.3  PLT 178 223   BMET  Recent Labs  02/03/16 0424 02/04/16 0440  NA 141 141  K 3.4* 3.7  CL 103 104  CO2 30 27  GLUCOSE 154* 112*  BUN 9 8  CREATININE 1.02* 1.15*  CALCIUM 8.6* 9.0   PT/INR No results for input(s): LABPROT, INR in the last 72 hours. ABG No results for input(s): PHART, HCO3 in the last 72 hours.  Invalid input(s): PCO2, PO2  Studies/Results: No results found.  Anti-infectives: Anti-infectives    Start     Dose/Rate Route Frequency Ordered Stop   02/01/16 1334  clindamycin (CLEOCIN) IVPB 900 mg     900 mg 100 mL/hr over 30 Minutes Intravenous 60 min pre-op 02/01/16 1335 02/02/16 1108   02/01/16 1334  gentamicin (GARAMYCIN) 420 mg in dextrose 5 % 100 mL IVPB     420 mg 110.5 mL/hr over 60 Minutes Intravenous 60 min pre-op 02/01/16 1335 02/02/16 1122      Assessment/Plan: s/p Procedure(s): LAPAROSCOPIC PARTIAL COlectomy right colon (N/A)  Doing well.  Adv diet to fulls Decrease IVF Dc PCA, po pain meds prob home on Friday  No intra-abdominal abscess found during surgery.   [name]. [name], MD, FACS General, Bariatric, & Minimally Invasive Surgery [facility], [place]   LOS: 3 days     Casey [name] 02/05/2016

## 2016-02-05 NOTE — Progress Notes (Addendum)
Remaining PCA Fentanyl 160 mcg (47ml) wasted in sink. Waste witnessed by Windell Moulding.  Sol Passer, RN witnessed the waste of said medication previously written.

## 2016-02-06 DIAGNOSIS — D373 Neoplasm of uncertain behavior of appendix: Secondary | ICD-10-CM

## 2016-02-06 HISTORY — DX: Neoplasm of uncertain behavior of appendix: D37.3

## 2016-02-06 MED ORDER — OXYCODONE HCL 5 MG PO TABS: 5.0000 mg | ORAL_TABLET | ORAL | Status: DC | PRN

## 2016-02-06 NOTE — Discharge Summary (Signed)
Physician Discharge Summary  Casey [name] [id number] DOB: 1961-04-14 DOA: 02/02/2016  PCP: Chevis Pretty, FNP  Admit date: 02/02/2016 Discharge date: 02/06/2016  Recommendations for Outpatient Follow-up:   Follow-up Information    Follow up with Gayland Curry, MD. Schedule an appointment as soon as possible for a visit in 2 weeks.   Specialty:  General Surgery   Contact information:   [address] [place] [address] [phone]       Follow up with [facility]. Schedule an appointment as soon as possible for a visit in 7 days.   Specialty:  General Surgery   Why:  For suture removal   Contact information:   [place] [address] [phone]      Discharge Diagnoses:  Principal Problem:   Low grade mucinous neoplasm of appendix Active Problems:   Hypertension   Hyperlipidemia   Hypothyroidism Obesity  Surgical Procedure: Laparoscopic-assisted right hemicolectomy March 13th  Discharge Condition: Good Disposition: Home  Diet recommendation: Soft diet  Filed Weights   02/02/16 0925  Weight: 108.41 kg (239 lb)    History of present illness:  The patient is a 55 year old female who presents with appendicitis. She is referred by Chevis Pretty, FNP for a 2nd opinion regarding her recurrent RLQ abscess. Patient states that in September she had some right lower quadrant pain. They initially thought that it might be diverticulitis and so she went to the emergency department. She was found to have ruptured appendicitis. There was an abscess so she underwent percutaneous drain placement. She states that the drain placement was very painful at the end of the procedure. She states that the drain was in for about a week and that she was doing much better and the drain was removed. She did well for a while but had recurrent right lower quadrant pain in December. A repeat CT scan at that time demonstrated a  recurrent abscess measuring 5.6 x 4.6 cm. There is no evidence of contrast extravasation or contrast entering the collection. The appendix was not identified. The surrounding intestine looked unremarkable. She states that she followed up with Dr. Arnoldo Morale who recommended a repeat percutaneous drain however she declined because of the significant pain she had with insertion. They decided on 2 weeks of antibiotics and she felt better. However recently she states that she just hasn't felt great. She has been able to do her daily activities however she has occasional colicky episodes of right lower quadrant pain. She denies any fevers or chills. She reports a good appetite. She has daily bowel movements but they can range from loose to constipation which is unchanged for her. She has never had a colonoscopy. She states that she thinks all this might have started actually back in March of last year when she had severe right lower quadrant pain and it was thought to be a bladder infection and was given some antibiotics and everything got better. She states that Dr. Arnoldo Morale has discussed the high likelihood of needing an ileocecectomy in order to get her ultimately better. She just wanted a second opinion and that's why she came down here I recommended proceeding with ileocecectomy possible right hemicolectomy due to the chronicity of the fluid collection next to the cecum and its appearance of invading the cecum. We discussed the possibility that this may be a malignancy  Hospital Course:  She was brought in for a planned laparoscopic assisted partial colectomy on March 13.  She was given heparin and entereg preoperatively. Upon laparoscopic evaluation of her abdomen it became quite clear that this was not ruptured appendicitis. This is very concerning for either a cecal adenocarcinoma or a neoplasm of the appendix. There is no sign of disseminated disease in her abdominal cavity. There was a loop of her  mid ileum densely adhered to the cecum. At this point I proceeded with performing an oncologic procedure in detail laparoscopic right hemicolectomy including that portion of the small bowel densely adhered to the cecum. Postoperatively she did well. She was started on clear liquids on postoperative day 1. Her Foley was discontinued on postoperative day 2. She is continued on her home medications. She was continued on chemical DVT prophylaxis as well as entereg for bowel recovery. She began to have flatus and her diet was advanced. On postoperative day 4 she had had several bowel movements and was tolerating a soft diet. She was angling without difficulty and her pain was controlled. She had been instructed on wound VAC care. We placed a skin incision wound VAC at the time of surgery. She was given a copy of her pathology report. 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 We discussed that she would need oncologic consultation and perhaps a second opinion given that this is a rare abdominal malignancy. We discussed discharge instructions. She was deemed stable for discharge  BP 124/70 mmHg  Pulse 61  Temp(Src) 98.3 F (36.8 C) (Oral)  Resp 18  Ht 5' 10.5" (1.791 m)  Wt 108.41 kg (239 lb)  BMI 33.80 kg/m2  SpO2 98%  LMP 05/23/1995  Gen: alert, NAD, non-toxic appearing Pupils: equal, no scleral icterus Pulm: Lungs clear to auscultation, symmetric chest rise CV: regular rate and rhythm Abd: soft, minimal tenderness, nondistended. Wound vac intact and functioning. Ext: no edema, no calf tenderness Skin: no rash, no jaundice  Discharge Instructions  Discharge Instructions    Call MD for:  hives    Complete by:  As directed      Call MD for:  persistant dizziness or light-headedness    Complete by:  As directed      Call MD for:  persistant nausea and vomiting    Complete by:  As directed      Call MD for:  redness, tenderness, or signs of infection (pain,  swelling, redness, odor or green/yellow discharge around incision site)    Complete by:  As directed      Call MD for:  severe uncontrolled pain    Complete by:  As directed      Call MD for:    Complete by:  As directed   Temperature >101     Diet general    Complete by:  As directed   Soft diet     Discharge instructions    Complete by:  As directed   See [facility] discharge instructions     Increase activity slowly    Complete by:  As directed             Medication List    TAKE these medications        acetaminophen 500 MG tablet  Commonly known as:  TYLENOL  Take 1 tablet (500 mg total) by mouth every 4 (four) hours as needed for mild pain or moderate pain.     cetirizine 10 MG tablet  Commonly known as:  ZYRTEC  TAKE ONE (1) TABLET EACH DAY  escitalopram 10 MG tablet  Commonly known as:  LEXAPRO  TAKE ONE (1) TABLET EACH DAY     fenofibrate 160 MG tablet  TAKE ONE (1) TABLET EACH DAY     levothyroxine 75 MCG tablet  Commonly known as:  SYNTHROID, LEVOTHROID  TAKE ONE (1) TABLET EACH DAY     metoprolol succinate 50 MG 24 hr tablet  Commonly known as:  TOPROL-XL  TAKE ONE (1) TABLET EACH DAY     oxyCODONE 5 MG immediate release tablet  Commonly known as:  Oxy IR/ROXICODONE  Take 1-2 tablets (5-10 mg total) by mouth every 4 (four) hours as needed for moderate pain.     pantoprazole 40 MG tablet  Commonly known as:  PROTONIX  Take 1 tablet (40 mg total) by mouth as needed.     valsartan-hydrochlorothiazide 160-12.5 MG tablet  Commonly known as:  DIOVAN-HCT  TAKE ONE (1) TABLET EACH DAY           Follow-up Information    Follow up with Gayland Curry, MD. Schedule an appointment as soon as possible for a visit in 2 weeks.   Specialty:  General Surgery   Contact information:   [address] [place] [address] [phone]       Follow up with [facility]. Schedule an appointment as soon as possible for a visit in 7 days.    Specialty:  General Surgery   Why:  For suture removal   Contact information:   [place] [address] [phone]        The results of significant diagnostics from this hospitalization (including imaging, microbiology, ancillary and laboratory) are listed below for reference.    Significant Diagnostic Studies: No results found.  Microbiology: No results found for this or any previous visit (from the past 240 hour(s)).   Labs: Basic Metabolic Panel:  Recent Labs Lab 02/03/16 0424 02/04/16 0440  NA 141 141  K 3.4* 3.7  CL 103 104  CO2 30 27  GLUCOSE 154* 112*  BUN 9 8  CREATININE 1.02* 1.15*  CALCIUM 8.6* 9.0  MG 1.6* 1.6*  PHOS 2.7  --    Liver Function Tests: No results for input(s): AST, ALT, ALKPHOS, BILITOT, PROT, ALBUMIN in the last 168 hours. No results for input(s): LIPASE, AMYLASE in the last 168 hours. No results for input(s): AMMONIA in the last 168 hours. CBC:  Recent Labs Lab 02/03/16 0424 02/04/16 0440  WBC 7.8 9.3  HGB 11.7* 12.9  HCT 35.4* 39.3  MCV 86.3 87.9  PLT 178 223   Cardiac Enzymes: No results for input(s): CKTOTAL, CKMB, CKMBINDEX, TROPONINI in the last 168 hours. BNP: BNP (last 3 results) No results for input(s): BNP in the last 8760 hours.  ProBNP (last 3 results) No results for input(s): PROBNP in the last 8760 hours.  CBG: No results for input(s): GLUCAP in the last 168 hours.  Principal Problem:   Low grade mucinous neoplasm of appendix Active Problems:   Hypertension   Hyperlipidemia   Hypothyroidism   Time coordinating discharge: 15 minutes.   Signed:  Gayland Curry, MD [facility], [place] 02/06/2016, 4:46 PM

## 2016-02-06 NOTE — Progress Notes (Signed)
Discharge instructions given along with prescription.  Prevena patient guide provided to patient with contact number in case of problems.  Questions answered

## 2016-02-06 NOTE — Progress Notes (Signed)
Patient removed from hospital wound vac placed on Prevena home wound vac

## 2016-02-06 NOTE — Care Management Note (Signed)
Case Management Note  Patient Details  Name: Casey [name] MRN: [id number] Date of Birth: 1961/02/01  Subjective/Objective:     Admitted s/p Laparoscopic right colectomy.               Action/Plan: Discharge planning, no HH needs identified  Expected Discharge Date:  02/04/16               Expected Discharge Plan:  Home/Self Care  In-House Referral:  NA  Discharge planning Services  CM Consult  Post Acute Care Choice:  NA Choice offered to:  NA  DME Arranged:  N/A DME Agency:  NA  HH Arranged:  NA HH Agency:  NA  Status of Service:  Completed, signed off  Medicare Important Message Given:    Date Medicare IM Given:    Medicare IM give by:    Date Additional Medicare IM Given:    Additional Medicare Important Message give by:     If discussed at [place] of Stay Meetings, dates discussed:    Additional Comments:  Guadalupe Maple, RN 02/06/2016, 10:40 AM [phone]

## 2016-02-06 NOTE — Discharge Instructions (Signed)
[facility], [place] [phone]  OPEN ABDOMINAL SURGERY: POST OP INSTRUCTIONS  Always review your discharge instruction sheet given to you by the facility where your surgery was performed.  IF YOU HAVE DISABILITY OR FAMILY LEAVE FORMS, YOU MUST BRING THEM TO THE OFFICE FOR PROCESSING.  PLEASE DO NOT GIVE THEM TO YOUR DOCTOR.  1. A prescription for pain medication may be given to you upon discharge.  Take your pain medication as prescribed, if needed.  If narcotic pain medicine is not needed, then you may take acetaminophen (Tylenol) or ibuprofen (Advil) as needed. 2. Take your usually prescribed medications unless otherwise directed. 3. If you need a refill on your pain medication, please contact your pharmacy. They will contact our office to request authorization.  Prescriptions will not be filled after 5pm or on week-ends. 4. You should follow a light diet the first few days after arrival home, such as soup and crackers, pudding, etc.unless your doctor has advised otherwise. A high-fiber, low fat diet can be resumed as tolerated.   Be sure to include lots of fluids daily.  5. Most patients will experience some swelling and bruising in the area of the incision. Ice pack will help. Swelling and bruising can take several days to resolve..  6. It is common to experience some constipation if taking pain medication after surgery.  Increasing fluid intake and taking a stool softener will usually help or prevent this problem from occurring.  A mild laxative (Milk of Magnesia or Miralax) should be taken according to package directions if there are no bowel movements after 48 hours. 7.  You may have steri-strips (small skin tapes) in place directly over the incision.  These strips should be left on the skin for 7-10 days.  If your surgeon used skin glue on the incision, you may shower in 24 hours.  The glue will flake off over the next 2-3 weeks.  Any sutures or staples will be removed at  the office during your follow-up visit. You may find that a light gauze bandage over your incision may keep your staples from being rubbed or pulled. You may shower and replace the bandage daily. 8. ACTIVITIES:  You may resume regular (light) daily activities beginning the next day--such as daily self-care, walking, climbing stairs--gradually increasing activities as tolerated.  You may have sexual intercourse when it is comfortable.  Refrain from any heavy lifting or straining until approved by your doctor. a. You may drive when you no longer are taking prescription pain medication, you can comfortably wear a seatbelt, and you can safely maneuver your car and apply brakes b. Return to Work: ___________________________________ 80. You should see your doctor in the office for a follow-up appointment approximately two weeks after your surgery.  Make sure that you call for this appointment within a day or two after you arrive home to insure a convenient appointment time. OTHER INSTRUCTIONS:  Follow wound vac instructions  WHEN TO CALL YOUR DOCTOR: 1. Fever over 101.0 2. Inability to urinate 3. Nausea and/or vomiting 4. Extreme swelling or bruising 5. Continued bleeding from incision. 6. Increased pain, redness, or drainage from the incision. 7. Difficulty swallowing or breathing 8. Muscle cramping or spasms. 9. Numbness or tingling in hands or feet or around lips.  The clinic staff is available to answer your questions during regular business hours.  Please dont hesitate to call and ask to speak to one of the nurses if you have concerns.  For further questions, please visit [email or web address]

## 2016-02-09 ENCOUNTER — Other Ambulatory Visit: Payer: Self-pay | Admitting: *Deleted

## 2016-02-09 DIAGNOSIS — D373 Neoplasm of uncertain behavior of appendix: Secondary | ICD-10-CM

## 2016-02-09 NOTE — Progress Notes (Signed)
Received referral for medical oncology on patient from Dr. [name]. Live in [place]. Forwarded referral to [facility] and left VM for nurse navigator, Lupita Raider.

## 2016-02-12 ENCOUNTER — Encounter (HOSPITAL_COMMUNITY): Payer: Self-pay | Admitting: Hematology & Oncology

## 2016-02-12 ENCOUNTER — Encounter (HOSPITAL_COMMUNITY): Payer: Commercial Managed Care - HMO | Attending: Hematology & Oncology | Admitting: Hematology & Oncology

## 2016-02-12 VITALS — BP 125/75 | HR 101 | Temp 97.8°F | Resp 20 | Ht 70.0 in

## 2016-02-12 DIAGNOSIS — N83202 Unspecified ovarian cyst, left side: Secondary | ICD-10-CM

## 2016-02-12 DIAGNOSIS — N83201 Unspecified ovarian cyst, right side: Secondary | ICD-10-CM

## 2016-02-12 DIAGNOSIS — D490 Neoplasm of unspecified behavior of digestive system: Secondary | ICD-10-CM | POA: Diagnosis not present

## 2016-02-12 DIAGNOSIS — E785 Hyperlipidemia, unspecified: Secondary | ICD-10-CM | POA: Insufficient documentation

## 2016-02-12 DIAGNOSIS — I10 Essential (primary) hypertension: Secondary | ICD-10-CM | POA: Insufficient documentation

## 2016-02-12 DIAGNOSIS — Z801 Family history of malignant neoplasm of trachea, bronchus and lung: Secondary | ICD-10-CM | POA: Insufficient documentation

## 2016-02-12 DIAGNOSIS — E0500 Thyrotoxicosis with diffuse goiter without thyrotoxic crisis or storm: Secondary | ICD-10-CM | POA: Diagnosis not present

## 2016-02-12 DIAGNOSIS — K219 Gastro-esophageal reflux disease without esophagitis: Secondary | ICD-10-CM | POA: Diagnosis not present

## 2016-02-12 DIAGNOSIS — Z79899 Other long term (current) drug therapy: Secondary | ICD-10-CM | POA: Diagnosis not present

## 2016-02-12 DIAGNOSIS — F419 Anxiety disorder, unspecified: Secondary | ICD-10-CM | POA: Insufficient documentation

## 2016-02-12 DIAGNOSIS — F329 Major depressive disorder, single episode, unspecified: Secondary | ICD-10-CM | POA: Diagnosis not present

## 2016-02-12 DIAGNOSIS — Z9889 Other specified postprocedural states: Secondary | ICD-10-CM | POA: Diagnosis not present

## 2016-02-12 DIAGNOSIS — C181 Malignant neoplasm of appendix: Secondary | ICD-10-CM | POA: Insufficient documentation

## 2016-02-12 DIAGNOSIS — C189 Malignant neoplasm of colon, unspecified: Secondary | ICD-10-CM | POA: Insufficient documentation

## 2016-02-12 DIAGNOSIS — D373 Neoplasm of uncertain behavior of appendix: Secondary | ICD-10-CM

## 2016-02-12 LAB — COMPREHENSIVE METABOLIC PANEL
ALK PHOS: 67 U/L (ref 38–126)
ALT: 71 U/L — AB (ref 14–54)
AST: 60 U/L — ABNORMAL HIGH (ref 15–41)
Albumin: 4.5 g/dL (ref 3.5–5.0)
Anion gap: 11 (ref 5–15)
BILIRUBIN TOTAL: 0.5 mg/dL (ref 0.3–1.2)
BUN: 18 mg/dL (ref 6–20)
CALCIUM: 9.5 mg/dL (ref 8.9–10.3)
CHLORIDE: 103 mmol/L (ref 101–111)
CO2: 23 mmol/L (ref 22–32)
CREATININE: 0.98 mg/dL (ref 0.44–1.00)
Glucose, Bld: 93 mg/dL (ref 65–99)
Potassium: 3.3 mmol/L — ABNORMAL LOW (ref 3.5–5.1)
Sodium: 137 mmol/L (ref 135–145)
TOTAL PROTEIN: 8.3 g/dL — AB (ref 6.5–8.1)

## 2016-02-12 LAB — VITAMIN B12: VITAMIN B 12: 342 pg/mL (ref 180–914)

## 2016-02-12 LAB — FERRITIN: Ferritin: 218 ng/mL (ref 11–307)

## 2016-02-12 LAB — IRON AND TIBC
IRON: 51 ug/dL (ref 28–170)
Saturation Ratios: 12 % (ref 10.4–31.8)
TIBC: 417 ug/dL (ref 250–450)
UIBC: 366 ug/dL

## 2016-02-12 LAB — FOLATE: Folate: 11.1 ng/mL (ref >5.9)

## 2016-02-12 NOTE — Patient Instructions (Addendum)
[facility] Discharge Instructions  RECOMMENDATIONS MADE BY THE CONSULTANT AND ANY TEST RESULTS WILL BE SENT TO YOUR REFERRING PHYSICIAN.  Today we drew a CEA level (tumor marker), CA 125 (tumor marker), CMP, folate, ferritin, and B12 level, iron/tibc.  FMLA papers for [employer] filled out and returned to you.   Hildred Alamin is the Nurse Navigator [phone]. Call with any questions, concerns, problems.   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 - MARGINS NOT INVOLVED. 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  Review of Pathology Report:   We don't care about tumor size but yours was 6cm. We worry about how far it goes through the bowel wall. You have a low grade tumor. It was well differentiated - means the tumor resembled the appendix - this is a good thing. The problem with this is that it is appendiceal carcinoma. It didn't get into the neurovascular bundles or lymphvascular system. No tumor deposits outside of the bowel.  You abscessed around the tumor.    Recommendations for You:  You had a perforated tumor/tumor rupture, mucinous histology, and you are young. So below are the recommendations.  Referral to Dr. Blanchie Serve, surgical oncologist, @ [facility] for consultation regarding need for a abdominal surgical procedure. If there is no role for this procedure, then we will proceed with chemo and not do the procedure.   We treat this type of cancer like a colon cancer.   We use the drugs Oxaliplatin, Leucovorin, 5FU  (FOLFOX). These drugs are given once every 14 days x 12 treatments. This will take 6 months to complete. You will need a port a cath placed prior to chemotherapy.  You will probably need your right ovary out.  CT of chest to be ordered also.   Return to Dr. Whitney Muse on the  Monday following the surgical visit on Thursday.     Thank you for choosing [facility] to provide your oncology and hematology care.  To afford each patient quality time with our provider, please arrive at least 15 minutes before your scheduled appointment time.   Beginning January 23rd 2017 lab work for the [facility] will be done in the  Main lab at [facility] on 1st floor. If you have a lab appointment with the [facility] please come in thru the  Main Entrance and check in at the main information desk  You need to re-schedule your appointment should you arrive 10 or more minutes late.  We strive to give you quality time with our providers, and arriving late affects you and other patients whose appointments are after yours.  Also, if you no show three or more times for appointments you may be dismissed from the clinic at the providers discretion.     Again, thank you for choosing [facility].  Our hope is that these requests will decrease the amount of time that you wait before being seen by our physicians.       _____________________________________________________________  Should you have questions after your visit to [facility], please contact our office at [phone] between the hours of 8:30 a.m. and 4:30 p.m.  Voicemails left after 4:30 p.m. will not be returned until the following business day.  For prescription refill requests, have your pharmacy contact our  office.         Resources For Cancer Patients and their Caregivers ? American Cancer Society: Can assist with transportation, wigs, general needs, runs Look Good Feel Better.        (802)066-4641 ? Cancer Care: Provides financial assistance, online support groups, medication/co-pay assistance.  1-800-813-HOPE 701-783-5752) ? [facility] Assists [place] cancer patients and their families through emotional , educational and  financial support.  609-222-8827 ? [place] DSS Where to apply for food stamps, Medicaid and utility assistance. 551-250-4085 ? [employer]: Transportation to medical appointments. 817-057-1692 ? Social Security Administration: May apply for disability if have a Stage IV cancer. ([phone] 093-6684 262-372-1347 ? [employer], Disability and Transit Services: Assists with nutrition, care and transit needs. (918)249-9178  Oxaliplatin Injection What is this medicine? OXALIPLATIN (ox AL i PLA tin) is a chemotherapy drug. It targets fast dividing cells, like cancer cells, and causes these cells to die. This medicine is used to treat cancers of the colon and rectum, and many other cancers. This medicine may be used for other purposes; ask your health care provider or pharmacist if you have questions. What should I tell my health care provider before I take this medicine? They need to know if you have any of these conditions: -kidney disease -an unusual or allergic reaction to oxaliplatin, other chemotherapy, other medicines, foods, dyes, or preservatives -pregnant or trying to get pregnant -breast-feeding How should I use this medicine? This drug is given as an infusion into a vein. It is administered in a hospital or clinic by a specially trained health care professional. Talk to your pediatrician regarding the use of this medicine in children. Special care may be needed. Overdosage: If you think you have taken too much of this medicine contact a poison control center or emergency room at once. NOTE: This medicine is only for you. Do not share this medicine with others. What if I miss a dose? It is important not to miss a dose. Call your doctor or health care professional if you are unable to keep an appointment. What may interact with this medicine? -medicines to increase blood counts like filgrastim, pegfilgrastim, sargramostim -probenecid -some antibiotics like amikacin, gentamicin,  neomycin, polymyxin B, streptomycin, tobramycin -zalcitabine Talk to your doctor or health care professional before taking any of these medicines: -acetaminophen -aspirin -ibuprofen -ketoprofen -naproxen This list may not describe all possible interactions. Give your health care provider a list of all the medicines, herbs, non-prescription drugs, or dietary supplements you use. Also tell them if you smoke, drink alcohol, or use illegal drugs. Some items may interact with your medicine. What should I watch for while using this medicine? Your condition will be monitored carefully while you are receiving this medicine. You will need important blood work done while you are taking this medicine. This medicine can make you more sensitive to cold. Do not drink cold drinks or use ice. Cover exposed skin before coming in contact with cold temperatures or cold objects. When out in cold weather wear warm clothing and cover your mouth and nose to warm the air that goes into your lungs. Tell your doctor if you get sensitive to the cold. This drug may make you feel generally unwell. This is not uncommon, as chemotherapy can affect healthy cells as well as cancer cells. Report any side effects. Continue your course of treatment even though you feel ill unless your doctor tells you to stop. In some cases, you may be given  additional medicines to help with side effects. Follow all directions for their use. Call your doctor or health care professional for advice if you get a fever, chills or sore throat, or other symptoms of a cold or flu. Do not treat yourself. This drug decreases your body's ability to fight infections. Try to avoid being around people who are sick. This medicine may increase your risk to bruise or bleed. Call your doctor or health care professional if you notice any unusual bleeding. Be careful brushing and flossing your teeth or using a toothpick because you may get an infection or bleed more  easily. If you have any dental work done, tell your dentist you are receiving this medicine. Avoid taking products that contain aspirin, acetaminophen, ibuprofen, naproxen, or ketoprofen unless instructed by your doctor. These medicines may hide a fever. Do not become pregnant while taking this medicine. Women should inform their doctor if they wish to become pregnant or think they might be pregnant. There is a potential for serious side effects to an unborn child. Talk to your health care professional or pharmacist for more information. Do not breast-feed an infant while taking this medicine. Call your doctor or health care professional if you get diarrhea. Do not treat yourself. What side effects may I notice from receiving this medicine? Side effects that you should report to your doctor or health care professional as soon as possible: -allergic reactions like skin rash, itching or hives, swelling of the face, lips, or tongue -low blood counts - This drug may decrease the number of white blood cells, red blood cells and platelets. You may be at increased risk for infections and bleeding. -signs of infection - fever or chills, cough, sore throat, pain or difficulty passing urine -signs of decreased platelets or bleeding - bruising, pinpoint red spots on the skin, black, tarry stools, nosebleeds -signs of decreased red blood cells - unusually weak or tired, fainting spells, lightheadedness -breathing problems -chest pain, pressure -cough -diarrhea -jaw tightness -mouth sores -nausea and vomiting -pain, swelling, redness or irritation at the injection site -pain, tingling, numbness in the hands or feet -problems with balance, talking, walking -redness, blistering, peeling or loosening of the skin, including inside the mouth -trouble passing urine or change in the amount of urine Side effects that usually do not require medical attention (report to your doctor or health care professional if  they continue or are bothersome): -changes in vision -constipation -hair loss -loss of appetite -metallic taste in the mouth or changes in taste -stomach pain This list may not describe all possible side effects. Call your doctor for medical advice about side effects. You may report side effects to FDA at 1-800-FDA-1088. Where should I keep my medicine? This drug is given in a hospital or clinic and will not be stored at home. NOTE: This sheet is a summary. It may not cover all possible information. If you have questions about this medicine, talk to your doctor, pharmacist, or health care provider.    2016, Elsevier/Gold Standard. (2008-06-04 17:22:47) Leucovorin injection What is this medicine? LEUCOVORIN (loo koe VOR in) is used to prevent or treat the harmful effects of some medicines. This medicine is used to treat anemia caused by a low amount of folic acid in the body. It is also used with 5-fluorouracil (5-FU) to treat colon cancer. This medicine may be used for other purposes; ask your health care provider or pharmacist if you have questions. What should I tell my health care provider  before I take this medicine? They need to know if you have any of these conditions: -anemia from low levels of vitamin B-12 in the blood -an unusual or allergic reaction to leucovorin, folic acid, other medicines, foods, dyes, or preservatives -pregnant or trying to get pregnant -breast-feeding How should I use this medicine? This medicine is for injection into a muscle or into a vein. It is given by a health care professional in a hospital or clinic setting. Talk to your pediatrician regarding the use of this medicine in children. Special care may be needed. Overdosage: If you think you have taken too much of this medicine contact a poison control center or emergency room at once. NOTE: This medicine is only for you. Do not share this medicine with others. What if I miss a dose? This does not  apply. What may interact with this medicine? -capecitabine -fluorouracil -phenobarbital -phenytoin -primidone -trimethoprim-sulfamethoxazole This list may not describe all possible interactions. Give your health care provider a list of all the medicines, herbs, non-prescription drugs, or dietary supplements you use. Also tell them if you smoke, drink alcohol, or use illegal drugs. Some items may interact with your medicine. What should I watch for while using this medicine? Your condition will be monitored carefully while you are receiving this medicine. This medicine may increase the side effects of 5-fluorouracil, 5-FU. Tell your doctor or health care professional if you have diarrhea or mouth sores that do not get better or that get worse. What side effects may I notice from receiving this medicine? Side effects that you should report to your doctor or health care professional as soon as possible: -allergic reactions like skin rash, itching or hives, swelling of the face, lips, or tongue -breathing problems -fever, infection -mouth sores -unusual bleeding or bruising -unusually weak or tired Side effects that usually do not require medical attention (report to your doctor or health care professional if they continue or are bothersome): -constipation or diarrhea -loss of appetite -nausea, vomiting This list may not describe all possible side effects. Call your doctor for medical advice about side effects. You may report side effects to FDA at 1-800-FDA-1088. Where should I keep my medicine? This drug is given in a hospital or clinic and will not be stored at home. NOTE: This sheet is a summary. It may not cover all possible information. If you have questions about this medicine, talk to your doctor, pharmacist, or health care provider.    2016, Elsevier/Gold Standard. (2008-05-14 16:50:29) Fluorouracil, 5-FU injection What is this medicine? FLUOROURACIL, 5-FU (flure oh YOOR a sil)  is a chemotherapy drug. It slows the growth of cancer cells. This medicine is used to treat many types of cancer like breast cancer, colon or rectal cancer, pancreatic cancer, and stomach cancer. This medicine may be used for other purposes; ask your health care provider or pharmacist if you have questions. What should I tell my health care provider before I take this medicine? They need to know if you have any of these conditions: -blood disorders -dihydropyrimidine dehydrogenase (DPD) deficiency -infection (especially a virus infection such as chickenpox, cold sores, or herpes) -kidney disease -liver disease -malnourished, poor nutrition -recent or ongoing radiation therapy -an unusual or allergic reaction to fluorouracil, other chemotherapy, other medicines, foods, dyes, or preservatives -pregnant or trying to get pregnant -breast-feeding How should I use this medicine? This drug is given as an infusion or injection into a vein. It is administered in a hospital or clinic by a  specially trained health care professional. Talk to your pediatrician regarding the use of this medicine in children. Special care may be needed. Overdosage: If you think you have taken too much of this medicine contact a poison control center or emergency room at once. NOTE: This medicine is only for you. Do not share this medicine with others. What if I miss a dose? It is important not to miss your dose. Call your doctor or health care professional if you are unable to keep an appointment. What may interact with this medicine? -allopurinol -cimetidine -dapsone -digoxin -hydroxyurea -leucovorin -levamisole -medicines for seizures like ethotoin, fosphenytoin, phenytoin -medicines to increase blood counts like filgrastim, pegfilgrastim, sargramostim -medicines that treat or prevent blood clots like warfarin, enoxaparin, and dalteparin -methotrexate -metronidazole -pyrimethamine -some other chemotherapy drugs  like busulfan, cisplatin, estramustine, vinblastine -trimethoprim -trimetrexate -vaccines Talk to your doctor or health care professional before taking any of these medicines: -acetaminophen -aspirin -ibuprofen -ketoprofen -naproxen This list may not describe all possible interactions. Give your health care provider a list of all the medicines, herbs, non-prescription drugs, or dietary supplements you use. Also tell them if you smoke, drink alcohol, or use illegal drugs. Some items may interact with your medicine. What should I watch for while using this medicine? Visit your doctor for checks on your progress. This drug may make you feel generally unwell. This is not uncommon, as chemotherapy can affect healthy cells as well as cancer cells. Report any side effects. Continue your course of treatment even though you feel ill unless your doctor tells you to stop. In some cases, you may be given additional medicines to help with side effects. Follow all directions for their use. Call your doctor or health care professional for advice if you get a fever, chills or sore throat, or other symptoms of a cold or flu. Do not treat yourself. This drug decreases your body's ability to fight infections. Try to avoid being around people who are sick. This medicine may increase your risk to bruise or bleed. Call your doctor or health care professional if you notice any unusual bleeding. Be careful brushing and flossing your teeth or using a toothpick because you may get an infection or bleed more easily. If you have any dental work done, tell your dentist you are receiving this medicine. Avoid taking products that contain aspirin, acetaminophen, ibuprofen, naproxen, or ketoprofen unless instructed by your doctor. These medicines may hide a fever. Do not become pregnant while taking this medicine. Women should inform their doctor if they wish to become pregnant or think they might be pregnant. There is a potential  for serious side effects to an unborn child. Talk to your health care professional or pharmacist for more information. Do not breast-feed an infant while taking this medicine. Men should inform their doctor if they wish to father a child. This medicine may lower sperm counts. Do not treat diarrhea with over the counter products. Contact your doctor if you have diarrhea that lasts more than 2 days or if it is severe and watery. This medicine can make you more sensitive to the sun. Keep out of the sun. If you cannot avoid being in the sun, wear protective clothing and use sunscreen. Do not use sun lamps or tanning beds/booths. What side effects may I notice from receiving this medicine? Side effects that you should report to your doctor or health care professional as soon as possible: -allergic reactions like skin rash, itching or hives, swelling of the face,  lips, or tongue -low blood counts - this medicine may decrease the number of white blood cells, red blood cells and platelets. You may be at increased risk for infections and bleeding. -signs of infection - fever or chills, cough, sore throat, pain or difficulty passing urine -signs of decreased platelets or bleeding - bruising, pinpoint red spots on the skin, black, tarry stools, blood in the urine -signs of decreased red blood cells - unusually weak or tired, fainting spells, lightheadedness -breathing problems -changes in vision -chest pain -mouth sores -nausea and vomiting -pain, swelling, redness at site where injected -pain, tingling, numbness in the hands or feet -redness, swelling, or sores on hands or feet -stomach pain -unusual bleeding Side effects that usually do not require medical attention (report to your doctor or health care professional if they continue or are bothersome): -changes in finger or toe nails -diarrhea -dry or itchy skin -hair loss -headache -loss of appetite -sensitivity of eyes to the light -stomach  upset -unusually teary eyes This list may not describe all possible side effects. Call your doctor for medical advice about side effects. You may report side effects to FDA at 1-800-FDA-1088. Where should I keep my medicine? This drug is given in a hospital or clinic and will not be stored at home. NOTE: This sheet is a summary. It may not cover all possible information. If you have questions about this medicine, talk to your doctor, pharmacist, or health care provider.    2016, Elsevier/Gold Standard. (2008-03-13 13:53:16) Implanted Port Insertion An implanted port is a central line that has a round shape and is placed under the skin. It is used as a long-term IV access for:   Medicines, such as chemotherapy.   Fluids.   Liquid nutrition, such as total parenteral nutrition (TPN).   Blood samples.  LET [facility] CARE PROVIDER KNOW ABOUT:  Allergies to food or medicine.   Medicines taken, including vitamins, herbs, eye drops, creams, and over-the-counter medicines.   Any allergies to heparin.  Use of steroids (by mouth or creams).   Previous problems with anesthetics or numbing medicines.   History of bleeding problems or blood clots.   Previous surgery.   Other health problems, including diabetes and kidney problems.   Possibility of pregnancy, if this applies. RISKS AND COMPLICATIONS Generally, this is a safe procedure. However, as with any procedure, problems can occur. Possible problems include:  Damage to the blood vessel, bruising, or bleeding at the puncture site.   Infection.  Blood clot in the vessel that the port is in.  Breakdown of the skin over your port.  Very rarely a person may develop a condition called a pneumothorax, a collection of air in the chest that may cause one of the lungs to collapse. The placement of these catheters with the appropriate imaging guidance significantly decreases the risk of a pneumothorax.  BEFORE THE  PROCEDURE   Your health care provider may want you to have blood tests. These tests can help tell how well your kidneys and liver are working. They can also show how well your blood clots.   If you take blood thinners (anticoagulant medicines), ask your health care provider when you should stop taking them.   Make arrangements for someone to drive you home. This is necessary if you have been sedated for your procedure.  PROCEDURE  Port insertion usually takes about 30-45 minutes.   An IV needle will be inserted in your arm. Medicine for pain and medicine  to help relax you (sedative) will flow directly into your body through this needle.   You will lie on an exam table, and you will be connected to monitors to keep track of your heart rate, blood pressure, and breathing throughout the procedure.  An oxygen monitoring device may be attached to your finger. Oxygen will be given.   Everything will be kept as germ free (sterile) as possible during the procedure. The skin near the point of the incision will be cleansed with antiseptic, and the area will be draped with sterile towels. The skin and deeper tissues over the port area will be made numb with a local anesthetic.  Two small cuts (incisions) will be made in the skin to insert the port. One will be made in the neck to obtain access to the vein where the catheter will lie.   Because the port reservoir will be placed under the skin, a small skin incision will be made in the upper chest, and a small pocket for the port will be made under the skin. The catheter that will be connected to the port tunnels to a large central vein in the chest. A small, raised area will remain on your body at the site of the reservoir when the procedure is complete.  The port placement will be done under imaging guidance to ensure the proper placement.  The reservoir has a silicone covering that can be punctured with a special needle.   The port will be  flushed with normal saline, and blood will be drawn to make sure it is working properly.  There will be nothing remaining outside the skin when the procedure is finished.   Incisions will be held together by stitches, surgical glue, or a special tape. AFTER THE PROCEDURE  You will stay in a recovery area until the anesthesia has worn off. Your blood pressure and pulse will be checked.  A final chest X-ray will be taken to check the placement of the port and to ensure that there is no injury to your lung.   This information is not intended to replace advice given to you by your health care provider. Make sure you discuss any questions you have with your health care provider.   Document Released: 08/29/2013 Document Revised: 11/29/2014 Document Reviewed: 08/29/2013 Elsevier Interactive Patient Education [employer].

## 2016-02-12 NOTE — Progress Notes (Signed)
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 [name] No prior C-scope  HISTORY OF PRESENTING ILLNESS:  Casey [name] 55 y.o. 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   Casey [name] was here with her husband Casey [name] today.  She went to her PCP for abdominal pain in the fall of last year. She thought it was because of a bladder infection. Her PCP told her it is was due to constipation. She had an X-ray done and it showed a lot of stool. She took a laxative. She was put on antibiotics and was better until September.  She was admitted on 08/08/2015 for what was diagnosed as ruptured appendicitis/abscess. She was treated conservatively with IV antibiotics, drain insertion by IR.   She notes that she initially improved but over time her symptoms returned. She was put on antibiotics before Christmas and it felt better because of this. Her husband said "she wasn't well but she felt better"  She had a CT scan done on 10/29/15 that showed continued fluid collection near the base of the cecum concerning for recurrent/residual abscess, slightly smaller than prior study.  She eventually sought a second opinion in [place] and saw Dr. [name]. She underwent a laparoscopic assisted R hemicolectomy on 01/21/2016.  She notes that Dr. [name] was "suprised" by her pathology. Operative report: I identified her cecum and it appeared enlarged. Appeared to be a mass involving the anterolateral wall of the cecum. It was hard and thickened, this was concerning more for potential malignancy than a straightforward ruptured appendicitis. She had one adhesive band to the anterior abdominal wall, which was taken down with  Harmonic scalpel. there appeared to be a segment of small bowel tethered to the medial aspect of the cecum. It was intimately attached, this was not the terminal ileum. It was approximately 30 cm from the terminal ileum. At this point, I decided that we would need to do a formal right colectomy because of the concern for potential malignancy and that ileocecectomy would not be enough. Moreover, I felt that we would probably to take the attached small bowel to the cecum en bloc due to its intimate association with the cecum.  She stated that she has not felt good in a year and a half.   She is still having constant diarrhea. She is taking Imodium for this. She said she did not sleep last night because she was in the bathroom all night. She took 3 Imodium today for her diarrhea she said that she does not feel bloated for this.   She has not had a colonoscopy. She was in the process of setting one up but then her pain started. She does get routine mammograms.  She is here today for additional recommendations regarding her appendiceal carcinoma.   MEDICAL HISTORY:  Past Medical History  Diagnosis Date  . DUB (dysfunctional uterine bleeding)   . Palpitations   . Hypertension   . Hyperlipidemia   . Depression   . GERD (gastroesophageal reflux disease)   . Migraines   . Pulmonary nodule   . Dysrhythmia     pt states has PVCs   . Pneumonia     hx of had approx 22 years ago   . Graves disease   .  Anxiety   . History of urinary tract infection     last one approx 4 years ago   . Cancer ([place])     premelanoma per hip   . History of blood transfusion   . Anemia   . PONV (postoperative nausea and vomiting)     SURGICAL HISTORY: Past Surgical History  Procedure Laterality Date  . Vesicovaginal fistula closure w/ tah      Dr. Margaretha Glassing  2 degree DUB   . Tonsillectomy    . Vaginal hysterectomy    . Dilation and curettage of uterus    . Infertility testing     . Appendectomy      . Diagnostic laparoscopy    . Laparoscopic partial colectomy N/A 02/02/2016    Procedure: LAPAROSCOPIC PARTIAL COlectomy right colon;  Surgeon: Greer Pickerel, MD;  Location: [facility];  Service: General;  Laterality: N/A;    SOCIAL HISTORY: Social History   Social History  . Marital Status: Married    Spouse Name: N/A  . Number of Children: N/A  . Years of Education: N/A   Occupational History  . Not on file.   Social History Main Topics  . Smoking status: Never Smoker   . Smokeless tobacco: Never Used  . Alcohol Use: No  . Drug Use: No  . Sexual Activity:    Partners: Male    Birth Control/ Protection: Surgical     Comment: [facility]   Other Topics Concern  . 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 Hypertension Mother   . Irregular heart beat Mother   . Thyroid disease Maternal Grandmother   . Cancer Maternal Grandmother     lung  . Cancer Paternal Grandfather     lung    ALLERGIES:  is allergic to labetalol; morphine and related; and zosyn.  MEDICATIONS:  Current Outpatient Prescriptions  Medication Sig Dispense Refill  . acetaminophen (TYLENOL) 500 MG tablet Take 1 tablet (500 mg total) by mouth every 4 (four) hours as needed for mild pain or moderate pain.    . cetirizine (ZYRTEC) 10 MG tablet TAKE ONE (1) TABLET EACH DAY 30 tablet 9  . escitalopram (LEXAPRO) 10 MG tablet TAKE ONE (1) TABLET EACH DAY 30 tablet 5  . fenofibrate 160 MG tablet TAKE ONE (1) TABLET EACH DAY 30 tablet 1  . levothyroxine (SYNTHROID, LEVOTHROID) 75 MCG tablet TAKE ONE (1) TABLET EACH DAY 30 tablet 1   . metoprolol succinate (TOPROL-XL) 50 MG 24 hr tablet TAKE ONE (1) TABLET EACH DAY 30 tablet 1  . nystatin (MYCOSTATIN) 100000 UNIT/ML suspension Take 5 mLs by mouth 4 (four) times daily.    . ondansetron (ZOFRAN) 8 MG tablet Take 8 mg by mouth every 8 (eight) hours as needed for nausea or vomiting.    Marland Kitchen oxyCODONE (OXY IR/ROXICODONE) 5 MG immediate release tablet Take 1-2 tablets (5-10 mg total) by mouth every 4 (four) hours as needed for moderate pain. 40 tablet 0  . pantoprazole (PROTONIX) 40 MG tablet Take 1 tablet (40 mg total) by mouth as  needed. (Patient taking differently: Take 40 mg by mouth daily as needed (for GERD). ) 30 tablet 5  . valsartan-hydrochlorothiazide (DIOVAN-HCT) 160-12.5 MG tablet TAKE ONE (1) TABLET EACH DAY 30 tablet 1   No current facility-administered medications for this visit.    Review of Systems  Constitutional: Positive for malaise/fatigue.       Has not felt good in a year and a half. No appetite  HENT: Negative.   Eyes: Negative.   Respiratory: Negative.  Negative for shortness of breath.   Cardiovascular: Negative.  Negative for chest pain.  Gastrointestinal: Positive for diarrhea.       Diarrhea handled with Immodium (took 3 today) No bloating  Genitourinary: Negative.   Musculoskeletal: Negative.   Skin: Negative.   Neurological: Negative.   Endo/Heme/Allergies: Negative.   Psychiatric/Behavioral: Negative.   All other systems reviewed and are negative.  14 point ROS was done and is otherwise as detailed above or in HPI   PHYSICAL EXAMINATION: ECOG PERFORMANCE STATUS: 1 - Symptomatic but completely ambulatory  Filed Vitals:   02/12/16 1400 02/12/16 1441  BP: 116/74 125/75  Pulse: 81 101  Temp: 97.8 F (36.6 C)   Resp: 20    Filed Weights    Physical Exam  Constitutional: She is oriented to person, place, and time and well-developed, well-nourished, and in no distress.  HENT:  Head: Normocephalic and atraumatic.  Nose: Nose  normal.  Mouth/Throat: Oropharynx is clear and moist. No oropharyngeal exudate.  Eyes: Conjunctivae and EOM are normal. Pupils are equal, round, and reactive to light. Right eye exhibits no discharge. Left eye exhibits no discharge. No scleral icterus.  Neck: Normal range of motion. Neck supple. No tracheal deviation present. No thyromegaly present.  Cardiovascular: Normal rate, regular rhythm and normal heart sounds.  Exam reveals no gallop and no friction rub.   No murmur heard. Pulmonary/Chest: Effort normal and breath sounds normal. She has no wheezes. She has no rales.  Abdominal: Soft. Bowel sounds are normal. She exhibits no distension and no mass. There is tenderness. There is no rebound and no guarding.  Tenderness on her right side, well healing surgical incision surgical site  Musculoskeletal: Normal range of motion. She exhibits no edema.  Lymphadenopathy:    She has no cervical adenopathy.  Neurological: She is alert and oriented to person, place, and time. She has normal reflexes. No cranial nerve deficit. Gait normal. Coordination normal.  Skin: Skin is warm and dry. No rash noted.  Psychiatric: Mood, memory, affect and judgment normal.  Nursing note and vitals reviewed.   LABORATORY DATA:  I have reviewed the data as listed Lab Results  Component Value Date   WBC 9.3 02/04/2016   HGB 12.9 02/04/2016   HCT 39.3 02/04/2016   MCV 87.9 02/04/2016   PLT 223 02/04/2016   CMP     Component Value Date/Time   NA 141 02/04/2016 0440   NA 144 06/18/2015 1231   K 3.7 02/04/2016 0440   CL 104 02/04/2016 0440   CO2 27 02/04/2016 0440   GLUCOSE 112* 02/04/2016 0440   GLUCOSE 90 06/18/2015 1231   BUN 8 02/04/2016 0440   BUN 16 06/18/2015 1231   CREATININE 1.15* 02/04/2016 0440   CREATININE 1.01 03/28/2013 1621   CALCIUM 9.0 02/04/2016 0440   PROT 7.2 01/28/2016 1435   PROT 6.4 06/18/2015 1231   ALBUMIN 4.1 01/28/2016 1435   ALBUMIN 4.5 06/18/2015 1231   AST 19  01/28/2016 1435   ALT 19  01/28/2016 1435   ALKPHOS 43 01/28/2016 1435   BILITOT 0.4 01/28/2016 1435   BILITOT <0.2 06/18/2015 1231   GFRNONAA 53* 02/04/2016 0440   GFRNONAA 65 03/28/2013 1621   GFRAA >60 02/04/2016 0440   GFRAA 74 03/28/2013 1621    RADIOGRAPHIC STUDIES: I have personally reviewed the radiological images as listed and agreed with the findings in the report. Study Result     CLINICAL DATA: Right lower quadrant pain with nausea and vomiting. History of ruptured appendicitis last March  EXAM: CT ABDOMEN AND PELVIS WITH CONTRAST  TECHNIQUE: Multidetector CT imaging of the abdomen and pelvis was performed using the standard protocol following bolus administration of intravenous contrast.  CONTRAST: 173mL OMNIPAQUE IOHEXOL 300 MG/ML SOLN  COMPARISON: 10/29/2015  FINDINGS: Lung bases are clear. No effusions. Heart is normal size.  Mild diffuse fatty infiltration of the liver. Small layering high density in the gallbladder compatible with small stones. Spleen, pancreas, adrenals and kidneys are unremarkable.  Persistent fluid collection noted in the right lower quadrant at the base of the cecum and measures 4.8 x 4.2 cm compared to 5.4 x 4.6 cm previously. Moderate free fluid noted in the cul-de-sac of the pelvis. Bilateral ovarian cystic areas are noted, measuring 2.9 cm on the left, stable and 3.8 cm on the right (which is near the midline), also stable. Stomach, large and small bowel are unremarkable. Aorta is normal caliber. No acute bony abnormality or focal bone lesion.  IMPRESSION: Continued fluid collection near the base of the cecum concerning for recurrent/residual abscess. This is slightly smaller than prior study.  Bilateral ovarian cystic lesions again noted, stable. Moderate free fluid in the cul-de-sac of the pelvis also stable.  Mild diffuse fatty infiltration of the liver.  Cholelithiasis.   Electronically  Signed  By: Rolm Baptise M.D.  On: 01/07/2016 11:09   Study Result     CLINICAL DATA: Right lower quadrant pain and nausea for 3 weeks, history of abscess drainage for perforated appendicitis  EXAM: CT ABDOMEN AND PELVIS WITH CONTRAST  TECHNIQUE: Multidetector CT imaging of the abdomen and pelvis was performed using the standard protocol following bolus administration of intravenous contrast.  CONTRAST: 160mL OMNIPAQUE IOHEXOL 300 MG/ML SOLN  COMPARISON: 08/07/2015 and 08/06/2015  FINDINGS: Lung bases shows stable 5 mm nodule in right middle lobe.  Sagittal images of the spine shows mild degenerative changes lower thoracic spine.  Enhanced liver, pancreas, spleen and adrenal glands are unremarkable. Small accessory splenule. No calcified gallstones are noted within gallbladder. Enhanced kidneys are symmetrical in size. No hydronephrosis or hydroureter. Tiny umbilical hernia containing fat without evidence of acute complication.  Delayed renal images shows bilateral renal symmetrical excretion. Bilateral visualized proximal ureter is unremarkable.  Again noted small to moderate free fluid within posterior pelvis. The uterus is unremarkable.  There is a recurrent fluid collection in the region of the appendix measures 5.6 x 4.6 cm. Measures 23.5 Hounsfield units in attenuation. This is suspicious for recurrent abscess. The appendix is not identified. There is contrast opacification of the cecum. There is tiny amount of contrast at the base of the appendix in axial image 69. There is no evidence of contrast extravasation. There is no evidence of contrast material within the collection.  The terminal ileum is unremarkable.  No small bowel obstruction. The uterus is unremarkable. Stable cystic structure within left ovary measures about 2.7 cm. No distal colonic obstruction. The urinary bladder is unremarkable. No inguinal  adenopathy.  IMPRESSION: 1. There is a  complex collection in right lower quadrant in the region of the appendix measures 5.6 x 4.6 cm. This is suspicious for recurrent inflammatory process or abscess. There is no evidence of contrast extravasation or contrast material entering the collection. The appendix is not identified. Unremarkable terminal ileum. No significant pericecal stranding. 2. No small bowel obstruction. 3. Again noted small to moderate pelvic free fluid within posterior cul-de-sac. Stable cystic structure within left ovary measures 2.7 cm. 4. No hydronephrosis or hydroureter.   Electronically Signed  By: Lahoma Crocker M.D.  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 [name] No prior C-scope Bilateral cystic lesions   We discussed appendiceal carcinoma in general focusing on mucinous histology. Discussed the limited treatment data available. I advised her that currently we extrapolated data from colon cancer. Reviewed five-year overall survival data for mucinous appendiceal carcinomas, stage by stage, five-year overall survival for her is estimated at around 30%, in spite of negative LN and stage II disease. We discussed that the major site of failure is within the abdominal cavity. We discussed the potential role for HIPEC.  I have recommended proceeding as follows; 1. Keeping her follow-up appointment with Dr. [name] next week 2. CT scan of the chest for completeness. 3. Referral to [facility] for consideration of HIPEC, if it is deemed the appropriate therapy for her I advised her that her bilateral ovarian cystic lesions will be addressed at that time. If not we will proceed with evaluation moving forward. I will arrange an appointment with Dr. Raylene Miyamoto. 4. She has never had a C-scope and this will need to be addressed  at follow-up  I will see her back pending the above.  Labs today as detailed below:  Orders Placed This Encounter  Procedures  . CT Chest W Contrast    Standing Status: Future     Number of Occurrences:      Standing Expiration Date: 02/11/2017    Order Specific Question:  If indicated for the ordered procedure, I authorize the administration of contrast media per Radiology protocol    Answer:  Yes    Order Specific Question:  Reason for Exam (SYMPTOM  OR DIAGNOSIS REQUIRED)    Answer:  mucinous appendicial carcinoma, staging    Order Specific Question:  Is the patient pregnant?    Answer:  No    Order Specific Question:  Preferred imaging location?    Answer:  [facility]  . Folate    Standing Status: Future     Number of Occurrences: 1     Standing Expiration Date: 02/11/2017  . Ferritin    Standing Status: Future     Number of Occurrences: 1     Standing Expiration Date: 02/11/2017  . Vitamin B12    Standing Status: Future     Number of Occurrences: 1     Standing Expiration Date: 02/11/2017  . CEA    Standing Status: Future     Number of Occurrences: 1     Standing Expiration Date: 02/11/2017  . CA 125    Standing Status: Future     Number of Occurrences: 1     Standing Expiration Date: 02/11/2017  . Iron and TIBC    Standing Status: Future     Number of Occurrences: 1     Standing Expiration Date: 02/11/2017  . Comprehensive metabolic panel  Standing Status: Future     Number of Occurrences: 1     Standing Expiration Date: 02/11/2017   All questions were answered. The patient knows to call the clinic with any problems, questions or concerns.  This document serves as a record of services personally performed by Ancil Linsey, MD. It was created on her behalf by Kandace Blitz, a trained medical scribe. The creation of this record is based on the scribe's personal observations and the provider's statements to them. This document has been checked and  approved by the attending provider.  I have reviewed the above documentation for accuracy and completeness, and I agree with the above.  This note was electronically signed.  Molli Hazard, MD  02/12/2016 4:25 PM

## 2016-02-13 ENCOUNTER — Other Ambulatory Visit (HOSPITAL_COMMUNITY): Payer: Self-pay | Admitting: Oncology

## 2016-02-13 DIAGNOSIS — E876 Hypokalemia: Secondary | ICD-10-CM

## 2016-02-13 DIAGNOSIS — D509 Iron deficiency anemia, unspecified: Secondary | ICD-10-CM

## 2016-02-13 LAB — CA 125: CA 125: 24.2 U/mL (ref 0.0–38.1)

## 2016-02-13 LAB — CEA: CEA: 1.5 ng/mL (ref 0.0–4.7)

## 2016-02-13 MED ORDER — POTASSIUM CHLORIDE CRYS ER 20 MEQ PO TBCR: 20.0000 meq | EXTENDED_RELEASE_TABLET | Freq: Two times a day (BID) | ORAL | Status: DC

## 2016-02-13 MED ORDER — POLYSACCHAR IRON-FA-B12 150-1-25 MG-MG-MCG PO CAPS: 1.0000 | ORAL_CAPSULE | Freq: Every day | ORAL | Status: DC

## 2016-02-18 ENCOUNTER — Ambulatory Visit (HOSPITAL_COMMUNITY)
Admission: RE | Admit: 2016-02-18 | Discharge: 2016-02-18 | Disposition: A | Discharge status: Home / Self Care | Payer: Commercial Managed Care - HMO | Source: Ambulatory Visit | Attending: Hematology & Oncology | Admitting: Hematology & Oncology

## 2016-02-18 ENCOUNTER — Other Ambulatory Visit (HOSPITAL_COMMUNITY): Payer: Self-pay | Admitting: Oncology

## 2016-02-18 DIAGNOSIS — D373 Neoplasm of uncertain behavior of appendix: Secondary | ICD-10-CM

## 2016-02-18 DIAGNOSIS — E079 Disorder of thyroid, unspecified: Secondary | ICD-10-CM | POA: Insufficient documentation

## 2016-02-18 DIAGNOSIS — K760 Fatty (change of) liver, not elsewhere classified: Secondary | ICD-10-CM | POA: Insufficient documentation

## 2016-02-18 DIAGNOSIS — E041 Nontoxic single thyroid nodule: Secondary | ICD-10-CM

## 2016-02-18 DIAGNOSIS — R918 Other nonspecific abnormal finding of lung field: Secondary | ICD-10-CM | POA: Diagnosis not present

## 2016-02-18 DIAGNOSIS — D490 Neoplasm of unspecified behavior of digestive system: Secondary | ICD-10-CM | POA: Insufficient documentation

## 2016-02-18 IMAGING — CT CT CHEST W/ CM
2 of 3 series · 15 of 36 positions shown, 18 images · IV contrast (Omnipaque 300)
Comparison: CT abdomen pelvis [DATE] and [DATE].

CLINICAL DATA: Pulmonary nodule follow-up. Appendiceal carcinoma
staging.

EXAM:
CT CHEST WITH CONTRAST
TECHNIQUE: Multidetector CT imaging of the chest was performed during
intravenous contrast administration.
CONTRAST:  75mL OMNIPAQUE IOHEXOL 300 MG/ML  SOLN

[Series 2: chestroutine 5.0 b40f · axial · 0.75mm/px · z∈[-308,-8]mm · 12 of 72 slices shown, 15 images]
[im 6/72  mediastinal]
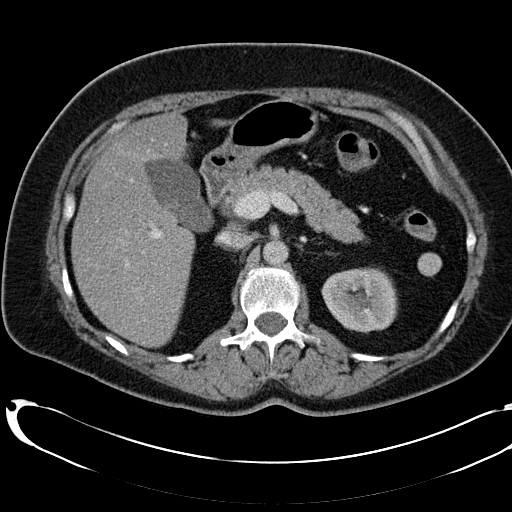
[im 6/72  lung]
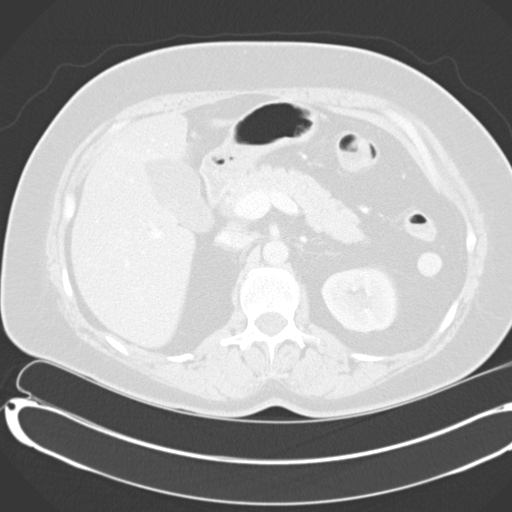
[im 11/72  lung]
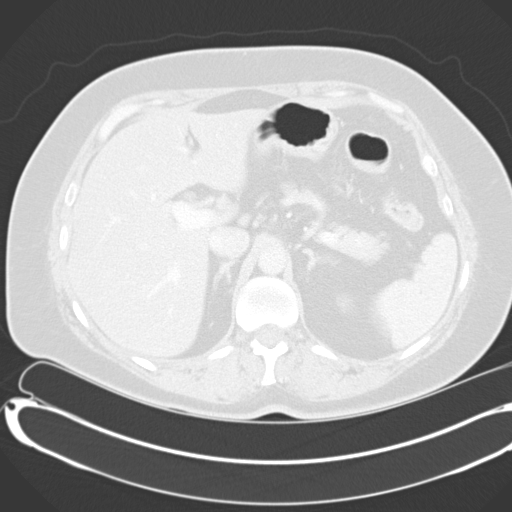
[im 16/72  lung]
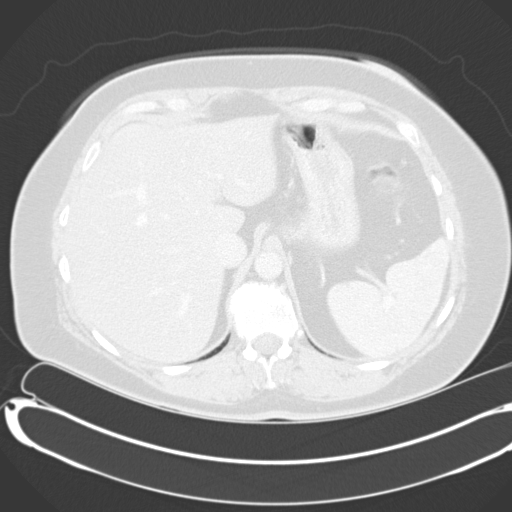
[im 22/72  lung]
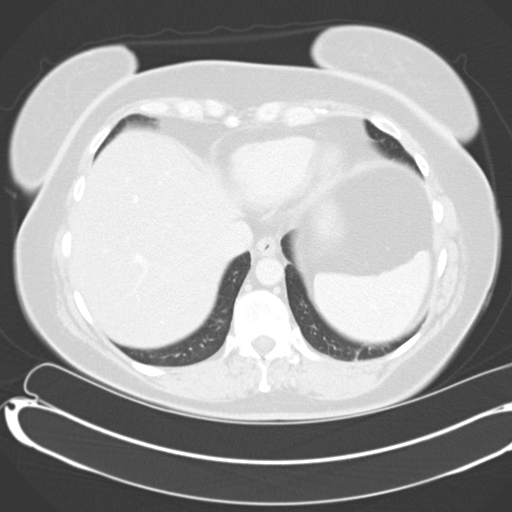
[im 27/72  mediastinal]
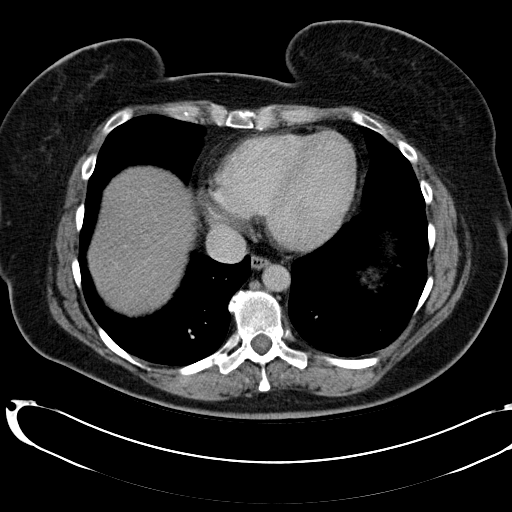
[im 27/72  lung]
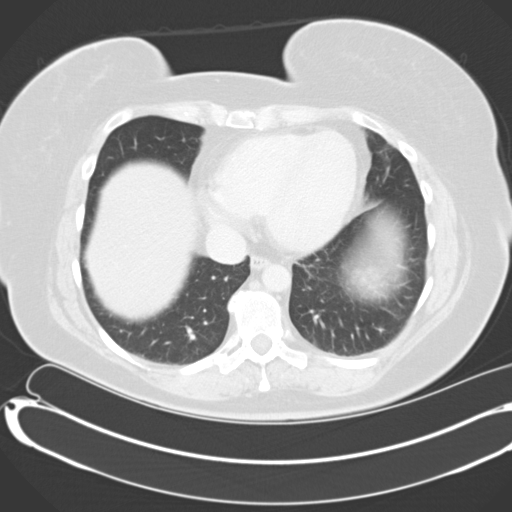
[im 32/72  lung]
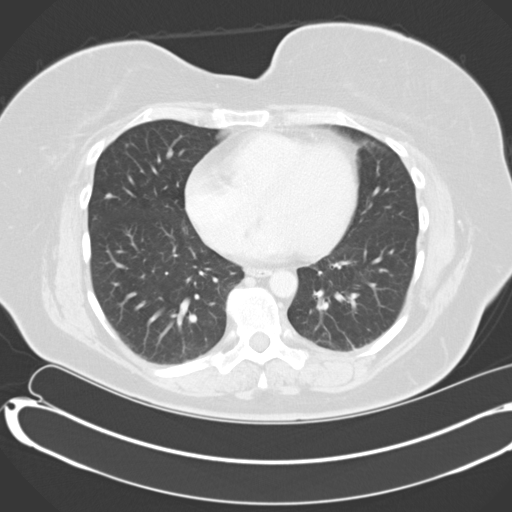
[im 40/72  lung]
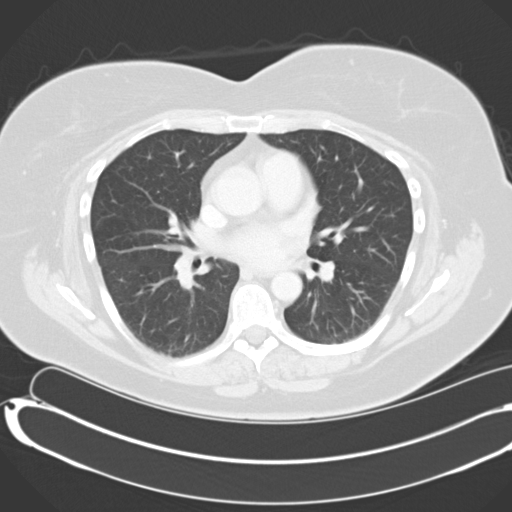
[im 45/72  lung]
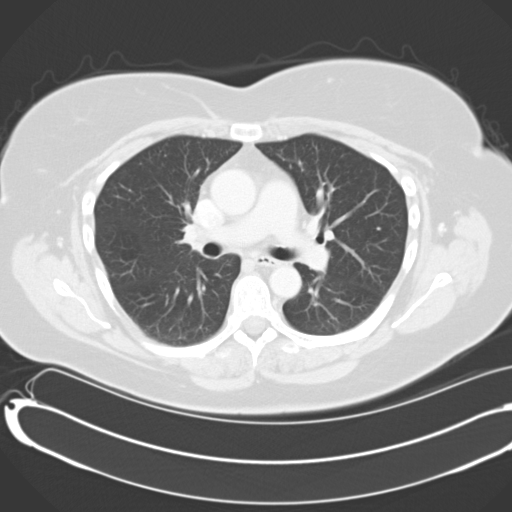
[im 50/72  mediastinal]
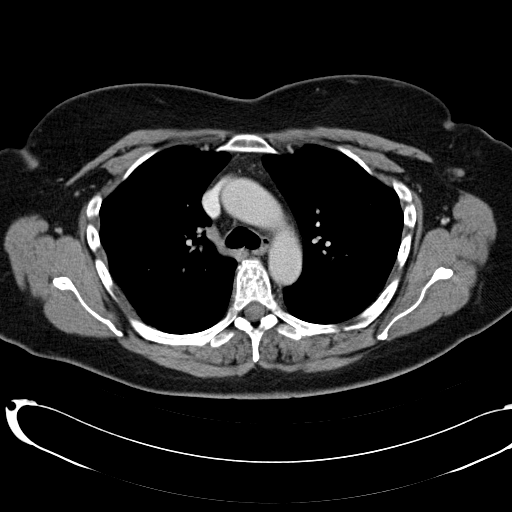
[im 50/72  lung]
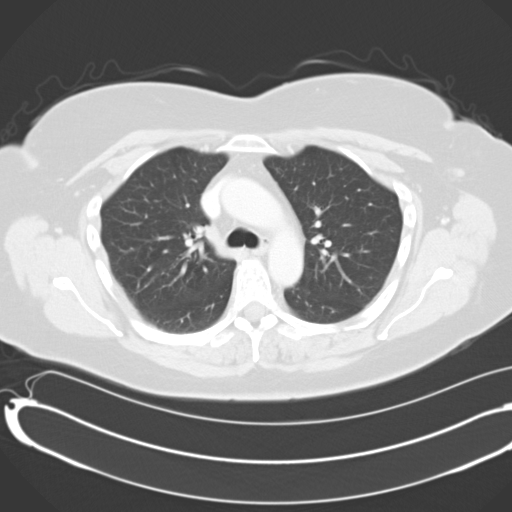
[im 56/72  lung]
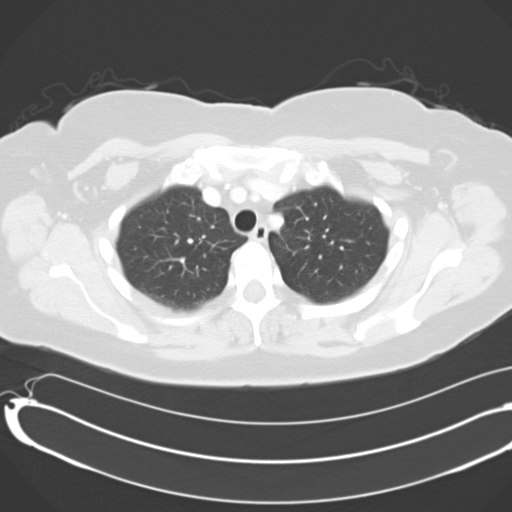
[im 61/72  lung]
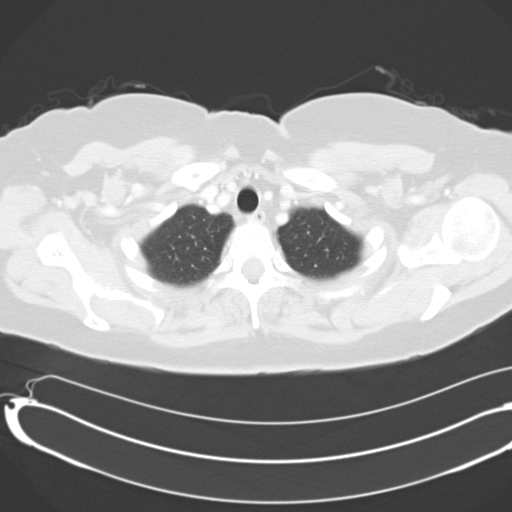
[im 66/72  lung]
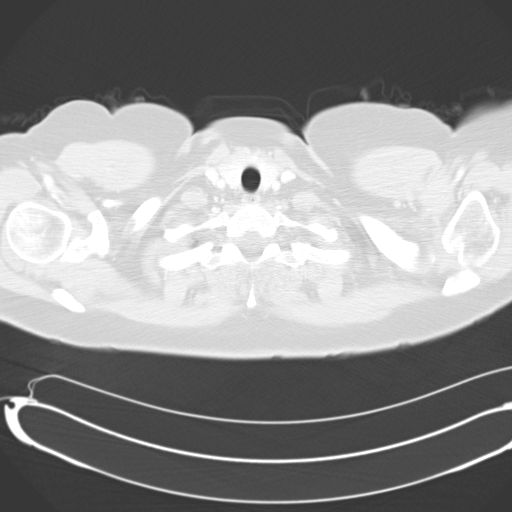

[Series 4: mpr coronal chest 3mm · coronal · 0.70mm/px · 3 of 95 slices shown]
[im 19/95  lung]
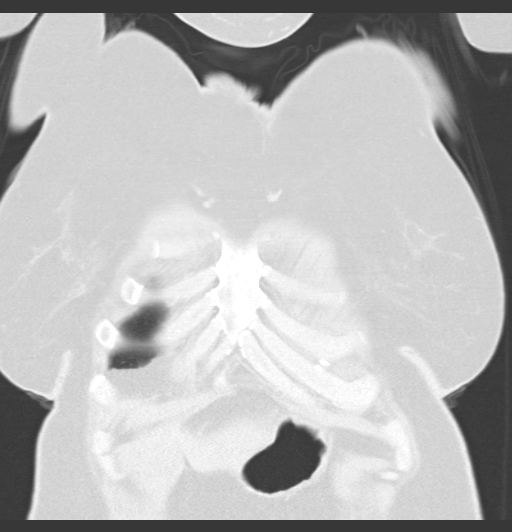
[im 38/95  lung]
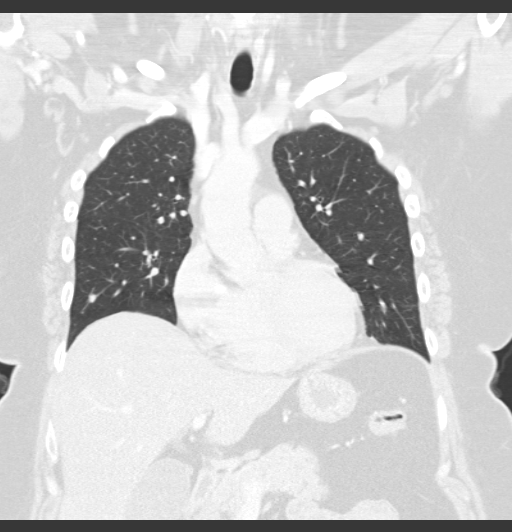
[im 57/95  lung]
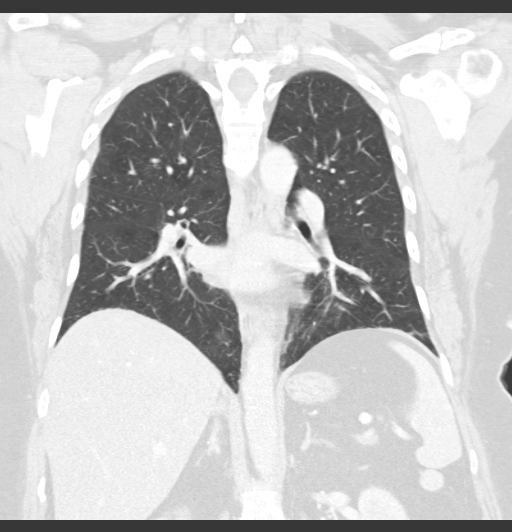

[15 of 36 positions shown; findings below may reference images not displayed]

FINDINGS: Mediastinum/Nodes: Low-attenuation lesion in the right lobe of the
thyroid measures 1.5 cm. No pathologically enlarged mediastinal,
hilar or axillary lymph nodes. Heart size normal. No pericardial
effusion.

Lungs/Pleura: Scattered pulmonary nodules measure up to 5 mm in the
right lower lobe, stable from [DATE]. No pleural fluid. Airway
is unremarkable.

Upper abdomen: Visualized portion of the liver is decreased in
attenuation diffusely. Visualized portions of the gallbladder,
adrenal glands, kidneys, spleen, pancreas, stomach and bowel are
grossly unremarkable.

Musculoskeletal: No worrisome lytic or sclerotic lesions. Lucent
lesion in the T11 vertebral body is unchanged and likely benign.
IMPRESSION: 1. A few scattered pulmonary nodules are unchanged from [DATE].
2. Low-attenuation lesion in the right lobe of the thyroid. Consider
further evaluation with thyroid ultrasound. If patient is clinically
hyperthyroid, consider nuclear medicine thyroid uptake and scan.
3. Hepatic steatosis.

## 2016-02-18 MED ORDER — IOHEXOL 300 MG/ML  SOLN
75.0000 mL | Freq: Once | INTRAMUSCULAR | Status: AC | PRN
  Administered 2016-02-18: 75 mL via INTRAVENOUS

## 2016-02-23 ENCOUNTER — Ambulatory Visit (HOSPITAL_COMMUNITY): Payer: Commercial Managed Care - HMO | Admitting: Hematology & Oncology

## 2016-04-09 ENCOUNTER — Ambulatory Visit (INDEPENDENT_AMBULATORY_CARE_PROVIDER_SITE_OTHER): Payer: Commercial Managed Care - HMO | Admitting: Nurse Practitioner

## 2016-04-09 ENCOUNTER — Encounter: Payer: Self-pay | Admitting: Nurse Practitioner

## 2016-04-09 VITALS — BP 115/79 | HR 76 | Temp 96.9°F | Ht 70.0 in | Wt 233.0 lb

## 2016-04-09 DIAGNOSIS — Z09 Encounter for follow-up examination after completed treatment for conditions other than malignant neoplasm: Secondary | ICD-10-CM

## 2016-04-09 DIAGNOSIS — E034 Atrophy of thyroid (acquired): Secondary | ICD-10-CM

## 2016-04-09 DIAGNOSIS — I10 Essential (primary) hypertension: Secondary | ICD-10-CM

## 2016-04-09 DIAGNOSIS — E038 Other specified hypothyroidism: Secondary | ICD-10-CM

## 2016-04-09 DIAGNOSIS — D490 Neoplasm of unspecified behavior of digestive system: Secondary | ICD-10-CM

## 2016-04-09 DIAGNOSIS — D649 Anemia, unspecified: Secondary | ICD-10-CM

## 2016-04-09 DIAGNOSIS — E876 Hypokalemia: Secondary | ICD-10-CM

## 2016-04-09 DIAGNOSIS — D373 Neoplasm of uncertain behavior of appendix: Secondary | ICD-10-CM

## 2016-04-09 NOTE — Progress Notes (Signed)
   Subjective:    Patient ID: Casey [name], female    DOB: 1961/01/29, 55 y.o.   MRN: [id number]  HPI Patient is here today for hospital follow up- she was recently in hospital for special chemo treatment for appendix cancer. SHe was in surgery for over 8 hours and they but heated chemo solution in abdomen and let it "slosh" around white they checked abdomen for other lesions. SHe is still very weak. Starting to loose her hair. SHe needs her lab check today as well as weight check.  * they are currently holding valsartan due to low blood pressure  * also not taking K supplements and was stopped by hospital because they were concerned it would get to high.   * Has not has thyroid checked in over  Months  Review of Systems  Constitutional: Positive for appetite change (decreased due to nausea) and fatigue.  HENT: Negative.   Respiratory: Negative.   Cardiovascular: Negative.   Gastrointestinal: Positive for abdominal pain (mild secondary to abdominal surgery).  Genitourinary: Negative.   Neurological: Negative.   Psychiatric/Behavioral: Negative.        Objective:   Physical Exam  Constitutional: She is oriented to person, place, and time. She appears well-developed and well-nourished. No distress.  Cardiovascular: Normal rate, regular rhythm and normal heart sounds.   Pulmonary/Chest: Effort normal and breath sounds normal.  Abdominal: There is tenderness (diffuse tenderness- secondary to surgery).  Neurological: She is alert and oriented to person, place, and time. She has normal reflexes.  Skin: Skin is warm.  Psychiatric: She has a normal mood and affect. Her behavior is normal. Judgment and thought content normal.   BP 115/79 mmHg  Pulse 76  Temp(Src) 96.9 F (36.1 C) (Oral)  Ht _0  (1.778 m)  Wt 233 lb (105.688 kg)  BMI 33.43 kg/m2  LMP 05/23/1995      Assessment & Plan:   1. Hospital discharge follow-up recordsreviewed - Anemia Profile B  2. Low grade  mucinous neoplasm of appendix Keep follow up with oncology  3. Essential hypertension Watch blood pressure at home- if starts going up let me know - CMP14+EGFR  4. Hypothyroidism due to acquired atrophy of thyroid Labs pending - Thyroid Panel With TSH  Follow up PRN  [name] Hassell Done, FNP

## 2016-04-10 LAB — ANEMIA PROFILE B
Basophils Absolute: 0 10*3/uL (ref 0.0–0.2)
Basos: 0 %
EOS (ABSOLUTE): 0.1 10*3/uL (ref 0.0–0.4)
EOS: 2 %
FOLATE: 7 ng/mL (ref >3.0)
Ferritin: 268 ng/mL — ABNORMAL HIGH (ref 15–150)
HEMOGLOBIN: 9.6 g/dL — AB (ref 11.1–15.9)
Hematocrit: 28.1 % — ABNORMAL LOW (ref 34.0–46.6)
IMMATURE GRANULOCYTES: 0 %
IRON SATURATION: 16 % (ref 15–55)
IRON: 46 ug/dL (ref 27–159)
Immature Grans (Abs): 0 10*3/uL (ref 0.0–0.1)
LYMPHS ABS: 1.2 10*3/uL (ref 0.7–3.1)
Lymphs: 25 %
MCH: 29.5 pg (ref 26.6–33.0)
MCHC: 34.2 g/dL (ref 31.5–35.7)
MCV: 87 fL (ref 79–97)
Monocytes Absolute: 0.8 10*3/uL (ref 0.1–0.9)
Monocytes: 16 %
NEUTROS ABS: 2.8 10*3/uL (ref 1.4–7.0)
Neutrophils: 57 %
Platelets: 276 10*3/uL (ref 150–379)
RBC: 3.25 x10E6/uL — ABNORMAL LOW (ref 3.77–5.28)
RDW: 14.8 % (ref 12.3–15.4)
Retic Ct Pct: 1.5 % (ref 0.6–2.6)
Total Iron Binding Capacity: 291 ug/dL (ref 250–450)
UIBC: 245 ug/dL (ref 131–425)
VITAMIN B 12: 265 pg/mL (ref 211–946)
WBC: 5 10*3/uL (ref 3.4–10.8)

## 2016-04-10 LAB — THYROID PANEL WITH TSH
FREE THYROXINE INDEX: 3.3 (ref 1.2–4.9)
T3 Uptake Ratio: 28 % (ref 24–39)
T4 TOTAL: 11.9 ug/dL (ref 4.5–12.0)
TSH: 5.87 u[IU]/mL — ABNORMAL HIGH (ref 0.450–4.500)

## 2016-04-10 LAB — CMP14+EGFR
A/G RATIO: 1.6 (ref 1.2–2.2)
ALT: 16 IU/L (ref 0–32)
AST: 17 IU/L (ref 0–40)
Albumin: 3.6 g/dL (ref 3.5–5.5)
Alkaline Phosphatase: 54 IU/L (ref 39–117)
BILIRUBIN TOTAL: 0.3 mg/dL (ref 0.0–1.2)
BUN/Creatinine Ratio: 11 (ref 9–23)
BUN: 10 mg/dL (ref 6–24)
CALCIUM: 8.9 mg/dL (ref 8.7–10.2)
CHLORIDE: 101 mmol/L (ref 96–106)
CO2: 26 mmol/L (ref 18–29)
Creatinine, Ser: 0.95 mg/dL (ref 0.57–1.00)
GFR, EST AFRICAN AMERICAN: 79 mL/min/{1.73_m2} (ref >59)
GFR, EST NON AFRICAN AMERICAN: 68 mL/min/{1.73_m2} (ref >59)
GLOBULIN, TOTAL: 2.3 g/dL (ref 1.5–4.5)
Glucose: 90 mg/dL (ref 65–99)
POTASSIUM: 3.4 mmol/L — AB (ref 3.5–5.2)
SODIUM: 142 mmol/L (ref 134–144)
Total Protein: 5.9 g/dL — ABNORMAL LOW (ref 6.0–8.5)

## 2016-04-12 ENCOUNTER — Other Ambulatory Visit: Payer: Self-pay | Admitting: Nurse Practitioner

## 2016-04-12 NOTE — Addendum Note (Signed)
Addended by: Wardell Heath on: 04/12/2016 02:56 PM   Modules accepted: Orders

## 2016-04-16 ENCOUNTER — Other Ambulatory Visit: Payer: Self-pay | Admitting: Nurse Practitioner

## 2016-04-16 DIAGNOSIS — D373 Neoplasm of uncertain behavior of appendix: Secondary | ICD-10-CM

## 2016-04-16 MED ORDER — ONDANSETRON HCL 8 MG PO TABS: 8.0000 mg | ORAL_TABLET | Freq: Three times a day (TID) | ORAL | Status: DC | PRN

## 2016-04-16 NOTE — Telephone Encounter (Signed)
Patient aware.

## 2016-04-16 NOTE — Telephone Encounter (Signed)
zofran refilled.

## 2016-05-10 ENCOUNTER — Other Ambulatory Visit: Payer: Self-pay | Admitting: Nurse Practitioner

## 2016-05-20 ENCOUNTER — Ambulatory Visit (HOSPITAL_COMMUNITY)
Admission: RE | Admit: 2016-05-20 | Discharge: 2016-05-20 | Disposition: A | Discharge status: Home / Self Care | Payer: Commercial Managed Care - HMO | Source: Ambulatory Visit | Attending: Oncology | Admitting: Oncology

## 2016-05-20 DIAGNOSIS — E041 Nontoxic single thyroid nodule: Secondary | ICD-10-CM

## 2016-05-20 DIAGNOSIS — E042 Nontoxic multinodular goiter: Secondary | ICD-10-CM | POA: Insufficient documentation

## 2016-05-21 ENCOUNTER — Other Ambulatory Visit (HOSPITAL_COMMUNITY): Payer: Self-pay | Admitting: Oncology

## 2016-05-21 DIAGNOSIS — E041 Nontoxic single thyroid nodule: Secondary | ICD-10-CM

## 2016-05-25 ENCOUNTER — Other Ambulatory Visit: Payer: Self-pay | Admitting: Nurse Practitioner

## 2016-05-27 ENCOUNTER — Encounter: Payer: Self-pay | Admitting: Nurse Practitioner

## 2016-05-27 ENCOUNTER — Ambulatory Visit (INDEPENDENT_AMBULATORY_CARE_PROVIDER_SITE_OTHER): Payer: Commercial Managed Care - HMO | Admitting: Nurse Practitioner

## 2016-05-27 VITALS — BP 152/93 | HR 72 | Temp 97.9°F | Ht 70.0 in | Wt 210.0 lb

## 2016-05-27 DIAGNOSIS — I10 Essential (primary) hypertension: Secondary | ICD-10-CM | POA: Diagnosis not present

## 2016-05-27 DIAGNOSIS — G4700 Insomnia, unspecified: Secondary | ICD-10-CM | POA: Diagnosis not present

## 2016-05-27 DIAGNOSIS — B372 Candidiasis of skin and nail: Secondary | ICD-10-CM | POA: Diagnosis not present

## 2016-05-27 DIAGNOSIS — R5383 Other fatigue: Secondary | ICD-10-CM

## 2016-05-27 DIAGNOSIS — E034 Atrophy of thyroid (acquired): Secondary | ICD-10-CM

## 2016-05-27 DIAGNOSIS — E038 Other specified hypothyroidism: Secondary | ICD-10-CM

## 2016-05-27 MED ORDER — NYSTATIN 100000 UNIT/GM EX CREA: 1.0000 "application " | TOPICAL_CREAM | Freq: Two times a day (BID) | CUTANEOUS | Status: DC

## 2016-05-27 MED ORDER — FLUCONAZOLE 150 MG PO TABS: ORAL_TABLET | ORAL | Status: DC

## 2016-05-27 MED ORDER — ZOLPIDEM TARTRATE ER 12.5 MG PO TBCR: 12.5000 mg | EXTENDED_RELEASE_TABLET | Freq: Every evening | ORAL | Status: DC | PRN

## 2016-05-27 MED ORDER — SCOPOLAMINE 1 MG/3DAYS TD PT72: 1.0000 | MEDICATED_PATCH | TRANSDERMAL | Status: DC

## 2016-05-27 NOTE — Patient Instructions (Signed)
Insomnia Insomnia is a sleep disorder that makes it difficult to fall asleep or to stay asleep. Insomnia can cause tiredness (fatigue), low energy, difficulty concentrating, mood swings, and poor performance at work or school.  There are three different ways to classify insomnia:  Difficulty falling asleep.  Difficulty staying asleep.  Waking up too early in the morning. Any type of insomnia can be long-term (chronic) or short-term (acute). Both are common. Short-term insomnia usually lasts for three months or less. Chronic insomnia occurs at least three times a week for longer than three months. CAUSES  Insomnia may be caused by another condition, situation, or substance, such as:  Anxiety.  Certain medicines.  Gastroesophageal reflux disease (GERD) or other gastrointestinal conditions.  Asthma or other breathing conditions.  Restless legs syndrome, sleep apnea, or other sleep disorders.  Chronic pain.  Menopause. This may include hot flashes.  Stroke.  Abuse of alcohol, tobacco, or illegal drugs.  Depression.  Caffeine.   Neurological disorders, such as Alzheimer disease.  An overactive thyroid (hyperthyroidism). The cause of insomnia may not be known. RISK FACTORS Risk factors for insomnia include:  Gender. Women are more commonly affected than men.  Age. Insomnia is more common as you get older.  Stress. This may involve your professional or personal life.  Income. Insomnia is more common in people with lower income.  Lack of exercise.   Irregular work schedule or night shifts.  Traveling between different time zones. SIGNS AND SYMPTOMS If you have insomnia, trouble falling asleep or trouble staying asleep is the main symptom. This may lead to other symptoms, such as:  Feeling fatigued.  Feeling nervous about going to sleep.  Not feeling rested in the morning.  Having trouble concentrating.  Feeling irritable, anxious, or depressed. TREATMENT   Treatment for insomnia depends on the cause. If your insomnia is caused by an underlying condition, treatment will focus on addressing the condition. Treatment may also include:   Medicines to help you sleep.  Counseling or therapy.  Lifestyle adjustments. HOME CARE INSTRUCTIONS   Take medicines only as directed by your health care provider.  Keep regular sleeping and waking hours. Avoid naps.  Keep a sleep diary to help you and your health care provider figure out what could be causing your insomnia. Include:   When you sleep.  When you wake up during the night.  How well you sleep.   How rested you feel the next day.  Any side effects of medicines you are taking.  What you eat and drink.   Make your bedroom a comfortable place where it is easy to fall asleep:  Put up shades or special blackout curtains to block light from outside.  Use a white noise machine to block noise.  Keep the temperature cool.   Exercise regularly as directed by your health care provider. Avoid exercising right before bedtime.  Use relaxation techniques to manage stress. Ask your health care provider to suggest some techniques that may work well for you. These may include:  Breathing exercises.  Routines to release muscle tension.  Visualizing peaceful scenes.  Cut back on alcohol, caffeinated beverages, and cigarettes, especially close to bedtime. These can disrupt your sleep.  Do not overeat or eat spicy foods right before bedtime. This can lead to digestive discomfort that can make it hard for you to sleep.  Limit screen use before bedtime. This includes:  Watching TV.  Using your smartphone, tablet, and computer.  Stick to a routine. This   can help you fall asleep faster. Try to do a quiet activity, brush your teeth, and go to bed at the same time each night.  Get out of bed if you are still awake after 15 minutes of trying to sleep. Keep the lights down, but try reading or  doing a quiet activity. When you feel sleepy, go back to bed.  Make sure that you drive carefully. Avoid driving if you feel very sleepy.  Keep all follow-up appointments as directed by your health care provider. This is important. SEEK MEDICAL CARE IF:   You are tired throughout the day or have trouble in your daily routine due to sleepiness.  You continue to have sleep problems or your sleep problems get worse. SEEK IMMEDIATE MEDICAL CARE IF:   You have serious thoughts about hurting yourself or someone else.   This information is not intended to replace advice given to you by your health care provider. Make sure you discuss any questions you have with your health care provider.   Document Released: 11/05/2000 Document Revised: 07/30/2015 Document Reviewed: 08/09/2014 Elsevier Interactive Patient Education 2016 [employer]  

## 2016-05-27 NOTE — Progress Notes (Signed)
   Subjective:    Patient ID: Casey [name], female    DOB: 28-Jul-1961, 55 y.o.   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 Her weight is down because she cannot not eat much. She is very pale. Unable to sleep laying down because it hurts bil sides of her abdomen, so she is sleeping in a recliner. This has made sleeping very difficult. Has been taking pain pills to sleep but that only last about 3 hours at a time. She says that she does not even nap during the day.  * She has been holding diovan since her surgery and has only been taking metoprolol - she has been monitoring bloodd pressure at home and it has been slowly creeping into the 287'O systolic. * Rash in groin area since she was in hospital and now has under her breasts- rash burns and itches. * They have found a mass in thyroid and she is having bx tomorrow  Review of Systems  Constitutional: Negative.   HENT: Negative.   Respiratory: Negative.   Cardiovascular: Negative.   Genitourinary: Negative.   Neurological: Negative.   Psychiatric/Behavioral: Negative.   All other systems reviewed and are negative.      Objective:   Physical Exam  Constitutional: She is oriented to person, place, and time. She appears well-developed and well-nourished. No distress.  Cardiovascular: Normal rate, regular rhythm and normal heart sounds.   Pulmonary/Chest: Effort normal and breath sounds normal.  Abdominal: Soft. Bowel sounds are normal.  Neurological: She is alert and oriented to person, place, and time.  Skin: Skin is warm. Rash noted.  erthematous moist rash bil mammory folds and groin area  Psychiatric: She has a normal mood and affect.  Her behavior is normal. Judgment and thought content normal.   BP 152/93 mmHg  Pulse 72  Temp(Src) 97.9 F (36.6 C) (Oral)  Ht '5\' 10"'$  (1.778 m)  Wt 210 lb (95.255 kg)  BMI 30.13 kg/m2  LMP 05/23/1995      Assessment & Plan:  .1. Other fatigue - CMP14+EGFR - Anemia Profile B - scopolamine (TRANSDERM-SCOP) 1 MG/3DAYS; Place 1 patch (1.5 mg total) onto the skin every 3 (three) days.  Dispense: 10 patch; Refill: 1  2. Cutaneous candidiasis Avoid scratching Keep area as clean and dry as possible - nystatin cream (MYCOSTATIN); Apply 1 application topically 2 (two) times daily.  Dispense: 30 g; Refill: 0 - fluconazole (DIFLUCAN) 150 MG tablet; 1 po q week x 4 weeks  Dispense: 4 tablet; Refill: 0  3. Essential hypertension Add 1/2 dose of diovan back  Continue to keep diary of blood presure  4. Hypothyroidism due to acquired atrophy of thyroid - Thyroid Panel With TSH  5. Insomnia Bedtime ritual - zolpidem (AMBIEN CR) 12.5 MG CR tablet; Take 1 tablet (12.5 mg total) by mouth at bedtime as needed for sleep.  Dispense: 30 tablet; Refill: 0  will call with lab results RTO prn  [name] Hassell Done, FNP

## 2016-05-28 ENCOUNTER — Encounter (HOSPITAL_COMMUNITY): Payer: Self-pay

## 2016-05-28 ENCOUNTER — Ambulatory Visit (HOSPITAL_COMMUNITY)
Admission: RE | Admit: 2016-05-28 | Discharge: 2016-05-28 | Disposition: A | Discharge status: Home / Self Care | Payer: Commercial Managed Care - HMO | Source: Ambulatory Visit | Attending: Oncology | Admitting: Oncology

## 2016-05-28 DIAGNOSIS — E041 Nontoxic single thyroid nodule: Secondary | ICD-10-CM | POA: Diagnosis not present

## 2016-05-28 LAB — ANEMIA PROFILE B
BASOS ABS: 0.1 10*3/uL (ref 0.0–0.2)
BASOS: 1 %
EOS (ABSOLUTE): 0.2 10*3/uL (ref 0.0–0.4)
Eos: 3 %
FERRITIN: 110 ng/mL (ref 15–150)
Folate: 2.4 ng/mL — ABNORMAL LOW (ref >3.0)
Hematocrit: 34.5 % (ref 34.0–46.6)
Hemoglobin: 11.5 g/dL (ref 11.1–15.9)
IMMATURE GRANS (ABS): 0 10*3/uL (ref 0.0–0.1)
IMMATURE GRANULOCYTES: 0 %
Iron Saturation: 15 % (ref 15–55)
Iron: 51 ug/dL (ref 27–159)
LYMPHS: 29 %
Lymphocytes Absolute: 2.2 10*3/uL (ref 0.7–3.1)
MCH: 29.3 pg (ref 26.6–33.0)
MCHC: 33.3 g/dL (ref 31.5–35.7)
MCV: 88 fL (ref 79–97)
MONOS ABS: 0.7 10*3/uL (ref 0.1–0.9)
Monocytes: 9 %
NEUTROS PCT: 58 %
Neutrophils Absolute: 4.3 10*3/uL (ref 1.4–7.0)
PLATELETS: 265 10*3/uL (ref 150–379)
RBC: 3.92 x10E6/uL (ref 3.77–5.28)
RDW: 15.2 % (ref 12.3–15.4)
Retic Ct Pct: 1.2 % (ref 0.6–2.6)
Total Iron Binding Capacity: 331 ug/dL (ref 250–450)
UIBC: 280 ug/dL (ref 131–425)
Vitamin B-12: 229 pg/mL (ref 211–946)
WBC: 7.4 10*3/uL (ref 3.4–10.8)

## 2016-05-28 LAB — CMP14+EGFR
A/G RATIO: 1.7 (ref 1.2–2.2)
ALT: 10 IU/L (ref 0–32)
AST: 13 IU/L (ref 0–40)
Albumin: 4.2 g/dL (ref 3.5–5.5)
Alkaline Phosphatase: 41 IU/L (ref 39–117)
BILIRUBIN TOTAL: 0.4 mg/dL (ref 0.0–1.2)
BUN / CREAT RATIO: 10 (ref 9–23)
BUN: 9 mg/dL (ref 6–24)
CALCIUM: 9.6 mg/dL (ref 8.7–10.2)
CHLORIDE: 104 mmol/L (ref 96–106)
CO2: 25 mmol/L (ref 18–29)
Creatinine, Ser: 0.93 mg/dL (ref 0.57–1.00)
GFR, EST AFRICAN AMERICAN: 81 mL/min/{1.73_m2} (ref >59)
GFR, EST NON AFRICAN AMERICAN: 70 mL/min/{1.73_m2} (ref >59)
GLOBULIN, TOTAL: 2.5 g/dL (ref 1.5–4.5)
Glucose: 93 mg/dL (ref 65–99)
POTASSIUM: 3.2 mmol/L — AB (ref 3.5–5.2)
SODIUM: 146 mmol/L — AB (ref 134–144)
TOTAL PROTEIN: 6.7 g/dL (ref 6.0–8.5)

## 2016-05-28 LAB — THYROID PANEL WITH TSH
FREE THYROXINE INDEX: 3.1 (ref 1.2–4.9)
T3 UPTAKE RATIO: 28 % (ref 24–39)
T4, Total: 11.2 ug/dL (ref 4.5–12.0)
TSH: 4.46 u[IU]/mL (ref 0.450–4.500)

## 2016-05-28 MED ORDER — LIDOCAINE HCL (PF) 2 % IJ SOLN
10.0000 mL | Freq: Once | INTRAMUSCULAR | Status: AC
  Administered 2016-05-28: 10 mL

## 2016-05-28 MED ORDER — LIDOCAINE HCL (PF) 2 % IJ SOLN
INTRAMUSCULAR | Status: AC
  Administered 2016-05-28: 10 mL
  Filled 2016-05-28: qty 10

## 2016-05-28 NOTE — Discharge Instructions (Signed)

## 2016-05-28 NOTE — Procedures (Signed)
PreOperative Dx: RT thyroid nodule Postoperative Dx: RT thyroid nodule Procedure:   US guided FNA of RT thyroid nodule Radiologist:  Thornton Papas Anesthesia:  2 ml of 2% lidocaine Specimen:  FNA x 3  EBL:   < 1 ml Complications: None

## 2016-05-31 ENCOUNTER — Ambulatory Visit (HOSPITAL_COMMUNITY): Payer: Commercial Managed Care - HMO | Admitting: Hematology & Oncology

## 2016-06-01 ENCOUNTER — Encounter (HOSPITAL_COMMUNITY): Payer: Self-pay | Admitting: Hematology & Oncology

## 2016-06-01 ENCOUNTER — Encounter (HOSPITAL_COMMUNITY): Payer: Commercial Managed Care - HMO | Attending: Hematology & Oncology | Admitting: Hematology & Oncology

## 2016-06-01 VITALS — BP 151/94 | HR 72 | Temp 98.4°F | Resp 20 | Ht 70.6 in | Wt 206.5 lb

## 2016-06-01 DIAGNOSIS — C181 Malignant neoplasm of appendix: Secondary | ICD-10-CM | POA: Diagnosis not present

## 2016-06-01 DIAGNOSIS — D373 Neoplasm of uncertain behavior of appendix: Secondary | ICD-10-CM

## 2016-06-01 DIAGNOSIS — E538 Deficiency of other specified B group vitamins: Secondary | ICD-10-CM

## 2016-06-01 DIAGNOSIS — R197 Diarrhea, unspecified: Secondary | ICD-10-CM

## 2016-06-01 DIAGNOSIS — N951 Menopausal and female climacteric states: Secondary | ICD-10-CM

## 2016-06-01 MED ORDER — FOLIC ACID 1 MG PO TABS: 1.0000 mg | ORAL_TABLET | Freq: Every day | ORAL | Status: DC

## 2016-06-01 NOTE — Patient Instructions (Signed)
[place] [facility] Discharge Instructions  RECOMMENDATIONS MADE BY THE CONSULTANT AND ANY TEST RESULTS WILL BE SENT TO YOUR REFERRING PHYSICIAN.  You were seen by Dr. Whitney Muse today. Please sign a release for Doctor to get reports/results from [facility] Return to Clinic in 3 weeks to see Dr. Whitney Muse Please call the clinic with any results.  Thank you for choosing [place] at [facility] to provide your oncology and hematology care.  To afford each patient quality time with our provider, please arrive at least 15 minutes before your scheduled appointment time.   Beginning January 23rd 2017 lab work for the [facility] will be done in the  Main lab at [facility] on 1st floor. If you have a lab appointment with the [facility] please come in thru the  Main Entrance and check in at the main information desk  You need to re-schedule your appointment should you arrive 10 or more minutes late.  We strive to give you quality time with our providers, and arriving late affects you and other patients whose appointments are after yours.  Also, if you no show three or more times for appointments you may be dismissed from the clinic at the providers discretion.     Again, thank you for choosing [facility].  Our hope is that these requests will decrease the amount of time that you wait before being seen by our physicians.       _____________________________________________________________  Should you have questions after your visit to [facility], please contact our office at [phone] between the hours of 8:30 a.m. and 4:30 p.m.  Voicemails left after 4:30 p.m. will not be returned until the following business day.  For prescription refill requests, have your pharmacy contact our office.         Resources For Cancer Patients and their Caregivers ? American Cancer Society: Can assist with transportation, wigs,  general needs, runs Look Good Feel Better.        316-116-4737 ? Cancer Care: Provides financial assistance, online support groups, medication/co-pay assistance.  1-800-813-HOPE 814 066 0203) ? [place] Assists [place] cancer patients and their families through emotional , educational and financial support.  518-552-6953 ? [place] DSS Where to apply for food stamps, Medicaid and utility assistance. 231-754-5849 ? [facility]: Transportation to medical appointments. 410-247-0606 ? Social Security Administration: May apply for disability if have a Stage IV cancer. 551-352-8172 304-404-7885 ? [employer], Disability and Transit Services: Assists with nutrition, care and transit needs. [place] Support Programs: @10RELATIVEDAYS @ > Cancer Support Group  2nd Tuesday of the month 1pm-2pm, Journey Room  > Creative Journey  3rd Tuesday of the month 1130am-1pm, Journey Room  > Look Good Feel Better  1st Wednesday of the month 10am-12 noon, Journey Room (Call [facility] to register [phone])

## 2016-06-01 NOTE — Progress Notes (Signed)
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 [name] No prior C-scope  HISTORY OF PRESENTING ILLNESS:  Casey [name] 55 y.o. 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 She has undergone CRS/HIPEC at [facility] with Dr. Dub Amis. I do not have all final pathology but some details are noted below.  She had evidence of disease found at the time of surgery.    She is making a recovery but notes it has been slow. Diarrhea is chronic since her "original hemicolectomy." Her major concern is that she feels her hair is thinning. She wants to have her hair colored but has been afraid because of this.  She wants to plan a 9 day cruise to [place] and [place] with her daughter, her husband is reluctant for her to go as she is not quite "strong enough" She is planning on going back to work full time in august.   She also feels her hot flashes are worse. She is open to trying Effexor in the future. Abdominal pain is steadily improving. Appetite is improving. No nausea or vomiting. She has follow-up at [facility] in early September with imaging.    MEDICAL HISTORY:  Past Medical History  Diagnosis Date  . DUB (dysfunctional uterine bleeding)   . Palpitations   . Hypertension   . Hyperlipidemia   . Depression   . GERD (gastroesophageal reflux disease)   . Migraines   . Pulmonary nodule   . Dysrhythmia     pt states has PVCs   . Pneumonia     hx of had approx 22 years ago   . Graves disease   . Anxiety   . History of urinary tract infection     last one approx 4 years ago   . Cancer ([place])     premelanoma per hip   . History of blood transfusion   . Anemia   . PONV (postoperative nausea and vomiting)      SURGICAL HISTORY: Past Surgical History  Procedure Laterality Date  . Vesicovaginal fistula closure w/ tah      Dr. Margaretha Glassing  2 degree DUB   . Tonsillectomy    . Vaginal hysterectomy    . Dilation and curettage of uterus    . Infertility testing     . Appendectomy    . Diagnostic laparoscopy    . Laparoscopic partial colectomy N/A 02/02/2016    Procedure: LAPAROSCOPIC PARTIAL COlectomy right colon;  Surgeon: Greer Pickerel, MD;  Location: [facility] ORS;  Service: General;  Laterality: N/A;    SOCIAL HISTORY: Social History   Social History  . Marital Status: Married    Spouse Name: N/A  . Number of Children: N/A  . Years of Education: N/A   Occupational History  . Not on file.   Social History Main Topics  . Smoking status: Never Smoker   . Smokeless tobacco: Never Used  . Alcohol Use: No  . Drug Use: No  . Sexual Activity:    Partners: Male    Birth Control/ Protection: Surgical     Comment: [facility]   Other Topics Concern  . 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 Hypertension Mother   . Irregular heart beat Mother   . Thyroid disease Maternal Grandmother   . Cancer Maternal Grandmother     lung  . Cancer Paternal Grandfather     lung    ALLERGIES:  is allergic to labetalol; morphine and related; and zosyn.  MEDICATIONS:  Current Outpatient Prescriptions  Medication Sig Dispense Refill  . cetirizine (ZYRTEC) 10 MG tablet TAKE ONE (1) TABLET EACH DAY 30 tablet 9  . escitalopram (LEXAPRO) 10 MG tablet TAKE ONE (1) TABLET EACH DAY 30 tablet 1  . fenofibrate 160 MG  tablet TAKE ONE (1) TABLET EACH DAY 30 tablet 5  . fluconazole (DIFLUCAN) 150 MG tablet 1 po q week x 4 weeks 4 tablet 0  . folic acid (FOLVITE) 1 MG tablet Take 1 tablet (1 mg total) by mouth daily. 30 tablet 1  . levothyroxine (SYNTHROID, LEVOTHROID) 75 MCG tablet TAKE ONE (1) TABLET EACH DAY 30 tablet 1  . metoprolol succinate (TOPROL-XL) 50 MG 24 hr tablet TAKE ONE (1) TABLET EACH DAY 30 tablet 5  . nystatin cream (MYCOSTATIN) Apply 1 application topically 2 (two) times daily. 30 g 0  . ondansetron (ZOFRAN) 8 MG tablet Take 1 tablet (8 mg total) by mouth every 8 (eight) hours as needed for nausea or vomiting. Reported on 04/09/2016 30 tablet 0  . oxyCODONE (OXY IR/ROXICODONE) 5 MG immediate release tablet Take 1-2 tablets (5-10 mg total) by mouth every 4 (four) hours as needed for moderate pain. 40 tablet 0  . pantoprazole (PROTONIX) 40 MG tablet Take 1 tablet (40 mg total) by mouth as needed. (Patient taking differently: Take 40 mg by mouth daily as needed (for GERD). ) 30 tablet 5  . scopolamine (TRANSDERM-SCOP) 1 MG/3DAYS Place 1 patch (1.5 mg total) onto the skin every 3 (three) days. 10 patch 1  . valsartan-hydrochlorothiazide (DIOVAN-HCT) 160-12.5 MG tablet TAKE ONE (1) TABLET EACH DAY (Patient not taking: Reported on 04/09/2016) 30 tablet 1  . zolpidem (AMBIEN CR) 12.5 MG CR tablet Take 1 tablet (12.5 mg total) by mouth at bedtime as needed for sleep. 30 tablet 0   No current facility-administered medications for this visit.    Review of Systems  Constitutional: Positive for malaise/fatigue.       Has not felt good in a year and a half. No appetite  HENT: Negative.   Eyes: Negative.   Respiratory: Negative.  Negative for shortness of breath.   Cardiovascular: Negative.  Negative for chest pain.  Gastrointestinal: Positive for diarrhea.       Diarrhea handled with Immodium (took 3 today) No bloating  Genitourinary: Negative.   Musculoskeletal: Negative.   Skin: Negative.     Neurological: Negative.   Endo/Heme/Allergies: Negative.   Psychiatric/Behavioral: Negative.   All other systems reviewed and are negative.  14 point ROS was done and is otherwise as detailed above or in HPI   PHYSICAL EXAMINATION: ECOG PERFORMANCE STATUS: 1 - Symptomatic but completely ambulatory  Filed Vitals:   06/01/16 1009  BP: 151/94  Pulse: 72  Temp: 98.4 F (36.9 C)  Resp: 20   Filed Weights   06/01/16 1009  Weight: 206  lb 8 oz (93.668 kg)    Physical Exam  Constitutional: She is oriented to person, place, and time and well-developed, well-nourished, and in no distress.  HENT:  Head: Normocephalic and atraumatic.  Nose: Nose normal.  Mouth/Throat: Oropharynx is clear and moist. No oropharyngeal exudate.  Eyes: Conjunctivae and EOM are normal. Pupils are equal, round, and reactive to light. Right eye exhibits no discharge. Left eye exhibits no discharge. No scleral icterus.  Neck: Normal range of motion. Neck supple. No tracheal deviation present. No thyromegaly present.  Cardiovascular: Normal rate, regular rhythm and normal heart sounds.  Exam reveals no gallop and no friction rub.   No murmur heard. Pulmonary/Chest: Effort normal and breath sounds normal. She has no wheezes. She has no rales.  Abdominal: Soft. Bowel sounds are normal. She exhibits no distension and no mass. There is tenderness. There is no rebound and no guarding.  Tenderness on her right side, well healing surgical incision surgical site  Musculoskeletal: Normal range of motion. She exhibits no edema.  Lymphadenopathy:    She has no cervical adenopathy.  Neurological: She is alert and oriented to person, place, and time. She has normal reflexes. No cranial nerve deficit. Gait normal. Coordination normal.  Skin: Skin is warm and dry. No rash noted.  Psychiatric: Mood, memory, affect and judgment normal.  Nursing note and vitals reviewed.   LABORATORY DATA:  I have reviewed the data as  listed Lab Results  Component Value Date   WBC 7.4 05/27/2016   HGB 12.9 02/04/2016   HCT 34.5 05/27/2016   MCV 88 05/27/2016   PLT 265 05/27/2016   CMP     Component Value Date/Time   NA 146* 05/27/2016 0902   NA 137 02/12/2016 1530   K 3.2* 05/27/2016 0902   CL 104 05/27/2016 0902   CO2 25 05/27/2016 0902   GLUCOSE 93 05/27/2016 0902   GLUCOSE 93 02/12/2016 1530   BUN 9 05/27/2016 0902   BUN 18 02/12/2016 1530   CREATININE 0.93 05/27/2016 0902   CREATININE 1.01 03/28/2013 1621   CALCIUM 9.6 05/27/2016 0902   PROT 6.7 05/27/2016 0902   PROT 8.3* 02/12/2016 1530   ALBUMIN 4.2 05/27/2016 0902   ALBUMIN 4.5 02/12/2016 1530   AST 13 05/27/2016 0902   ALT 10 05/27/2016 0902   ALKPHOS 41 05/27/2016 0902   BILITOT 0.4 05/27/2016 0902   BILITOT 0.5 02/12/2016 1530   GFRNONAA 70 05/27/2016 0902   GFRNONAA 65 03/28/2013 1621   GFRAA 81 05/27/2016 0902   GFRAA 74 03/28/2013 1621    RADIOGRAPHIC STUDIES: I have personally reviewed the radiological images as listed and agreed with the findings in the report. Study Result     CLINICAL DATA: Right lower quadrant pain with nausea and vomiting. History of ruptured appendicitis last March  EXAM: CT ABDOMEN AND PELVIS WITH CONTRAST  TECHNIQUE: Multidetector CT imaging of the abdomen and pelvis was performed using the standard protocol following bolus administration of intravenous contrast.  CONTRAST: 135mL OMNIPAQUE IOHEXOL 300 MG/ML SOLN  COMPARISON: 10/29/2015  FINDINGS: Lung bases are clear. No effusions. Heart is normal size.  Mild diffuse fatty infiltration of the liver. Small layering high density in the gallbladder compatible with small stones. Spleen, pancreas, adrenals and kidneys are unremarkable.  Persistent fluid collection noted in the right lower quadrant at the base of the cecum and measures 4.8 x 4.2 cm compared to 5.4 x 4.6 cm previously. Moderate free fluid noted in the cul-de-sac of  the  pelvis. Bilateral ovarian cystic areas are noted, measuring 2.9 cm on the left, stable and 3.8 cm on the right (which is near the midline), also stable. Stomach, large and small bowel are unremarkable. Aorta is normal caliber. No acute bony abnormality or focal bone lesion.  IMPRESSION: Continued fluid collection near the base of the cecum concerning for recurrent/residual abscess. This is slightly smaller than prior study.  Bilateral ovarian cystic lesions again noted, stable. Moderate free fluid in the cul-de-sac of the pelvis also stable.  Mild diffuse fatty infiltration of the liver.  Cholelithiasis.   Electronically Signed  By: Rolm Baptise M.D.  On: 01/07/2016 11:09   Study Result     CLINICAL DATA: Right lower quadrant pain and nausea for 3 weeks, history of abscess drainage for perforated appendicitis  EXAM: CT ABDOMEN AND PELVIS WITH CONTRAST  TECHNIQUE: Multidetector CT imaging of the abdomen and pelvis was performed using the standard protocol following bolus administration of intravenous contrast.  CONTRAST: 181mL OMNIPAQUE IOHEXOL 300 MG/ML SOLN  COMPARISON: 08/07/2015 and 08/06/2015  FINDINGS: Lung bases shows stable 5 mm nodule in right middle lobe.  Sagittal images of the spine shows mild degenerative changes lower thoracic spine.  Enhanced liver, pancreas, spleen and adrenal glands are unremarkable. Small accessory splenule. No calcified gallstones are noted within gallbladder. Enhanced kidneys are symmetrical in size. No hydronephrosis or hydroureter. Tiny umbilical hernia containing fat without evidence of acute complication.  Delayed renal images shows bilateral renal symmetrical excretion. Bilateral visualized proximal ureter is unremarkable.  Again noted small to moderate free fluid within posterior pelvis. The uterus is unremarkable.  There is a recurrent fluid collection in the region of the  appendix measures 5.6 x 4.6 cm. Measures 23.5 Hounsfield units in attenuation. This is suspicious for recurrent abscess. The appendix is not identified. There is contrast opacification of the cecum. There is tiny amount of contrast at the base of the appendix in axial image 69. There is no evidence of contrast extravasation. There is no evidence of contrast material within the collection.  The terminal ileum is unremarkable.  No small bowel obstruction. The uterus is unremarkable. Stable cystic structure within left ovary measures about 2.7 cm. No distal colonic obstruction. The urinary bladder is unremarkable. No inguinal adenopathy.  IMPRESSION: 1. There is a complex collection in right lower quadrant in the region of the appendix measures 5.6 x 4.6 cm. This is suspicious for recurrent inflammatory process or abscess. There is no evidence of contrast extravasation or contrast material entering the collection. The appendix is not identified. Unremarkable terminal ileum. No significant pericecal stranding. 2. No small bowel obstruction. 3. Again noted small to moderate pelvic free fluid within posterior cul-de-sac. Stable cystic structure within left ovary measures 2.7 cm. 4. No hydronephrosis or hydroureter.   Electronically Signed  By: Lahoma Crocker M.D.  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 [name] No prior C-scope Bilateral cystic lesions HIPEC/CRS Folic acid deficiency  I will see her back in several weeks for reassessment.  She is currently on an SSRI.  We discussed trying EFFEXOR if her hot flashes continue.  We discussed her upcoming cruise in a few weeks and I would concur with her husband that she stay closer to the states, she is far from 100%. I have encouraged her to increase her physical activity.  I have  called in her folate 1 mg to take daily for 1 month. After completion of this prescription she can take a B complex moving forward.  Additional imaging will initially be performed at [facility].  We will get formal copies of her final path and operative report for our records.   All questions were answered. The patient knows to call the clinic with any problems, questions or concerns.  This note was electronically signed.  Molli Hazard, MD  06/01/2016 1:10 PM

## 2016-06-08 ENCOUNTER — Other Ambulatory Visit: Payer: Self-pay | Admitting: Nurse Practitioner

## 2016-06-23 ENCOUNTER — Encounter (HOSPITAL_COMMUNITY): Payer: Self-pay | Admitting: Hematology & Oncology

## 2016-06-23 ENCOUNTER — Encounter (HOSPITAL_COMMUNITY): Payer: Commercial Managed Care - HMO | Attending: Hematology & Oncology | Admitting: Hematology & Oncology

## 2016-06-23 VITALS — BP 122/68 | HR 65 | Temp 98.2°F | Resp 18 | Wt 198.8 lb

## 2016-06-23 DIAGNOSIS — D373 Neoplasm of uncertain behavior of appendix: Secondary | ICD-10-CM

## 2016-06-23 DIAGNOSIS — E538 Deficiency of other specified B group vitamins: Secondary | ICD-10-CM

## 2016-06-23 DIAGNOSIS — C181 Malignant neoplasm of appendix: Secondary | ICD-10-CM | POA: Diagnosis not present

## 2016-06-23 DIAGNOSIS — D490 Neoplasm of unspecified behavior of digestive system: Secondary | ICD-10-CM | POA: Insufficient documentation

## 2016-06-23 DIAGNOSIS — G4700 Insomnia, unspecified: Secondary | ICD-10-CM | POA: Diagnosis not present

## 2016-06-23 DIAGNOSIS — R197 Diarrhea, unspecified: Secondary | ICD-10-CM | POA: Diagnosis not present

## 2016-06-23 DIAGNOSIS — R110 Nausea: Secondary | ICD-10-CM | POA: Diagnosis not present

## 2016-06-23 MED ORDER — ZOLPIDEM TARTRATE ER 12.5 MG PO TBCR: 12.5000 mg | EXTENDED_RELEASE_TABLET | Freq: Every evening | ORAL | 1 refills | Status: DC | PRN

## 2016-06-23 MED ORDER — ONDANSETRON HCL 8 MG PO TABS: 8.0000 mg | ORAL_TABLET | Freq: Three times a day (TID) | ORAL | 0 refills | Status: DC | PRN

## 2016-06-23 MED ORDER — OXYCODONE HCL 5 MG PO TABS
5.0000 mg | ORAL_TABLET | ORAL | 0 refills | Status: DC | PRN
Start: 2016-06-23 — End: 2016-09-20

## 2016-06-23 NOTE — Patient Instructions (Signed)
[place] at [facility] Discharge Instructions  RECOMMENDATIONS MADE BY THE CONSULTANT AND ANY TEST RESULTS WILL BE SENT TO YOUR REFERRING PHYSICIAN.  Return in 8 weeks for follow up   Thank you for choosing [place] at [facility] to provide your oncology and hematology care.  To afford each patient quality time with our provider, please arrive at least 15 minutes before your scheduled appointment time.   Beginning January 23rd 2017 lab work for the [facility] will be done in the  Main lab at [facility] on 1st floor. If you have a lab appointment with the [facility] please come in thru the  Main Entrance and check in at the main information desk  You need to re-schedule your appointment should you arrive 10 or more minutes late.  We strive to give you quality time with our providers, and arriving late affects you and other patients whose appointments are after yours.  Also, if you no show three or more times for appointments you may be dismissed from the clinic at the providers discretion.     Again, thank you for choosing [facility].  Our hope is that these requests will decrease the amount of time that you wait before being seen by our physicians.       _____________________________________________________________  Should you have questions after your visit to [facility], please contact our office at [phone] between the hours of 8:30 a.m. and 4:30 p.m.  Voicemails left after 4:30 p.m. will not be returned until the following business day.  For prescription refill requests, have your pharmacy contact our office.         Resources For Cancer Patients and their Caregivers ? American Cancer Society: Can assist with transportation, wigs, general needs, runs Look Good Feel Better.        ([phone] ? Cancer Care: Provides financial assistance, online support groups, medication/co-pay assistance.   1-800-813-HOPE (770)620-0296) ? [facility] Assists [place] cancer patients and their families through emotional , educational and financial support.  949 358 3117 ? [place] DSS Where to apply for food stamps, Medicaid and utility assistance. 917-344-2109 ? [facility]: Transportation to medical appointments. 437 448 7503 ? Social Security Administration: May apply for disability if have a Stage IV cancer. 986-413-3885 2501875422 ? [employer], Disability and Transit Services: Assists with nutrition, care and transit needs. [facility] Support Programs: @10RELATIVEDAYS @ > Cancer Support Group  2nd Tuesday of the month 1pm-2pm, Journey Room  > Creative Journey  3rd Tuesday of the month 1130am-1pm, Journey Room  > Look Good Feel Better  1st Wednesday of the month 10am-12 noon, Journey Room (Call [facility] to register [phone])

## 2016-06-23 NOTE — Progress Notes (Signed)
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 [name] No prior C-scope  HISTORY OF PRESENTING ILLNESS:  Casey [name] 55 y.o. 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 She has undergone CRS/HIPEC at [facility] with Dr. Dub Amis. I do not have all final pathology but some details are noted below.  She had evidence of disease found at the time of surgery.    Mrs. Pilgrim is accompanied by her husband.   She is feeling better. His husband believes she is doing a little better as well. She has been more active, doing more things around the house and walking outside the house. His husband remarks they were out a while yesterday so she is a little sore today. She admits she was exhausted with they got home.  She still has some days where she feels poorly, noting this past Friday and Saturday she felt so bad she couldn't lift her head off the couch.   She reports nausea that flares up every once in a while. This has improved and is not as severe. She is taking Protonix once daily. She discontinued the Protonix for one week and experienced abdominal cramping so she restarted it. She denies vomiting.  She continues to experience diarrhea. When she takes imodium it helps the following day but she feels very bloated. She will take imodium if she is planning on going out and about, "To try and slow it down". She had one episode of solid stool and has noticed some days are not as bad as others. Her diarrhea is worse after eating. She notes that yesterday her diarrhea was not as bad but attributes this to waiting until later in the day to eat.   She occasionally experiences pain where they removed her  ovaries but associates this with the healing process.   She is going on a cruise with her family this weekend. She does not plan on going on any of the excursions, and will stay on the boat the whole time with someone.   She was originally scheduled to go back to work on the 16th but does not think she is ready. She is requesting a doctor's note to reflect this.   MEDICAL HISTORY:  Past Medical History:  Diagnosis Date  . Anemia   . Anxiety   . Cancer ([place])    premelanoma per hip   . Depression   . DUB (dysfunctional uterine bleeding)   . Dysrhythmia    pt states has PVCs   . GERD (gastroesophageal reflux disease)   . Graves disease   . History of blood transfusion   . History of urinary tract infection    last one approx 4 years ago   . Hyperlipidemia   . Hypertension   . Migraines   . Palpitations   . Pneumonia    hx of had approx 22 years ago   . PONV (postoperative nausea and vomiting)   . Pulmonary nodule     SURGICAL HISTORY: Past Surgical History:  Procedure Laterality Date  . APPENDECTOMY    . DIAGNOSTIC LAPAROSCOPY    . DILATION AND CURETTAGE OF UTERUS    . infertility testing     . LAPAROSCOPIC PARTIAL COLECTOMY N/A 02/02/2016   Procedure: LAPAROSCOPIC PARTIAL COlectomy right  colon;  Surgeon: Greer Pickerel, MD;  Location: [facility];  Service: General;  Laterality: N/A;  . TONSILLECTOMY    . VAGINAL HYSTERECTOMY    . VESICOVAGINAL FISTULA CLOSURE W/ TAH     Dr. Margaretha Glassing  2 degree DUB     SOCIAL HISTORY: Social History   Social History  . Marital status: Married    Spouse name: N/A  . Number of children: N/A  . Years of education: N/A   Occupational History  . Not on file.   Social History Main Topics  . Smoking status: Never Smoker  . Smokeless tobacco: Never Used  . Alcohol use No  . Drug use: No  . Sexual activity: Yes    Partners: Male    Birth control/ protection: Surgical     Comment: TVH   Other Topics Concern  . Not on file   Social  History Narrative  . 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 Hypertension Mother   . Irregular heart beat Mother   . Thyroid disease Maternal Grandmother   . Cancer Maternal Grandmother     lung  . Cancer Paternal Grandfather     lung    ALLERGIES:  is allergic to labetalol; morphine and related; other; and zosyn [piperacillin sod-tazobactam so].  MEDICATIONS:  Current Outpatient Prescriptions  Medication Sig Dispense Refill  . cetirizine (ZYRTEC) 10 MG tablet TAKE ONE (1) TABLET EACH DAY 30 tablet 9  . escitalopram (LEXAPRO) 10 MG tablet TAKE ONE (1) TABLET EACH DAY 30 tablet 1  . fenofibrate 160 MG tablet TAKE ONE (1) TABLET EACH DAY 30 tablet 5  . folic acid (FOLVITE) 1 MG tablet Take 1 tablet (1 mg total) by mouth daily. 30 tablet 1  . levothyroxine (SYNTHROID, LEVOTHROID) 75 MCG tablet TAKE ONE (1) TABLET EACH DAY 30 tablet 1  . metoprolol succinate (TOPROL-XL) 50 MG 24 hr tablet TAKE ONE (1) TABLET EACH DAY 30 tablet 5  . nystatin cream (MYCOSTATIN) Apply 1 application topically 2 (two) times daily. 30 g 0  . pantoprazole (PROTONIX) 40 MG tablet Take 1 tablet (40 mg total) by mouth as needed. (Patient taking differently: Take 40 mg by mouth daily as needed (for GERD). ) 30 tablet 5  . scopolamine (TRANSDERM-SCOP) 1 MG/3DAYS Place 1 patch (1.5 mg total) onto the skin every 3 (three) days. 10 patch 1  . valsartan-hydrochlorothiazide (DIOVAN-HCT) 160-12.5 MG tablet TAKE ONE (1) TABLET EACH DAY 30 tablet 5  . fluconazole (DIFLUCAN) 150 MG tablet 1 po q week x 4 weeks (Patient not taking: Reported on  06/23/2016) 4 tablet 0  . ondansetron (ZOFRAN) 8 MG tablet Take 1 tablet (8 mg total) by mouth every 8 (eight) hours as needed for nausea or vomiting. Reported on 04/09/2016 (Patient not taking: Reported on 06/23/2016) 30 tablet 0  . oxyCODONE (OXY IR/ROXICODONE) 5 MG immediate release tablet Take 1-2 tablets (5-10 mg total) by mouth every 4 (four) hours as needed for moderate pain. (Patient not taking: Reported on 06/23/2016) 40 tablet 0  . zolpidem (AMBIEN CR) 12.5 MG CR  tablet Take 1 tablet (12.5 mg total) by mouth at bedtime as needed for sleep. (Patient not taking: Reported on 06/23/2016) 30 tablet 0   No current facility-administered medications for this visit.     Review of Systems  HENT: Negative.   Eyes: Negative.   Respiratory: Negative.  Negative for shortness of breath.   Cardiovascular: Negative.  Negative for chest pain.  Gastrointestinal: Positive for diarrhea and nausea. Negative for vomiting.       Persistent diarrhea Intermittent nausea (improved)  Genitourinary: Negative.   Musculoskeletal: Negative.   Skin: Negative.   Neurological: Negative.   Endo/Heme/Allergies: Negative.   Psychiatric/Behavioral: Negative.   All other systems reviewed and are negative.  14 point ROS was done and is otherwise as detailed above or in HPI   PHYSICAL EXAMINATION: ECOG PERFORMANCE STATUS: 1 - Symptomatic but completely ambulatory  Vitals:   06/23/16 1035  BP: 122/68  Pulse: 65  Resp: 18  Temp: 98.2 F (36.8 C)   Filed Weights   06/23/16 1035  Weight: 198 lb 12.8 oz (90.2 kg)    Physical Exam  Constitutional: She is oriented to person, place, and time and well-developed, well-nourished, and in no distress.  HENT:  Head: Normocephalic and atraumatic.  Nose: Nose normal.  Mouth/Throat: Oropharynx is clear and moist. No oropharyngeal exudate.  Eyes: Conjunctivae and EOM are normal. Pupils are equal, round, and reactive to light. Right eye exhibits no discharge. Left eye exhibits  no discharge. No scleral icterus.  Neck: Normal range of motion. Neck supple. No tracheal deviation present. No thyromegaly present.  Cardiovascular: Normal rate, regular rhythm and normal heart sounds.  Exam reveals no gallop and no friction rub.   No murmur heard. Pulmonary/Chest: Effort normal and breath sounds normal. She has no wheezes. She has no rales.  Abdominal: Soft. Bowel sounds are normal. She exhibits no distension and no mass. There is tenderness. There is no rebound and no guarding.  Tenderness on her right side, well healing surgical incision surgical site  Musculoskeletal: Normal range of motion. She exhibits no edema.  Lymphadenopathy:    She has no cervical adenopathy.  Neurological: She is alert and oriented to person, place, and time. She has normal reflexes. No cranial nerve deficit. Gait normal. Coordination normal.  Skin: Skin is warm and dry. No rash noted.  Psychiatric: Mood, memory, affect and judgment normal.  Nursing note and vitals reviewed.   LABORATORY DATA:  I have reviewed the data as listed Lab Results  Component Value Date   WBC 7.4 05/27/2016   HGB 12.9 02/04/2016   HCT 34.5 05/27/2016   MCV 88 05/27/2016   PLT 265 05/27/2016   CMP     Component Value Date/Time   NA 146 (H) 05/27/2016 0902   K 3.2 (L) 05/27/2016 0902   CL 104 05/27/2016 0902   CO2 25 05/27/2016 0902   GLUCOSE 93 05/27/2016 0902   GLUCOSE 93 02/12/2016 1530   BUN 9 05/27/2016 0902   CREATININE 0.93 05/27/2016 0902   CREATININE 1.01 03/28/2013 1621   CALCIUM 9.6 05/27/2016 0902   PROT 6.7 05/27/2016 0902   ALBUMIN 4.2 05/27/2016 0902   AST 13 05/27/2016 0902   ALT 10 05/27/2016 0902   ALKPHOS 41 05/27/2016 0902   BILITOT 0.4 05/27/2016 0902   GFRNONAA 70 05/27/2016 0902   GFRNONAA 65 03/28/2013 1621   GFRAA 81 05/27/2016 0902   GFRAA 74 03/28/2013 1621    RADIOGRAPHIC STUDIES: I have personally reviewed the radiological  images as listed and agreed with the  findings in the report. Study Result   CLINICAL DATA:  History of Graves disease, right nodule noted on recent CT chest  EXAM: THYROID ULTRASOUND  TECHNIQUE: Ultrasound examination of the thyroid gland and adjacent soft tissues was performed.  COMPARISON:  CT 02/18/2016  FINDINGS: Right thyroid lobe  Measurements: 4.9 x 2 x 2 cm. 1.9 x 1.6 x 1.5 cm hypoechoic solid nodule, mid lobe. 0.8 x 0.6 x 0.7 cm solid nodule, inferior pole.  Left thyroid lobe  Measurements: 5 x 1.5 x 1.6 cm. Hyperemic somewhat inhomogeneous parenchyma without focal lesion.  Isthmus  Thickness: 0.4 cm.  No nodules visualized.  Lymphadenopathy  None visualized.  IMPRESSION: 1. Mild thyromegaly with right nodules. The 19 mm lesion meets consensus criteria for biopsy. Ultrasound-guided fine needle aspiration should be considered, as per the consensus statement: Management of Thyroid Nodules Detected at [place]: Society of Radiologists in [place]. Radiology 2005; [id number].   Electronically Signed   By: Lucrezia Europe M.D.   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 [name] No prior C-scope Bilateral cystic lesions HIPEC/CRS insomnia Folic acid deficiency  Additional imaging will initially be performed at [facility]    She was originally scheduled to return to work on the 16th but does not think she is ready. She is requesting a doctor's note to reflect this.  Taking Protonix daily with improved nausea. Appetite is improving. She is increasing her activity. Persistent intermittent diarrhea, but unchanged since original surgery.   She will return for follow up in 2 months.  All questions were answered. The patient knows to call the clinic with any problems, questions or concerns.  This document serves  as a record of services personally performed by Ancil Linsey, MD. It was created on her behalf by Arlyce Harman, a trained medical scribe. The creation of this record is based on the scribe's personal observations and the provider's statements to them. This document has been checked and approved by the attending provider.  I have reviewed the above documentation for accuracy and completeness, and I agree with the above.  This note was electronically signed.  Molli Hazard, MD  06/23/2016 10:55 AM

## 2016-06-25 ENCOUNTER — Encounter (HOSPITAL_COMMUNITY): Payer: Self-pay | Admitting: Hematology & Oncology

## 2016-06-25 ENCOUNTER — Other Ambulatory Visit: Payer: Self-pay | Admitting: Nurse Practitioner

## 2016-06-25 MED ORDER — AZITHROMYCIN 250 MG PO TABS: ORAL_TABLET | ORAL | 0 refills | Status: DC

## 2016-07-08 ENCOUNTER — Other Ambulatory Visit: Payer: Self-pay | Admitting: Nurse Practitioner

## 2016-07-08 ENCOUNTER — Encounter: Payer: Self-pay | Admitting: Certified Nurse Midwife

## 2016-07-08 ENCOUNTER — Other Ambulatory Visit (HOSPITAL_COMMUNITY): Payer: Self-pay | Admitting: Hematology & Oncology

## 2016-07-08 ENCOUNTER — Ambulatory Visit: Payer: Commercial Managed Care - HMO | Admitting: Certified Nurse Midwife

## 2016-07-08 ENCOUNTER — Ambulatory Visit
Admission: RE | Admit: 2016-07-08 | Discharge: 2016-07-08 | Disposition: A | Discharge status: Home / Self Care | Payer: Commercial Managed Care - HMO | Source: Ambulatory Visit | Attending: Nurse Practitioner | Admitting: Nurse Practitioner

## 2016-07-08 ENCOUNTER — Ambulatory Visit: Payer: Commercial Managed Care - HMO

## 2016-07-08 VITALS — BP 110/70 | HR 70 | Resp 16 | Ht 69.75 in | Wt 202.0 lb

## 2016-07-08 DIAGNOSIS — Z1231 Encounter for screening mammogram for malignant neoplasm of breast: Secondary | ICD-10-CM

## 2016-07-08 DIAGNOSIS — Z01419 Encounter for gynecological examination (general) (routine) without abnormal findings: Secondary | ICD-10-CM | POA: Diagnosis not present

## 2016-07-08 IMAGING — MG DIGITAL SCREENING BILATERAL MAMMOGRAM WITH CAD
6 series · 6 of 6 positions shown · non-contrast
Comparison: Previous exam(s).

CLINICAL DATA: Screening.

EXAM:
DIGITAL SCREENING BILATERAL MAMMOGRAM WITH CAD

[R MLO (1 of 2)]
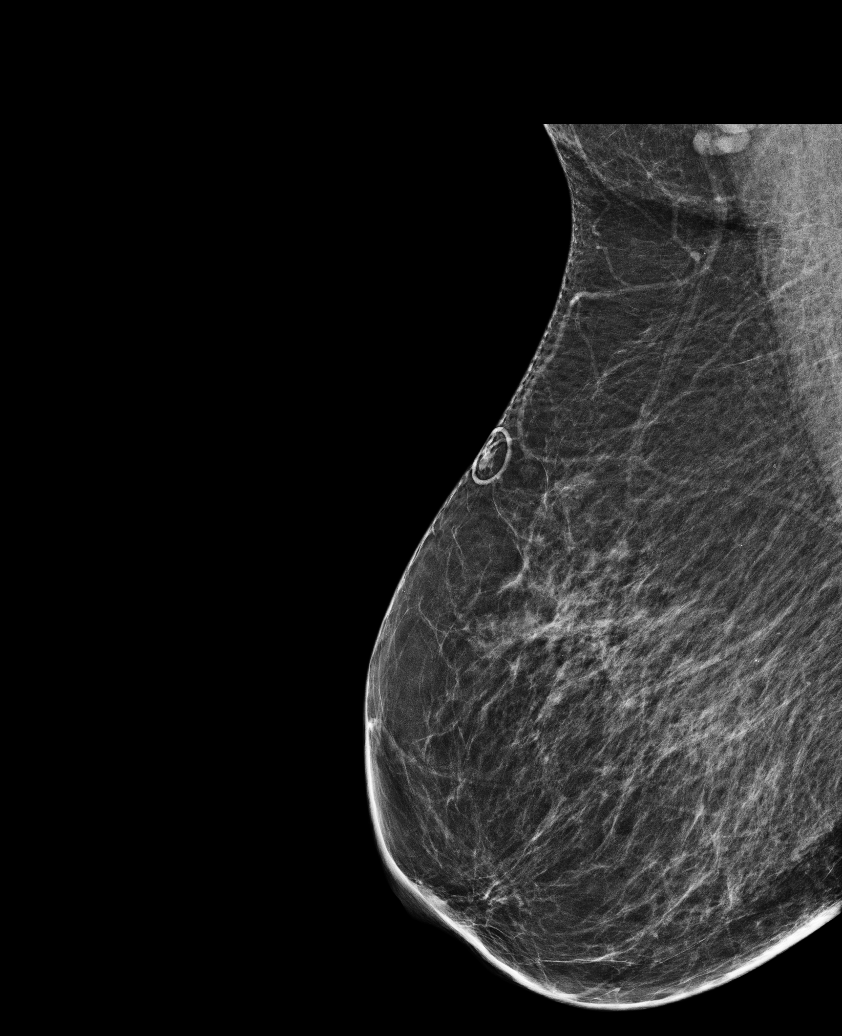

[L CC]
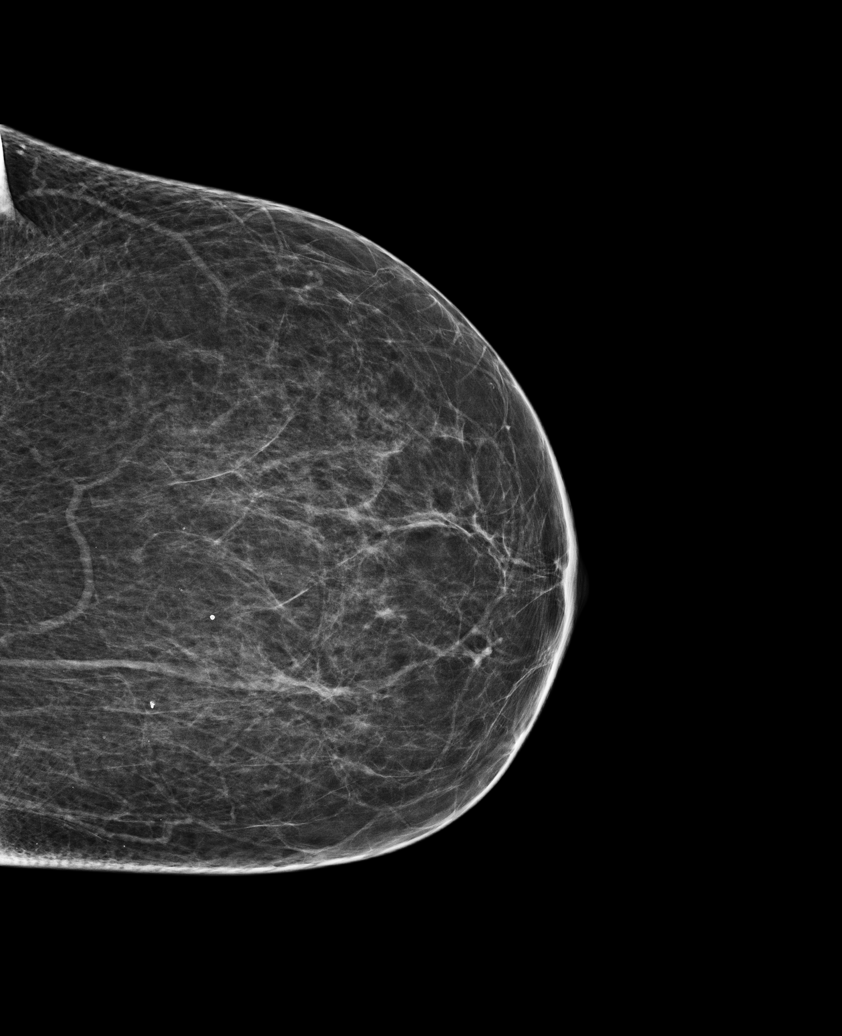

[L MLO (1 of 2)]
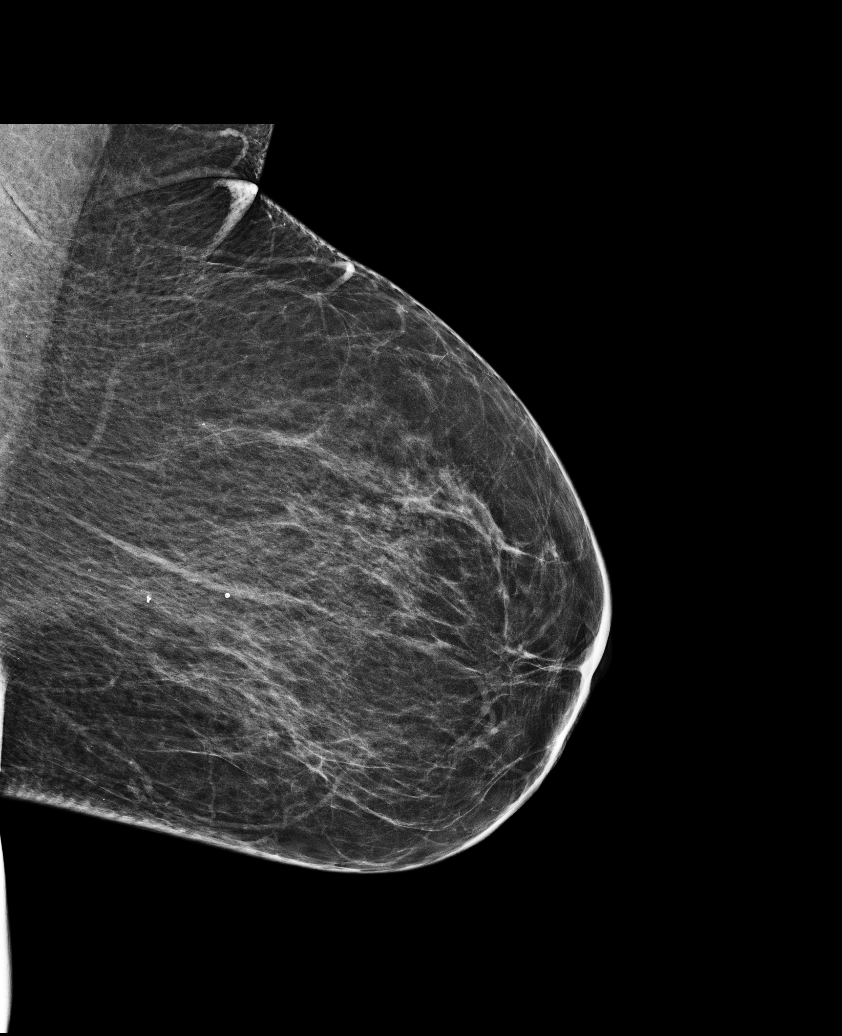

[L MLO (2 of 2)]
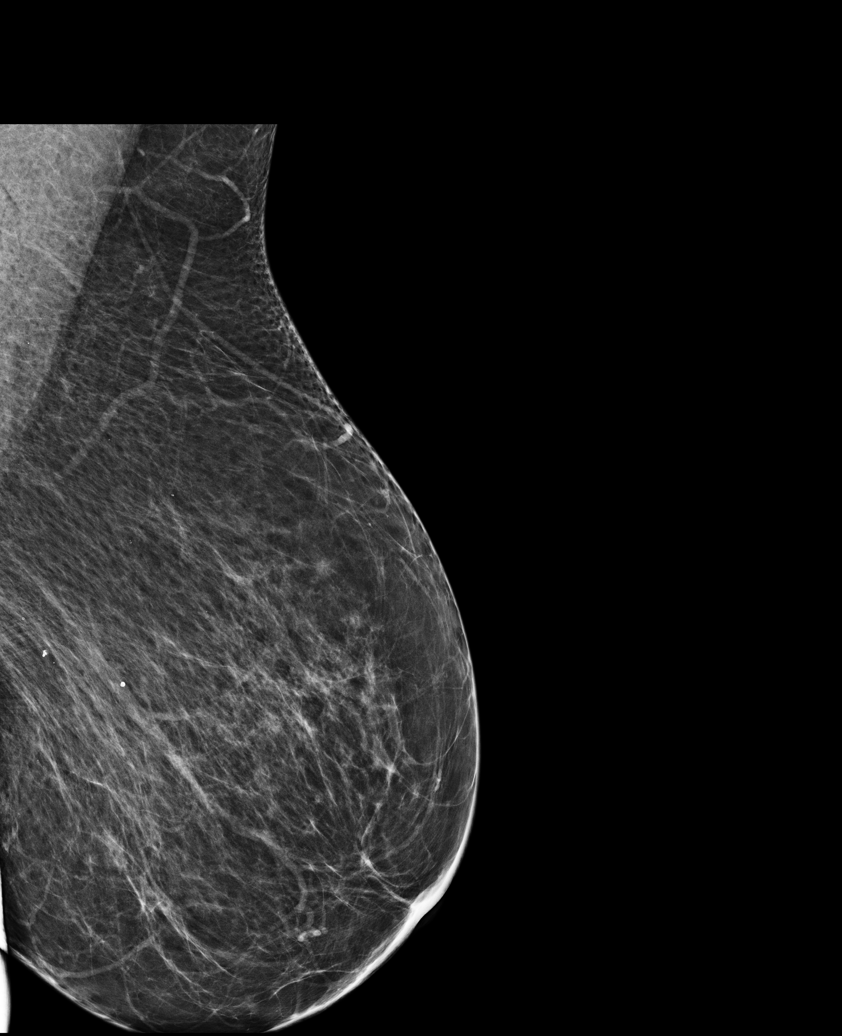

[R CC]
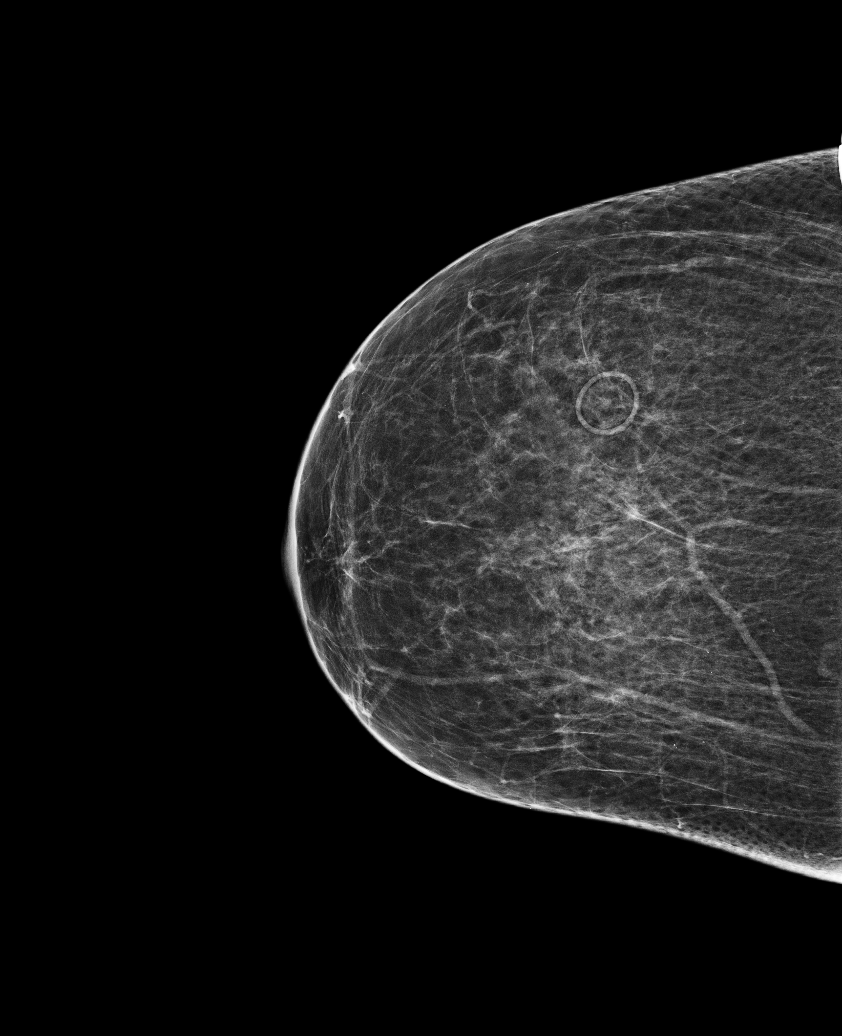

[R MLO (2 of 2)]
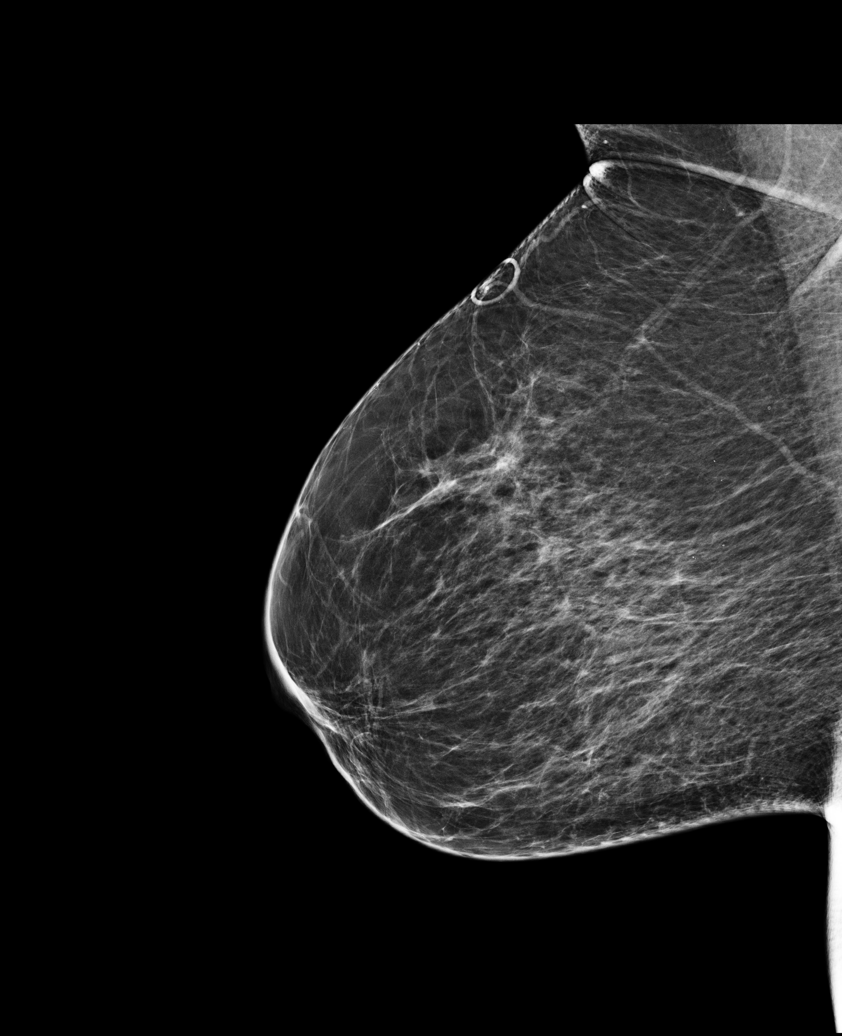

[6 of 6 positions shown; findings below may reference images not displayed]

ACR Breast Density Category b: There are scattered areas of
fibroglandular density.
FINDINGS: There are no findings suspicious for malignancy. Images were
processed with CAD.
IMPRESSION: No mammographic evidence of malignancy. A result letter of this
screening mammogram will be mailed directly to the patient.

RECOMMENDATION:
Screening mammogram in one year. (Code:[US])

BI-RADS CATEGORY  1: Negative.

## 2016-07-08 NOTE — Progress Notes (Signed)
55 y.o. [id number] Married  Caucasian Fe here for annual exam. Menopausal no HRT. Denies vaginal bleeding or vaginal dryness. Patient diagnosed with mucoid appendix carcinoid with colon/intestinal involved. Had partial colon and intestinal area removal and BSO with salpingectomy. Had heated chemotherapy at time of surgery at [facility]. Doing well. Has PET scan in 9/17. Continues on medical leave. Recent trip on a cruise and did well.  Has lost 40 pounds in the past 3 months. Sees Dr.Martin PCP manages hypothyroid, hypertension and labs too. Oncology managing other medication. Bowel movements still not normal. Energy and emotional health improving.  No other health issues today.  Patient's last menstrual period was 05/23/1995.          Sexually active: No.  The current method of family planning is status post hysterectomy.    Exercising: No.  exercise Smoker:  no  Health Maintenance: Pap: 05-31-10 neg MMG: 07-04-15 category b density birads 1:neg Colonoscopy:  none BMD:   none TDaP:  2016 Shingles: no Pneumonia: no Hep C and HIV:  HIV done yrs ago Labs: none Self breast exam: not done   reports that she has never smoked. She has never used smokeless tobacco. She reports that she does not drink alcohol or use drugs.  Past Medical History:  Diagnosis Date  . Anemia   . Anxiety   . Cancer ([place])    premelanoma per hip   . Depression   . DUB (dysfunctional uterine bleeding)   . Dysrhythmia    pt states has PVCs   . GERD (gastroesophageal reflux disease)   . Graves disease   . History of blood transfusion   . History of urinary tract infection    last one approx 4 years ago   . Hyperlipidemia   . Hypertension   . Migraines   . Palpitations   . Pneumonia    hx of had approx 22 years ago   . PONV (postoperative nausea and vomiting)   . Pulmonary nodule     Past Surgical History:  Procedure Laterality Date  . APPENDECTOMY    . DIAGNOSTIC LAPAROSCOPY    . DILATION AND CURETTAGE OF  UTERUS    . infertility testing     . LAPAROSCOPIC PARTIAL COLECTOMY N/A 02/02/2016   Procedure: LAPAROSCOPIC PARTIAL COlectomy right colon;  Surgeon: Greer Pickerel, MD;  Location: [facility];  Service: General;  Laterality: N/A;  . TONSILLECTOMY    . VAGINAL HYSTERECTOMY    . VESICOVAGINAL FISTULA CLOSURE W/ TAH     Dr. Margaretha Glassing  2 degree DUB     Current Outpatient Prescriptions  Medication Sig Dispense Refill  . acetaminophen (TYLENOL) 500 MG tablet Take 500 mg by mouth 3 times/day as needed-between meals & bedtime.    . cetirizine (ZYRTEC) 10 MG tablet TAKE ONE (1) TABLET EACH DAY 30 tablet 9  . escitalopram (LEXAPRO) 10 MG tablet TAKE ONE (1) TABLET EACH DAY 30 tablet 1  . fenofibrate 160 MG tablet TAKE ONE (1) TABLET EACH DAY 30 tablet 5  . folic acid (FOLVITE) 1 MG tablet Take 1 tablet (1 mg total) by mouth daily. 30 tablet 1  . Ibuprofen 200 MG CAPS Take 200 mg by mouth 3 times/day as needed-between meals & bedtime.    Marland Kitchen levothyroxine (SYNTHROID, LEVOTHROID) 75 MCG tablet TAKE ONE (1) TABLET EACH DAY 30 tablet 1  . metoprolol succinate (TOPROL-XL) 50 MG 24 hr tablet TAKE ONE (1) TABLET EACH DAY 30 tablet 5  . ondansetron (ZOFRAN) 8  MG tablet Take 1 tablet (8 mg total) by mouth every 8 (eight) hours as needed for nausea or vomiting. Reported on 04/09/2016 30 tablet 0  . oxyCODONE (OXY IR/ROXICODONE) 5 MG immediate release tablet Take 1 tablet (5 mg total) by mouth every 4 (four) hours as needed for moderate pain. 60 tablet 0  . pantoprazole (PROTONIX) 40 MG tablet Take 1 tablet (40 mg total) by mouth as needed. (Patient taking differently: Take 40 mg by mouth daily as needed (for GERD). ) 30 tablet 5  . valsartan-hydrochlorothiazide (DIOVAN-HCT) 160-12.5 MG tablet TAKE ONE (1) TABLET EACH DAY 30 tablet 5  . zolpidem (AMBIEN CR) 12.5 MG CR tablet Take 1 tablet (12.5 mg total) by mouth at bedtime as needed for sleep. 30 tablet 1   No current facility-administered medications for this visit.      Family History  Problem Relation Age of Onset  . Hypertension Mother   . Irregular heart beat Mother   . Thyroid disease Maternal Grandmother   . Cancer Maternal Grandmother     lung  . Cancer Paternal Grandfather     lung    ROS:  Pertinent items are noted in HPI.  Otherwise, a comprehensive ROS was negative.  Exam:   BP 110/70   Pulse 70   Resp 16   Ht 5' 9.75" (1.772 m)   Wt 202 lb (91.6 kg)   LMP 05/23/1995   BMI 29.19 kg/m  Height: 5' 9.75" (177.2 cm) Ht Readings from Last 3 Encounters:  07/08/16 5' 9.75" (1.772 m)  06/01/16 5' 10.6" (1.793 m)  05/27/16 5\' 10"  (1.778 m)    General appearance: alert, cooperative and appears stated age Head: Normocephalic, without obvious abnormality, atraumatic Neck: no adenopathy, supple, symmetrical, trachea midline and thyroid enlarged and nodule palpated on right(known) Lungs: clear to auscultation bilaterally Breasts: normal appearance, no masses or tenderness Heart: regular rate and rhythm Abdomen: soft, non-tender; no masses,  no organomegaly Extremities: extremities normal, atraumatic, no cyanosis or edema Skin: Skin color, texture, turgor normal. No rashes or lesions Lymph nodes: Cervical, supraclavicular, and axillary nodes normal. No abnormal inguinal nodes palpated Neurologic: Grossly normal   Pelvic: External genitalia:  no lesions              Urethra:  normal appearing urethra with no masses, tenderness or lesions              Bartholin's and Skene's: normal                 Vagina: normal appearing vagina with normal color and discharge, no lesions              Cervix: absent              Pap taken: No. Bimanual Exam:  Uterus:  uterus absent              Adnexa: no mass, fullness, tenderness and adnexa absent               Rectovaginal: Confirms               Anus:  normal sphincter tone, no lesions  Chaperone present: yes  A:  Well Woman with normal exam  Menopausal no HRT s/p TVH due to  prolapse  Recent diagnosis and surgery for appendix carcinoid with colon involvement with BSO/salpingectomy surgery also, under oncology  Mammogram due  Hypertension/hypothyroid management with MD, recent nodule biopsy negative  P:   Reviewed health and wellness  pertinent to exam  Continue with MD follow up as indicated.  Discussed mammogram due, patient has scheduled. Aware of importance.  Pap smear as above not taken   counseled on breast self exam, mammography screening, adequate intake of calcium and vitamin D, diet and exercise as tolerated. Wished well with her continued recovery  return annually or prn  An After Visit Summary was printed and given to the patient.

## 2016-07-08 NOTE — Patient Instructions (Signed)

## 2016-07-10 NOTE — Progress Notes (Signed)
Encounter reviewed Manali Mcelmurry, MD   

## 2016-07-15 ENCOUNTER — Encounter (HOSPITAL_COMMUNITY): Payer: Self-pay | Admitting: Hematology & Oncology

## 2016-07-23 ENCOUNTER — Other Ambulatory Visit (HOSPITAL_COMMUNITY): Payer: Self-pay

## 2016-07-23 DIAGNOSIS — G4700 Insomnia, unspecified: Secondary | ICD-10-CM

## 2016-07-23 MED ORDER — ZOLPIDEM TARTRATE ER 12.5 MG PO TBCR: 12.5000 mg | EXTENDED_RELEASE_TABLET | Freq: Every evening | ORAL | 1 refills | Status: DC | PRN

## 2016-07-23 NOTE — Telephone Encounter (Signed)
Received refill request from patients pharmacy. Chart checked and refilled per protocol.

## 2016-07-24 ENCOUNTER — Encounter (HOSPITAL_COMMUNITY): Payer: Self-pay | Admitting: Hematology & Oncology

## 2016-07-30 ENCOUNTER — Other Ambulatory Visit: Payer: Self-pay | Admitting: Nurse Practitioner

## 2016-07-30 MED ORDER — NITROFURANTOIN MONOHYD MACRO 100 MG PO CAPS: 100.0000 mg | ORAL_CAPSULE | Freq: Two times a day (BID) | ORAL | 0 refills | Status: DC

## 2016-07-30 NOTE — Progress Notes (Signed)
macrobid rx sent to pharmacy- patient to see form chemo to come to office.

## 2016-08-19 ENCOUNTER — Encounter (HOSPITAL_COMMUNITY): Payer: Commercial Managed Care - HMO | Attending: Hematology & Oncology | Admitting: Oncology

## 2016-08-19 ENCOUNTER — Encounter (HOSPITAL_COMMUNITY): Payer: Self-pay | Admitting: Oncology

## 2016-08-19 VITALS — BP 120/69 | HR 63 | Temp 97.9°F | Resp 16 | Wt 201.3 lb

## 2016-08-19 DIAGNOSIS — D490 Neoplasm of unspecified behavior of digestive system: Secondary | ICD-10-CM | POA: Insufficient documentation

## 2016-08-19 DIAGNOSIS — R1032 Left lower quadrant pain: Secondary | ICD-10-CM | POA: Diagnosis not present

## 2016-08-19 DIAGNOSIS — D373 Neoplasm of uncertain behavior of appendix: Secondary | ICD-10-CM

## 2016-08-19 DIAGNOSIS — R197 Diarrhea, unspecified: Secondary | ICD-10-CM

## 2016-08-19 DIAGNOSIS — C181 Malignant neoplasm of appendix: Secondary | ICD-10-CM

## 2016-08-19 DIAGNOSIS — G4700 Insomnia, unspecified: Secondary | ICD-10-CM | POA: Insufficient documentation

## 2016-08-19 DIAGNOSIS — R42 Dizziness and giddiness: Secondary | ICD-10-CM | POA: Diagnosis not present

## 2016-08-19 MED ORDER — ZOLPIDEM TARTRATE ER 12.5 MG PO TBCR: 12.5000 mg | EXTENDED_RELEASE_TABLET | Freq: Every evening | ORAL | 1 refills | Status: DC | PRN

## 2016-08-19 MED ORDER — FOLIC ACID 1 MG PO TABS: ORAL_TABLET | ORAL | 5 refills | Status: DC

## 2016-08-19 NOTE — Patient Instructions (Signed)
[place] at [facility] Discharge Instructions  RECOMMENDATIONS MADE BY THE CONSULTANT AND ANY TEST RESULTS WILL BE SENT TO YOUR REFERRING PHYSICIAN.  You were seen by Gershon Mussel today. Prescriptions for Ambien and Folic Acid were refilled today. Return in 3 months with labs Work letter was given.  Thank you for choosing [place] at [facility] to provide your oncology and hematology care.  To afford each patient quality time with our provider, please arrive at least 15 minutes before your scheduled appointment time.   Beginning January 23rd 2017 lab work for the [facility] will be done in the  Main lab at [facility] on 1st floor. If you have a lab appointment with the [facility] please come in thru the  Main Entrance and check in at the main information desk  You need to re-schedule your appointment should you arrive 10 or more minutes late.  We strive to give you quality time with our providers, and arriving late affects you and other patients whose appointments are after yours.  Also, if you no show three or more times for appointments you may be dismissed from the clinic at the providers discretion.     Again, thank you for choosing [facility].  Our hope is that these requests will decrease the amount of time that you wait before being seen by our physicians.       _____________________________________________________________  Should you have questions after your visit to [facility], please contact our office at [phone] between the hours of 8:30 a.m. and 4:30 p.m.  Voicemails left after 4:30 p.m. will not be returned until the following business day.  For prescription refill requests, have your pharmacy contact our office.         Resources For Cancer Patients and their Caregivers ? American Cancer Society: Can assist with transportation, wigs, general needs, runs Look Good Feel Better.         909-243-4718 ? Cancer Care: Provides financial assistance, online support groups, medication/co-pay assistance.  1-800-813-HOPE (787) 041-1480) ? [facility] Assists [place] cancer patients and their families through emotional , educational and financial support.  (684)027-4489 ? [place] DSS Where to apply for food stamps, Medicaid and utility assistance. 240-451-6377 ? [facility]: Transportation to medical appointments. (540) 255-4086 ? Social Security Administration: May apply for disability if have a Stage IV cancer. (306)537-5327 202-493-5467 ? [employer], Disability and Transit Services: Assists with nutrition, care and transit needs. [place] Support Programs: @10RELATIVEDAYS @ > Cancer Support Group  2nd Tuesday of the month 1pm-2pm, Journey Room  > Creative Journey  3rd Tuesday of the month 1130am-1pm, Journey Room  > Look Good Feel Better  1st Wednesday of the month 10am-12 noon, Journey Room (Call [place] to register [phone])

## 2016-08-19 NOTE — Progress Notes (Signed)
[place], [place] [address]  Low grade mucinous neoplasm of appendix - Plan: CBC with Differential, Comprehensive metabolic panel  Insomnia - Plan: zolpidem (AMBIEN CR) 12.5 MG CR tablet  CURRENT THERAPY: Surveillance  INTERVAL HISTORY: Casey [name] 55 y.o. female returns for followup of Low grade, mucinous adenocarcinoma of appendix, S/P right hemicolectomy by Dr. [name] on 02/02/2016.  Now S/P CRS (greater and lesser omentectomy,bilateral salpingo-oophorectomy, peritoneal pelvic stripping and resection of diaphragmatic nodules)/HIPEC on 03/29/2016 by Dr. Johney Maine.  She continues with a number of complaints: 1. Diarrhea- she is intolerant to Imodium (causes increased bloating and abdominal pain).  Likely secondary to right hemicolectomy.  We had a discussion about the fact that this may not improve.  She is advised that she should plan accordingly. 2. Intermittent LLQ abdominal pain radiating inferiorly. 3. Dizziness.  I wonder if this is secondary to chronic mild dehydration from #1.  We discussed good hydration techniques/options. 4. Coccyx discomfort- negative CT imaging on 08/06/2016.  Not worse compared to 2 weeks ago when she had CT imaging.  She feels that she cannot return to work safely due to #1.  Review of Systems  Constitutional: Negative.  Negative for chills, fever and weight loss.  HENT: Negative.   Eyes: Negative.   Respiratory: Negative.  Negative for cough.   Cardiovascular: Negative.  Negative for chest pain.  Gastrointestinal: Positive for abdominal pain and diarrhea. Negative for blood in stool, constipation, melena, nausea and vomiting.  Skin: Negative.   Neurological: Negative.  Negative for weakness and headaches.  Endo/Heme/Allergies: Negative.   Psychiatric/Behavioral: Negative.     Past Medical History:  Diagnosis Date  . Anemia   . Anxiety   . Cancer ([place])    premelanoma per hip   . Cancer of  appendix ([place])   . Depression   . DUB (dysfunctional uterine bleeding)   . Dysrhythmia    pt states has PVCs   . GERD (gastroesophageal reflux disease)   . Graves disease   . History of blood transfusion   . History of urinary tract infection    last one approx 4 years ago   . Hyperlipidemia   . Hypertension   . 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 Migraines   . Ovarian cyst    both ovaries  . Palpitations   . Pneumonia    hx of had approx 22 years ago   . PONV (postoperative nausea and vomiting)   . Pulmonary nodule     Past Surgical History:  Procedure Laterality Date  . APPENDECTOMY    . DIAGNOSTIC LAPAROSCOPY    . DILATION AND CURETTAGE OF UTERUS    . infertility testing     . LAPAROSCOPIC PARTIAL COLECTOMY N/A 02/02/2016   Procedure: LAPAROSCOPIC PARTIAL COlectomy right colon;  Surgeon: Greer Pickerel, MD;  Location: [facility];  Service: General;  Laterality: N/A;  . OOPHORECTOMY     & fallopian tubes  . TONSILLECTOMY    . VAGINAL HYSTERECTOMY    . VESICOVAGINAL FISTULA CLOSURE W/ TAH     Dr. Margaretha Glassing  2 degree DUB     Family History  Problem Relation Age of Onset  . Hypertension Mother   . Irregular heart beat Mother   . Thyroid disease Maternal Grandmother   . Cancer Maternal Grandmother     lung  . Cancer Paternal Grandfather     lung  Social History   Social History  . Marital status: Married    Spouse name: N/A  . Number of children: N/A  . Years of education: N/A   Social History Main Topics  . Smoking status: Never Smoker  . Smokeless tobacco: Never Used  . Alcohol use No  . Drug use: No  . Sexual activity: Not Currently    Partners: Male    Birth control/ protection: Surgical     Comment: TVH   Other Topics Concern  . None   Social History Narrative  . None     PHYSICAL EXAMINATION  ECOG PERFORMANCE STATUS: 1 - Symptomatic but completely ambulatory  Vitals:   08/19/16 1400  BP: 120/69  Pulse: 63    Resp: 16  Temp: 97.9 F (36.6 C)    GENERAL:alert, well nourished, well developed, comfortable, cooperative, smiling and accompanied by husband SKIN: skin color, texture, turgor are normal, no rashes or significant lesions HEAD: Normocephalic, No masses, lesions, tenderness or abnormalities EYES: normal, EOMI, Conjunctiva are pink and non-injected EARS: External ears normal OROPHARYNX:lips, buccal mucosa, and tongue normal and mucous membranes are moist  NECK: supple, trachea midline LYMPH:  no palpable lymphadenopathy BREAST:not examined LUNGS: clear to auscultation  HEART: regular rate & rhythm ABDOMEN:abdomen soft, non-tender, obese and normal bowel sounds BACK: Back symmetric, no curvature., No CVA tenderness EXTREMITIES:less then 2 second capillary refill, no joint deformities, effusion, or inflammation, no skin discoloration, no cyanosis  NEURO: alert & oriented x 3 with fluent speech, no focal motor/sensory deficits, gait normal  LABORATORY DATA: CBC    Component Value Date/Time   WBC 7.4 05/27/2016 0902   WBC 9.3 02/04/2016 0440   RBC 3.92 05/27/2016 0902   RBC 4.47 02/04/2016 0440   HGB 12.9 02/04/2016 0440   HCT 34.5 05/27/2016 0902   PLT 265 05/27/2016 0902   MCV 88 05/27/2016 0902   MCH 29.3 05/27/2016 0902   MCH 28.9 02/04/2016 0440   MCHC 33.3 05/27/2016 0902   MCHC 32.8 02/04/2016 0440   RDW 15.2 05/27/2016 0902   LYMPHSABS 2.2 05/27/2016 0902   MONOABS 0.6 01/28/2016 1435   EOSABS 0.2 05/27/2016 0902   BASOSABS 0.1 05/27/2016 0902      Chemistry      Component Value Date/Time   NA 146 (H) 05/27/2016 0902   K 3.2 (L) 05/27/2016 0902   CL 104 05/27/2016 0902   CO2 25 05/27/2016 0902   BUN 9 05/27/2016 0902   CREATININE 0.93 05/27/2016 0902   CREATININE 1.01 03/28/2013 1621      Component Value Date/Time   CALCIUM 9.6 05/27/2016 0902   ALKPHOS 41 05/27/2016 0902   AST 13 05/27/2016 0902   ALT 10 05/27/2016 0902   BILITOT 0.4 05/27/2016 0902         PENDING LABS:   RADIOGRAPHIC STUDIES:  No results found.   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 [name] on 02/02/2016.  Now S/P CRS (greater and lesser omentectomy,bilateral salpingo-oophorectomy, peritoneal pelvic stripping and resection of diaphragmatic nodules)/HIPEC on 03/29/2016 by Dr. Johney Maine.  No role for labs today.  I personally reviewed and went over radiographic studies with the patient.  The results are noted within this dictation.  CT CAP with contrast performed at [facility] on 08/06/2016: 1.Status post right hemicolectomy ,CRS and HIPEC with expected postoperative changes. 2.No radiographic evidence of recurrent neoplasm or lymphadenopathy. 3.Other findings  as above.  Labs in 3 months: CBC diff, CMET.    She continues with chronic complaints: 1. Diarrhea- she is intolerant to Imodium (causes increased bloating and abdominal pain).  Likely secondary to right hemicolectomy. 2. Intermittent LLQ abdominal pain radiating inferiorly 3. Dizziness 4. Coccyx discomfort- negative CT imaging on 08/06/2016  She requests refills on medications. Ambien and Folic Acid.  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  She would like to be out of work for another 2-3 months.  She requests a letter for this.  Letter is completed.  We have also changed her work paperwork to reflect this.  Likely, this is the longest we can delay her return to work from a medical oncology standpoint.  She may return to work earlier if her diarrhea improves.  We had a frank conversation about her diarrhea.  She is advised that this might be her new normal and she must start planning accordingly.  Her diarrhea may not improve.  However, we must be hopeful  that it does improve in the near future.  Follow-up at [facility] with imaging in 6 months (as planned).  Return in 3 months for follow-up.     ORDERS PLACED FOR THIS ENCOUNTER: Orders Placed This Encounter  Procedures  . CBC with Differential  . Comprehensive metabolic panel    MEDICATIONS PRESCRIBED THIS ENCOUNTER: Meds ordered this encounter  Medications  . DISCONTD: folic acid (FOLVITE) 1 MG tablet    Sig: TAKE ONE (1) TABLET EACH DAY  . DISCONTD: zolpidem (AMBIEN CR) 12.5 MG CR tablet    Sig: Take 12.5 mg by mouth.  . folic acid (FOLVITE) 1 MG tablet    Sig: TAKE ONE (1) TABLET EACH DAY    Dispense:  30 tablet    Refill:  5    Order Specific Question:   Supervising Provider    Answer:   Patrici Ranks [id number]  . zolpidem (AMBIEN CR) 12.5 MG CR tablet    Sig: Take 1 tablet (12.5 mg total) by mouth at bedtime as needed for sleep.    Dispense:  30 tablet    Refill:  1    Order Specific Question:   Supervising Provider    Answer:   Patrici Ranks [id number]    THERAPY PLAN:  Ongoing surveillance.  All questions were answered. The patient knows to call the clinic with any problems, questions or concerns. We can certainly see the patient much sooner if necessary.  Patient and plan discussed with Dr. Ancil Linsey and she is in agreement with the aforementioned.   This note is electronically signed by: Doy Mince 08/19/2016 7:07 PM

## 2016-08-19 NOTE — Assessment & Plan Note (Addendum)
Low grade, mucinous adenocarcinoma of appendix, S/P right hemicolectomy by Dr. [name] on 02/02/2016.  Now S/P CRS (greater and lesser omentectomy,bilateral salpingo-oophorectomy, peritoneal pelvic stripping and resection of diaphragmatic nodules)/HIPEC on 03/29/2016 by Dr. Johney Maine.  No role for labs today.  I personally reviewed and went over radiographic studies with the patient.  The results are noted within this dictation.  CT CAP with contrast performed at [facility] on 08/06/2016: 1.Status post right hemicolectomy ,CRS and HIPEC with expected postoperative changes. 2.No radiographic evidence of recurrent neoplasm or lymphadenopathy. 3.Other findings as above.  Labs in 3 months: CBC diff, CMET.    She continues with chronic complaints: 1. Diarrhea- she is intolerant to Imodium (causes increased bloating and abdominal pain).  Likely secondary to right hemicolectomy. 2. Intermittent LLQ abdominal pain radiating inferiorly 3. Dizziness 4. Coccyx discomfort- negative CT imaging on 08/06/2016  She requests refills on medications. Ambien and Folic Acid.  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  She would like to be out of work for another 2-3 months.  She requests a letter for this.  Letter is completed.  We have also changed her work paperwork to reflect this.  Likely, this is the longest we can delay her return to work from a medical oncology standpoint.  She may return to work earlier if her diarrhea improves.  We had a frank conversation about her diarrhea.  She is advised that this might be her new normal and she must start planning accordingly.  Her diarrhea may not improve.  However, we must be hopeful that it does improve in the near future.  Follow-up at [facility] with imaging in 6 months (as planned).  Return in 3 months for follow-up.

## 2016-09-20 ENCOUNTER — Ambulatory Visit (INDEPENDENT_AMBULATORY_CARE_PROVIDER_SITE_OTHER): Payer: Commercial Managed Care - HMO | Admitting: Nurse Practitioner

## 2016-09-20 ENCOUNTER — Encounter: Payer: Self-pay | Admitting: Nurse Practitioner

## 2016-09-20 VITALS — BP 102/65 | HR 74 | Temp 98.4°F | Ht 69.75 in | Wt 203.0 lb

## 2016-09-20 DIAGNOSIS — Z23 Encounter for immunization: Secondary | ICD-10-CM

## 2016-09-20 DIAGNOSIS — Z1159 Encounter for screening for other viral diseases: Secondary | ICD-10-CM | POA: Diagnosis not present

## 2016-09-20 DIAGNOSIS — Z6829 Body mass index (BMI) 29.0-29.9, adult: Secondary | ICD-10-CM | POA: Diagnosis not present

## 2016-09-20 DIAGNOSIS — E039 Hypothyroidism, unspecified: Secondary | ICD-10-CM

## 2016-09-20 NOTE — Progress Notes (Signed)
   Subjective:    Patient ID: Casey [name], female    DOB: 1961-07-31, 55 y.o.   MRN: [id number]  HPI Patient comes in today for recheck of her thyroid. She is currently on levothyroxin 83mcg daily.  Last check TSH was 4.4- she has had other labs at oncology follow ups but they have not checked her thyroid. The last time she saw oncologist she got a cancer free check up. The only issue is is constantly having is diarrhea. Other wise she is starting to feel so much better.    Review of Systems  Constitutional: Negative.   HENT: Negative.   Respiratory: Negative.   Cardiovascular: Negative.   Genitourinary: Negative.   Neurological: Negative.   Psychiatric/Behavioral: Negative.   All other systems reviewed and are negative.      Objective:   Physical Exam  Constitutional: She is oriented to person, place, and time. She appears well-developed and well-nourished. No distress.  Cardiovascular: Normal rate, regular rhythm and normal heart sounds.   Pulmonary/Chest: Effort normal.  Abdominal: There is tenderness (but has ben this way since she had intrabdominal chemo.).  Neurological: She is alert and oriented to person, place, and time.  Skin: Skin is warm and dry.  Psychiatric: She has a normal mood and affect. Her behavior is normal. Judgment and thought content normal.   BP 102/65 (BP Location: Right Arm)   Pulse 74   Temp 98.4 F (36.9 C) (Oral)   Ht 5' 9.75" (1.772 m)   Wt 203 lb (92.1 kg)   LMP 05/23/1995   BMI 29.34 kg/m        Assessment & Plan:   1. BMI 29.0-29.9,adult   2. Acquired hypothyroidism    Continue current meds Labs pending  Follow up in 3 months  Welcome, FNP

## 2016-09-21 ENCOUNTER — Other Ambulatory Visit: Payer: Self-pay | Admitting: Nurse Practitioner

## 2016-09-21 LAB — HEPATITIS C ANTIBODY

## 2016-09-21 LAB — THYROID PANEL WITH TSH
Free Thyroxine Index: 2.2 (ref 1.2–4.9)
T3 Uptake Ratio: 27 % (ref 24–39)
T4 TOTAL: 8.1 ug/dL (ref 4.5–12.0)
TSH: 5.45 u[IU]/mL — ABNORMAL HIGH (ref 0.450–4.500)

## 2016-09-21 MED ORDER — LEVOTHYROXINE SODIUM 88 MCG PO TABS: 88.0000 ug | ORAL_TABLET | Freq: Every day | ORAL | 5 refills | Status: DC

## 2016-10-03 ENCOUNTER — Emergency Department (HOSPITAL_COMMUNITY): Payer: Commercial Managed Care - HMO

## 2016-10-03 ENCOUNTER — Encounter (HOSPITAL_COMMUNITY): Payer: Self-pay | Admitting: Nurse Practitioner

## 2016-10-03 ENCOUNTER — Emergency Department (HOSPITAL_COMMUNITY)
Admission: EM | Admit: 2016-10-03 | Discharge: 2016-10-03 | Disposition: A | Discharge status: Home / Self Care | Payer: Commercial Managed Care - HMO | Attending: Emergency Medicine | Admitting: Emergency Medicine

## 2016-10-03 DIAGNOSIS — R197 Diarrhea, unspecified: Secondary | ICD-10-CM | POA: Diagnosis not present

## 2016-10-03 DIAGNOSIS — R109 Unspecified abdominal pain: Secondary | ICD-10-CM

## 2016-10-03 DIAGNOSIS — K529 Noninfective gastroenteritis and colitis, unspecified: Secondary | ICD-10-CM

## 2016-10-03 DIAGNOSIS — R1030 Lower abdominal pain, unspecified: Secondary | ICD-10-CM | POA: Diagnosis present

## 2016-10-03 DIAGNOSIS — E876 Hypokalemia: Secondary | ICD-10-CM | POA: Diagnosis not present

## 2016-10-03 DIAGNOSIS — E039 Hypothyroidism, unspecified: Secondary | ICD-10-CM | POA: Insufficient documentation

## 2016-10-03 DIAGNOSIS — Z79899 Other long term (current) drug therapy: Secondary | ICD-10-CM | POA: Insufficient documentation

## 2016-10-03 DIAGNOSIS — E860 Dehydration: Secondary | ICD-10-CM | POA: Diagnosis not present

## 2016-10-03 DIAGNOSIS — Z85038 Personal history of other malignant neoplasm of large intestine: Secondary | ICD-10-CM | POA: Diagnosis not present

## 2016-10-03 DIAGNOSIS — I10 Essential (primary) hypertension: Secondary | ICD-10-CM | POA: Insufficient documentation

## 2016-10-03 LAB — URINE MICROSCOPIC-ADD ON

## 2016-10-03 LAB — COMPREHENSIVE METABOLIC PANEL
ALK PHOS: 37 U/L — AB (ref 38–126)
ALT: 15 U/L (ref 14–54)
ANION GAP: 11 (ref 5–15)
AST: 19 U/L (ref 15–41)
Albumin: 4.6 g/dL (ref 3.5–5.0)
BUN: 26 mg/dL — ABNORMAL HIGH (ref 6–20)
CALCIUM: 9.5 mg/dL (ref 8.9–10.3)
CHLORIDE: 103 mmol/L (ref 101–111)
CO2: 28 mmol/L (ref 22–32)
CREATININE: 1.39 mg/dL — AB (ref 0.44–1.00)
GFR, EST AFRICAN AMERICAN: 48 mL/min — AB (ref >60)
GFR, EST NON AFRICAN AMERICAN: 42 mL/min — AB (ref >60)
Glucose, Bld: 152 mg/dL — ABNORMAL HIGH (ref 65–99)
Potassium: 2.8 mmol/L — ABNORMAL LOW (ref 3.5–5.1)
Sodium: 142 mmol/L (ref 135–145)
Total Bilirubin: 0.2 mg/dL — ABNORMAL LOW (ref 0.3–1.2)
Total Protein: 7.6 g/dL (ref 6.5–8.1)

## 2016-10-03 LAB — URINALYSIS, ROUTINE W REFLEX MICROSCOPIC
Bilirubin Urine: NEGATIVE
Glucose, UA: NEGATIVE mg/dL
Ketones, ur: NEGATIVE mg/dL
NITRITE: NEGATIVE
PROTEIN: NEGATIVE mg/dL
Specific Gravity, Urine: 1.036 — ABNORMAL HIGH (ref 1.005–1.030)
pH: 5.5 (ref 5.0–8.0)

## 2016-10-03 LAB — CBC
HCT: 34.3 % — ABNORMAL LOW (ref 36.0–46.0)
Hemoglobin: 11.4 g/dL — ABNORMAL LOW (ref 12.0–15.0)
MCH: 29.2 pg (ref 26.0–34.0)
MCHC: 33.2 g/dL (ref 30.0–36.0)
MCV: 87.9 fL (ref 78.0–100.0)
PLATELETS: 254 10*3/uL (ref 150–400)
RBC: 3.9 MIL/uL (ref 3.87–5.11)
RDW: 14.5 % (ref 11.5–15.5)
WBC: 13.4 10*3/uL — AB (ref 4.0–10.5)

## 2016-10-03 LAB — LIPASE, BLOOD: LIPASE: 29 U/L (ref 11–51)

## 2016-10-03 LAB — MAGNESIUM: MAGNESIUM: 1.6 mg/dL — AB (ref 1.7–2.4)

## 2016-10-03 LAB — I-STAT BETA HCG BLOOD, ED (MC, WL, AP ONLY)

## 2016-10-03 IMAGING — CT CT ABD-PELV W/ CM
2 of 5 series · 15 of 46 positions shown, 17 images · IV contrast (ISOVUE)
Comparison: [DATE]

CLINICAL DATA: Left lower quadrant pain. History of cancer of
appendix, urinary tract infection, and ovarian cyst. Sudden onset
left abdominal pain beginning 5 hours ago.

EXAM:
CT ABDOMEN AND PELVIS WITH CONTRAST
TECHNIQUE: Multidetector CT imaging of the abdomen and pelvis was performed
using the standard protocol following bolus administration of
intravenous contrast.
CONTRAST:  15mL [TI] IOPAMIDOL ([TI]) INJECTION 61%,
100mL [TI] IOPAMIDOL ([TI]) INJECTION 61%

[Series 2: abd/pel with · axial · 0.84mm/px · z∈[-504,-58]mm · 12 of 103 slices shown, 14 images]
[im 7/103  soft-tissue]
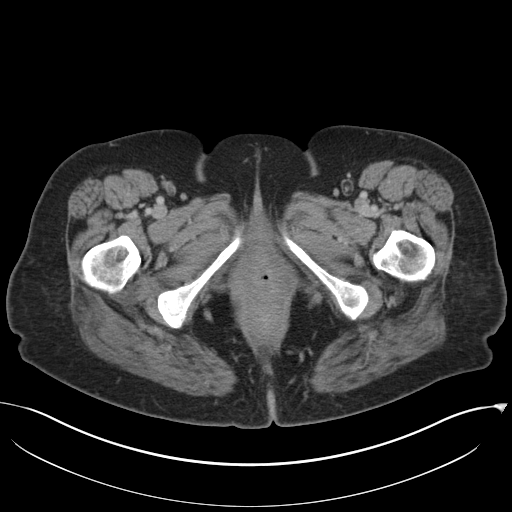
[im 7/103  bone]
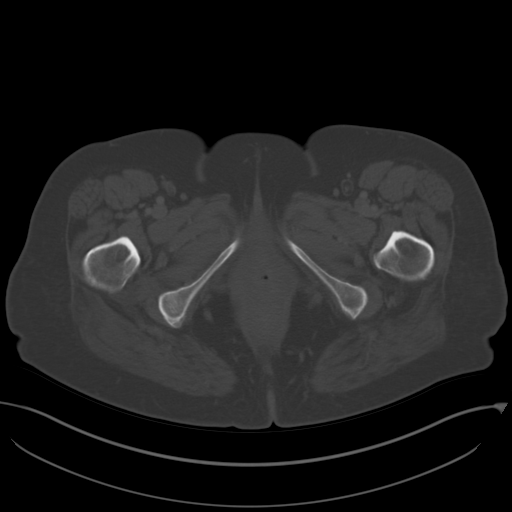
[im 14/103  soft-tissue]
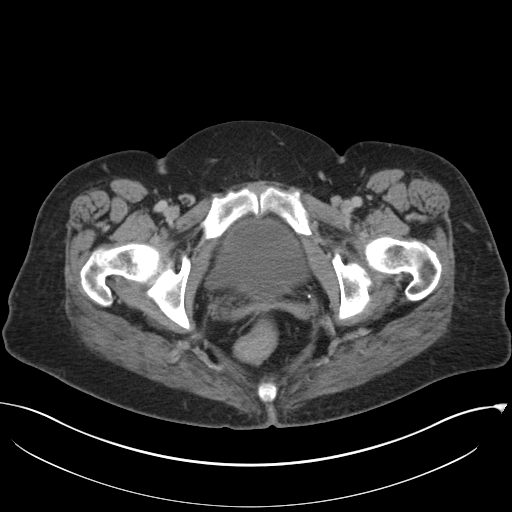
[im 21/103  soft-tissue]
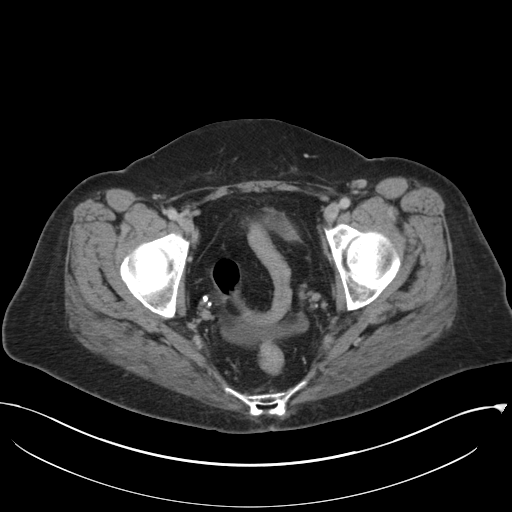
[im 35/103  soft-tissue]
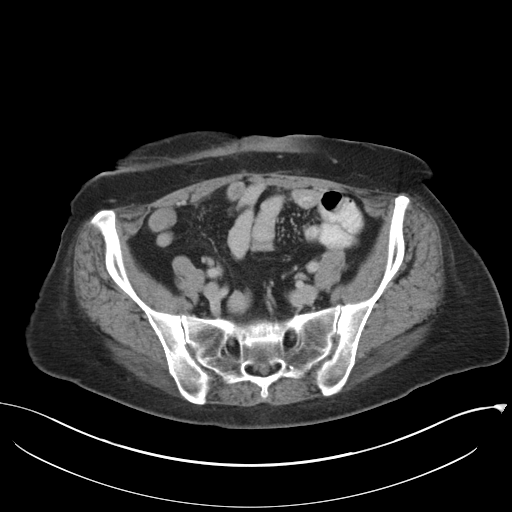
[im 41/103  soft-tissue]
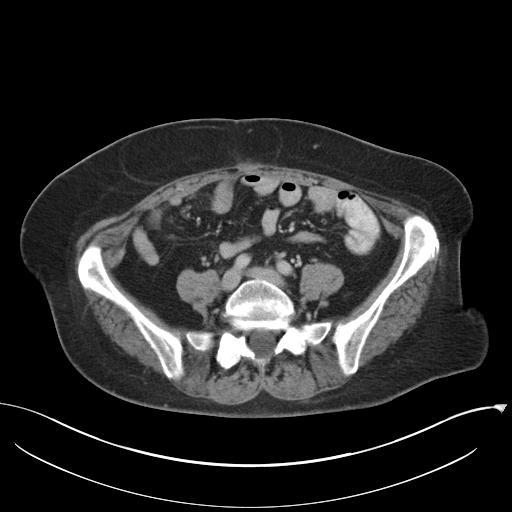
[im 48/103  soft-tissue]
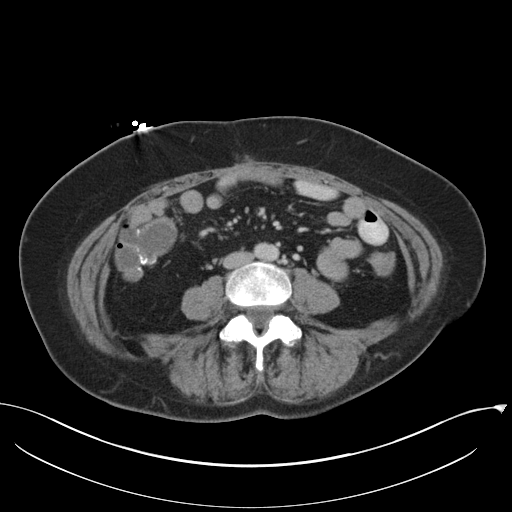
[im 55/103  soft-tissue]
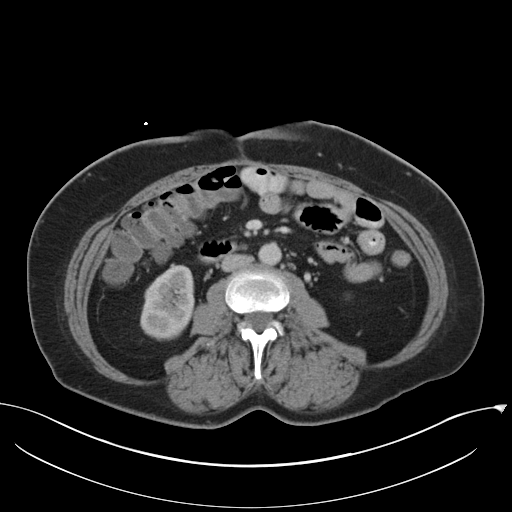
[im 62/103  soft-tissue]
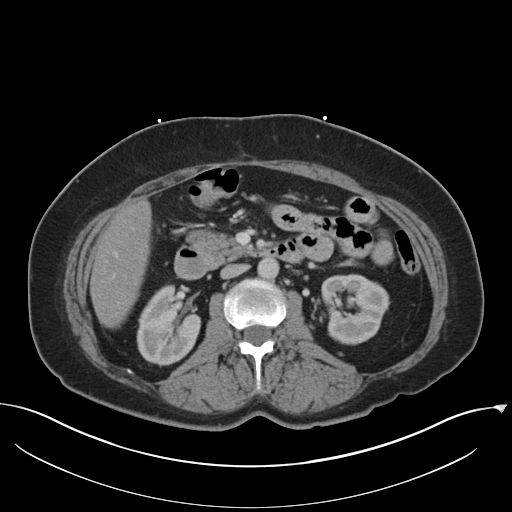
[im 69/103  soft-tissue]
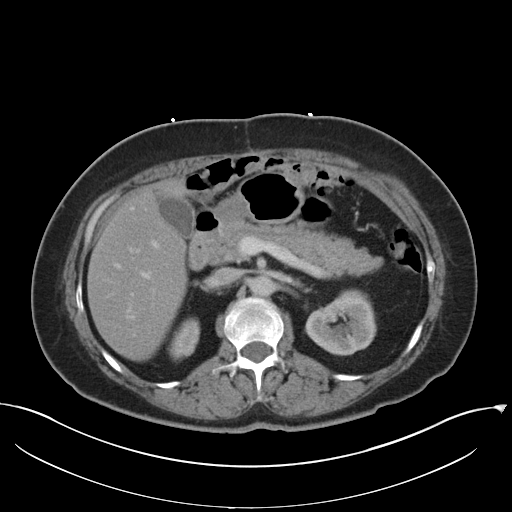
[im 69/103  bone]
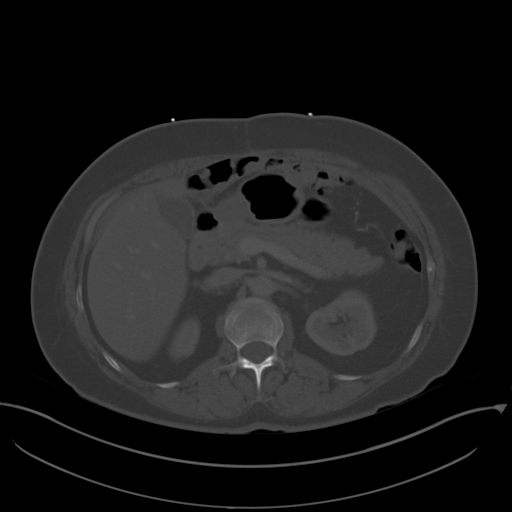
[im 82/103  soft-tissue]
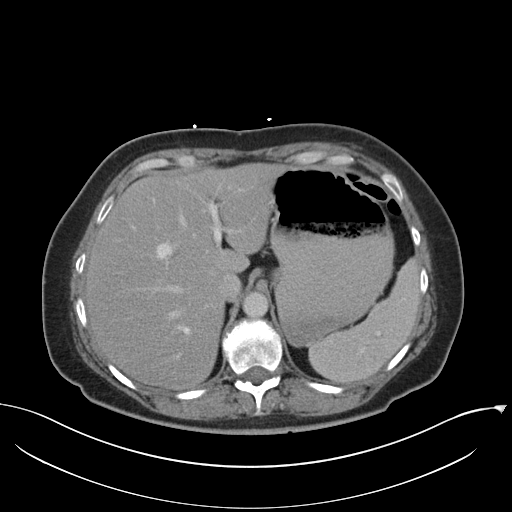
[im 89/103  soft-tissue]
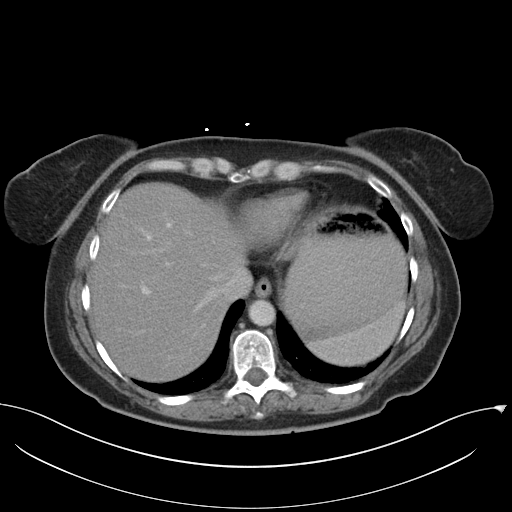
[im 96/103  soft-tissue]
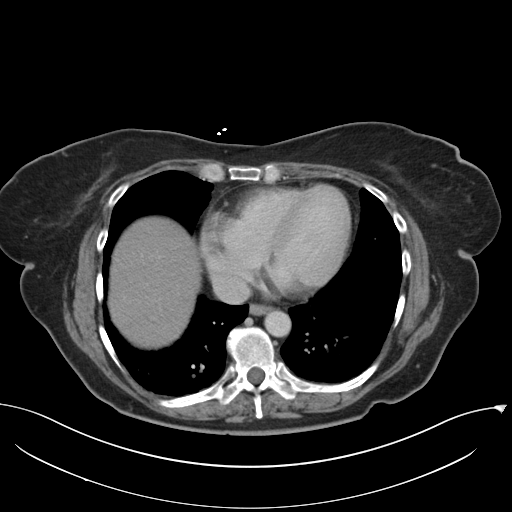

[Series 5: coronal a/|p · coronal · 0.76mm/px · 3 of 128 slices shown]
[im 43/128  soft-tissue]
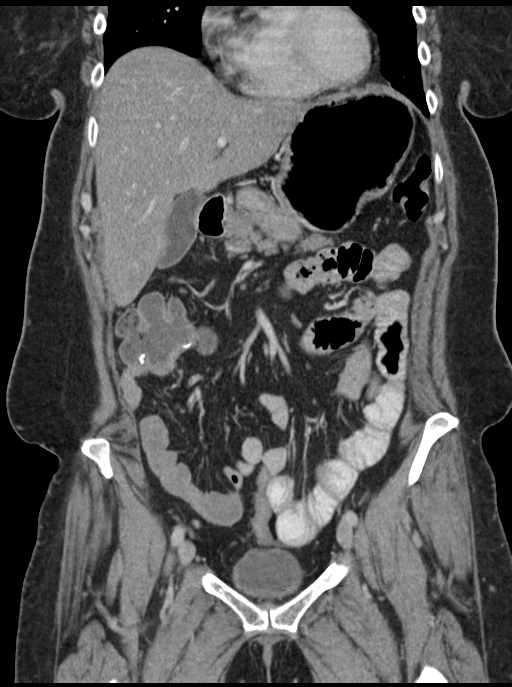
[im 57/128  soft-tissue]
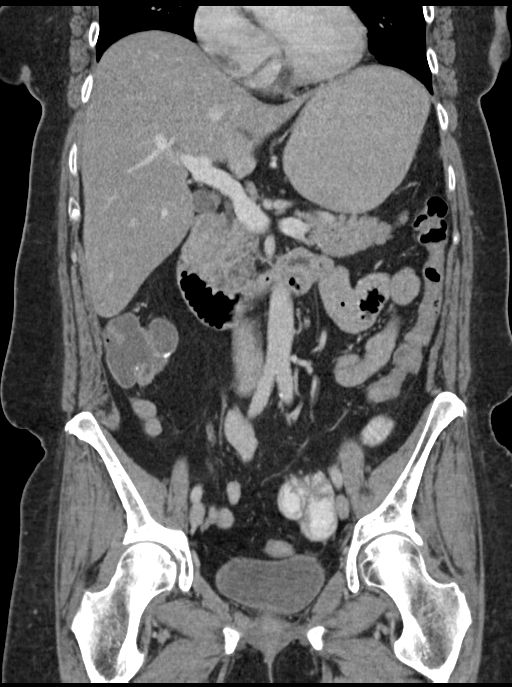
[im 71/128  soft-tissue]
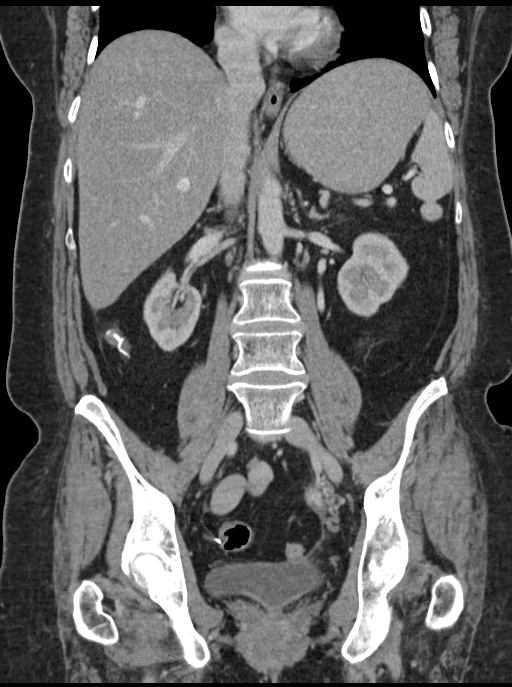

[15 of 46 positions shown; findings below may reference images not displayed]

FINDINGS: Lower chest: Atelectasis in the lung bases.

Hepatobiliary: Diffuse fatty infiltration of the liver. No focal
liver abnormality is seen. No gallstones, gallbladder wall
thickening, or biliary dilatation.

Pancreas: Unremarkable. No pancreatic ductal dilatation or
surrounding inflammatory changes.

Spleen: Normal in size without focal abnormality.

Adrenals/Urinary Tract: Adrenal glands are unremarkable. Kidneys are
normal, without renal calculi, focal lesion, or hydronephrosis.
Bladder is unremarkable.

Stomach/Bowel: Stomach, small bowel, and colon are not abnormally
distended. Postoperative changes in the cecum. Surgical resection of
previous appendiceal lesion. No colonic distention or wall
thickening. Mild infiltration or scarring in the right lower
quadrant fat is probably postoperative.

Vascular/Lymphatic: No significant vascular findings are present. No
enlarged abdominal or pelvic lymph nodes.

Reproductive: Status post hysterectomy. No adnexal masses.

Other: Small amount of free fluid in the pelvis may be reactive or
inflammatory. No free air in the abdomen. Abdominal wall musculature
appears intact.

Musculoskeletal: Circumscribed lucency in the T11 vertebral body is
unchanged since prior studies. Degenerative changes in the facet
joints.
IMPRESSION: Small amount of free fluid in the pelvis may be physiologic or
inflammatory. Postoperative changes in the cecum. No small or large
bowel distention or inflammatory changes. Diffuse fatty infiltration
of the liver.

## 2016-10-03 MED ORDER — SODIUM CHLORIDE 0.9 % IV BOLUS (SEPSIS)
1000.0000 mL | Freq: Once | INTRAVENOUS | Status: AC
  Administered 2016-10-03: 1000 mL via INTRAVENOUS

## 2016-10-03 MED ORDER — BISMUTH SUBSALICYLATE 262 MG/15ML PO SUSP: 30.0000 mL | Freq: Four times a day (QID) | ORAL | 0 refills | Status: AC | PRN

## 2016-10-03 MED ORDER — FENTANYL CITRATE (PF) 100 MCG/2ML IJ SOLN
50.0000 ug | INTRAMUSCULAR | Status: DC | PRN
  Administered 2016-10-03 (×2): 50 ug via INTRAVENOUS
  Filled 2016-10-03 (×2): qty 2

## 2016-10-03 MED ORDER — ACETAMINOPHEN 500 MG PO TABS
1000.0000 mg | ORAL_TABLET | Freq: Once | ORAL | Status: AC
  Administered 2016-10-03: 1000 mg via ORAL
  Filled 2016-10-03: qty 2

## 2016-10-03 MED ORDER — CALCIUM POLYCARBOPHIL 625 MG PO TABS: 625.0000 mg | ORAL_TABLET | Freq: Every day | ORAL | 0 refills | Status: DC

## 2016-10-03 MED ORDER — MAGNESIUM SULFATE 2 GM/50ML IV SOLN
2.0000 g | Freq: Once | INTRAVENOUS | Status: AC
  Administered 2016-10-03: 2 g via INTRAVENOUS
  Filled 2016-10-03: qty 50

## 2016-10-03 MED ORDER — KETOROLAC TROMETHAMINE 30 MG/ML IJ SOLN
30.0000 mg | Freq: Once | INTRAMUSCULAR | Status: AC
Start: 2016-10-03 — End: 2016-10-03
  Administered 2016-10-03: 30 mg via INTRAVENOUS
  Filled 2016-10-03: qty 1

## 2016-10-03 MED ORDER — IOPAMIDOL (ISOVUE-300) INJECTION 61%
15.0000 mL | Freq: Once | INTRAVENOUS | Status: AC | PRN
  Administered 2016-10-03: 15 mL via ORAL

## 2016-10-03 MED ORDER — IOPAMIDOL (ISOVUE-300) INJECTION 61%
100.0000 mL | Freq: Once | INTRAVENOUS | Status: AC | PRN
  Administered 2016-10-03: 100 mL via INTRAVENOUS

## 2016-10-03 MED ORDER — ONDANSETRON HCL 4 MG/2ML IJ SOLN
4.0000 mg | INTRAMUSCULAR | Status: AC | PRN
  Administered 2016-10-03 (×2): 4 mg via INTRAVENOUS
  Filled 2016-10-03 (×2): qty 2

## 2016-10-03 MED ORDER — POTASSIUM CHLORIDE CRYS ER 20 MEQ PO TBCR
40.0000 meq | EXTENDED_RELEASE_TABLET | Freq: Once | ORAL | Status: AC
Start: 2016-10-03 — End: 2016-10-03
  Administered 2016-10-03: 40 meq via ORAL
  Filled 2016-10-03: qty 2

## 2016-10-03 MED ORDER — POTASSIUM CHLORIDE ER 20 MEQ PO TBCR: 40.0000 meq | EXTENDED_RELEASE_TABLET | Freq: Two times a day (BID) | ORAL | 0 refills | Status: DC

## 2016-10-03 NOTE — ED Notes (Signed)
Requested urine from patient. Patient does not have to urinate at this time.

## 2016-10-03 NOTE — ED Notes (Signed)
Requested urine from patient. 

## 2016-10-03 NOTE — ED Triage Notes (Signed)
Pt is reporting severe lower quads pain, onset 2 hours ago and it has not eased since then. Add an extensive GI hx including multiple surgeries.

## 2016-10-03 NOTE — ED Provider Notes (Signed)
[facility] DEPT Provider Note   CSN: [id number] Arrival date & time: 10/03/16  0054 By signing my name below, I, Doran Stabler, attest that this documentation has been prepared under the direction and in the presence of Leo Grosser, MD. Electronically Signed: Doran Stabler, ED Scribe. 10/03/16. 1:40 AM.  History   Chief Complaint Chief Complaint  Patient presents with  . Abdominal Pain    The history is provided by the patient. No language interpreter was used.   HPI Comments: Casey [name] is a 55 y.o. female who presents to the Emergency Department with a PMHx of cancer of appendix, UTI, and ovarian cyst complaining of a sudden onset of constant left lower abdominal pain began 5 hours ago. Pt states her pain radiates across her abdomen and to her back. Pt has had chronic diarrhea since her appendectomy. Pt denies any hematuria, blood in stools, or any other symptoms at this time. Pt last had imaging performed September 2017.   Past Medical History:  Diagnosis Date  . Anemia   . Anxiety   . Cancer ([place])    premelanoma per hip   . Cancer of appendix ([place])   . Depression   . DUB (dysfunctional uterine bleeding)   . Dysrhythmia    pt states has PVCs   . GERD (gastroesophageal reflux disease)   . Graves disease   . History of blood transfusion   . History of urinary tract infection    last one approx 4 years ago   . Hyperlipidemia   . Hypertension   . 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 Migraines   . Ovarian cyst    both ovaries  . Palpitations   . Pneumonia    hx of had approx 22 years ago   . PONV (postoperative nausea and vomiting)   . Pulmonary nodule    Patient Active Problem List   Diagnosis Date Noted  . 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 Acute appendicitis with perforation and peritoneal abscess 08/08/2015  . GERD (gastroesophageal reflux disease) 03/18/2014  . Depression 03/18/2014  . Hypertension  03/28/2013  . Hyperlipidemia 03/28/2013  . Hypothyroidism 03/28/2013  . Graves' disease   . Palpitations    Past Surgical History:  Procedure Laterality Date  . APPENDECTOMY    . DIAGNOSTIC LAPAROSCOPY    . DILATION AND CURETTAGE OF UTERUS    . infertility testing     . LAPAROSCOPIC PARTIAL COLECTOMY N/A 02/02/2016   Procedure: LAPAROSCOPIC PARTIAL COlectomy right colon;  Surgeon: Greer Pickerel, MD;  Location: [facility];  Service: General;  Laterality: N/A;  . OOPHORECTOMY     & fallopian tubes  . TONSILLECTOMY    . VAGINAL HYSTERECTOMY    . VESICOVAGINAL FISTULA CLOSURE W/ TAH     Dr. Margaretha Glassing  2 degree DUB    OB History    Gravida Para Term Preterm AB Living   3 3 3     3    SAB TAB Ectopic Multiple Live Births           3     Home Medications    Prior to Admission medications   Medication Sig Start Date End Date Taking? Authorizing Provider  acetaminophen (TYLENOL) 500 MG tablet Take 500 mg by mouth 3 times/day as needed-between meals & bedtime. 08/09/15   Historical Provider, MD  cetirizine (ZYRTEC) 10 MG tablet TAKE ONE (1) TABLET EACH DAY 07/09/16   [name] Hassell Done, FNP  escitalopram (LEXAPRO) 10 MG tablet TAKE ONE (1) TABLET EACH DAY 07/09/16   [name] Hassell Done, FNP  fenofibrate 160 MG tablet TAKE ONE (1) TABLET EACH DAY 04/12/16   [name] Hassell Done, FNP  folic acid (FOLVITE) 1 MG tablet TAKE ONE (1) TABLET EACH DAY 08/19/16   Baird Cancer, PA-C  Ibuprofen 200 MG CAPS Take 200 mg by mouth 3 times/day as needed-between meals & bedtime.    Historical Provider, MD  levothyroxine (SYNTHROID) 88 MCG tablet Take 1 tablet (88 mcg total) by mouth daily before breakfast. 09/21/16   [name] Hassell Done, FNP  metoprolol succinate (TOPROL-XL) 50 MG 24 hr tablet TAKE ONE (1) TABLET EACH DAY 04/12/16   [name] Hassell Done, FNP  ondansetron (ZOFRAN) 8 MG tablet Take 1 tablet (8 mg total) by mouth every 8 (eight) hours as needed for nausea or vomiting. Reported on  04/09/2016 Patient not taking: Reported on 09/20/2016 06/23/16   Patrici Ranks, MD  pantoprazole (PROTONIX) 40 MG tablet Take 1 tablet (40 mg total) by mouth as needed. Patient taking differently: Take 40 mg by mouth daily as needed (for GERD).  06/18/15   [name] Hassell Done, FNP  valsartan-hydrochlorothiazide (DIOVAN-HCT) 160-12.5 MG tablet TAKE ONE (1) TABLET EACH DAY 06/08/16   [name] Hassell Done, FNP  zolpidem (AMBIEN CR) 12.5 MG CR tablet Take 1 tablet (12.5 mg total) by mouth at bedtime as needed for sleep. 08/19/16   Baird Cancer, PA-C   Family History Family History  Problem Relation Age of Onset  . Hypertension Mother   . Irregular heart beat Mother   . Thyroid disease Maternal Grandmother   . Cancer Maternal Grandmother     lung  . Cancer Paternal Grandfather     lung   Social History Social History  Substance Use Topics  . Smoking status: Never Smoker  . Smokeless tobacco: Never Used  . Alcohol use No   Allergies   Adhesive  [tape]; Buprenorphine hcl; Labetalol; Morphine and related; Other; and Zosyn [piperacillin sod-tazobactam so]  Review of Systems Review of Systems  Constitutional: Negative for chills and fever.  Respiratory: Negative for shortness of breath.   Cardiovascular: Negative for chest pain.  Gastrointestinal: Positive for abdominal pain and diarrhea. Negative for blood in stool.  Genitourinary: Negative for difficulty urinating and dysuria.  All other systems reviewed and are negative.  Physical Exam Updated Vital Signs BP 145/80 (BP Location: Right Arm)   Pulse 70   Temp 98.1 F (36.7 C) (Oral)   Resp 18   LMP 05/23/1995   SpO2 100%   Physical Exam  Constitutional: She is oriented to person, place, and time. She appears well-developed and well-nourished. No distress.  HENT:  Head: Normocephalic.  Nose: Nose normal.  Eyes: Conjunctivae are normal.  Neck: Neck supple. No tracheal deviation present.  Cardiovascular: Normal rate and  regular rhythm.   Pulmonary/Chest: Effort normal. No respiratory distress.  Abdominal: Soft. She exhibits no distension.  Focal LLQ tenderness with involuntary guarding. Well healed midline abdominal incision site.   Neurological: She is alert and oriented to person, place, and time.  Skin: Skin is warm and dry.  Psychiatric: She has a normal mood and affect.   ED Treatments / Results  DIAGNOSTIC STUDIES: Oxygen Saturation is 100% on RA, normal by my interpretation.    COORDINATION OF CARE: 1:40 AM Discussed treatment plan with pt at bedside and pt agreed to plan.  Labs (all labs ordered are listed, but only abnormal results are displayed) Labs Reviewed  COMPREHENSIVE  METABOLIC PANEL - Abnormal; Notable for the following:       Result Value   Potassium 2.8 (*)    Glucose, Bld 152 (*)    BUN 26 (*)    Creatinine, Ser 1.39 (*)    Alkaline Phosphatase 37 (*)    Total Bilirubin 0.2 (*)    GFR calc non Af Amer 42 (*)    GFR calc Af Amer 48 (*)    All other components within normal limits  CBC - Abnormal; Notable for the following:    WBC 13.4 (*)    Hemoglobin 11.4 (*)    HCT 34.3 (*)    All other components within normal limits  URINALYSIS, ROUTINE W REFLEX MICROSCOPIC (NOT AT [facility]) - Abnormal; Notable for the following:    APPearance CLOUDY (*)    Specific Gravity, Urine 1.036 (*)    Hgb urine dipstick TRACE (*)    Leukocytes, UA SMALL (*)    All other components within normal limits  MAGNESIUM - Abnormal; Notable for the following:    Magnesium 1.6 (*)    All other components within normal limits  URINE MICROSCOPIC-ADD ON - Abnormal; Notable for the following:    Squamous Epithelial / LPF 0-5 (*)    Bacteria, UA FEW (*)    All other components within normal limits  LIPASE, BLOOD  I-STAT BETA HCG BLOOD, ED ([facility], [facility], [facility] ONLY)   EKG  EKG Interpretation None      Radiology Ct Abdomen Pelvis W Contrast  Result Date: 10/03/2016 CLINICAL DATA:  Left lower quadrant  pain. History of cancer of appendix, urinary tract infection, and ovarian cyst. Sudden onset left abdominal pain beginning 5 hours ago. EXAM: CT ABDOMEN AND PELVIS WITH CONTRAST TECHNIQUE: Multidetector CT imaging of the abdomen and pelvis was performed using the standard protocol following bolus administration of intravenous contrast. CONTRAST:  56mL ISOVUE-300 IOPAMIDOL (ISOVUE-300) INJECTION 61%, 125mL ISOVUE-300 IOPAMIDOL (ISOVUE-300) INJECTION 61% COMPARISON:  01/07/2016 FINDINGS: Lower chest: Atelectasis in the lung bases. Hepatobiliary: Diffuse fatty infiltration of the liver. No focal liver abnormality is seen. No gallstones, gallbladder wall thickening, or biliary dilatation. Pancreas: Unremarkable. No pancreatic ductal dilatation or surrounding inflammatory changes. Spleen: Normal in size without focal abnormality. Adrenals/Urinary Tract: Adrenal glands are unremarkable. Kidneys are normal, without renal calculi, focal lesion, or hydronephrosis. Bladder is unremarkable. Stomach/Bowel: Stomach, small bowel, and colon are not abnormally distended. Postoperative changes in the cecum. Surgical resection of previous appendiceal lesion. No colonic distention or wall thickening. Mild infiltration or scarring in the right lower quadrant fat is probably postoperative. Vascular/Lymphatic: No significant vascular findings are present. No enlarged abdominal or pelvic lymph nodes. Reproductive: Status post hysterectomy. No adnexal masses. Other: Small amount of free fluid in the pelvis may be reactive or inflammatory. No free air in the abdomen. Abdominal wall musculature appears intact. Musculoskeletal: Circumscribed lucency in the T11 vertebral body is unchanged since prior studies. Degenerative changes in the facet joints. IMPRESSION: Small amount of free fluid in the pelvis may be physiologic or inflammatory. Postoperative changes in the cecum. No small or large bowel distention or inflammatory changes. Diffuse  fatty infiltration of the liver. Electronically Signed   By: Lucienne Capers M.D.   On: 10/03/2016 03:16    Procedures Procedures (including critical care time)  Medications Ordered in ED Medications  fentaNYL (SUBLIMAZE) injection 50 mcg (50 mcg Intravenous Given 10/03/16 0249)  ondansetron (ZOFRAN) injection 4 mg (4 mg Intravenous Given 10/03/16 0249)  iopamidol (ISOVUE-300) 61 %  injection 15 mL (15 mLs Oral Contrast Given 10/03/16 0158)  iopamidol (ISOVUE-300) 61 % injection 100 mL (100 mLs Intravenous Contrast Given 10/03/16 0257)  sodium chloride 0.9 % bolus 1,000 mL (1,000 mLs Intravenous New Bag/Given 10/03/16 0406)  acetaminophen (TYLENOL) tablet 1,000 mg (1,000 mg Oral Given 10/03/16 0402)  ketorolac (TORADOL) 30 MG/ML injection 30 mg (30 mg Intravenous Given 10/03/16 0403)  potassium chloride SA (K-DUR,KLOR-CON) CR tablet 40 mEq (40 mEq Oral Given 10/03/16 0402)  magnesium sulfate IVPB 2 g 50 mL (2 g Intravenous New Bag/Given 10/03/16 0445)    Initial Impression / Assessment and Plan / ED Course  I have reviewed the triage vital signs and the nursing notes.  Pertinent labs & imaging results that were available during my care of the patient were reviewed by me and considered in my medical decision making (see chart for details).  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 She is significantly tender on arrival, was provided a dose of pain medication, anti-inflammatory medication and IV fluids with resolution of symptoms. Mild leukocytosis is evident and with surgical history CT scan was performed to rule out postsurgical or other acute pathology.  CT results are reassuring for lack of acute intra-abdominal process. Patient has notable electrolyte abnormalities which were assessed on this visit  and will be repleted at home. She has not been taking anything for her diarrhea which she has had for several months. I recommended bismuth salts for short-term control of bowel movements and ongoing fiber supplementation to help regain regularity with probiotic supplementation her diet.  She was given potassium supplementation for home along with instructions on how to better control her diarrhea which appears to have caused a mild dehydration. Plan to follow up with PCP as needed and return precautions discussed for worsening or new concerning symptoms.   Final Clinical Impressions(s) / ED Diagnoses   Final diagnoses:  Dehydration  Chronic diarrhea  Abdominal cramping  Hypokalemia  Hypomagnesemia   New Prescriptions New Prescriptions   BISMUTH SUBSALICYLATE (PEPTO-BISMOL) 262 MG/15ML SUSPENSION    Take 30 mLs by mouth every 6 (six) hours as needed for diarrhea or loose stools.   POLYCARBOPHIL (FIBERCON) 625 MG TABLET    Take 1 tablet (625 mg total) by mouth daily.   POTASSIUM CHLORIDE 20 MEQ TBCR    Take 40 mEq by mouth 2 (two) times daily.   I personally performed the services described in this documentation, which was scribed in my presence. The recorded information has been reviewed and is accurate.    Leo Grosser, MD 10/03/16 [phone]

## 2016-10-19 ENCOUNTER — Other Ambulatory Visit: Payer: Self-pay | Admitting: Nurse Practitioner

## 2016-10-25 ENCOUNTER — Ambulatory Visit (INDEPENDENT_AMBULATORY_CARE_PROVIDER_SITE_OTHER): Payer: Commercial Managed Care - HMO | Admitting: Nurse Practitioner

## 2016-10-25 ENCOUNTER — Encounter: Payer: Self-pay | Admitting: Nurse Practitioner

## 2016-10-25 VITALS — BP 117/77 | HR 68 | Temp 97.8°F | Ht 69.0 in | Wt 202.0 lb

## 2016-10-25 DIAGNOSIS — E039 Hypothyroidism, unspecified: Secondary | ICD-10-CM | POA: Diagnosis not present

## 2016-10-25 DIAGNOSIS — J0101 Acute recurrent maxillary sinusitis: Secondary | ICD-10-CM | POA: Diagnosis not present

## 2016-10-25 MED ORDER — CEFDINIR 300 MG PO CAPS: 300.0000 mg | ORAL_CAPSULE | Freq: Two times a day (BID) | ORAL | 0 refills | Status: DC

## 2016-10-25 NOTE — Patient Instructions (Signed)

## 2016-10-25 NOTE — Progress Notes (Addendum)
Subjective:     Casey [name] is a 55 y.o. female who presents for evaluation of sinus pain. Symptoms include: congestion, epistaxis, facial pain, headaches, nasal congestion, sinus pressure, tooth pain and right ear pain. Onset of symptoms was 1 week ago. Symptoms have been gradually worsening since that time. Past history is significant for occasional episodes of bronchitis. Patient is a non-smoker. Has been taking mucinex with no relief The following portions of the patient's history were reviewed and updated as appropriate: allergies, current medications, past family history, past medical history, past social history, past surgical history and problem list.  Review of Systems Pertinent items noted in HPI and remainder of comprehensive ROS otherwise negative.   Objective:    BP 117/77   Pulse 68   Temp 97.8 F (36.6 C) (Oral)   Ht 5\' 9"  (1.753 m)   Wt 202 lb (91.6 kg)   LMP 05/23/1995   BMI 29.83 kg/m  General appearance: alert and cooperative Eyes: conjunctivae/corneas clear. PERRL, EOM's intact. Fundi benign. Ears: normal TM and external ear canal left ear and abnormal TM right ear - erythematous and bulging Nose: clear discharge, moderate congestion, turbinates red, sinus tenderness right Throat: lips, mucosa, and tongue normal; teeth and gums normal Neck: no adenopathy, no carotid bruit, no JVD, supple, symmetrical, trachea midline and thyroid not enlarged, symmetric, no tenderness/mass/nodules Lungs: clear to auscultation bilaterally Heart: regular rate and rhythm, S1, S2 normal, no murmur, click, rub or gallop    Assessment:    Acute bacterial sinusitis.    Plan:     1. Take meds as prescribed 2. Use a cool mist humidifier especially during the winter months and when heat has been humid. 3. Use saline nose sprays frequently 4. Saline irrigations of the nose can be very helpful if done frequently.  * 4X daily for 1 week*  * Use of a nettie pot can be helpful with this.  Follow directions with this* 5. Drink plenty of fluids 6. Keep thermostat turn down low 7.For any cough or congestion  Use plain Mucinex- regular strength or max strength is fine   * Children- consult with Pharmacist for dosing 8. For fever or aces or pains- take tylenol or ibuprofen appropriate for age and weight.  * for fevers greater than 101 orally you may alternate ibuprofen and tylenol every  3 hours.   Meds ordered this encounter  Medications  . cefdinir (OMNICEF) 300 MG capsule    Sig: Take 1 capsule (300 mg total) by mouth 2 (two) times daily. 1 po BID    Dispense:  20 capsule    Refill:  0    Order Specific Question:   Supervising Provider    Answer:   Eustaquio Maize [[id number]]   [name] Hassell Done, FNP   * went to ER 2 weeks ago and creatine was up some and potassium and mag were out of whack- needs labs repeated. * Hypothyroidism- had meds changed 5 weeks ago- needs labs repeated

## 2016-10-25 NOTE — Addendum Note (Signed)
Addended by: Chevis Pretty on: 10/25/2016 12:40 PM   Modules accepted: Orders

## 2016-10-26 ENCOUNTER — Other Ambulatory Visit: Payer: Self-pay | Admitting: Nurse Practitioner

## 2016-10-26 LAB — CMP14+EGFR
ALBUMIN: 4.4 g/dL (ref 3.5–5.5)
ALT: 11 IU/L (ref 0–32)
AST: 14 IU/L (ref 0–40)
Albumin/Globulin Ratio: 1.9 (ref 1.2–2.2)
Alkaline Phosphatase: 42 IU/L (ref 39–117)
BUN / CREAT RATIO: 13 (ref 9–23)
BUN: 18 mg/dL (ref 6–24)
Bilirubin Total: 0.2 mg/dL (ref 0.0–1.2)
CO2: 30 mmol/L — AB (ref 18–29)
CREATININE: 1.4 mg/dL — AB (ref 0.57–1.00)
Calcium: 9.7 mg/dL (ref 8.7–10.2)
Chloride: 100 mmol/L (ref 96–106)
GFR calc non Af Amer: 42 mL/min/{1.73_m2} — ABNORMAL LOW (ref >59)
GFR, EST AFRICAN AMERICAN: 49 mL/min/{1.73_m2} — AB (ref >59)
Globulin, Total: 2.3 g/dL (ref 1.5–4.5)
Glucose: 86 mg/dL (ref 65–99)
Potassium: 3 mmol/L — ABNORMAL LOW (ref 3.5–5.2)
Sodium: 145 mmol/L — ABNORMAL HIGH (ref 134–144)
TOTAL PROTEIN: 6.7 g/dL (ref 6.0–8.5)

## 2016-10-26 LAB — THYROID PANEL WITH TSH
FREE THYROXINE INDEX: 2.8 (ref 1.2–4.9)
T3 UPTAKE RATIO: 27 % (ref 24–39)
T4, Total: 10.5 ug/dL (ref 4.5–12.0)
TSH: 4.29 u[IU]/mL (ref 0.450–4.500)

## 2016-10-26 MED ORDER — POTASSIUM CHLORIDE ER 10 MEQ PO TBCR: 10.0000 meq | EXTENDED_RELEASE_TABLET | Freq: Every day | ORAL | 3 refills | Status: DC

## 2016-11-18 ENCOUNTER — Encounter (HOSPITAL_COMMUNITY): Payer: Self-pay | Admitting: Oncology

## 2016-11-18 ENCOUNTER — Encounter (HOSPITAL_COMMUNITY): Payer: Commercial Managed Care - HMO

## 2016-11-18 ENCOUNTER — Encounter (HOSPITAL_COMMUNITY): Payer: Commercial Managed Care - HMO | Attending: Oncology | Admitting: Oncology

## 2016-11-18 VITALS — BP 111/65 | HR 67 | Temp 98.3°F | Resp 16 | Wt 200.0 lb

## 2016-11-18 DIAGNOSIS — R197 Diarrhea, unspecified: Secondary | ICD-10-CM | POA: Diagnosis not present

## 2016-11-18 DIAGNOSIS — C181 Malignant neoplasm of appendix: Secondary | ICD-10-CM | POA: Diagnosis not present

## 2016-11-18 DIAGNOSIS — N83209 Unspecified ovarian cyst, unspecified side: Secondary | ICD-10-CM | POA: Insufficient documentation

## 2016-11-18 DIAGNOSIS — K219 Gastro-esophageal reflux disease without esophagitis: Secondary | ICD-10-CM | POA: Insufficient documentation

## 2016-11-18 DIAGNOSIS — Z9889 Other specified postprocedural states: Secondary | ICD-10-CM | POA: Diagnosis not present

## 2016-11-18 DIAGNOSIS — Z801 Family history of malignant neoplasm of trachea, bronchus and lung: Secondary | ICD-10-CM | POA: Diagnosis not present

## 2016-11-18 DIAGNOSIS — I10 Essential (primary) hypertension: Secondary | ICD-10-CM | POA: Diagnosis not present

## 2016-11-18 DIAGNOSIS — Z8249 Family history of ischemic heart disease and other diseases of the circulatory system: Secondary | ICD-10-CM | POA: Diagnosis not present

## 2016-11-18 DIAGNOSIS — R5383 Other fatigue: Secondary | ICD-10-CM | POA: Diagnosis not present

## 2016-11-18 DIAGNOSIS — E876 Hypokalemia: Secondary | ICD-10-CM | POA: Diagnosis not present

## 2016-11-18 DIAGNOSIS — E0500 Thyrotoxicosis with diffuse goiter without thyrotoxic crisis or storm: Secondary | ICD-10-CM | POA: Insufficient documentation

## 2016-11-18 DIAGNOSIS — E785 Hyperlipidemia, unspecified: Secondary | ICD-10-CM | POA: Diagnosis not present

## 2016-11-18 DIAGNOSIS — N938 Other specified abnormal uterine and vaginal bleeding: Secondary | ICD-10-CM | POA: Diagnosis not present

## 2016-11-18 DIAGNOSIS — F419 Anxiety disorder, unspecified: Secondary | ICD-10-CM | POA: Diagnosis not present

## 2016-11-18 DIAGNOSIS — G4700 Insomnia, unspecified: Secondary | ICD-10-CM

## 2016-11-18 DIAGNOSIS — F329 Major depressive disorder, single episode, unspecified: Secondary | ICD-10-CM | POA: Insufficient documentation

## 2016-11-18 DIAGNOSIS — D373 Neoplasm of uncertain behavior of appendix: Secondary | ICD-10-CM

## 2016-11-18 LAB — CBC WITH DIFFERENTIAL/PLATELET
Basophils Absolute: 0.1 10*3/uL (ref 0.0–0.1)
Basophils Relative: 1 %
EOS PCT: 2 %
Eosinophils Absolute: 0.2 10*3/uL (ref 0.0–0.7)
HEMATOCRIT: 36 % (ref 36.0–46.0)
HEMOGLOBIN: 12.1 g/dL (ref 12.0–15.0)
LYMPHS ABS: 2.6 10*3/uL (ref 0.7–4.0)
LYMPHS PCT: 34 %
MCH: 30.6 pg (ref 26.0–34.0)
MCHC: 33.6 g/dL (ref 30.0–36.0)
MCV: 91.1 fL (ref 78.0–100.0)
Monocytes Absolute: 0.5 10*3/uL (ref 0.1–1.0)
Monocytes Relative: 7 %
NEUTROS ABS: 4.4 10*3/uL (ref 1.7–7.7)
NEUTROS PCT: 56 %
Platelets: 260 10*3/uL (ref 150–400)
RBC: 3.95 MIL/uL (ref 3.87–5.11)
RDW: 13.3 % (ref 11.5–15.5)
WBC: 7.7 10*3/uL (ref 4.0–10.5)

## 2016-11-18 LAB — COMPREHENSIVE METABOLIC PANEL
ALK PHOS: 49 U/L (ref 38–126)
ALT: 19 U/L (ref 14–54)
AST: 19 U/L (ref 15–41)
Albumin: 4.2 g/dL (ref 3.5–5.0)
Anion gap: 7 (ref 5–15)
BILIRUBIN TOTAL: 0.4 mg/dL (ref 0.3–1.2)
BUN: 23 mg/dL — AB (ref 6–20)
CALCIUM: 9.2 mg/dL (ref 8.9–10.3)
CO2: 32 mmol/L (ref 22–32)
CREATININE: 1.44 mg/dL — AB (ref 0.44–1.00)
Chloride: 102 mmol/L (ref 101–111)
GFR, EST AFRICAN AMERICAN: 46 mL/min — AB (ref >60)
GFR, EST NON AFRICAN AMERICAN: 40 mL/min — AB (ref >60)
Glucose, Bld: 134 mg/dL — ABNORMAL HIGH (ref 65–99)
Potassium: 2.6 mmol/L — CL (ref 3.5–5.1)
Sodium: 141 mmol/L (ref 135–145)
Total Protein: 7.4 g/dL (ref 6.5–8.1)

## 2016-11-18 MED ORDER — POTASSIUM CHLORIDE CRYS ER 20 MEQ PO TBCR: 60.0000 meq | EXTENDED_RELEASE_TABLET | Freq: Every day | ORAL | 0 refills | Status: DC

## 2016-11-18 MED ORDER — FOLIC ACID 1 MG PO TABS: ORAL_TABLET | ORAL | 5 refills | Status: DC

## 2016-11-18 MED ORDER — ZOLPIDEM TARTRATE ER 12.5 MG PO TBCR: 12.5000 mg | EXTENDED_RELEASE_TABLET | Freq: Every evening | ORAL | 1 refills | Status: DC | PRN

## 2016-11-18 NOTE — Progress Notes (Signed)
CRITICAL VALUE ALERT Critical value received:  K- 2.6 Date of notification:  11/18/16 Time of notification: [id number] Critical value read back:  Yes.   Nurse who received alert:  M.Ashante Snelling, LPN MD notified (1st page):  T.Kefalas, PA-C

## 2016-11-18 NOTE — Progress Notes (Signed)
[place], [place] [address]  Low grade mucinous neoplasm of appendix - Plan: CBC with Differential, Comprehensive metabolic panel  Insomnia, unspecified type - Plan: zolpidem (AMBIEN CR) 12.5 MG CR tablet  Hypokalemia - Plan: potassium chloride SA (K-DUR,KLOR-CON) 20 MEQ tablet, Potassium, Magnesium  CURRENT THERAPY: Surveillance  INTERVAL HISTORY: Casey [name] 55 y.o. female returns for followup of Low grade, mucinous adenocarcinoma of appendix, S/P right hemicolectomy by Dr. [name] on 02/02/2016.  Now S/P CRS (greater and lesser omentectomy,bilateral salpingo-oophorectomy, peritoneal pelvic stripping and resection of diaphragmatic nodules)/HIPEC on 03/29/2016 by Dr. Johney Maine.  She is doing much better.  She is starting to accept her "new normal" which includes numerous loose stools throughout the day and even night time BM(s).    She wishes to return to work which is appropriate.  She thinks she can manage and her workplace has provided her with some special accommodations which will hopefully help her succeed in getting back to work (classroom size is down to [employer], her assigned room is next to a restroom, etc).  She wants to get back to a "normal life."  She voiced a few complaints that are not concerning from an oncology perspective.  She is recovering nicely.  Her spirits and emotional status is much improved compared to her last visit 3 months ago.  Chart is reviewed and ED visit in November 2017 is noted for abdominal pain.  CT Imaging was performed on 11/12 in the ED and was negative for any acute findings, in particular concerns for malignancy.  Review of Systems  Constitutional: Negative.  Negative for chills, fever and weight loss.  HENT: Negative.   Eyes: Negative.   Respiratory: Negative.  Negative for cough.   Cardiovascular: Negative.  Negative for chest pain.  Gastrointestinal: Positive for abdominal pain and  diarrhea. Negative for blood in stool, constipation, melena, nausea and vomiting.  Skin: Negative.   Neurological: Negative.  Negative for weakness and headaches.  Endo/Heme/Allergies: Negative.   Psychiatric/Behavioral: Negative.     Past Medical History:  Diagnosis Date  . Anemia   . Anxiety   . Cancer ([place])    premelanoma per hip   . Cancer of appendix ([place])   . Depression   . DUB (dysfunctional uterine bleeding)   . Dysrhythmia    pt states has PVCs   . GERD (gastroesophageal reflux disease)   . Graves disease   . History of blood transfusion   . History of urinary tract infection    last one approx 4 years ago   . Hyperlipidemia   . Hypertension   . 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 Migraines   . Ovarian cyst    both ovaries  . Palpitations   . Pneumonia    hx of had approx 22 years ago   . PONV (postoperative nausea and vomiting)   . Pulmonary nodule     Past Surgical History:  Procedure Laterality Date  . APPENDECTOMY    . DIAGNOSTIC LAPAROSCOPY    . DILATION AND CURETTAGE OF UTERUS    . infertility testing     . LAPAROSCOPIC PARTIAL COLECTOMY N/A 02/02/2016   Procedure: LAPAROSCOPIC PARTIAL COlectomy right colon;  Surgeon: Greer Pickerel, MD;  Location: [facility];  Service: General;  Laterality: N/A;  . OOPHORECTOMY     & fallopian tubes  . TONSILLECTOMY    . VAGINAL HYSTERECTOMY    .  VESICOVAGINAL FISTULA CLOSURE W/ TAH     Dr. Margaretha Glassing  2 degree DUB     Family History  Problem Relation Age of Onset  . Hypertension Mother   . Irregular heart beat Mother   . Thyroid disease Maternal Grandmother   . Cancer Maternal Grandmother     lung  . Cancer Paternal Grandfather     lung    Social History   Social History  . Marital status: Married    Spouse name: N/A  . Number of children: N/A  . Years of education: N/A   Social History Main Topics  . Smoking status: Never Smoker  . Smokeless tobacco: Never Used  . Alcohol  use No  . Drug use: No  . Sexual activity: Not Currently    Partners: Male    Birth control/ protection: Surgical     Comment: TVH   Other Topics Concern  . None   Social History Narrative  . None     PHYSICAL EXAMINATION  ECOG PERFORMANCE STATUS: 1 - Symptomatic but completely ambulatory  Vitals:   11/18/16 1508  BP: 111/65  Pulse: 67  Resp: 16  Temp: 98.3 F (36.8 C)    GENERAL:alert, well nourished, well developed, comfortable, cooperative, smiling and accompanied by husband SKIN: skin color, texture, turgor are normal, no rashes or significant lesions HEAD: Normocephalic, No masses, lesions, tenderness or abnormalities EYES: normal, EOMI, Conjunctiva are pink and non-injected EARS: External ears normal OROPHARYNX:lips, buccal mucosa, and tongue normal and mucous membranes are moist  NECK: supple, trachea midline LYMPH:  no palpable lymphadenopathy BREAST:not examined LUNGS: clear to auscultation  HEART: regular rate & rhythm ABDOMEN:abdomen soft, non-tender, obese and normal bowel sounds BACK: Back symmetric, no curvature., No CVA tenderness EXTREMITIES:less then 2 second capillary refill, no joint deformities, effusion, or inflammation, no skin discoloration, no cyanosis  NEURO: alert & oriented x 3 with fluent speech, no focal motor/sensory deficits, gait normal  LABORATORY DATA: CBC    Component Value Date/Time   WBC 7.7 11/18/2016 1405   RBC 3.95 11/18/2016 1405   HGB 12.1 11/18/2016 1405   HCT 36.0 11/18/2016 1405   HCT 34.5 05/27/2016 0902   PLT 260 11/18/2016 1405   PLT 265 05/27/2016 0902   MCV 91.1 11/18/2016 1405   MCV 88 05/27/2016 0902   MCH 30.6 11/18/2016 1405   MCHC 33.6 11/18/2016 1405   RDW 13.3 11/18/2016 1405   RDW 15.2 05/27/2016 0902   LYMPHSABS 2.6 11/18/2016 1405   LYMPHSABS 2.2 05/27/2016 0902   MONOABS 0.5 11/18/2016 1405   EOSABS 0.2 11/18/2016 1405   EOSABS 0.2 05/27/2016 0902   BASOSABS 0.1 11/18/2016 1405   BASOSABS  0.1 05/27/2016 0902      Chemistry      Component Value Date/Time   NA 141 11/18/2016 1405   NA 145 (H) 10/25/2016 1250   K 2.6 (LL) 11/18/2016 1405   CL 102 11/18/2016 1405   CO2 32 11/18/2016 1405   BUN 23 (H) 11/18/2016 1405   BUN 18 10/25/2016 1250   CREATININE 1.44 (H) 11/18/2016 1405   CREATININE 1.01 03/28/2013 1621      Component Value Date/Time   CALCIUM 9.2 11/18/2016 1405   ALKPHOS 49 11/18/2016 1405   AST 19 11/18/2016 1405   ALT 19 11/18/2016 1405   BILITOT 0.4 11/18/2016 1405   BILITOT 0.2 10/25/2016 1250        PENDING LABS:   RADIOGRAPHIC STUDIES:  No results found.  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 [name] on 02/02/2016.  Now S/P CRS (greater and lesser omentectomy,bilateral salpingo-oophorectomy, peritoneal pelvic stripping and resection of diaphragmatic nodules)/HIPEC on 03/29/2016 by Dr. Johney Maine.  Labs today: CBC diff, CMET.  I personally reviewed and went over laboratory results with the patient.  The results are noted within this dictation.  Significant hypokalemia is noted at 2.6.    Rx is printed for Kdur 60 mEq daily.  She will return for K+ and Magnesium check next week.    Further directions regarding her K supplementation will be provided and then I will defer further follow-up and management to her primary care provider.  She is on a HCTZ containing antihypertensive.  Complaints: 1. Diarrhea- she is intolerant to Imodium (causes increased bloating and abdominal pain).  Likely secondary to right hemicolectomy.  She is starting to accept that this may her new normal. 2. Fatigue- which may be from deconditioning 3. Rare epigastric discomfort- noted after lifting her grandchild.  Lasted for short time and has since resolved. 4. Issues with sleeping- she is using Ambien and that helps.  She has tried to not use  Ambien and she cannot sleep.  She has a difficult time "shutting my brain down" without Ambien.  At some point in time, we will transition this aspect of her care to her primary care provider.  She is confident in trying to return to work which is absolutely reasonable at this time.  She notes accommodations have been provided for her at work to succeed.  She requests a letter and one is provided to the patient today stating that she can return to work without restrictions on 11/29/2016.  We had a frank conversation about her diarrhea.  She is advised that this might be her new normal.  She is starting to accept that this may be factual and is planning accordingly.  Follow-up at [facility] with Dr. Johney Maine which will also include re-imaging in 3 months (as planned).  Return in 6 months for follow-up.   ORDERS PLACED FOR THIS ENCOUNTER: Orders Placed This Encounter  Procedures  . CBC with Differential  . Comprehensive metabolic panel  . Potassium  . Magnesium    MEDICATIONS PRESCRIBED THIS ENCOUNTER: Meds ordered this encounter  Medications  . zolpidem (AMBIEN CR) 12.5 MG CR tablet    Sig: Take 1 tablet (12.5 mg total) by mouth at bedtime as needed for sleep.    Dispense:  30 tablet    Refill:  1    Order Specific Question:   Supervising Provider    Answer:   Patrici Ranks [id number]  . folic acid (FOLVITE) 1 MG tablet    Sig: TAKE ONE (1) TABLET EACH DAY    Dispense:  30 tablet    Refill:  5    Order Specific Question:   Supervising Provider    Answer:   Patrici Ranks [id number]  . potassium chloride SA (K-DUR,KLOR-CON) 20 MEQ tablet    Sig: Take 3 tablets (60 mEq total) by mouth daily.    Dispense:  90 tablet    Refill:  0    Order Specific Question:   Supervising Provider    Answer:   Patrici Ranks [id number]    THERAPY PLAN:  Ongoing surveillance.  All questions were answered. The patient knows to call the clinic with any problems, questions or concerns. We  can certainly see the patient much sooner if necessary.  Patient and plan discussed with Dr. Ancil Linsey and she is in agreement with the aforementioned.   This note is electronically signed by: Robynn Pane, PA-C 11/18/2016 6:00 PM

## 2016-11-18 NOTE — Assessment & Plan Note (Addendum)
Low grade, mucinous adenocarcinoma of appendix, S/P right hemicolectomy by Dr. [name] on 02/02/2016.  Now S/P CRS (greater and lesser omentectomy,bilateral salpingo-oophorectomy, peritoneal pelvic stripping and resection of diaphragmatic nodules)/HIPEC on 03/29/2016 by Dr. Johney Maine.  Labs today: CBC diff, CMET.  I personally reviewed and went over laboratory results with the patient.  The results are noted within this dictation.  Significant hypokalemia is noted at 2.6.    Rx is printed for Kdur 60 mEq daily.  She will return for K+ and Magnesium check next week.    Further directions regarding her K supplementation will be provided and then I will defer further follow-up and management to her primary care provider.  She is on a HCTZ containing antihypertensive.  Complaints: 1. Diarrhea- she is intolerant to Imodium (causes increased bloating and abdominal pain).  Likely secondary to right hemicolectomy.  She is starting to accept that this may her new normal. 2. Fatigue- which may be from deconditioning 3. Rare epigastric discomfort- noted after lifting her grandchild.  Lasted for short time and has since resolved. 4. Issues with sleeping- she is using Ambien and that helps.  She has tried to not use Ambien and she cannot sleep.  She has a difficult time "shutting my brain down" without Ambien.  At some point in time, we will transition this aspect of her care to her primary care provider.  She is confident in trying to return to work which is absolutely reasonable at this time.  She notes accommodations have been provided for her at work to succeed.  She requests a letter and one is provided to the patient today stating that she can return to work without restrictions on 11/29/2016.  We had a frank conversation about her diarrhea.  She is advised that this might be her new normal.  She is starting to accept that this may be factual and is planning accordingly.  I have refilled her Folic Acid and  Ambien.  Follow-up at [facility] with Dr. Johney Maine which will also include re-imaging in 3 months (as planned).  Return in 6 months for follow-up.

## 2016-11-18 NOTE — Patient Instructions (Signed)
[place] at [facility] Discharge Instructions  RECOMMENDATIONS MADE BY THE CONSULTANT AND ANY TEST RESULTS WILL BE SENT TO YOUR REFERRING PHYSICIAN.  You saw Kirby Crigler, PA-C, today. Follow up with Dr. Whitney Muse and labs in 6 months. Follow up at [facility] in 3 months as directed. Return to work note and prescriptions given. Recheck potassium next week. Take 60 meq of potassium daily until labs are re-checked. See Amy at checkout for appointments.  Thank you for choosing [place] at [facility] to provide your oncology and hematology care.  To afford each patient quality time with our provider, please arrive at least 15 minutes before your scheduled appointment time.    If you have a lab appointment with the [place] please come in thru the  Main Entrance and check in at the main information desk  You need to re-schedule your appointment should you arrive 10 or more minutes late.  We strive to give you quality time with our providers, and arriving late affects you and other patients whose appointments are after yours.  Also, if you no show three or more times for appointments you may be dismissed from the clinic at the providers discretion.     Again, thank you for choosing [facility].  Our hope is that these requests will decrease the amount of time that you wait before being seen by our physicians.       _____________________________________________________________  Should you have questions after your visit to [facility], please contact our office at [phone] between the hours of 8:30 a.m. and 4:30 p.m.  Voicemails left after 4:30 p.m. will not be returned until the following business day.  For prescription refill requests, have your pharmacy contact our office.       Resources For Cancer Patients and their Caregivers ? American Cancer Society: Can assist with transportation, wigs, general needs,  runs Look Good Feel Better.        336-736-8807 ? Cancer Care: Provides financial assistance, online support groups, medication/co-pay assistance.  1-800-813-HOPE (252) 577-9801) ? [place] Assists [place] cancer patients and their families through emotional , educational and financial support.  507-741-9158 ? [place] DSS Where to apply for food stamps, Medicaid and utility assistance. 408 116 8584 ? [facility]: Transportation to medical appointments. (680) 143-6806 ? Social Security Administration: May apply for disability if have a Stage IV cancer. 279-336-5271 478 862 3612 ? [employer], Disability and Transit Services: Assists with nutrition, care and transit needs. [place] Support Programs: @10RELATIVEDAYS @ > Cancer Support Group  2nd Tuesday of the month 1pm-2pm, Journey Room  > Creative Journey  3rd Tuesday of the month 1130am-1pm, Journey Room  > Look Good Feel Better  1st Wednesday of the month 10am-12 noon, Journey Room (Call [place] to register [phone])

## 2016-11-25 ENCOUNTER — Encounter (HOSPITAL_COMMUNITY): Payer: Commercial Managed Care - HMO | Attending: Hematology & Oncology

## 2016-11-25 DIAGNOSIS — E876 Hypokalemia: Secondary | ICD-10-CM | POA: Diagnosis present

## 2016-11-25 LAB — MAGNESIUM: MAGNESIUM: 1.6 mg/dL — AB (ref 1.7–2.4)

## 2016-11-25 LAB — POTASSIUM: Potassium: 3.3 mmol/L — ABNORMAL LOW (ref 3.5–5.1)

## 2016-12-20 ENCOUNTER — Other Ambulatory Visit (HOSPITAL_COMMUNITY): Payer: Commercial Managed Care - HMO

## 2016-12-23 ENCOUNTER — Other Ambulatory Visit: Payer: Self-pay | Admitting: Nurse Practitioner

## 2016-12-27 ENCOUNTER — Telehealth (HOSPITAL_COMMUNITY): Payer: Self-pay

## 2016-12-27 NOTE — Telephone Encounter (Signed)
-----   Message from Louis Meckel sent at 12/27/2016  9:11 AM EST ----- Regarding: patient hurting Patients husband called and stated that his wife is hurting in her side.  Please call her on her cell

## 2016-12-27 NOTE — Telephone Encounter (Signed)
Patient states she has been having pain in her right side, above umbilicus at the bottom of her ribs for 1 week. Last week it was a throbbing pain. This week she states the pain is better. It is just sore now. She does think the area is swollen. She has not been sick, had no fever, or started any new medications. No changes in her bowel or bladder habits or appetite. She is taking Ibuprofen 600 mg for pain. Reviewed with PA-C, he said since she is scheduled for follow up in March at [facility] with scans, she can wait until then or [place] that area now. It is up to the patient. Explained above to patient. She states since the pain is better she will wait until scans unless the pain worsens. If pain worsens, she will call and schedule [place].

## 2017-01-14 ENCOUNTER — Encounter: Payer: Self-pay | Admitting: Nurse Practitioner

## 2017-01-14 ENCOUNTER — Ambulatory Visit (INDEPENDENT_AMBULATORY_CARE_PROVIDER_SITE_OTHER): Payer: Commercial Managed Care - HMO | Admitting: Nurse Practitioner

## 2017-01-14 VITALS — BP 116/71 | HR 61 | Temp 97.2°F | Ht 69.0 in | Wt 201.0 lb

## 2017-01-14 DIAGNOSIS — R5383 Other fatigue: Secondary | ICD-10-CM | POA: Diagnosis not present

## 2017-01-14 DIAGNOSIS — K591 Functional diarrhea: Secondary | ICD-10-CM | POA: Diagnosis not present

## 2017-01-14 NOTE — Patient Instructions (Signed)
Hypokalemia Hypokalemia means that the amount of potassium in the blood is lower than normal.Potassium is a chemical that helps regulate the amount of fluid in the body (electrolyte). It also stimulates muscle tightening (contraction) and helps nerves work properly.Normally, most of the body's potassium is inside of cells, and only a very small amount is in the blood. Because the amount in the blood is so small, minor changes to potassium levels in the blood can be life-threatening. What are the causes? This condition may be caused by:  Antibiotic medicine.  Diarrhea or vomiting. Taking too much of a medicine that helps you have a bowel movement (laxative) can cause diarrhea and lead to hypokalemia.  Chronic kidney disease (CKD).  Medicines that help the body get rid of excess fluid (diuretics).  Eating disorders, such as bulimia.  Low magnesium levels in the body.  Sweating a lot. What are the signs or symptoms? Symptoms of this condition include:  Weakness.  Constipation.  Fatigue.  Muscle cramps.  Mental confusion.  Skipped heartbeats or irregular heartbeat (palpitations).  Tingling or numbness. How is this diagnosed? This condition is diagnosed with a blood test. How is this treated? Hypokalemia can be treated by taking potassium supplements by mouth or adjusting the medicines that you take. Treatment may also include eating more foods that contain a lot of potassium. If your potassium level is very low, you may need to get potassium through an IV tube in one of your veins and be monitored in the hospital. Follow these instructions at home:  Take over-the-counter and prescription medicines only as told by your health care provider. This includes vitamins and supplements.  Eat a healthy diet. A healthy diet includes fresh fruits and vegetables, whole grains, healthy fats, and lean proteins.  If instructed, eat more foods that contain a lot of potassium, such  as:  Nuts, such as peanuts and pistachios.  Seeds, such as sunflower seeds and pumpkin seeds.  Peas, lentils, and lima beans.  Whole grain and bran cereals and breads.  Fresh fruits and vegetables, such as apricots, avocado, bananas, cantaloupe, kiwi, oranges, tomatoes, asparagus, and potatoes.  Orange juice.  Tomato juice.  Red meats.  Yogurt.  Keep all follow-up visits as told by your health care provider. This is important. Contact a health care provider if:  You have weakness that gets worse.  You feel your heart pounding or racing.  You vomit.  You have diarrhea.  You have diabetes (diabetes mellitus) and you have trouble keeping your blood sugar (glucose) in your target range. Get help right away if:  You have chest pain.  You have shortness of breath.  You have vomiting or diarrhea that lasts for more than 2 days.  You faint. This information is not intended to replace advice given to you by your health care provider. Make sure you discuss any questions you have with your health care provider. Document Released: 11/08/2005 Document Revised: 06/26/2016 Document Reviewed: 06/26/2016 Elsevier Interactive Patient Education  2017 Elsevier Inc.  

## 2017-01-14 NOTE — Progress Notes (Signed)
   Subjective:    Patient ID: Casey [name], female    DOB: [birth year], 56 y.o.   MRN: [id number]  HPI Patient comes in today c/o increasing fatigue and headache, with frequent palpitations. The last time she felt like this her potassium was low. SHe was on 3 potassium tablets a day at one time and now she is on none. SHe still has at least 3 diarrhea stools a day since she had treatment for apendiceal cancer.    Review of Systems  Constitutional: Positive for fatigue.  HENT: Negative.   Respiratory: Negative.   Cardiovascular: Positive for palpitations.  Gastrointestinal: Negative.   Genitourinary: Negative.   Neurological: Positive for headaches.  Psychiatric/Behavioral: Negative.   All other systems reviewed and are negative.      Objective:   Physical Exam  Constitutional: She is oriented to person, place, and time. She appears well-developed and well-nourished. No distress.  Cardiovascular: Normal rate and regular rhythm.   Pulmonary/Chest: Effort normal and breath sounds normal.  Abdominal: Soft. There is tenderness (diffuse- no differnet then has been since cancer treatment.).  Neurological: She is alert and oriented to person, place, and time.  Skin: Skin is warm.  Psychiatric: She has a normal mood and affect. Her behavior is normal. Judgment and thought content normal.   BP 116/71   Pulse 61   Temp 97.2 F (36.2 C) (Oral)   Ht '5\' 9"'$  (1.753 m)   Wt 201 lb (91.2 kg)   LMP 05/23/1995   BMI 29.68 kg/m         Assessment & Plan:   1. Fatigue, unspecified type   2. Functional diarrhea    Orders Placed This Encounter  Procedures  . CMP14+EGFR  . Thyroid Panel With TSH   Will cal with lab results  [place], FNP

## 2017-01-15 LAB — CMP14+EGFR
ALBUMIN: 4.6 g/dL (ref 3.5–5.5)
ALK PHOS: 42 IU/L (ref 39–117)
ALT: 17 IU/L (ref 0–32)
AST: 18 IU/L (ref 0–40)
Albumin/Globulin Ratio: 2.1 (ref 1.2–2.2)
BUN / CREAT RATIO: 21 (ref 9–23)
BUN: 27 mg/dL — ABNORMAL HIGH (ref 6–24)
Bilirubin Total: <0.2 mg/dL (ref 0.0–1.2)
CO2: 27 mmol/L (ref 18–29)
CREATININE: 1.27 mg/dL — AB (ref 0.57–1.00)
Calcium: 9.5 mg/dL (ref 8.7–10.2)
Chloride: 98 mmol/L (ref 96–106)
GFR calc Af Amer: 55 mL/min/{1.73_m2} — ABNORMAL LOW (ref >59)
GFR calc non Af Amer: 48 mL/min/{1.73_m2} — ABNORMAL LOW (ref >59)
GLOBULIN, TOTAL: 2.2 g/dL (ref 1.5–4.5)
Glucose: 87 mg/dL (ref 65–99)
Potassium: 2.9 mmol/L — ABNORMAL LOW (ref 3.5–5.2)
SODIUM: 141 mmol/L (ref 134–144)
Total Protein: 6.8 g/dL (ref 6.0–8.5)

## 2017-01-15 LAB — THYROID PANEL WITH TSH
Free Thyroxine Index: 2.4 (ref 1.2–4.9)
T3 Uptake Ratio: 27 % (ref 24–39)
T4, Total: 8.8 ug/dL (ref 4.5–12.0)
TSH: 4.39 u[IU]/mL (ref 0.450–4.500)

## 2017-01-24 ENCOUNTER — Other Ambulatory Visit: Payer: Self-pay | Admitting: Nurse Practitioner

## 2017-01-24 ENCOUNTER — Other Ambulatory Visit (HOSPITAL_COMMUNITY): Payer: Self-pay | Admitting: Hematology & Oncology

## 2017-01-24 DIAGNOSIS — G4700 Insomnia, unspecified: Secondary | ICD-10-CM

## 2017-01-27 ENCOUNTER — Other Ambulatory Visit: Payer: Self-pay | Admitting: Nurse Practitioner

## 2017-01-27 ENCOUNTER — Other Ambulatory Visit: Payer: Self-pay | Admitting: *Deleted

## 2017-01-27 DIAGNOSIS — E876 Hypokalemia: Secondary | ICD-10-CM

## 2017-01-27 MED ORDER — POTASSIUM CHLORIDE CRYS ER 20 MEQ PO TBCR: 60.0000 meq | EXTENDED_RELEASE_TABLET | Freq: Every day | ORAL | 2 refills | Status: DC

## 2017-01-28 ENCOUNTER — Other Ambulatory Visit (INDEPENDENT_AMBULATORY_CARE_PROVIDER_SITE_OTHER): Payer: Commercial Managed Care - HMO

## 2017-01-28 DIAGNOSIS — E876 Hypokalemia: Secondary | ICD-10-CM | POA: Diagnosis not present

## 2017-01-29 LAB — BMP8+EGFR
BUN / CREAT RATIO: 18 (ref 9–23)
BUN: 22 mg/dL (ref 6–24)
CHLORIDE: 104 mmol/L (ref 96–106)
CO2: 24 mmol/L (ref 18–29)
Calcium: 9 mg/dL (ref 8.7–10.2)
Creatinine, Ser: 1.22 mg/dL — ABNORMAL HIGH (ref 0.57–1.00)
GFR, EST AFRICAN AMERICAN: 58 mL/min/{1.73_m2} — AB (ref >59)
GFR, EST NON AFRICAN AMERICAN: 50 mL/min/{1.73_m2} — AB (ref >59)
Glucose: 102 mg/dL — ABNORMAL HIGH (ref 65–99)
Potassium: 3.5 mmol/L (ref 3.5–5.2)
Sodium: 143 mmol/L (ref 134–144)

## 2017-02-23 ENCOUNTER — Other Ambulatory Visit (HOSPITAL_COMMUNITY): Payer: Self-pay | Admitting: Hematology & Oncology

## 2017-02-23 ENCOUNTER — Other Ambulatory Visit: Payer: Self-pay | Admitting: Nurse Practitioner

## 2017-02-23 DIAGNOSIS — G4700 Insomnia, unspecified: Secondary | ICD-10-CM

## 2017-02-25 DIAGNOSIS — E7800 Pure hypercholesterolemia, unspecified: Secondary | ICD-10-CM | POA: Diagnosis not present

## 2017-02-25 DIAGNOSIS — Z08 Encounter for follow-up examination after completed treatment for malignant neoplasm: Secondary | ICD-10-CM | POA: Diagnosis not present

## 2017-02-25 DIAGNOSIS — C181 Malignant neoplasm of appendix: Secondary | ICD-10-CM | POA: Diagnosis not present

## 2017-02-25 DIAGNOSIS — Z85038 Personal history of other malignant neoplasm of large intestine: Secondary | ICD-10-CM | POA: Diagnosis not present

## 2017-02-25 DIAGNOSIS — R918 Other nonspecific abnormal finding of lung field: Secondary | ICD-10-CM | POA: Diagnosis not present

## 2017-02-25 DIAGNOSIS — I10 Essential (primary) hypertension: Secondary | ICD-10-CM | POA: Diagnosis not present

## 2017-03-12 IMAGING — US US SOFT TISSUE HEAD/NECK
1 series · 14 of 25 positions shown · non-contrast
Comparison: CT [DATE]

CLINICAL DATA: History of Graves disease, right nodule noted on
recent CT chest

EXAM:
THYROID ULTRASOUND
TECHNIQUE: Ultrasound examination of the thyroid gland and adjacent soft
tissues was performed.

[Series 1: us soft tissue head/neck · 0.06mm/px · 14 of 71 slices shown]
[im 1/71]
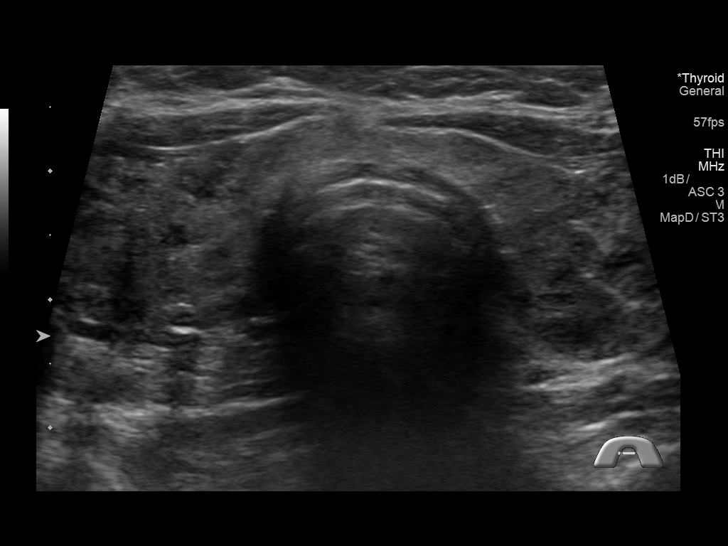
[im 6/71]
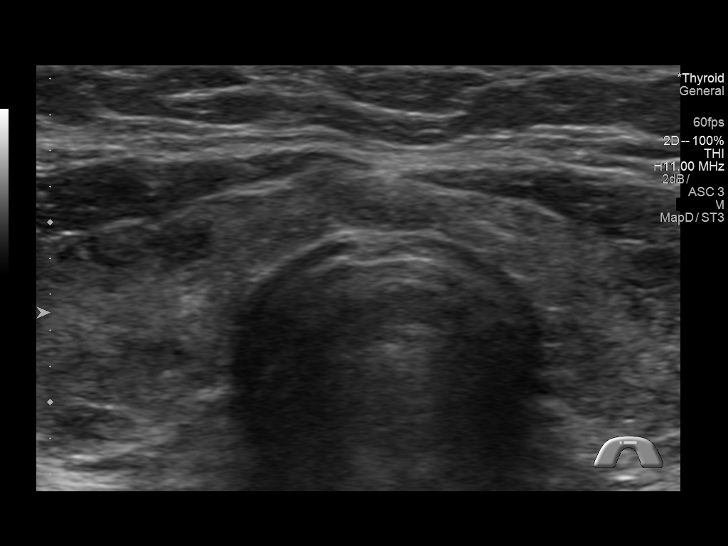
[im 12/71]
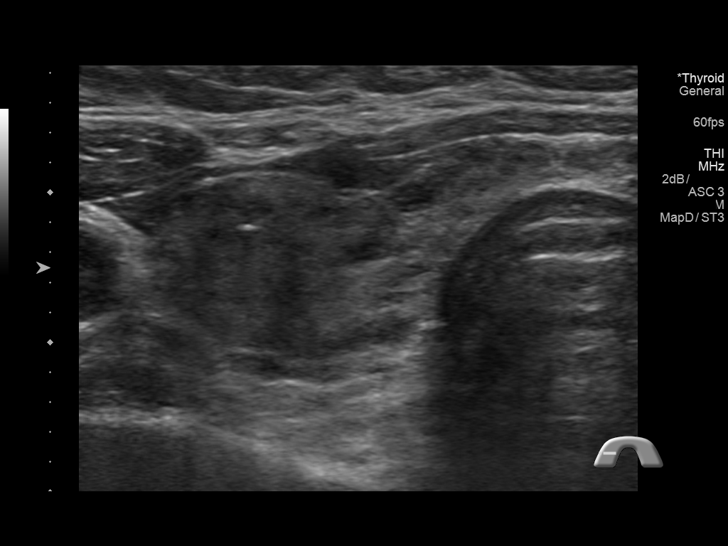
[im 18/71]
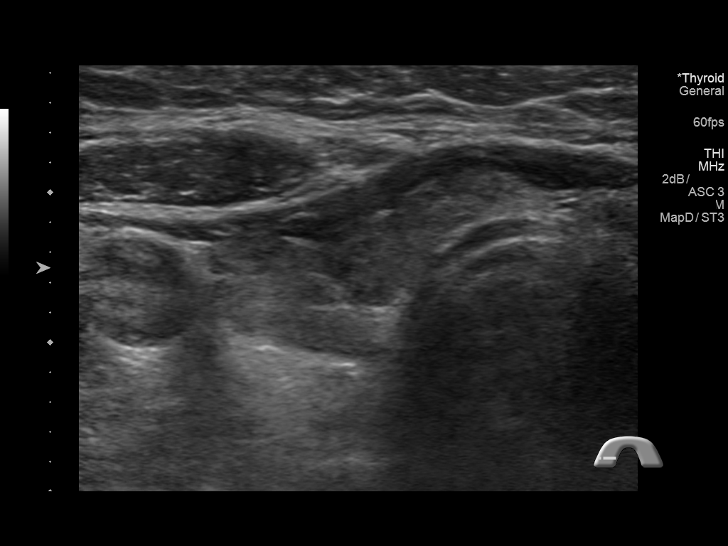
[im 24/71]
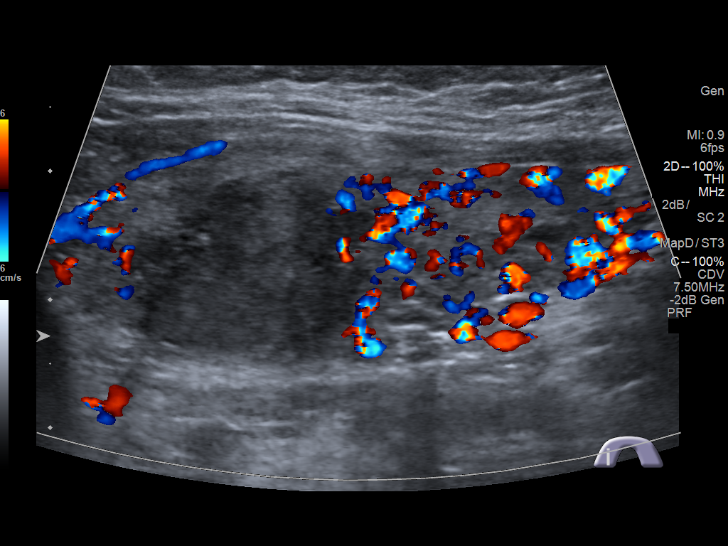
[im 27/71]
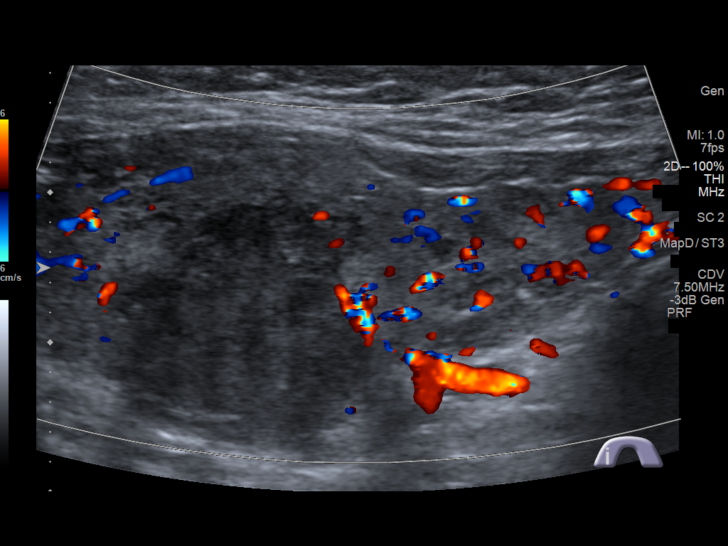
[im 33/71]
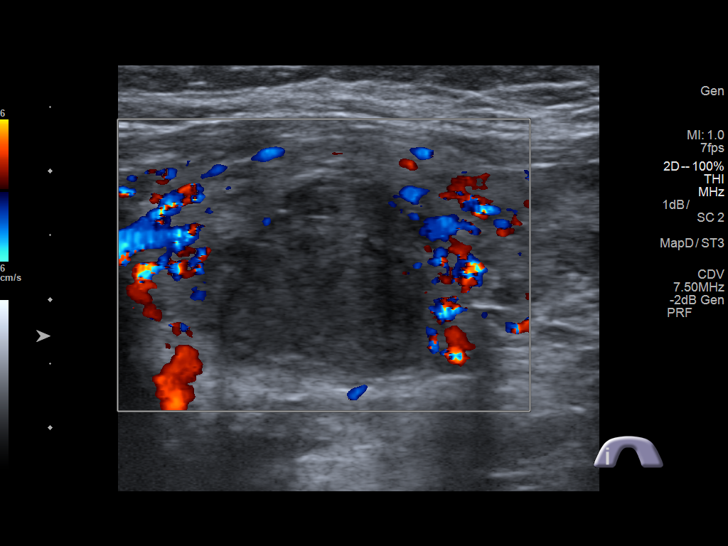
[im 38/71]
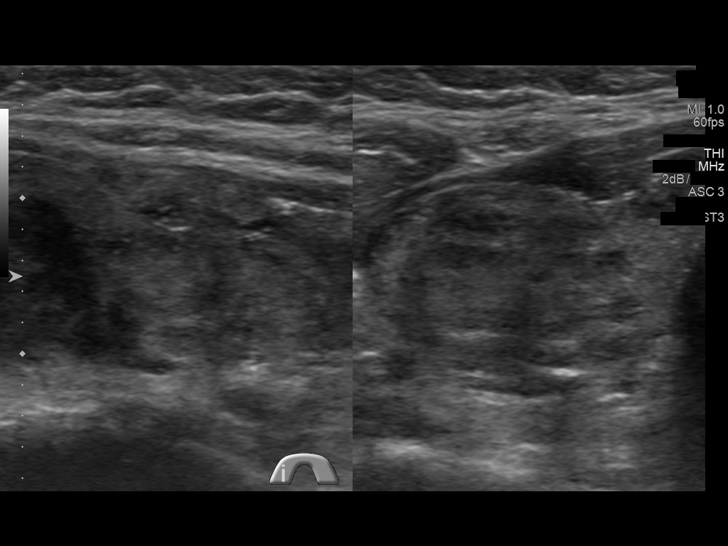
[im 44/71]
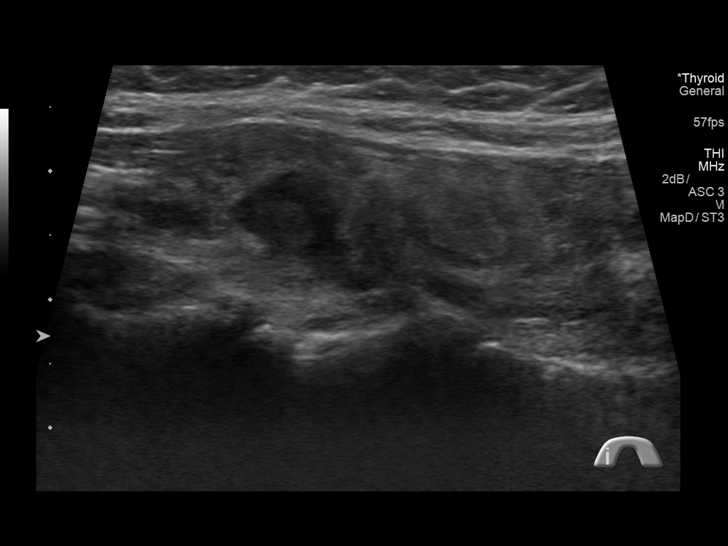
[im 47/71]
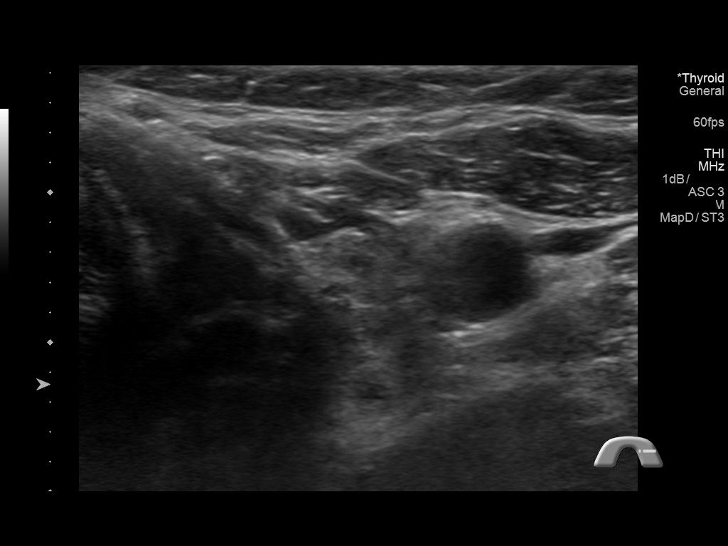
[im 53/71]
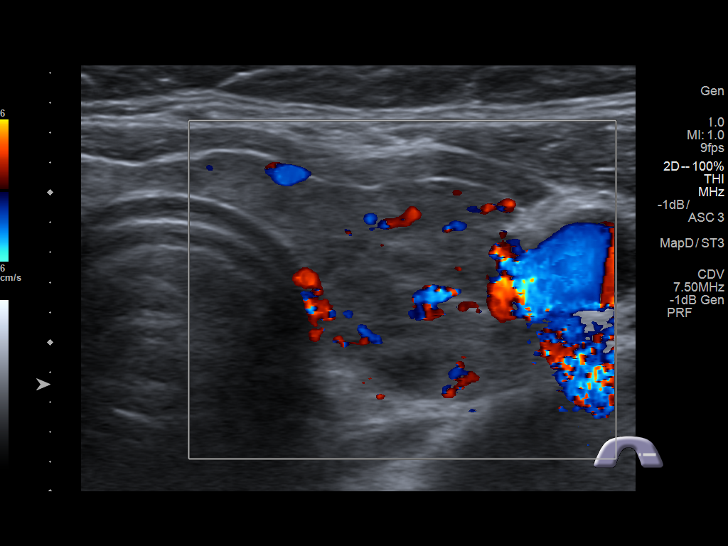
[im 59/71]
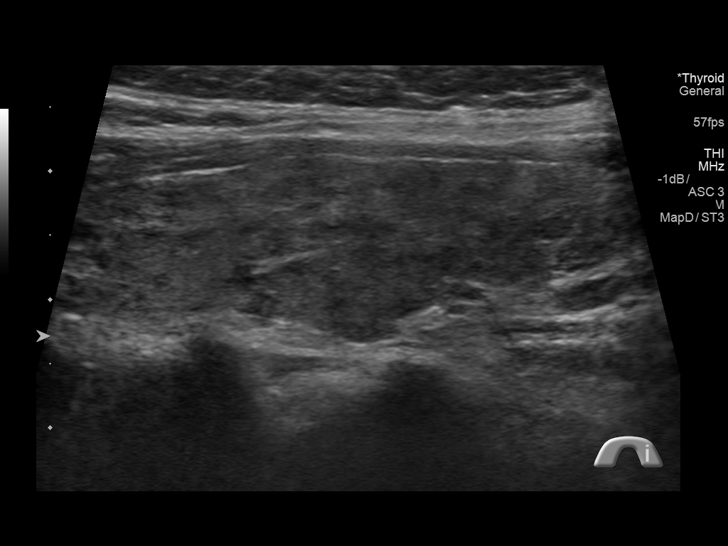
[im 65/71]
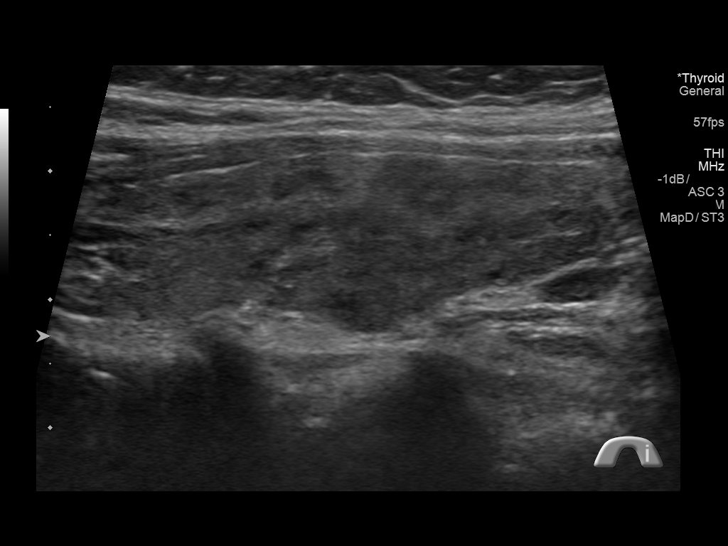
[im 71/71]
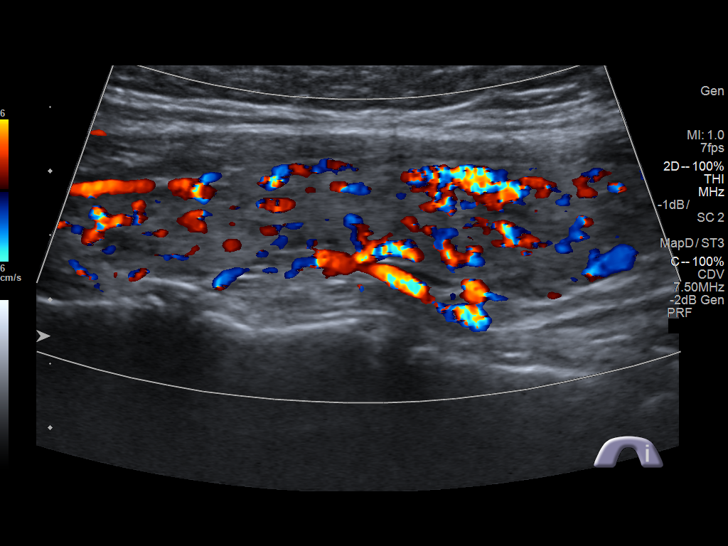

[14 of 25 positions shown; findings below may reference images not displayed]

FINDINGS: Right thyroid lobe

Measurements: 4.9 x 2 x 2 cm. 1.9 x 1.6 x 1.5 cm hypoechoic solid
nodule, mid lobe. 0.8 x 0.6 x 0.7 cm solid nodule, inferior pole.

Left thyroid lobe

Measurements: 5 x 1.5 x 1.6 cm. Hyperemic somewhat inhomogeneous
parenchyma without focal lesion.

Isthmus

Thickness: 0.4 cm.  No nodules visualized.

Lymphadenopathy

None visualized.
IMPRESSION: 1. Mild thyromegaly with right nodules. The 19 mm lesion meets
consensus criteria for biopsy. Ultrasound-guided fine needle
aspiration should be considered, as per the consensus statement:
Management of Thyroid Nodules Detected at US: Society of
Radiologists in Ultrasound Consensus Conference Statement. Radiology

## 2017-03-20 IMAGING — US US THYROID BIOPSY
1 series · 12 of 12 positions shown · non-contrast
Comparison: Thyroid ultrasound [DATE]

INDICATION: RIGHT thyroid nodule

EXAM:
ULTRASOUND GUIDED NEEDLE ASPIRATE BIOPSY OF THE THYROID GLAND

[Series 1: us thyroid biopsy · 0.06mm/px · 12 acquisitions, 12 frames shown]
[im 1/12]
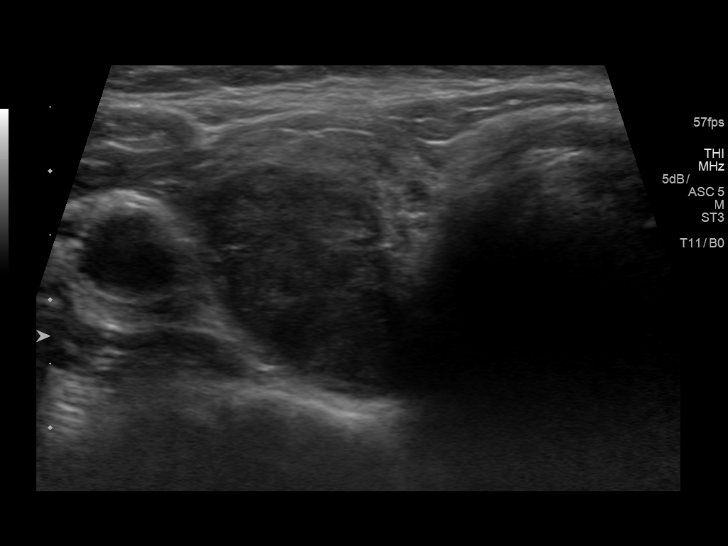
[im 2/12]
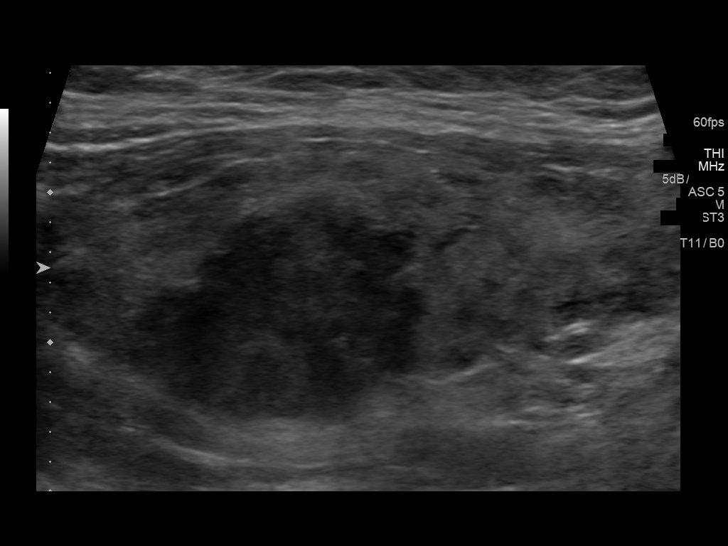
[im 3/12]
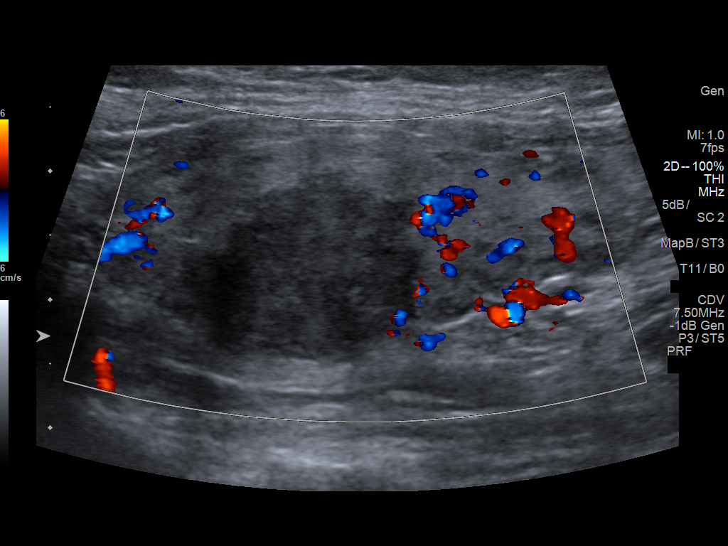
[im 4/12]
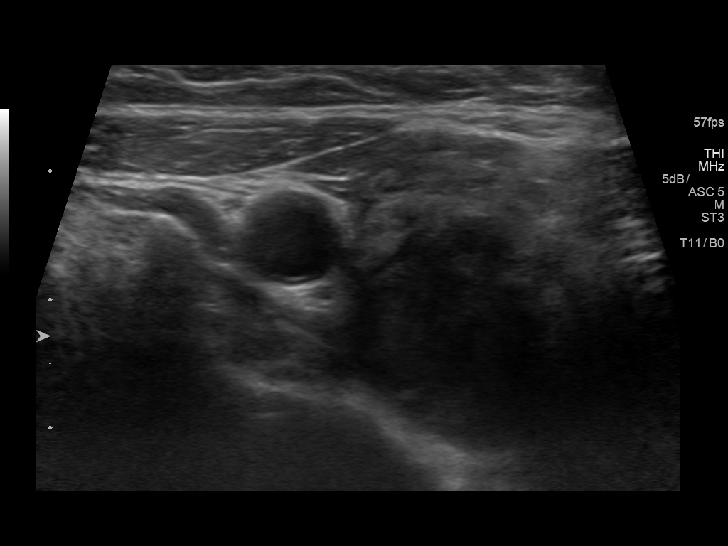
[im 5/12]
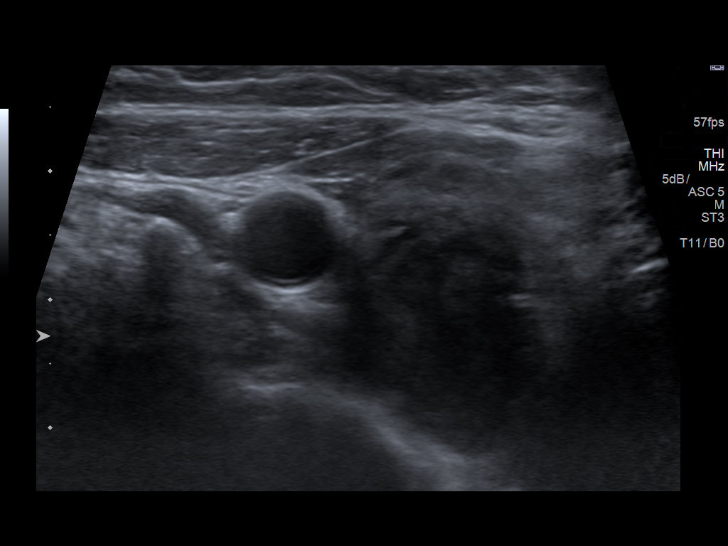
[im 6/12]
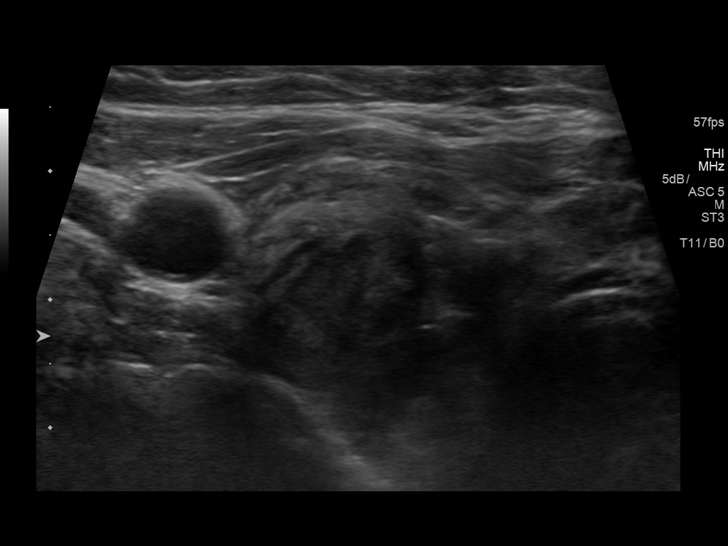
[im 7/12]
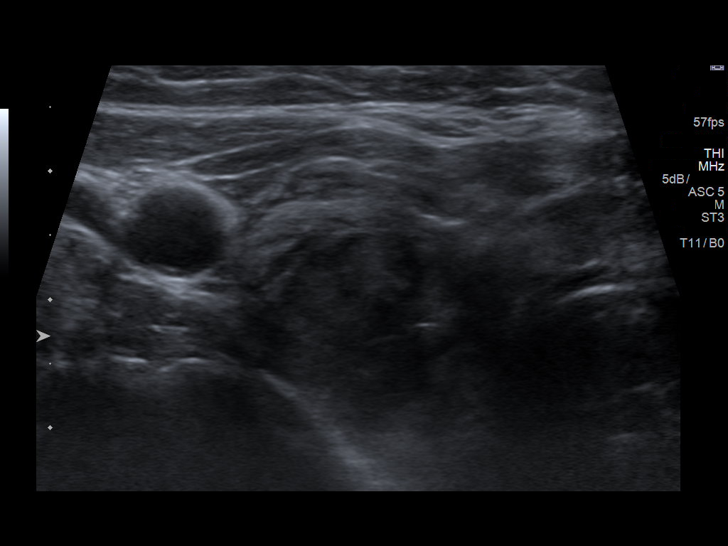
[im 8/12]
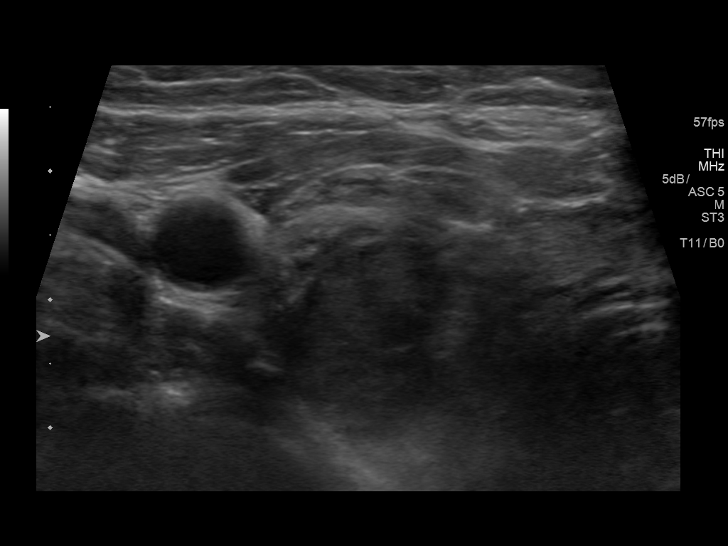
[im 9/12]
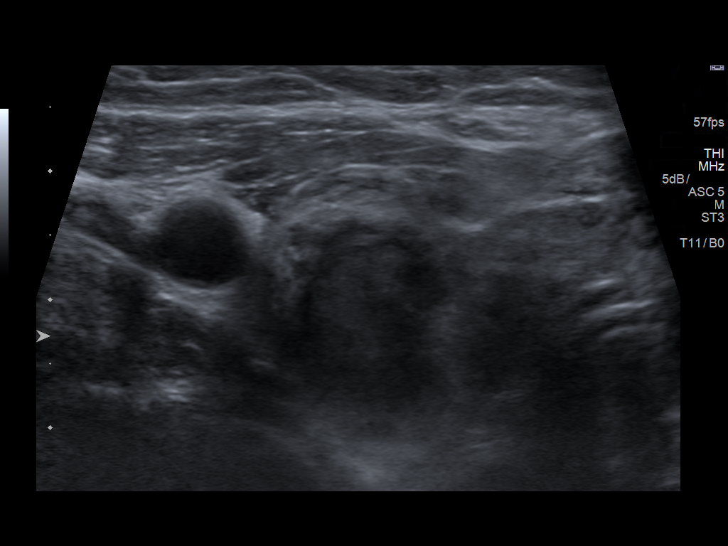
[im 10/12]
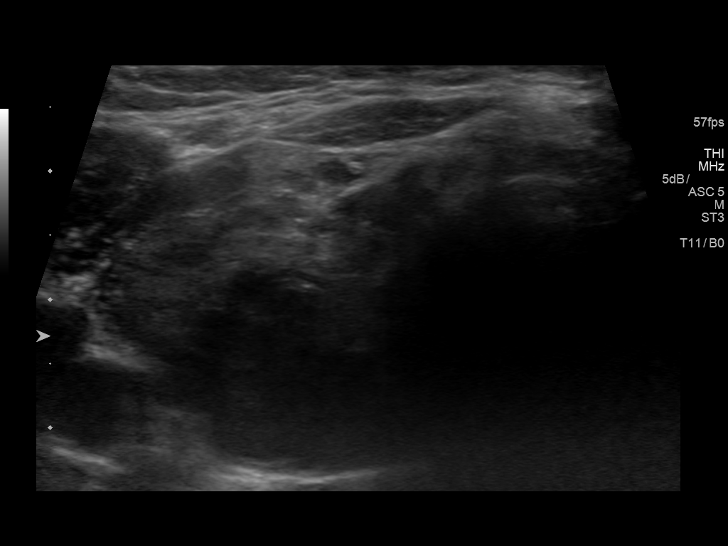
[im 11/12]
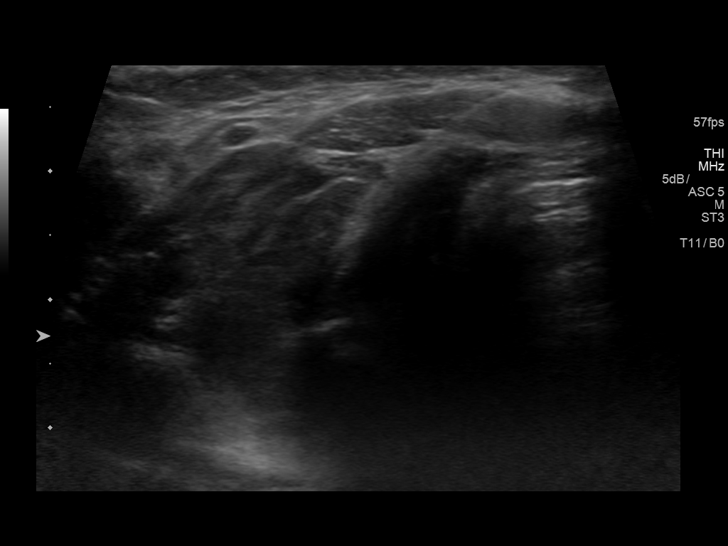
[im 12/12]
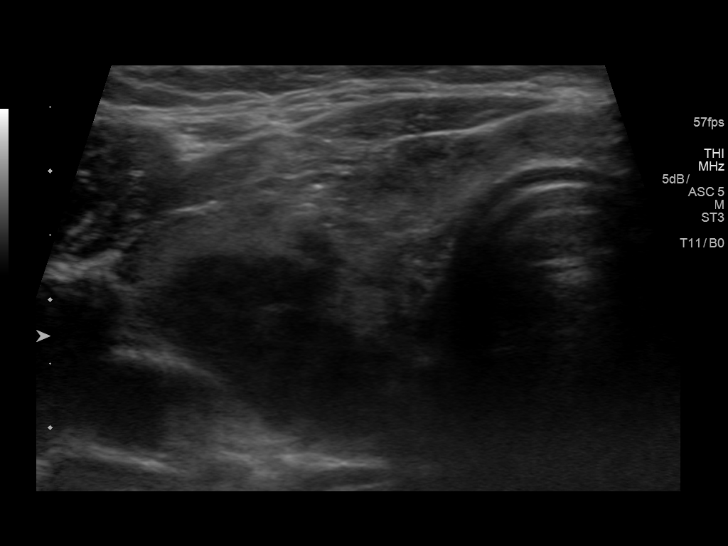

[12 of 12 positions shown; findings below may reference images not displayed]

MEDICATIONS:
2 ml of 2% lidocaine

COMPLICATIONS:
None immediate.

PROCEDURE:
Procedure, risks, benefits and alternatives discussed with the
patient.

Patient's questions answered.

Written informed consent for thyroid nodule FNA obtained.

Time-out protocol followed.

Dominant nodule in mid RIGHT thyroid lobe localized by ultrasound

Nodule measures 2.0 x 1.4 x 1.5 cm, complex hypoechoic.

Area prepped and draped in usual sterile fashion.

Skin and soft tissues anesthetized with 2 mL of 2% lidocaine.

Under direct sonographic visualization, 3 fine-needle aspirates of
the mid RIGHT lobe mass were obtained.

Procedure tolerated well by patient.

Specimen sent to cytology for evaluation.
IMPRESSION: Ultrasound guided needle aspirate biopsy performed of the RIGHT
thyroid nodule.

## 2017-03-25 ENCOUNTER — Other Ambulatory Visit: Payer: Self-pay | Admitting: Nurse Practitioner

## 2017-03-28 ENCOUNTER — Telehealth: Payer: Self-pay | Admitting: Nurse Practitioner

## 2017-03-28 NOTE — Telephone Encounter (Signed)
Apt made

## 2017-03-29 ENCOUNTER — Ambulatory Visit (INDEPENDENT_AMBULATORY_CARE_PROVIDER_SITE_OTHER): Payer: Commercial Managed Care - HMO | Admitting: Nurse Practitioner

## 2017-03-29 ENCOUNTER — Encounter: Payer: Self-pay | Admitting: Nurse Practitioner

## 2017-03-29 VITALS — BP 96/62 | HR 60 | Temp 97.2°F | Ht 69.0 in | Wt 203.0 lb

## 2017-03-29 DIAGNOSIS — R252 Cramp and spasm: Secondary | ICD-10-CM

## 2017-03-29 DIAGNOSIS — R5383 Other fatigue: Secondary | ICD-10-CM | POA: Diagnosis not present

## 2017-03-29 NOTE — Progress Notes (Signed)
   Subjective:    Patient ID: Casey [name], female    DOB: Nov 14, 1961, 56 y.o.   MRN: [id number]  Patient comes in with complaints of fatigue, muscle weakness, and muscle cramps in feet and legs.  She also complains of heart palpitations and dizziness.  Symptoms x 2 weeks.  Patient states she is taking potassium, but that she takes it more regularly during the week and often only remembers to take half her scheduled dose on the weekend.        Review of Systems  Constitutional: Positive for activity change and fatigue. Negative for appetite change.  HENT: Negative.   Eyes: Negative.   Respiratory: Negative.   Cardiovascular: Positive for palpitations.  Gastrointestinal: Positive for abdominal pain (cramping) and diarrhea (chronic problem).  Genitourinary: Negative.   Neurological: Positive for dizziness and light-headedness (orthostatic or with turning head quickly).  Psychiatric/Behavioral: Negative.        Objective:   Physical Exam  Constitutional: She is oriented to person, place, and time. She appears well-developed and well-nourished.  HENT:  Head: Normocephalic.  Mouth/Throat: Oropharynx is clear and moist.  Eyes: Pupils are equal, round, and reactive to light.  Neck: Normal range of motion. Neck supple.  Cardiovascular: Normal rate, regular rhythm and normal heart sounds.   Pulmonary/Chest: Effort normal and breath sounds normal.  Abdominal: Soft. Bowel sounds are normal. There is tenderness (chronic).  Musculoskeletal: Normal range of motion.  Neurological: She is alert and oriented to person, place, and time.  Skin: Skin is warm and dry.  Psychiatric: She has a normal mood and affect. Her behavior is normal. Judgment and thought content normal.    BP 96/62   Pulse 60   Temp 97.2 F (36.2 C) (Oral)   Ht 5' 9" (1.753 m)   Wt 203 lb (92.1 kg)   LMP 05/23/1995   BMI 29.98 kg/m      Assessment & Plan:  1. Leg cramping Checking labs for hypokalemia.  Patient  encouraged to rest when legs cramping. - CMP14+EGFR  2. Fatigue, unspecified type Labs being drawn and will follow-up with patient once resulted. - CBC with Differential/Platelet - Thyroid Panel With TSH    Marissa Hite, SNP [name] Hassell Done, FNP

## 2017-03-30 ENCOUNTER — Other Ambulatory Visit: Payer: Self-pay | Admitting: Nurse Practitioner

## 2017-03-30 LAB — THYROID PANEL WITH TSH
Free Thyroxine Index: 2.8 (ref 1.2–4.9)
T3 Uptake Ratio: 29 % (ref 24–39)
T4, Total: 9.5 ug/dL (ref 4.5–12.0)
TSH: 4.86 u[IU]/mL — ABNORMAL HIGH (ref 0.450–4.500)

## 2017-03-30 LAB — CMP14+EGFR
ALT: 18 IU/L (ref 0–32)
AST: 19 IU/L (ref 0–40)
Albumin/Globulin Ratio: 1.8 (ref 1.2–2.2)
Albumin: 4.5 g/dL (ref 3.5–5.5)
Alkaline Phosphatase: 50 IU/L (ref 39–117)
BUN / CREAT RATIO: 17 (ref 9–23)
BUN: 21 mg/dL (ref 6–24)
Bilirubin Total: 0.2 mg/dL (ref 0.0–1.2)
CO2: 27 mmol/L (ref 18–29)
CREATININE: 1.25 mg/dL — AB (ref 0.57–1.00)
Calcium: 9.3 mg/dL (ref 8.7–10.2)
Chloride: 102 mmol/L (ref 96–106)
GFR calc Af Amer: 56 mL/min/{1.73_m2} — ABNORMAL LOW (ref >59)
GFR, EST NON AFRICAN AMERICAN: 49 mL/min/{1.73_m2} — AB (ref >59)
GLUCOSE: 94 mg/dL (ref 65–99)
Globulin, Total: 2.5 g/dL (ref 1.5–4.5)
POTASSIUM: 3.3 mmol/L — AB (ref 3.5–5.2)
SODIUM: 143 mmol/L (ref 134–144)
TOTAL PROTEIN: 7 g/dL (ref 6.0–8.5)

## 2017-03-30 LAB — CBC WITH DIFFERENTIAL/PLATELET
Basophils Absolute: 0 10*3/uL (ref 0.0–0.2)
Basos: 1 %
EOS (ABSOLUTE): 0.2 10*3/uL (ref 0.0–0.4)
Eos: 3 %
Hematocrit: 34.6 % (ref 34.0–46.6)
Hemoglobin: 11.6 g/dL (ref 11.1–15.9)
Immature Grans (Abs): 0 10*3/uL (ref 0.0–0.1)
Immature Granulocytes: 0 %
Lymphocytes Absolute: 2.2 10*3/uL (ref 0.7–3.1)
Lymphs: 31 %
MCH: 29.3 pg (ref 26.6–33.0)
MCHC: 33.5 g/dL (ref 31.5–35.7)
MCV: 87 fL (ref 79–97)
Monocytes Absolute: 0.6 10*3/uL (ref 0.1–0.9)
Monocytes: 8 %
Neutrophils Absolute: 4.1 10*3/uL (ref 1.4–7.0)
Neutrophils: 57 %
Platelets: 258 10*3/uL (ref 150–379)
RBC: 3.96 x10E6/uL (ref 3.77–5.28)
RDW: 14.3 % (ref 12.3–15.4)
WBC: 7.1 10*3/uL (ref 3.4–10.8)

## 2017-03-30 MED ORDER — LEVOTHYROXINE SODIUM 100 MCG PO TABS: 100.0000 ug | ORAL_TABLET | Freq: Every day | ORAL | 5 refills | Status: DC

## 2017-03-30 MED ORDER — POTASSIUM CHLORIDE CRYS ER 20 MEQ PO TBCR: 20.0000 meq | EXTENDED_RELEASE_TABLET | Freq: Three times a day (TID) | ORAL | 5 refills | Status: DC

## 2017-04-27 ENCOUNTER — Other Ambulatory Visit: Payer: Self-pay | Admitting: Nurse Practitioner

## 2017-05-19 ENCOUNTER — Encounter (HOSPITAL_COMMUNITY): Payer: Commercial Managed Care - HMO | Attending: Oncology

## 2017-05-19 ENCOUNTER — Ambulatory Visit (HOSPITAL_COMMUNITY): Payer: Commercial Managed Care - HMO

## 2017-05-19 ENCOUNTER — Encounter (HOSPITAL_COMMUNITY): Payer: Self-pay

## 2017-05-19 ENCOUNTER — Encounter (HOSPITAL_BASED_OUTPATIENT_CLINIC_OR_DEPARTMENT_OTHER): Payer: Commercial Managed Care - HMO | Admitting: Oncology

## 2017-05-19 VITALS — BP 111/64 | HR 61 | Resp 18 | Ht 70.5 in | Wt 199.5 lb

## 2017-05-19 DIAGNOSIS — R197 Diarrhea, unspecified: Secondary | ICD-10-CM | POA: Diagnosis not present

## 2017-05-19 DIAGNOSIS — D373 Neoplasm of uncertain behavior of appendix: Secondary | ICD-10-CM

## 2017-05-19 DIAGNOSIS — Z8509 Personal history of malignant neoplasm of other digestive organs: Secondary | ICD-10-CM

## 2017-05-19 LAB — CBC WITH DIFFERENTIAL/PLATELET
BASOS ABS: 0 10*3/uL (ref 0.0–0.1)
BASOS PCT: 1 %
EOS ABS: 0.2 10*3/uL (ref 0.0–0.7)
Eosinophils Relative: 3 %
HCT: 37 % (ref 36.0–46.0)
HEMOGLOBIN: 12.1 g/dL (ref 12.0–15.0)
Lymphocytes Relative: 32 %
Lymphs Abs: 2.4 10*3/uL (ref 0.7–4.0)
MCH: 29.2 pg (ref 26.0–34.0)
MCHC: 32.7 g/dL (ref 30.0–36.0)
MCV: 89.4 fL (ref 78.0–100.0)
MONOS PCT: 10 %
Monocytes Absolute: 0.7 10*3/uL (ref 0.1–1.0)
NEUTROS PCT: 54 %
Neutro Abs: 4 10*3/uL (ref 1.7–7.7)
Platelets: 255 10*3/uL (ref 150–400)
RBC: 4.14 MIL/uL (ref 3.87–5.11)
RDW: 13 % (ref 11.5–15.5)
WBC: 7.4 10*3/uL (ref 4.0–10.5)

## 2017-05-19 LAB — COMPREHENSIVE METABOLIC PANEL
ALBUMIN: 4.2 g/dL (ref 3.5–5.0)
ALK PHOS: 41 U/L (ref 38–126)
ALT: 18 U/L (ref 14–54)
ANION GAP: 7 (ref 5–15)
AST: 20 U/L (ref 15–41)
BUN: 27 mg/dL — AB (ref 6–20)
CO2: 25 mmol/L (ref 22–32)
Calcium: 9.6 mg/dL (ref 8.9–10.3)
Chloride: 108 mmol/L (ref 101–111)
Creatinine, Ser: 1.51 mg/dL — ABNORMAL HIGH (ref 0.44–1.00)
GFR calc Af Amer: 44 mL/min — ABNORMAL LOW (ref >60)
GFR calc non Af Amer: 38 mL/min — ABNORMAL LOW (ref >60)
GLUCOSE: 108 mg/dL — AB (ref 65–99)
POTASSIUM: 3.2 mmol/L — AB (ref 3.5–5.1)
SODIUM: 140 mmol/L (ref 135–145)
Total Bilirubin: 0.5 mg/dL (ref 0.3–1.2)
Total Protein: 7.4 g/dL (ref 6.5–8.1)

## 2017-05-19 NOTE — Progress Notes (Signed)
Casey [name], [address] [place] [address]  No diagnosis found.  CURRENT THERAPY: Surveillance  INTERVAL HISTORY: Casey [name] 56 y.o. female returns for followup of Low grade, mucinous adenocarcinoma of appendix, S/P right hemicolectomy by Dr. [name] on 02/02/2016.  Now S/P CRS (greater and lesser omentectomy,bilateral salpingo-oophorectomy, peritoneal pelvic stripping and resection of diaphragmatic nodules)/HIPEC on 03/29/2016 by Dr. Johney Maine.  Today patient presents for follow-up. She has went back to [facility] on 02/25/17 to see Dr. Johney Maine. She had a restaging CT chest/abdomen/pelvis on 02/25/17 which did not show any evidence of recurrent disease. She continues to have diarrhea with 6-8 bowel movements a day. She's been taking 1 tablet of FiberCon daily without any improvement in her diarrhea. She also states that she occasionally have been getting a fluttering sensation in her chest. She takes Toprol however has not seen her cardiologist in years. She is currently very active and takes care of her 24-year-old grandchild. She denies any chest pain, shortness breath, abdominal pain. She denies any weight loss or decrease in appetite. She denies any fatigue.    Review of Systems  Constitutional: Negative.  Negative for chills, fever and weight loss.  HENT: Negative.   Eyes: Negative.   Respiratory: Negative.  Negative for cough.   Cardiovascular: Negative.  Negative for chest pain.  Gastrointestinal: Positive for diarrhea. Negative for abdominal pain, blood in stool, constipation, melena, nausea and vomiting.  Skin: Negative.   Neurological: Negative.  Negative for weakness and headaches.  Endo/Heme/Allergies: Negative.   Psychiatric/Behavioral: Negative.     Past Medical History:  Diagnosis Date  . Anemia   . Anxiety   . Cancer ([place])    premelanoma per hip   . Cancer of appendix ([place])   . Depression   . DUB  (dysfunctional uterine bleeding)   . Dysrhythmia    pt states has PVCs   . GERD (gastroesophageal reflux disease)   . Graves disease   . History of blood transfusion   . History of urinary tract infection    last one approx 4 years ago   . Hyperlipidemia   . Hypertension   . 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 Migraines   . Ovarian cyst    both ovaries  . Palpitations   . Pneumonia    hx of had approx 22 years ago   . PONV (postoperative nausea and vomiting)   . Pulmonary nodule     Past Surgical History:  Procedure Laterality Date  . APPENDECTOMY    . DIAGNOSTIC LAPAROSCOPY    . DILATION AND CURETTAGE OF UTERUS    . infertility testing     . LAPAROSCOPIC PARTIAL COLECTOMY N/A 02/02/2016   Procedure: LAPAROSCOPIC PARTIAL COlectomy right colon;  Surgeon: Greer Pickerel, MD;  Location: [facility];  Service: General;  Laterality: N/A;  . OOPHORECTOMY     & fallopian tubes  . TONSILLECTOMY    . VAGINAL HYSTERECTOMY    . VESICOVAGINAL FISTULA CLOSURE W/ TAH     Dr. Margaretha Glassing  2 degree DUB     Family History  Problem Relation Age of Onset  . Hypertension Mother   . Irregular heart beat Mother   . Thyroid disease Maternal Grandmother   . Cancer Maternal Grandmother        lung  . Cancer Paternal Grandfather        lung  Social History   Social History  . Marital status: Married    Spouse name: N/A  . Number of children: N/A  . Years of education: N/A   Social History Main Topics  . Smoking status: Never Smoker  . Smokeless tobacco: Never Used  . Alcohol use No  . Drug use: No  . Sexual activity: Not Currently    Partners: Male    Birth control/ protection: Surgical     Comment: [facility]   Other Topics Concern  . None   Social History Narrative  . None     PHYSICAL EXAMINATION  ECOG PERFORMANCE STATUS: 1 - Symptomatic but completely ambulatory  Vitals:   05/19/17 1331  BP: 111/64  Pulse: 61  Resp: 18    GENERAL:alert, well  nourished, well developed, comfortable, cooperative, smiling and accompanied by husband SKIN: skin color, texture, turgor are normal, no rashes or significant lesions HEAD: Normocephalic, No masses, lesions, tenderness or abnormalities EYES: normal, EOMI, Conjunctiva are pink and non-injected EARS: External ears normal OROPHARYNX:lips, buccal mucosa, and tongue normal and mucous membranes are moist  NECK: supple, trachea midline LYMPH:  no palpable lymphadenopathy BREAST:not examined LUNGS: clear to auscultation  HEART: regular rate & rhythm ABDOMEN:abdomen soft, non-tender, obese and normal bowel sounds BACK: Back symmetric, no curvature., No CVA tenderness EXTREMITIES:less then 2 second capillary refill, no joint deformities, effusion, or inflammation, no skin discoloration, no cyanosis  NEURO: alert & oriented x 3 with fluent speech, no focal motor/sensory deficits, gait normal  LABORATORY DATA: CBC    Component Value Date/Time   WBC 7.4 05/19/2017 1300   RBC 4.14 05/19/2017 1300   HGB 12.1 05/19/2017 1300   HGB 11.6 03/29/2017 0933   HCT 37.0 05/19/2017 1300   HCT 34.6 03/29/2017 0933   PLT 255 05/19/2017 1300   PLT 258 03/29/2017 0933   MCV 89.4 05/19/2017 1300   MCV 87 03/29/2017 0933   MCH 29.2 05/19/2017 1300   MCHC 32.7 05/19/2017 1300   RDW 13.0 05/19/2017 1300   RDW 14.3 03/29/2017 0933   LYMPHSABS 2.4 05/19/2017 1300   LYMPHSABS 2.2 03/29/2017 0933   MONOABS 0.7 05/19/2017 1300   EOSABS 0.2 05/19/2017 1300   EOSABS 0.2 03/29/2017 0933   BASOSABS 0.0 05/19/2017 1300   BASOSABS 0.0 03/29/2017 0933      Chemistry      Component Value Date/Time   NA 143 03/29/2017 0933   K 3.3 (L) 03/29/2017 0933   CL 102 03/29/2017 0933   CO2 27 03/29/2017 0933   BUN 21 03/29/2017 0933   CREATININE 1.25 (H) 03/29/2017 0933   CREATININE 1.01 03/28/2013 1621      Component Value Date/Time   CALCIUM 9.3 03/29/2017 0933   ALKPHOS 50 03/29/2017 0933   AST 19 03/29/2017  0933   ALT 18 03/29/2017 0933   BILITOT 0.2 03/29/2017 0933        PENDING LABS:   RADIOGRAPHIC STUDIES:  CT CAP 02/25/17 at [facility]  CONCLUSION: 1. Postsurgical changes of prior cytoreductive surgery without evidence of recurrent disease. 2. Multiple sub-5 mm nodules are favored to be incidental. Attention on follow-up. CT chest/abd/pelvis personally reviewed.    ASSESSMENT AND PLAN:  Low grade, mucinous adenocarcinoma of appendix, S/P right hemicolectomy by Dr. [name] on 02/02/2016.  Now S/P CRS (greater and lesser omentectomy,bilateral salpingo-oophorectomy, peritoneal pelvic stripping and resection of diaphragmatic nodules)/HIPEC on 03/29/2016 by Dr. Johney Maine.  Patient's clinically NED. She had a repeat restaging CT in April which did not show  any evidence of recurrent disease. I have recommended for her to increase her fibercon to 2 capsules a day to see if this would improve her diarrhea.  Continue follow-up with her surgeon at [facility], Dr. Johney Maine. She has repeat restaging scans ordered for October, and every 6 months for 4 years.  Since she is seeing her surgeon at [facility] frequently with scans, we will see her back in 1 year for follow-up. She knows to come see [place] sooner should she have any new symptoms that is worrisome for recurrence of her disease.   Return to clinic in 1 year for follow-up with CBC, CMP.  THERAPY PLAN:  Ongoing surveillance.  All questions were answered. The patient knows to call the clinic with any problems, questions or concerns. We can certainly see the patient much sooner if necessary.  This note is electronically signed by: Twana First, MD 05/19/2017 1:33 PM

## 2017-05-24 ENCOUNTER — Telehealth: Payer: Self-pay

## 2017-05-24 DIAGNOSIS — D373 Neoplasm of uncertain behavior of appendix: Secondary | ICD-10-CM

## 2017-05-24 NOTE — Telephone Encounter (Signed)
Wants a referral for colonscopy to [facility]

## 2017-05-30 ENCOUNTER — Other Ambulatory Visit (HOSPITAL_COMMUNITY): Payer: Self-pay | Admitting: Oncology

## 2017-05-30 DIAGNOSIS — G4700 Insomnia, unspecified: Secondary | ICD-10-CM

## 2017-05-30 NOTE — Addendum Note (Signed)
Addended by: Chevis Pretty on: 05/30/2017 05:24 PM   Modules accepted: Orders

## 2017-06-15 DIAGNOSIS — C181 Malignant neoplasm of appendix: Secondary | ICD-10-CM | POA: Diagnosis not present

## 2017-07-06 DIAGNOSIS — Z980 Intestinal bypass and anastomosis status: Secondary | ICD-10-CM | POA: Diagnosis not present

## 2017-07-06 DIAGNOSIS — C7A020 Malignant carcinoid tumor of the appendix: Secondary | ICD-10-CM | POA: Diagnosis not present

## 2017-07-11 ENCOUNTER — Other Ambulatory Visit: Payer: Self-pay | Admitting: Nurse Practitioner

## 2017-07-11 MED ORDER — PROMETHAZINE HCL 25 MG PO TABS: 25.0000 mg | ORAL_TABLET | Freq: Four times a day (QID) | ORAL | 1 refills | Status: DC | PRN

## 2017-07-12 ENCOUNTER — Ambulatory Visit: Payer: Commercial Managed Care - HMO | Admitting: Certified Nurse Midwife

## 2017-07-12 ENCOUNTER — Encounter: Payer: Self-pay | Admitting: Certified Nurse Midwife

## 2017-07-12 NOTE — Progress Notes (Deleted)
56 y.o. G22P3003 Married  Caucasian Fe here for annual exam.    Patient's last menstrual period was 05/23/1995.          Sexually active: {yes no:314532}  The current method of family planning is status post hysterectomy.    Exercising: {yes no:314532}  {types:19826} Smoker:  {YES NO:22349}  Health Maintenance: Pap:  05-31-10 neg History of Abnormal Pap: {YES NO:22349} MMG:  07-08-16 category b density birads 1:neg Self Breast exams: {YES NO:22349} Colonoscopy:  none BMD:   none TDaP:  2016 Shingles: no Pneumonia: no Hep C and HIV: HIV neg yrs ago, hep c neg 2017 Labs: ***   reports that she has never smoked. She has never used smokeless tobacco. She reports that she does not drink alcohol or use drugs.  Past Medical History:  Diagnosis Date  . Anemia   . Anxiety   . Cancer ([place])    premelanoma per hip   . Cancer of appendix ([place])   . Depression   . DUB (dysfunctional uterine bleeding)   . Dysrhythmia    pt states has PVCs   . GERD (gastroesophageal reflux disease)   . Graves disease   . History of blood transfusion   . History of urinary tract infection    last one approx 4 years ago   . Hyperlipidemia   . Hypertension   . 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 Migraines   . Ovarian cyst    both ovaries  . Palpitations   . Pneumonia    hx of had approx 22 years ago   . PONV (postoperative nausea and vomiting)   . Pulmonary nodule     Past Surgical History:  Procedure Laterality Date  . APPENDECTOMY    . DIAGNOSTIC LAPAROSCOPY    . DILATION AND CURETTAGE OF UTERUS    . infertility testing     . LAPAROSCOPIC PARTIAL COLECTOMY N/A 02/02/2016   Procedure: LAPAROSCOPIC PARTIAL COlectomy right colon;  Surgeon: Greer Pickerel, MD;  Location: [facility];  Service: General;  Laterality: N/A;  . OOPHORECTOMY     & fallopian tubes  . TONSILLECTOMY    . VAGINAL HYSTERECTOMY    . VESICOVAGINAL FISTULA CLOSURE W/ TAH     Dr. Margaretha Glassing  2 degree DUB      Current Outpatient Prescriptions  Medication Sig Dispense Refill  . acetaminophen (TYLENOL) 500 MG tablet Take 500 mg by mouth 3 times/day as needed-between meals & bedtime.    . cetirizine (ZYRTEC) 10 MG tablet TAKE ONE (1) TABLET EACH DAY 30 tablet 10  . escitalopram (LEXAPRO) 10 MG tablet TAKE ONE (1) TABLET EACH DAY 90 tablet 0  . fenofibrate 160 MG tablet TAKE ONE (1) TABLET EACH DAY 30 tablet 4  . folic acid (FOLVITE) 1 MG tablet TAKE ONE (1) TABLET EACH DAY 30 tablet 5  . Ibuprofen 200 MG CAPS Take 400 mg by mouth 3 times/day as needed-between meals & bedtime.     Marland Kitchen levothyroxine (SYNTHROID) 100 MCG tablet Take 1 tablet (100 mcg total) by mouth daily before breakfast. 30 tablet 5  . metoprolol succinate (TOPROL-XL) 50 MG 24 hr tablet TAKE ONE (1) TABLET EACH DAY 30 tablet 3  . pantoprazole (PROTONIX) 40 MG tablet Take 1 tablet (40 mg total) by mouth as needed. (Patient taking differently: Take 40 mg by mouth daily as needed (for GERD). ) 30 tablet 5  . polycarbophil (FIBERCON) 625 MG tablet Take 1 tablet (  625 mg total) by mouth daily. 30 tablet 0  . potassium chloride SA (K-DUR,KLOR-CON) 20 MEQ tablet Take 1 tablet (20 mEq total) by mouth 3 (three) times daily. 90 tablet 5  . promethazine (PHENERGAN) 25 MG tablet Take 1 tablet (25 mg total) by mouth every 6 (six) hours as needed for nausea or vomiting. 30 tablet 1  . valsartan-hydrochlorothiazide (DIOVAN-HCT) 160-12.5 MG tablet TAKE ONE (1) TABLET EACH DAY 30 tablet 3  . zolpidem (AMBIEN CR) 12.5 MG CR tablet TAKE 1 TABLET AT BEDTIME AS NEEDED FOR SLEEP 30 tablet 5   No current facility-administered medications for this visit.     Family History  Problem Relation Age of Onset  . Hypertension Mother   . Irregular heart beat Mother   . Thyroid disease Maternal Grandmother   . Cancer Maternal Grandmother        lung  . Cancer Paternal Grandfather        lung    ROS:  Pertinent items are noted in HPI.  Otherwise, a  comprehensive ROS was negative.  Exam:   LMP 05/23/1995    Ht Readings from Last 3 Encounters:  05/19/17 5' 10.5" (1.791 m)  03/29/17 5\' 9"  (1.753 m)  01/14/17 5\' 9"  (1.753 m)    General appearance: alert, cooperative and appears stated age Head: Normocephalic, without obvious abnormality, atraumatic Neck: no adenopathy, supple, symmetrical, trachea midline and thyroid {EXAM; THYROID:18604} Lungs: clear to auscultation bilaterally Breasts: {Exam; breast:13139::"normal appearance, no masses or tenderness"} Heart: regular rate and rhythm Abdomen: soft, non-tender; no masses,  no organomegaly Extremities: extremities normal, atraumatic, no cyanosis or edema Skin: Skin color, texture, turgor normal. No rashes or lesions Lymph nodes: Cervical, supraclavicular, and axillary nodes normal. No abnormal inguinal nodes palpated Neurologic: Grossly normal   Pelvic: External genitalia:  no lesions              Urethra:  normal appearing urethra with no masses, tenderness or lesions              Bartholin's and Skene's: normal                 Vagina: normal appearing vagina with normal color and discharge, no lesions              Cervix: {exam; cervix:14595}              Pap taken: {yes no:314532} Bimanual Exam:  Uterus:  {exam; uterus:12215}              Adnexa: {exam; adnexa:12223}               Rectovaginal: Confirms               Anus:  normal sphincter tone, no lesions  Chaperone present: ***  A:  Well Woman with normal exam  P:   Reviewed health and wellness pertinent to exam  Pap smear: {YES NO:22349}  {plan; gyn:5269::"mammogram","pap smear","return annually or prn"}  An After Visit Summary was printed and given to the patient.

## 2017-08-18 ENCOUNTER — Other Ambulatory Visit: Payer: Self-pay | Admitting: Nurse Practitioner

## 2017-09-02 DIAGNOSIS — Z9049 Acquired absence of other specified parts of digestive tract: Secondary | ICD-10-CM | POA: Diagnosis not present

## 2017-09-02 DIAGNOSIS — C181 Malignant neoplasm of appendix: Secondary | ICD-10-CM | POA: Diagnosis not present

## 2017-09-02 DIAGNOSIS — Z08 Encounter for follow-up examination after completed treatment for malignant neoplasm: Secondary | ICD-10-CM | POA: Diagnosis not present

## 2017-09-02 DIAGNOSIS — Z85038 Personal history of other malignant neoplasm of large intestine: Secondary | ICD-10-CM | POA: Diagnosis not present

## 2017-09-02 DIAGNOSIS — K8020 Calculus of gallbladder without cholecystitis without obstruction: Secondary | ICD-10-CM | POA: Diagnosis not present

## 2017-09-26 ENCOUNTER — Other Ambulatory Visit: Payer: Self-pay | Admitting: Nurse Practitioner

## 2017-09-27 ENCOUNTER — Ambulatory Visit (HOSPITAL_COMMUNITY)
Admission: RE | Admit: 2017-09-27 | Discharge: 2017-09-27 | Disposition: A | Discharge status: Home / Self Care | Payer: 59 | Source: Ambulatory Visit | Attending: Nurse Practitioner | Admitting: Nurse Practitioner

## 2017-09-27 ENCOUNTER — Ambulatory Visit (INDEPENDENT_AMBULATORY_CARE_PROVIDER_SITE_OTHER): Payer: 59

## 2017-09-27 ENCOUNTER — Ambulatory Visit (INDEPENDENT_AMBULATORY_CARE_PROVIDER_SITE_OTHER): Payer: 59 | Admitting: Nurse Practitioner

## 2017-09-27 ENCOUNTER — Encounter: Payer: Self-pay | Admitting: Nurse Practitioner

## 2017-09-27 VITALS — BP 125/75 | HR 66 | Temp 97.7°F | Ht 70.0 in | Wt 211.0 lb

## 2017-09-27 DIAGNOSIS — I10 Essential (primary) hypertension: Secondary | ICD-10-CM

## 2017-09-27 DIAGNOSIS — E7800 Pure hypercholesterolemia, unspecified: Secondary | ICD-10-CM

## 2017-09-27 DIAGNOSIS — E039 Hypothyroidism, unspecified: Secondary | ICD-10-CM | POA: Diagnosis not present

## 2017-09-27 DIAGNOSIS — K760 Fatty (change of) liver, not elsewhere classified: Secondary | ICD-10-CM | POA: Insufficient documentation

## 2017-09-27 DIAGNOSIS — K8020 Calculus of gallbladder without cholecystitis without obstruction: Secondary | ICD-10-CM | POA: Diagnosis not present

## 2017-09-27 DIAGNOSIS — R1011 Right upper quadrant pain: Secondary | ICD-10-CM

## 2017-09-27 DIAGNOSIS — Z23 Encounter for immunization: Secondary | ICD-10-CM | POA: Diagnosis not present

## 2017-09-27 IMAGING — DX DG ABDOMEN 1V
2 series · 2 of 2 positions shown · non-contrast
Comparison: CT abdomen pelvis [DATE]

CLINICAL DATA: Right upper quadrant pain.  History of gallstones

EXAM:
ABDOMEN - 1 VIEW

[abdomen kub (1 of 2)]
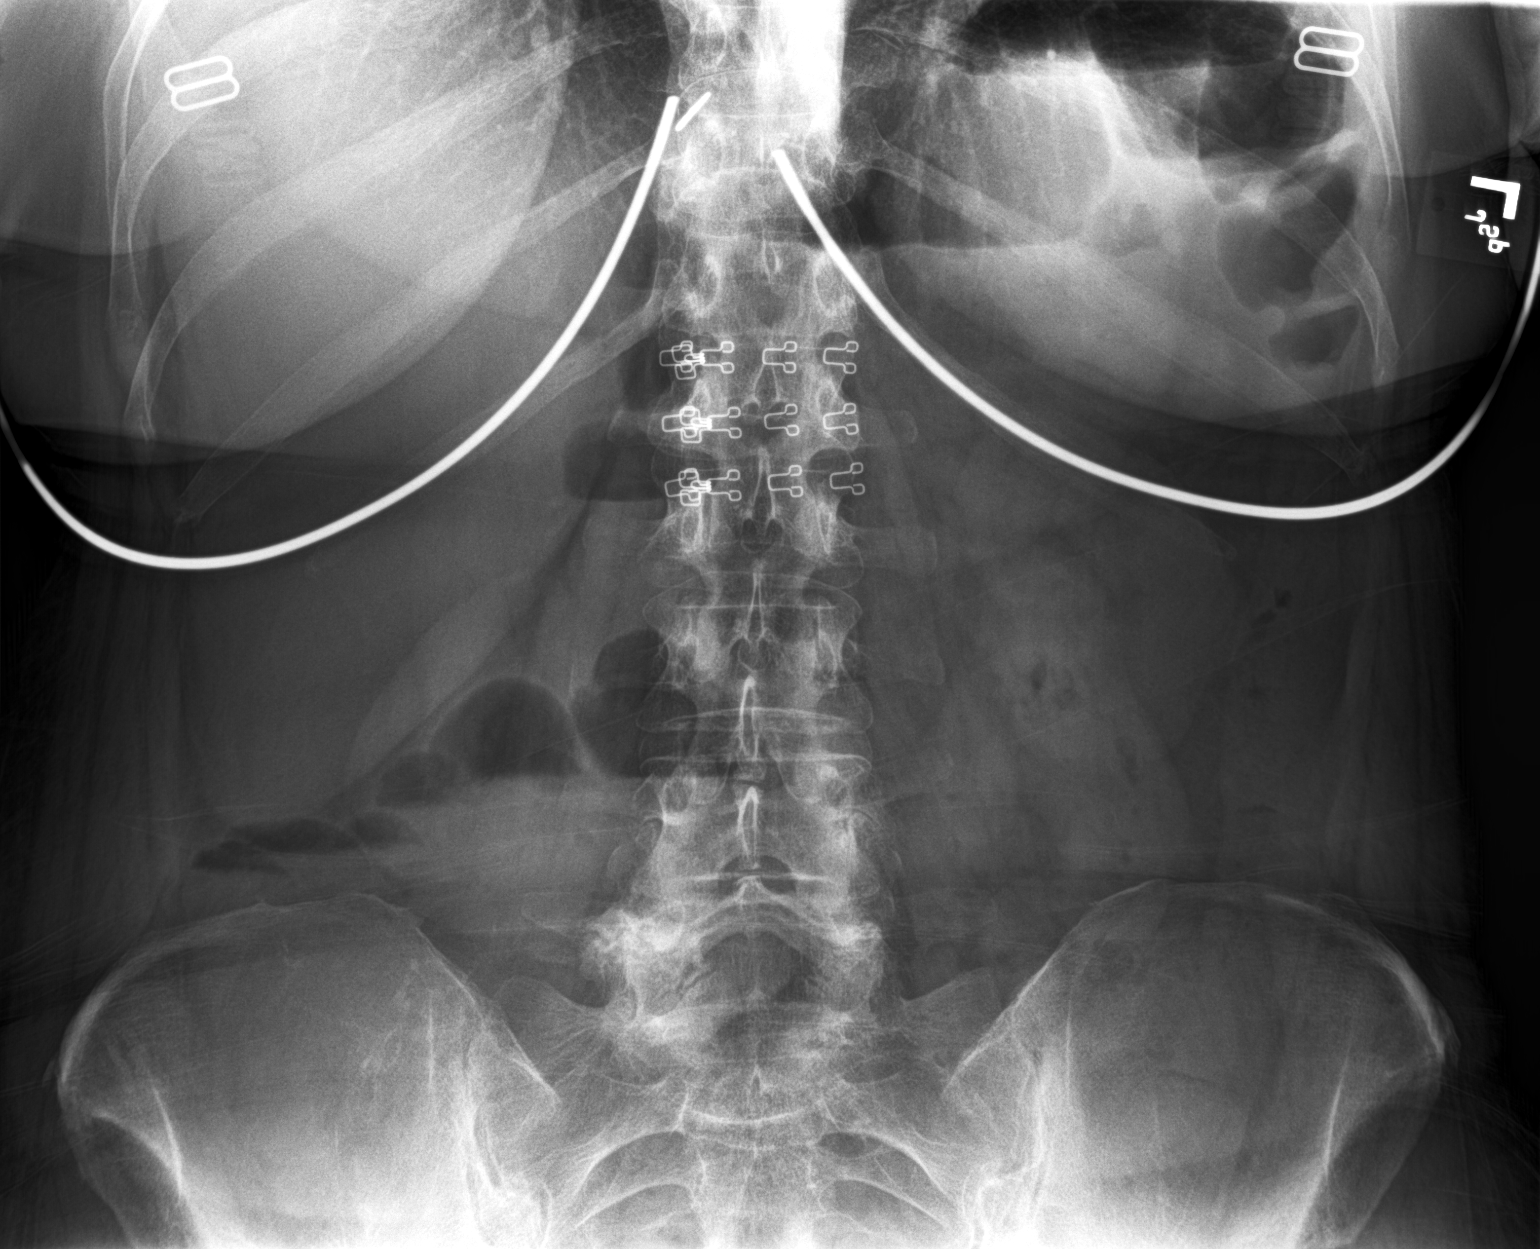

[abdomen kub (2 of 2)]
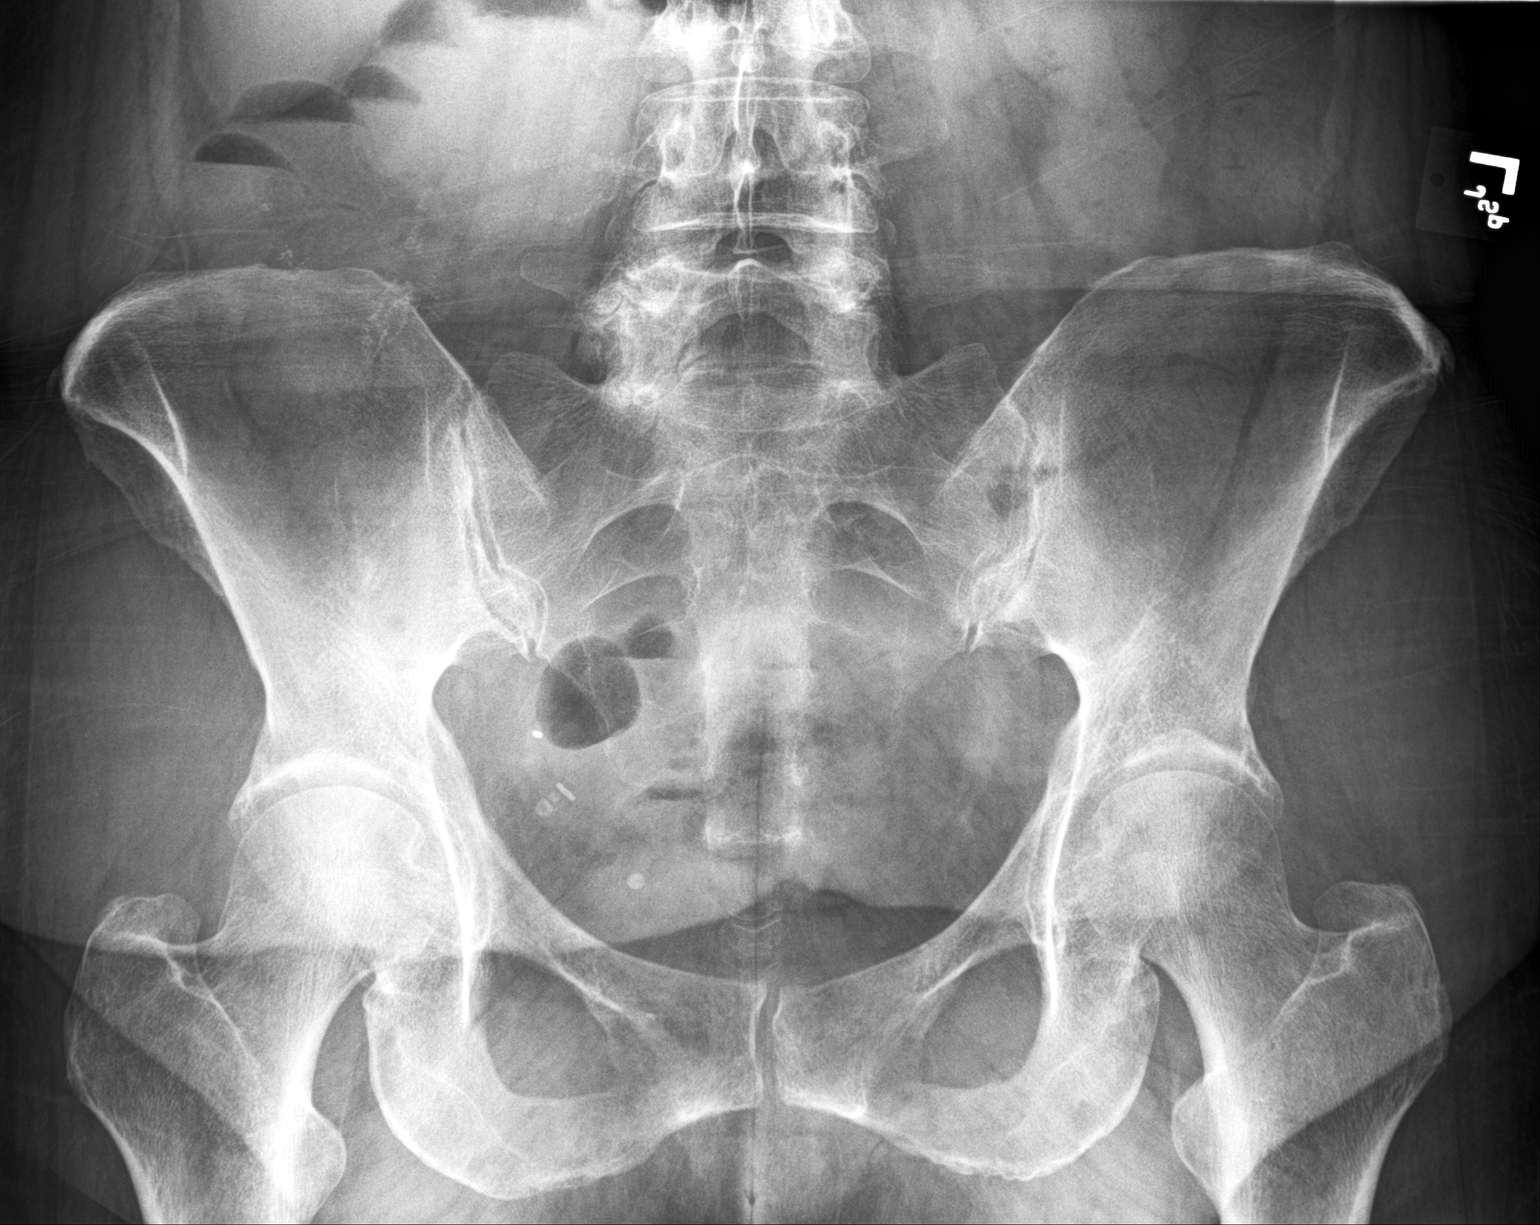

[2 of 2 positions shown; findings below may reference images not displayed]

FINDINGS: No dilated bowel loops. Air-fluid levels in the right colon without
dilatation or thickening. Small bowel nondilated. Surgical bowel
clips in the right mid abdomen region of the right colon.

No renal calculi. No acute skeletal abnormality. Facet degeneration
at L4-5 and L5-S1 on the right
IMPRESSION: Nonobstructive bowel gas pattern. Liquid stool in the right colon.
Surgical resection right colon.

## 2017-09-27 NOTE — Progress Notes (Signed)
   Subjective:    Patient ID: Casey [name], female    DOB: Apr 23, 1961, 56 y.o.   MRN: [id number]  HPI Patient comes in today c/o right sided abdominal pain. She is a year and a half out from appendiceal cancer. Had surgical intervention as well as they instilled chemo in abdomen. She has been doing well other then fatigue. Pain started 2 weeks ago. Mainly in right flank area. CT of abdomen was done on 09/02/17 and was all clear with no reoccurrence of cancer. howevr it did show cholelithiasis but no cholecystitis. She rates pain today 2/10 but last night was 9/10. Nothing really seems to make pain worse. Tylenol helps some.  * needs thyroid checked today * she has not been taking her potassium because she got choked on one o fpills.  Review of Systems  Constitutional: Negative.   HENT: Negative.   Respiratory: Negative.   Cardiovascular: Negative.   Gastrointestinal: Positive for abdominal pain and diarrhea (no more then usual). Negative for constipation and nausea.  Genitourinary: Positive for frequency and urgency.  Neurological: Negative.   Psychiatric/Behavioral: Negative.   All other systems reviewed and are negative.      Objective:   Physical Exam  Constitutional: She is oriented to person, place, and time. She appears well-developed and well-nourished. No distress.  Neck: Normal range of motion.  Cardiovascular: Normal rate and regular rhythm.  Pulmonary/Chest: Effort normal and breath sounds normal.  Abdominal: Soft. Bowel sounds are normal. There is tenderness (more right upper quadrant. + murphy sign.).  Neurological: She is alert and oriented to person, place, and time.  Skin: Skin is warm.  Psychiatric: She has a normal mood and affect. Her behavior is normal. Judgment and thought content normal.    BP 125/75   Pulse 66   Temp 97.7 F (36.5 C) (Oral)   Ht 5\' 10"  (1.778 m)   Wt 211 lb (95.7 kg)   LMP 05/23/1995   BMI 30.28 kg/m   KUB- no acute findings.       Assessment & Plan:   1. Right upper quadrant abdominal pain    Could be gallbladder issues- knowing that she does have gallstones seen on ct scan Avoid spicy and fatty foods We will talk once test results are in.  [name] Hassell Done, FNP

## 2017-09-27 NOTE — Patient Instructions (Signed)
Cholelithiasis Cholelithiasis is also called "gallstones." It is a kind of gallbladder disease. The gallbladder is an organ that stores a liquid (bile) that helps you digest fat. Gallstones may not cause symptoms (may be silent gallstones) until they cause a blockage, and then they can cause pain (gallbladder attack). Follow these instructions at home:  Take over-the-counter and prescription medicines only as told by your doctor.  Stay at a healthy weight.  Eat healthy foods. This includes: ? Eating fewer fatty foods, like fried foods. ? Eating fewer refined carbs (refined carbohydrates). Refined carbs are breads and grains that are highly processed, like white bread and white rice. Instead, choose whole grains like whole-wheat bread and brown rice. ? Eating more fiber. Almonds, fresh fruit, and beans are healthy sources of fiber.  Keep all follow-up visits as told by your doctor. This is important. Contact a doctor if:  You have sudden pain in the upper right side of your belly (abdomen). Pain might spread to your right shoulder or your chest. This may be a sign of a gallbladder attack.  You feel sick to your stomach (are nauseous).  You throw up (vomit).  You have been diagnosed with gallstones that have no symptoms and you get: ? Belly pain. ? Discomfort, burning, or fullness in the upper part of your belly (indigestion). Get help right away if:  You have sudden pain in the upper right side of your belly, and it lasts for more than 2 hours.  You have belly pain that lasts for more than 5 hours.  You have a fever or chills.  You keep feeling sick to your stomach or you keep throwing up.  Your skin or the whites of your eyes turn yellow (jaundice).  You have dark-colored pee (urine).  You have light-colored poop (stool). Summary  Cholelithiasis is also called "gallstones."  The gallbladder is an organ that stores a liquid (bile) that helps you digest fat.  Silent  gallstones are gallstones that do not cause symptoms.  A gallbladder attack may cause sudden pain in the upper right side of your belly. Pain might spread to your right shoulder or your chest. If this happens, contact your doctor.  If you have sudden pain in the upper right side of your belly that lasts for more than 2 hours, get help right away. This information is not intended to replace advice given to you by your health care provider. Make sure you discuss any questions you have with your health care provider. Document Released: 04/26/2008 Document Revised: 07/25/2016 Document Reviewed: 07/25/2016 Elsevier Interactive Patient Education  2017 Elsevier Inc.  

## 2017-09-28 LAB — CMP14+EGFR
ALBUMIN: 4.2 g/dL (ref 3.5–5.5)
ALT: 14 IU/L (ref 0–32)
AST: 19 IU/L (ref 0–40)
Albumin/Globulin Ratio: 1.9 (ref 1.2–2.2)
Alkaline Phosphatase: 47 IU/L (ref 39–117)
BUN/Creatinine Ratio: 19 (ref 9–23)
BUN: 22 mg/dL (ref 6–24)
Bilirubin Total: 0.2 mg/dL (ref 0.0–1.2)
CALCIUM: 9.2 mg/dL (ref 8.7–10.2)
CO2: 23 mmol/L (ref 20–29)
CREATININE: 1.13 mg/dL — AB (ref 0.57–1.00)
Chloride: 107 mmol/L — ABNORMAL HIGH (ref 96–106)
GFR calc Af Amer: 63 mL/min/{1.73_m2} (ref >59)
GFR, EST NON AFRICAN AMERICAN: 54 mL/min/{1.73_m2} — AB (ref >59)
GLOBULIN, TOTAL: 2.2 g/dL (ref 1.5–4.5)
Glucose: 93 mg/dL (ref 65–99)
Potassium: 3.1 mmol/L — ABNORMAL LOW (ref 3.5–5.2)
SODIUM: 146 mmol/L — AB (ref 134–144)
Total Protein: 6.4 g/dL (ref 6.0–8.5)

## 2017-09-28 LAB — THYROID PANEL WITH TSH
FREE THYROXINE INDEX: 2.1 (ref 1.2–4.9)
T3 Uptake Ratio: 25 % (ref 24–39)
T4 TOTAL: 8.2 ug/dL (ref 4.5–12.0)
TSH: 4.57 u[IU]/mL — ABNORMAL HIGH (ref 0.450–4.500)

## 2017-09-28 LAB — LIPID PANEL
CHOLESTEROL TOTAL: 130 mg/dL (ref 100–199)
Chol/HDL Ratio: 2.2 ratio (ref 0.0–4.4)
HDL: 59 mg/dL (ref >39)
LDL CALC: 47 mg/dL (ref 0–99)
TRIGLYCERIDES: 118 mg/dL (ref 0–149)
VLDL Cholesterol Cal: 24 mg/dL (ref 5–40)

## 2017-10-26 ENCOUNTER — Other Ambulatory Visit: Payer: Self-pay | Admitting: Nurse Practitioner

## 2017-11-23 ENCOUNTER — Other Ambulatory Visit (HOSPITAL_COMMUNITY): Payer: Self-pay | Admitting: Oncology

## 2017-11-23 DIAGNOSIS — G4700 Insomnia, unspecified: Secondary | ICD-10-CM

## 2017-11-25 ENCOUNTER — Telehealth (HOSPITAL_COMMUNITY): Payer: Self-pay | Admitting: *Deleted

## 2017-11-25 ENCOUNTER — Other Ambulatory Visit (HOSPITAL_COMMUNITY): Payer: Self-pay | Admitting: *Deleted

## 2017-11-25 DIAGNOSIS — G4700 Insomnia, unspecified: Secondary | ICD-10-CM

## 2017-11-25 MED ORDER — ZOLPIDEM TARTRATE ER 12.5 MG PO TBCR: 12.5000 mg | EXTENDED_RELEASE_TABLET | Freq: Every evening | ORAL | 1 refills | Status: DC | PRN

## 2017-11-25 NOTE — Telephone Encounter (Signed)
Sent Rx to pt's pharmacy.

## 2017-12-01 ENCOUNTER — Other Ambulatory Visit: Payer: Self-pay | Admitting: Nurse Practitioner

## 2017-12-25 ENCOUNTER — Other Ambulatory Visit (HOSPITAL_COMMUNITY): Payer: Self-pay | Admitting: Oncology

## 2017-12-25 DIAGNOSIS — G4700 Insomnia, unspecified: Secondary | ICD-10-CM

## 2017-12-27 ENCOUNTER — Encounter (HOSPITAL_COMMUNITY): Payer: Self-pay | Admitting: Adult Health

## 2017-12-27 NOTE — Progress Notes (Signed)
Received refill request from pharmacy for Ambien.   [place] Controlled Substance Reporting System reviewed and refill is appropriate on or after 12/27/17. Medication e-scribed to her pharmacy ([facility]) using [employer]'s 2-step verification process.    [place] reviewed:     Mike Craze, NP [place] [phone]

## 2018-02-26 ENCOUNTER — Telehealth: Payer: 59 | Admitting: Physician Assistant

## 2018-02-26 DIAGNOSIS — J019 Acute sinusitis, unspecified: Secondary | ICD-10-CM

## 2018-02-26 DIAGNOSIS — B9689 Other specified bacterial agents as the cause of diseases classified elsewhere: Secondary | ICD-10-CM

## 2018-02-26 MED ORDER — DOXYCYCLINE HYCLATE 100 MG PO CAPS: 100.0000 mg | ORAL_CAPSULE | Freq: Two times a day (BID) | ORAL | 0 refills | Status: DC

## 2018-02-26 NOTE — Progress Notes (Signed)

## 2018-03-07 ENCOUNTER — Other Ambulatory Visit: Payer: Self-pay | Admitting: Nurse Practitioner

## 2018-03-07 ENCOUNTER — Other Ambulatory Visit (HOSPITAL_COMMUNITY): Payer: Self-pay | Admitting: Adult Health

## 2018-03-07 DIAGNOSIS — G4700 Insomnia, unspecified: Secondary | ICD-10-CM

## 2018-03-08 ENCOUNTER — Encounter (HOSPITAL_COMMUNITY): Payer: Self-pay | Admitting: Adult Health

## 2018-03-08 NOTE — Progress Notes (Signed)
Received refill request from pharmacy for Ambien.  [place] Controlled Substance Reporting System reviewed and refill is appropriate on or after 03/08/18. Medication e-scribed to her pharmacy ([facility]) using [employer]'s 2-step verification process.    [place]CCSRS reviewed:     Mike Craze, NP [place] [phone]

## 2018-03-10 ENCOUNTER — Other Ambulatory Visit: Payer: Self-pay | Admitting: Nurse Practitioner

## 2018-03-13 NOTE — Telephone Encounter (Signed)
Last seen 09/27/17  MMM 

## 2018-04-17 ENCOUNTER — Other Ambulatory Visit: Payer: Self-pay | Admitting: Nurse Practitioner

## 2018-04-18 NOTE — Telephone Encounter (Signed)
Last seen 12/07/16

## 2018-05-16 ENCOUNTER — Encounter: Payer: Self-pay | Admitting: Nurse Practitioner

## 2018-05-16 ENCOUNTER — Ambulatory Visit: Payer: 59 | Admitting: Nurse Practitioner

## 2018-05-16 VITALS — BP 119/77 | HR 56 | Temp 97.1°F | Ht 70.0 in | Wt 209.0 lb

## 2018-05-16 DIAGNOSIS — I10 Essential (primary) hypertension: Secondary | ICD-10-CM

## 2018-05-16 DIAGNOSIS — E7800 Pure hypercholesterolemia, unspecified: Secondary | ICD-10-CM

## 2018-05-16 DIAGNOSIS — E039 Hypothyroidism, unspecified: Secondary | ICD-10-CM

## 2018-05-16 DIAGNOSIS — F5101 Primary insomnia: Secondary | ICD-10-CM | POA: Diagnosis not present

## 2018-05-16 DIAGNOSIS — D373 Neoplasm of uncertain behavior of appendix: Secondary | ICD-10-CM

## 2018-05-16 DIAGNOSIS — F3342 Major depressive disorder, recurrent, in full remission: Secondary | ICD-10-CM | POA: Diagnosis not present

## 2018-05-16 DIAGNOSIS — K219 Gastro-esophageal reflux disease without esophagitis: Secondary | ICD-10-CM

## 2018-05-16 MED ORDER — ESCITALOPRAM OXALATE 10 MG PO TABS: ORAL_TABLET | ORAL | 1 refills | Status: DC

## 2018-05-16 MED ORDER — METOPROLOL SUCCINATE ER 50 MG PO TB24: ORAL_TABLET | ORAL | 5 refills | Status: DC

## 2018-05-16 MED ORDER — PANTOPRAZOLE SODIUM 40 MG PO TBEC: 40.0000 mg | DELAYED_RELEASE_TABLET | ORAL | 5 refills | Status: DC | PRN

## 2018-05-16 MED ORDER — ZOLPIDEM TARTRATE 10 MG PO TABS: 10.0000 mg | ORAL_TABLET | Freq: Every evening | ORAL | 2 refills | Status: DC | PRN

## 2018-05-16 MED ORDER — FENOFIBRATE 160 MG PO TABS: ORAL_TABLET | ORAL | 5 refills | Status: DC

## 2018-05-16 MED ORDER — LEVOTHYROXINE SODIUM 100 MCG PO TABS: ORAL_TABLET | ORAL | 5 refills | Status: DC

## 2018-05-16 MED ORDER — VALSARTAN-HYDROCHLOROTHIAZIDE 160-12.5 MG PO TABS: ORAL_TABLET | ORAL | 5 refills | Status: DC

## 2018-05-16 NOTE — Addendum Note (Signed)
Addended by: Rolena Infante on: 05/16/2018 03:11 PM   Modules accepted: Orders

## 2018-05-16 NOTE — Progress Notes (Signed)
Subjective:    Patient ID: Casey [name], female    DOB: [birth year], 57 y.o.   MRN: [id number]   Chief Complaint: medical management of chronic issures  HPI:  1. Essential hypertension  No c/o chest pain, sob or headache. She does not check blood pressure at home. 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 Her last report was clear.  3. Acquired hypothyroidism  Not having any problems that she is aware of.  4. Pure hypercholesterolemia  appetite Is still not back to normal from appendectomy.   5. Recurrent major depressive disorder, in full remission [facility])  is currently on lexapro which she says is working well. Depression screen [facility] 2/9 05/16/2018 09/27/2017 03/29/2017  Decreased Interest 0 0 0  Down, Depressed, Hopeless 0 0 0  PHQ - 2 Score 0 0 0     6. Gastroesophageal reflux disease without esophagitis  Has not had any symptoms as of late. Currently not on anything daily.  7.      insomnia          Is on ambien CR which she says takes to long to put her to sleep. She          would rather go back on 10mg  tablets.   Outpatient Encounter Medications as of 05/16/2018  Medication Sig  . acetaminophen (TYLENOL) 500 MG tablet Take 500 mg by mouth 3 times/day as needed-between meals & bedtime.  . cetirizine (ZYRTEC) 10 MG tablet TAKE ONE (1) TABLET EACH DAY  . doxycycline (VIBRAMYCIN) 100 MG capsule Take 1 capsule (100 mg total) by mouth 2 (two) times daily.  Marland Kitchen escitalopram (LEXAPRO) 10 MG tablet TAKE ONE (1) TABLET EACH DAY  . fenofibrate 160 MG tablet TAKE ONE (1) TABLET EACH DAY  . folic acid (FOLVITE) 1 MG tablet TAKE ONE (1) TABLET EACH DAY  . Ibuprofen 200 MG CAPS Take 400 mg by mouth 3 times/day as needed-between meals & bedtime.   Marland Kitchen levothyroxine (SYNTHROID, LEVOTHROID) 100 MCG tablet TAKE ONE TABLET EACH MORNING BEFORE  BREAKFAST  . metoprolol succinate (TOPROL-XL) 50 MG 24 hr tablet TAKE ONE (1) TABLET EACH DAY  . pantoprazole (PROTONIX) 40 MG tablet Take 1 tablet (40 mg total) by mouth as needed. (Patient taking differently: Take 40 mg by mouth daily as needed (for GERD). )  . polycarbophil (FIBERCON) 625 MG tablet Take 1 tablet (625 mg total) by mouth daily. (Patient not taking: Reported on 09/27/2017)  . potassium chloride SA (K-DUR,KLOR-CON) 20 MEQ tablet Take 1 tablet (20 mEq total) by mouth 3 (three) times daily. (Patient not taking: Reported on 09/27/2017)  . promethazine (PHENERGAN) 25 MG tablet TAKE 1 TABLET EVERY 6 HOURS AS NEEDED FOR NAUSEA AND VOMITING  . valsartan-hydrochlorothiazide (DIOVAN-HCT) 160-12.5 MG tablet TAKE ONE (1) TABLET EACH DAY  . zolpidem (AMBIEN CR) 12.5 MG CR tablet TAKE 1 TABLET AT BEDTIME AS NEEDED FOR SLEEP       New complaints: None today  Social history: Lives with her husband. Daughter is getting married in October so sheis really busy. Has 3 grandchildren that she spends a lot of time with.   Review of Systems  Constitutional: Negative for activity change and appetite change.  HENT: Negative.   Eyes: Negative for pain.  Respiratory: Negative for shortness of breath.   Cardiovascular: Negative for  chest pain, palpitations and leg swelling.  Gastrointestinal: Negative for abdominal pain.  Endocrine: Negative for polydipsia.  Genitourinary: Negative.   Skin: Negative for rash.  Neurological: Negative for dizziness, weakness and headaches.  Hematological: Does not bruise/bleed easily.  Psychiatric/Behavioral: Negative.   All other systems reviewed and are negative.      Objective:   Physical Exam  Constitutional: She is oriented to person, place, and time. She appears well-developed and well-nourished.  HENT:  Head: Normocephalic.  Nose: Nose normal.  Mouth/Throat: Oropharynx is clear and moist.  Eyes: Pupils are equal, round, and reactive to light. EOM  are normal.  Neck: Normal range of motion. Neck supple. No JVD present. Carotid bruit is not present.  Cardiovascular: Normal rate, regular rhythm, normal heart sounds and intact distal pulses.  Pulmonary/Chest: Effort normal and breath sounds normal. No respiratory distress. She has no wheezes. She has no rales. She exhibits no tenderness.  Abdominal: Soft. Normal appearance, normal aorta and bowel sounds are normal. She exhibits no distension, no abdominal bruit, no pulsatile midline mass and no mass. There is no splenomegaly or hepatomegaly. There is no tenderness.  Musculoskeletal: Normal range of motion. She exhibits no edema.  Lymphadenopathy:    She has no cervical adenopathy.  Neurological: She is alert and oriented to person, place, and time. She has normal reflexes.  Skin: Skin is warm and dry.  Psychiatric: She has a normal mood and affect. Her behavior is normal. Judgment and thought content normal.  Nursing note and vitals reviewed.  BP 119/77   Pulse (!) 56   Temp (!) 97.1 F (36.2 C) (Oral)   Ht 5\' 10"  (1.778 m)   Wt 209 lb (94.8 kg)   LMP 05/23/1995   BMI 29.99 kg/m       Assessment & Plan:  Casey [name] comes in today with chief complaint of Medical Management of Chronic Issues   Diagnosis and orders addressed:  1. 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 Acquired hypothyroidism - levothyroxine (SYNTHROID, LEVOTHROID) 100 MCG tablet; TAKE ONE TABLET EACH MORNING BEFORE BREAKFAST  Dispense: 30 tablet; Refill: 5  4. Pure hypercholesterolemia Low fat diet - fenofibrate 160 MG tablet; TAKE ONE (1) TABLET EACH DAY  Dispense: 30 tablet; Refill: 5  5.  Recurrent major depressive disorder, in full remission ([place]) Stress management - escitalopram (LEXAPRO) 10 MG tablet; TAKE ONE (1) TABLET EACH DAY  Dispense: 90 tablet; Refill: 1  6. Gastroesophageal reflux disease without esophagitis Avoid spicy foods Do not eat 2 hours prior to bedtime  7. Primary insomnia Bedtime routine Changed from ambien CR to plain ambien - zolpidem (AMBIEN) 10 MG tablet; Take 1 tablet (10 mg total) by mouth at bedtime as needed for sleep.  Dispense: 30 tablet; Refill: 2   Labs pending Health Maintenance reviewed Diet and exercise encouraged  Follow up plan: 6 months   [name] Hassell Done, FNP

## 2018-05-16 NOTE — Patient Instructions (Signed)
Stress and Stress Management Stress is a normal reaction to life events. It is what you feel when life demands more than you are used to or more than you can handle. Some stress can be useful. For example, the stress reaction can help you catch the last bus of the day, study for a test, or meet a deadline at work. But stress that occurs too often or for too long can cause problems. It can affect your emotional health and interfere with relationships and normal daily activities. Too much stress can weaken your immune system and increase your risk for physical illness. If you already have a medical problem, stress can make it worse. What are the causes? All sorts of life events may cause stress. An event that causes stress for one person may not be stressful for another person. Major life events commonly cause stress. These may be positive or negative. Examples include losing your job, moving into a new home, getting married, having a baby, or losing a loved one. Less obvious life events may also cause stress, especially if they occur day after day or in combination. Examples include working long hours, driving in traffic, caring for children, being in debt, or being in a difficult relationship. What are the signs or symptoms? Stress may cause emotional symptoms including, the following:  Anxiety. This is feeling worried, afraid, on edge, overwhelmed, or out of control.  Anger. This is feeling irritated or impatient.  Depression. This is feeling sad, down, helpless, or guilty.  Difficulty focusing, remembering, or making decisions.  Stress may cause physical symptoms, including the following:  Aches and pains. These may affect your head, neck, back, stomach, or other areas of your body.  Tight muscles or clenched jaw.  Low energy or trouble sleeping.  Stress may cause unhealthy behaviors, including the following:  Eating to feel better (overeating) or skipping meals.  Sleeping too little,  too much, or both.  Working too much or putting off tasks (procrastination).  Smoking, drinking alcohol, or using drugs to feel better.  How is this diagnosed? Stress is diagnosed through an assessment by your health care provider. Your health care provider will ask questions about your symptoms and any stressful life events.Your health care provider will also ask about your medical history and may order blood tests or other tests. Certain medical conditions and medicine can cause physical symptoms similar to stress. Mental illness can cause emotional symptoms and unhealthy behaviors similar to stress. Your health care provider may refer you to a mental health professional for further evaluation. How is this treated? Stress management is the recommended treatment for stress.The goals of stress management are reducing stressful life events and coping with stress in healthy ways. Techniques for reducing stressful life events include the following:  Stress identification. Self-monitor for stress and identify what causes stress for you. These skills may help you to avoid some stressful events.  Time management. Set your priorities, keep a calendar of events, and learn to say "no." These tools can help you avoid making too many commitments.  Techniques for coping with stress include the following:  Rethinking the problem. Try to think realistically about stressful events rather than ignoring them or overreacting. Try to find the positives in a stressful situation rather than focusing on the negatives.  Exercise. Physical exercise can release both physical and emotional tension. The key is to find a form of exercise you enjoy and do it regularly.  Relaxation techniques. These relax the body and  mind. Examples include yoga, meditation, tai chi, biofeedback, deep breathing, progressive muscle relaxation, listening to music, being out in nature, journaling, and other hobbies. Again, the key is to find  one or more that you enjoy and can do regularly.  Healthy lifestyle. Eat a balanced diet, get plenty of sleep, and do not smoke. Avoid using alcohol or drugs to relax.  Strong support network. Spend time with family, friends, or other people you enjoy being around.Express your feelings and talk things over with someone you trust.  Counseling or talktherapy with a mental health professional may be helpful if you are having difficulty managing stress on your own. Medicine is typically not recommended for the treatment of stress.Talk to your health care provider if you think you need medicine for symptoms of stress. Follow these instructions at home:  Keep all follow-up visits as directed by your health care provider.  Take all medicines as directed by your health care provider. Contact a health care provider if:  Your symptoms get worse or you start having new symptoms.  You feel overwhelmed by your problems and can no longer manage them on your own. Get help right away if:  You feel like hurting yourself or someone else. This information is not intended to replace advice given to you by your health care provider. Make sure you discuss any questions you have with your health care provider. Document Released: 05/04/2001 Document Revised: 04/15/2016 Document Reviewed: 07/03/2013 Elsevier Interactive Patient Education  2017 Elsevier Inc.  

## 2018-05-17 LAB — CMP14+EGFR
ALBUMIN: 4.4 g/dL (ref 3.5–5.5)
ALK PHOS: 47 IU/L (ref 39–117)
ALT: 14 IU/L (ref 0–32)
AST: 19 IU/L (ref 0–40)
Albumin/Globulin Ratio: 1.8 (ref 1.2–2.2)
BUN / CREAT RATIO: 17 (ref 9–23)
BUN: 22 mg/dL (ref 6–24)
CO2: 28 mmol/L (ref 20–29)
Calcium: 9.6 mg/dL (ref 8.7–10.2)
Chloride: 104 mmol/L (ref 96–106)
Creatinine, Ser: 1.27 mg/dL — ABNORMAL HIGH (ref 0.57–1.00)
GFR calc non Af Amer: 47 mL/min/{1.73_m2} — ABNORMAL LOW (ref >59)
GFR, EST AFRICAN AMERICAN: 55 mL/min/{1.73_m2} — AB (ref >59)
GLUCOSE: 92 mg/dL (ref 65–99)
Globulin, Total: 2.4 g/dL (ref 1.5–4.5)
Potassium: 3.4 mmol/L — ABNORMAL LOW (ref 3.5–5.2)
Sodium: 145 mmol/L — ABNORMAL HIGH (ref 134–144)
Total Protein: 6.8 g/dL (ref 6.0–8.5)

## 2018-05-17 LAB — CBC WITH DIFFERENTIAL/PLATELET
BASOS ABS: 0.1 10*3/uL (ref 0.0–0.2)
Basos: 1 %
EOS (ABSOLUTE): 0.2 10*3/uL (ref 0.0–0.4)
Eos: 3 %
Hematocrit: 35 % (ref 34.0–46.6)
Hemoglobin: 12 g/dL (ref 11.1–15.9)
IMMATURE GRANS (ABS): 0 10*3/uL (ref 0.0–0.1)
IMMATURE GRANULOCYTES: 0 %
LYMPHS: 36 %
Lymphocytes Absolute: 2.6 10*3/uL (ref 0.7–3.1)
MCH: 28.9 pg (ref 26.6–33.0)
MCHC: 34.3 g/dL (ref 31.5–35.7)
MCV: 84 fL (ref 79–97)
MONOS ABS: 0.6 10*3/uL (ref 0.1–0.9)
Monocytes: 9 %
NEUTROS PCT: 51 %
Neutrophils Absolute: 3.7 10*3/uL (ref 1.4–7.0)
Platelets: 235 10*3/uL (ref 150–450)
RBC: 4.15 x10E6/uL (ref 3.77–5.28)
RDW: 13.9 % (ref 12.3–15.4)
WBC: 7.2 10*3/uL (ref 3.4–10.8)

## 2018-05-17 LAB — THYROID PANEL WITH TSH
FREE THYROXINE INDEX: 2.2 (ref 1.2–4.9)
T3 UPTAKE RATIO: 26 % (ref 24–39)
T4, Total: 8.6 ug/dL (ref 4.5–12.0)
TSH: 5.99 u[IU]/mL — ABNORMAL HIGH (ref 0.450–4.500)

## 2018-05-17 LAB — LIPID PANEL
CHOL/HDL RATIO: 2.7 ratio (ref 0.0–4.4)
Cholesterol, Total: 152 mg/dL (ref 100–199)
HDL: 56 mg/dL (ref >39)
LDL CALC: 75 mg/dL (ref 0–99)
Triglycerides: 105 mg/dL (ref 0–149)
VLDL CHOLESTEROL CAL: 21 mg/dL (ref 5–40)

## 2018-05-19 ENCOUNTER — Encounter (HOSPITAL_COMMUNITY): Payer: Self-pay | Admitting: Hematology

## 2018-05-19 ENCOUNTER — Other Ambulatory Visit: Payer: Self-pay | Admitting: Nurse Practitioner

## 2018-05-19 ENCOUNTER — Other Ambulatory Visit (HOSPITAL_COMMUNITY): Payer: Commercial Managed Care - HMO

## 2018-05-19 ENCOUNTER — Inpatient Hospital Stay (HOSPITAL_COMMUNITY): Payer: 59 | Attending: Hematology | Admitting: Hematology

## 2018-05-19 VITALS — BP 111/53 | HR 54 | Temp 98.5°F | Resp 16 | Wt 208.8 lb

## 2018-05-19 DIAGNOSIS — R160 Hepatomegaly, not elsewhere classified: Secondary | ICD-10-CM | POA: Insufficient documentation

## 2018-05-19 DIAGNOSIS — D373 Neoplasm of uncertain behavior of appendix: Secondary | ICD-10-CM

## 2018-05-19 DIAGNOSIS — Z85038 Personal history of other malignant neoplasm of large intestine: Secondary | ICD-10-CM | POA: Diagnosis not present

## 2018-05-19 DIAGNOSIS — K8020 Calculus of gallbladder without cholecystitis without obstruction: Secondary | ICD-10-CM | POA: Insufficient documentation

## 2018-05-19 DIAGNOSIS — Z9049 Acquired absence of other specified parts of digestive tract: Secondary | ICD-10-CM | POA: Diagnosis not present

## 2018-05-19 DIAGNOSIS — K529 Noninfective gastroenteritis and colitis, unspecified: Secondary | ICD-10-CM | POA: Insufficient documentation

## 2018-05-19 DIAGNOSIS — K760 Fatty (change of) liver, not elsewhere classified: Secondary | ICD-10-CM | POA: Insufficient documentation

## 2018-05-19 MED ORDER — LEVOTHYROXINE SODIUM 112 MCG PO TABS: 112.0000 ug | ORAL_TABLET | Freq: Every day | ORAL | 11 refills | Status: DC

## 2018-05-19 MED ORDER — CHOLESTYRAMINE 4 G PO PACK: 4.0000 g | PACK | Freq: Two times a day (BID) | ORAL | 12 refills | Status: DC

## 2018-05-19 NOTE — Patient Instructions (Signed)
[facility] Discharge Instructions  You saw Dr. Katragadda today.   Thank you for choosing [facility] to provide your oncology and hematology care.  To afford each patient quality time with our provider, please arrive at least 15 minutes before your scheduled appointment time.   If you have a lab appointment with the Cancer Center please come in thru the  Main Entrance and check in at the main information desk  You need to re-schedule your appointment should you arrive 10 or more minutes late.  We strive to give you quality time with our providers, and arriving late affects you and other patients whose appointments are after yours.  Also, if you no show three or more times for appointments you may be dismissed from the clinic at the providers discretion.     Again, thank you for choosing [facility].  Our hope is that these requests will decrease the amount of time that you wait before being seen by our physicians.       _____________________________________________________________  Should you have questions after your visit to [facility], please contact our office at [phone] between the hours of 8:30 a.m. and 4:30 p.m.  Voicemails left after 4:30 p.m. will not be returned until the following business day.  For prescription refill requests, have your pharmacy contact our office.       Resources For Cancer Patients and their Caregivers ? American Cancer Society: Can assist with transportation, wigs, general needs, runs Look Good Feel Better.        1-888-227-6333 ? Cancer Care: Provides financial assistance, online support groups, medication/co-pay assistance.  1-800-813-HOPE (4673) ? [facility] Assists [place] cancer patients and their families through emotional , educational and financial support.  336-427-4357 ? [place] DSS Where to apply for  food stamps, Medicaid and utility assistance. 336-342-1394 ? [facility]: Transportation to medical appointments. 336-347-2287 ? Social Security Administration: May apply for disability if have a Stage IV cancer. 336-342-7796 1-800-772-1213 ? [place] Aging, Disability and Transit Services: Assists with nutrition, care and transit needs. 336-349-2343  Cancer Center Support Programs:   > Cancer Support Group  2nd Tuesday of the month 1pm-2pm, Journey Room   > Creative Journey  3rd Tuesday of the month 1130am-1pm, Journey Room     

## 2018-05-19 NOTE — Assessment & Plan Note (Addendum)
1.  Low-grade mucinous adenocarcinoma of the appendix: -Status post right hemicolectomy at [facility] on 02/02/2016. -Status post CRS (greater and lesser omentectomy, bilateral salpingo-nephrectomy, peritoneal pelvic stripping and resection of diaphragmatic nodules)/HIPEC on 03/29/2016 by Dr. Johney Maine at [place] CT scan of the abdomen and pelvis on 09/02/2017 reviewed by me shows postsurgical changes, hepatomegaly with steatosis and cholelithiasis.  No evidence of recurrence was seen.  Today's abdominal exam was benign. -She will come back in 6 months with repeat labs.  She has another set of scans scheduled in October 2019 at [facility].  We will follow-up on those scans.  2.  Chronic diarrhea: -She has about 6-10 watery stools per day.  She tried taking Imodium which gave her abdominal cramping.  She tried [employer] which did not help.  I have recommended trying Questran 4 g once daily administered as a suspension.  I have told her to increase it to twice a day if it does not help.

## 2018-05-19 NOTE — Progress Notes (Signed)
[address], [place] [address]   CLINIC:  Medical Oncology/Hematology  PCP:  Chevis Pretty, [address] [place] [address] [phone]   REASON FOR VISIT:  Follow-up for mucinous adenocarcinoma of the appendix.  CURRENT THERAPY: Observation.   INTERVAL HISTORY:  Casey [name] 57 y.o. female returns for follow-up of mucinous adenocarcinoma of the appendix.  She denies any severe abdominal pains.  At presentation in 2017, she had right lower quadrant pain.  She has diarrhea about 6-10 stools per day since the surgery.  She tried taking FiberCon which did not help.  Imodium gives her cramping.  Fatigue is stable.  Energy levels are 75%.  Denies any recent hospitalizations or infections.    REVIEW OF SYSTEMS:  Review of Systems  Constitutional: Positive for fatigue.  Gastrointestinal: Positive for diarrhea and nausea.  All other systems reviewed and are negative.    PAST MEDICAL/SURGICAL HISTORY:  Past Medical History:  Diagnosis Date  . Anemia   . Anxiety   . Cancer ([place])    premelanoma per hip   . Cancer of appendix ([place])   . Depression   . DUB (dysfunctional uterine bleeding)   . Dysrhythmia    pt states has PVCs   . GERD (gastroesophageal reflux disease)   . Graves disease   . History of blood transfusion   . History of urinary tract infection    last one approx 4 years ago   . Hyperlipidemia   . Hypertension   . 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 Migraines   . Ovarian cyst    both ovaries  . Palpitations   . Pneumonia    hx of had approx 22 years ago   . PONV (postoperative nausea and vomiting)   . Pulmonary nodule    Past Surgical History:  Procedure Laterality Date  . APPENDECTOMY    . DIAGNOSTIC LAPAROSCOPY    . DILATION AND CURETTAGE OF UTERUS    . infertility testing     . LAPAROSCOPIC PARTIAL COLECTOMY N/A 02/02/2016   Procedure: LAPAROSCOPIC PARTIAL  COlectomy right colon;  Surgeon: Greer Pickerel, MD;  Location: [facility];  Service: General;  Laterality: N/A;  . OOPHORECTOMY     & fallopian tubes  . TONSILLECTOMY    . VAGINAL HYSTERECTOMY    . VESICOVAGINAL FISTULA CLOSURE W/ TAH     Dr. Margaretha Glassing  2 degree DUB      SOCIAL HISTORY:  Social History   Socioeconomic History  . Marital status: Married    Spouse name: Not on file  . Number of children: Not on file  . Years of education: Not on file  . Highest education level: Not on file  Occupational History  . Not on file  Social Needs  . Financial resource strain: Not on file  . Food insecurity:    Worry: Not on file    Inability: Not on file  . Transportation needs:    Medical: Not on file    Non-medical: Not on file  Tobacco Use  . Smoking status: Never Smoker  . Smokeless tobacco: Never Used  Substance and Sexual Activity  . Alcohol use: No  . Drug use: No  . Sexual activity: Not Currently    Partners: Male    Birth control/protection: Surgical    Comment: [facility]  Lifestyle  . Physical activity:    Days per week: Not on file  Minutes per session: Not on file  . Stress: Not on file  Relationships  . Social connections:    Talks on phone: Not on file    Gets together: Not on file    Attends religious service: Not on file    Active member of club or organization: Not on file    Attends meetings of clubs or organizations: Not on file    Relationship status: Not on file  . Intimate partner violence:    Fear of current or ex partner: Not on file    Emotionally abused: Not on file    Physically abused: Not on file    Forced sexual activity: Not on file  Other Topics Concern  . Not on file  Social History Narrative  . Not on file    FAMILY HISTORY:  Family History  Problem Relation Age of Onset  . Hypertension Mother   . Irregular heart beat Mother   . Thyroid disease Maternal Grandmother   . Cancer Maternal Grandmother        lung  . Cancer Paternal  Grandfather        lung    CURRENT MEDICATIONS:  Outpatient Encounter Medications as of 05/19/2018  Medication Sig  . cetirizine (ZYRTEC) 10 MG tablet TAKE ONE (1) TABLET EACH DAY  . escitalopram (LEXAPRO) 10 MG tablet TAKE ONE (1) TABLET EACH DAY  . fenofibrate 160 MG tablet TAKE ONE (1) TABLET EACH DAY  . folic acid (FOLVITE) 1 MG tablet TAKE ONE (1) TABLET EACH DAY  . levothyroxine (SYNTHROID) 112 MCG tablet Take 1 tablet (112 mcg total) by mouth daily.  . metoprolol succinate (TOPROL-XL) 50 MG 24 hr tablet TAKE ONE (1) TABLET EACH DAY  . pantoprazole (PROTONIX) 40 MG tablet Take 1 tablet (40 mg total) by mouth as needed.  . polycarbophil (FIBERCON) 625 MG tablet Take 1 tablet (625 mg total) by mouth daily.  . promethazine (PHENERGAN) 25 MG tablet TAKE 1 TABLET EVERY 6 HOURS AS NEEDED FOR NAUSEA AND VOMITING  . valsartan-hydrochlorothiazide (DIOVAN-HCT) 160-12.5 MG tablet TAKE ONE (1) TABLET EACH DAY  . zolpidem (AMBIEN) 10 MG tablet Take 1 tablet (10 mg total) by mouth at bedtime as needed for sleep.  . cholestyramine (QUESTRAN) 4 g packet Take 1 packet (4 g total) by mouth 2 (two) times daily. Start taking 1 packet daily for 1 week and may increase it to 2 times a day.   No facility-administered encounter medications on file as of 05/19/2018.     ALLERGIES:  Allergies  Allergen Reactions  . Adhesive  [Tape] Itching    BANDAIDS  . Buprenorphine Hcl Itching  . Labetalol Itching  . Morphine And Related Itching  . Other     [place] seafood seasoning   . Zosyn [Piperacillin Sod-Tazobactam So] Rash    Pt states has had dose since and did not have difficulty, rash only on first dose , not in doses following      PHYSICAL EXAM:  ECOG Performance status: 1  Vitals:   05/19/18 1047  BP: (!) 111/53  Pulse: (!) 54  Resp: 16  Temp: 98.5 F (36.9 C)  SpO2: 95%   Filed Weights   05/19/18 1047  Weight: 208 lb 12.8 oz (94.7 kg)    Physical Exam HEENT: No mucositis or other  lesions. Chest: Bilaterally clear to auscultation. CVS: S1-S2 regular rate and rhythm. Abdomen: Midline scar is unchanged.  No palpable masses in the abdomen.  Nontender. Extremities: No edema  cyanosis bilaterally.  LABORATORY DATA:  I have reviewed the labs as listed.  CBC    Component Value Date/Time   WBC 7.2 05/16/2018 1511   WBC 7.4 05/19/2017 1300   RBC 4.15 05/16/2018 1511   RBC 4.14 05/19/2017 1300   HGB 12.0 05/16/2018 1511   HCT 35.0 05/16/2018 1511   PLT 235 05/16/2018 1511   MCV 84 05/16/2018 1511   MCH 28.9 05/16/2018 1511   MCH 29.2 05/19/2017 1300   MCHC 34.3 05/16/2018 1511   MCHC 32.7 05/19/2017 1300   RDW 13.9 05/16/2018 1511   LYMPHSABS 2.6 05/16/2018 1511   MONOABS 0.7 05/19/2017 1300   EOSABS 0.2 05/16/2018 1511   BASOSABS 0.1 05/16/2018 1511   CMP Latest Ref Rng & Units 05/16/2018 09/27/2017 05/19/2017  Glucose 65 - 99 mg/dL 92 93 108(H)  BUN 6 - 24 mg/dL 22 22 27(H)  Creatinine 0.57 - 1.00 mg/dL 1.27(H) 1.13(H) 1.51(H)  Sodium 134 - 144 mmol/L 145(H) 146(H) 140  Potassium 3.5 - 5.2 mmol/L 3.4(L) 3.1(L) 3.2(L)  Chloride 96 - 106 mmol/L 104 107(H) 108  CO2 20 - 29 mmol/L 28 23 25   Calcium 8.7 - 10.2 mg/dL 9.6 9.2 9.6  Total Protein 6.0 - 8.5 g/dL 6.8 6.4 7.4  Total Bilirubin 0.0 - 1.2 mg/dL <0.2 0.2 0.5  Alkaline Phos 39 - 117 IU/L 47 47 41  AST 0 - 40 IU/L 19 19 20   ALT 0 - 32 IU/L 14 14 18        DIAGNOSTIC IMAGING:  I have reviewed CT scan of the abdomen and pelvis done at an outside facility dated 09/02/2017 which shows postsurgical changes.     ASSESSMENT & PLAN:   Low grade mucinous neoplasm of appendix 1.  Low-grade mucinous adenocarcinoma of the appendix: -Status post right hemicolectomy at [facility] on 02/02/2016. -Status post CRS (greater and lesser omentectomy, bilateral salpingo-nephrectomy, peritoneal pelvic stripping and resection of diaphragmatic nodules)/HIPEC on 03/29/2016 by Dr. Johney Maine at [place]  CT scan of the abdomen and pelvis on 09/02/2017 reviewed by me shows postsurgical changes, hepatomegaly with steatosis and cholelithiasis.  No evidence of recurrence was seen.  Today's abdominal exam was benign. -She will come back in 6 months with repeat labs.  She has another set of scans scheduled in October 2019 at [facility].  We will follow-up on those scans.  2.  Chronic diarrhea: -She has about 6-10 watery stools per day.  She tried taking Imodium which gave her abdominal cramping.  She tried [employer] which did not help.  I have recommended trying Questran 4 g once daily administered as a suspension.  I have told her to increase it to twice a day if it does not help.      Orders placed this encounter:  Orders Placed This Encounter  Procedures  . CBC with Differential/Platelet  . Comprehensive metabolic panel      Derek Jack, MD [place] [phone]

## 2018-05-22 ENCOUNTER — Other Ambulatory Visit: Payer: Self-pay | Admitting: Nurse Practitioner

## 2018-05-22 DIAGNOSIS — Z1231 Encounter for screening mammogram for malignant neoplasm of breast: Secondary | ICD-10-CM

## 2018-05-24 ENCOUNTER — Encounter: Payer: Self-pay | Admitting: Certified Nurse Midwife

## 2018-05-24 ENCOUNTER — Other Ambulatory Visit: Payer: Self-pay

## 2018-05-24 ENCOUNTER — Ambulatory Visit
Admission: RE | Admit: 2018-05-24 | Discharge: 2018-05-24 | Disposition: A | Discharge status: Home / Self Care | Payer: 59 | Source: Ambulatory Visit | Attending: Nurse Practitioner | Admitting: Nurse Practitioner

## 2018-05-24 ENCOUNTER — Ambulatory Visit (INDEPENDENT_AMBULATORY_CARE_PROVIDER_SITE_OTHER): Payer: 59 | Admitting: Certified Nurse Midwife

## 2018-05-24 VITALS — BP 110/64 | HR 64 | Resp 16 | Ht 69.75 in | Wt 208.0 lb

## 2018-05-24 DIAGNOSIS — N951 Menopausal and female climacteric states: Secondary | ICD-10-CM

## 2018-05-24 DIAGNOSIS — K6289 Other specified diseases of anus and rectum: Secondary | ICD-10-CM

## 2018-05-24 DIAGNOSIS — Z1231 Encounter for screening mammogram for malignant neoplasm of breast: Secondary | ICD-10-CM | POA: Diagnosis not present

## 2018-05-24 DIAGNOSIS — Z01419 Encounter for gynecological examination (general) (routine) without abnormal findings: Secondary | ICD-10-CM

## 2018-05-24 IMAGING — MG DIGITAL SCREENING BILATERAL MAMMOGRAM WITH TOMO AND CAD
8 series · 8 of 24 positions shown · non-contrast
Comparison: Previous exam(s).

CLINICAL DATA: Screening.

EXAM:
DIGITAL SCREENING BILATERAL MAMMOGRAM WITH TOMO AND CAD

[R MLO synth-2D]
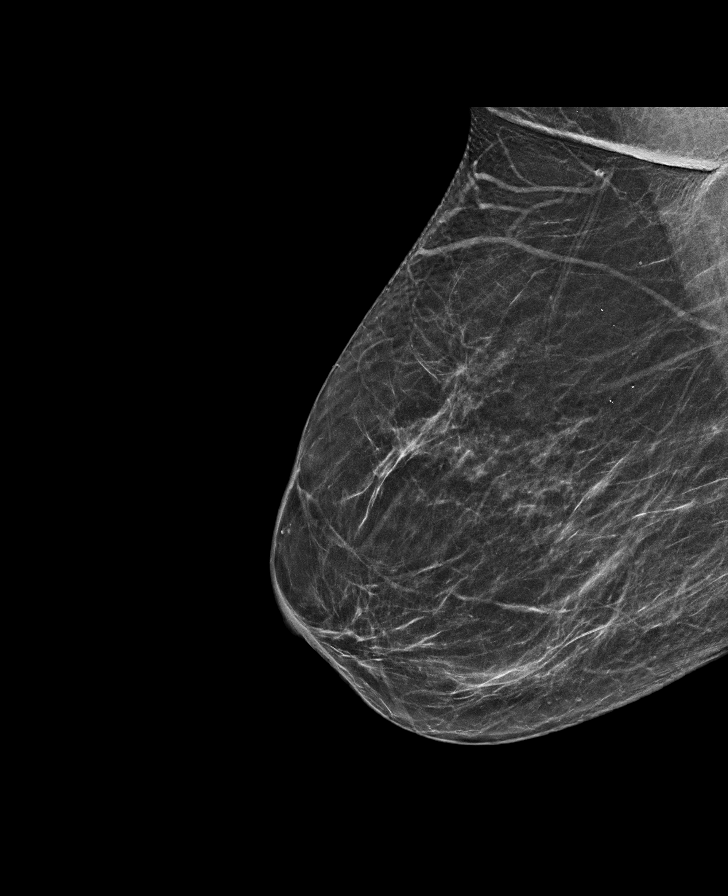

[R CC synth-2D]
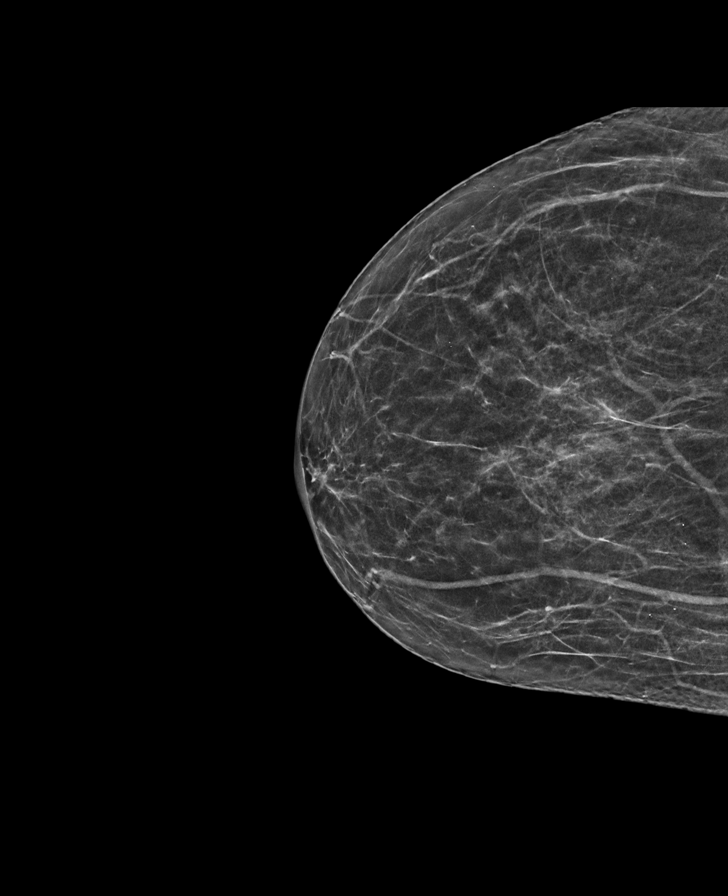

[L MLO synth-2D]
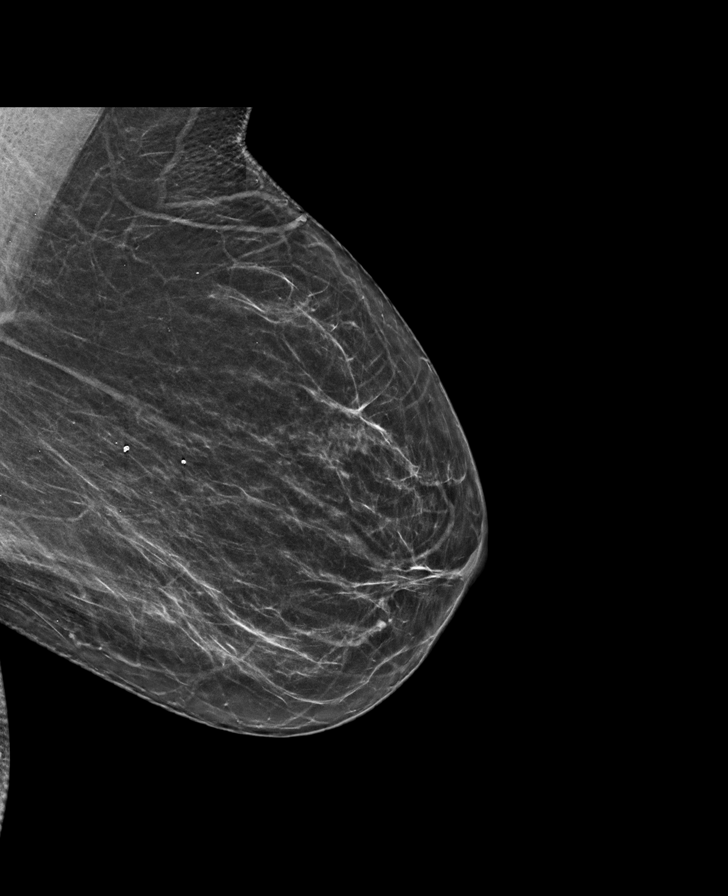

[L CC synth-2D]
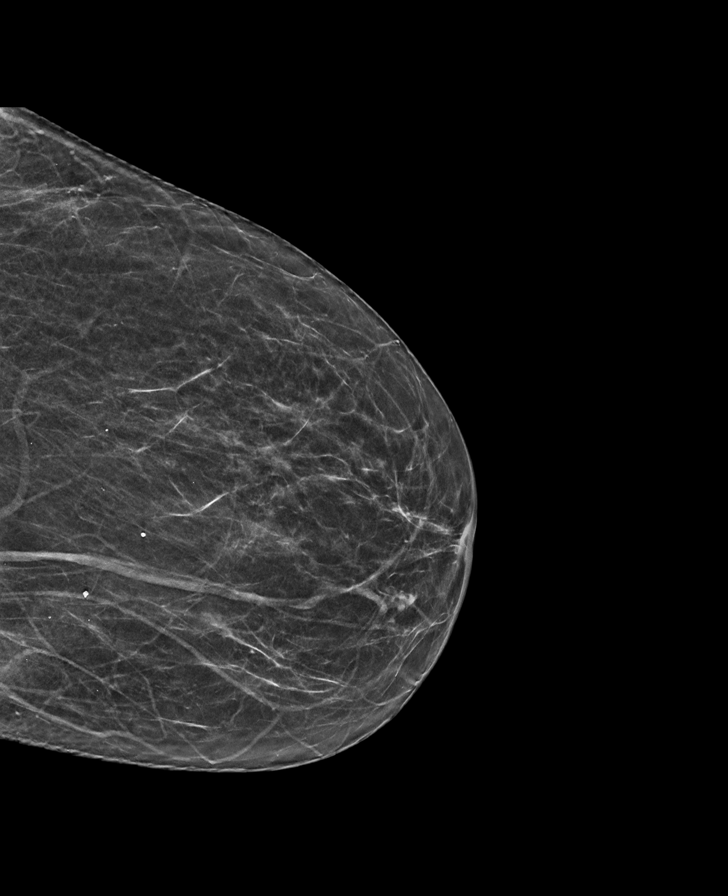

[R CC tomo · tomo slice 27/54.0]
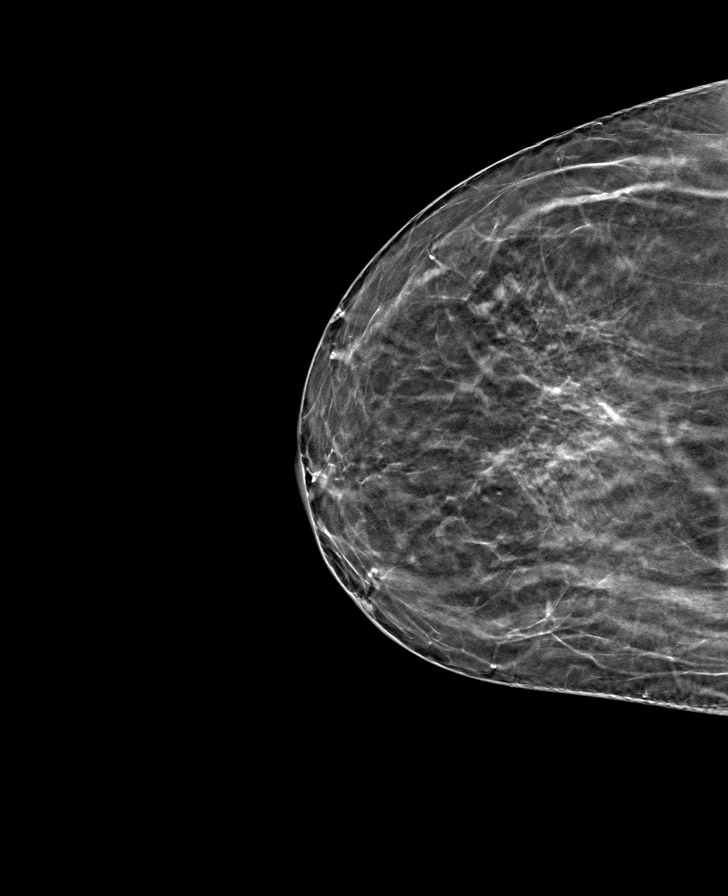

[L MLO tomo · tomo slice 34/67.0]
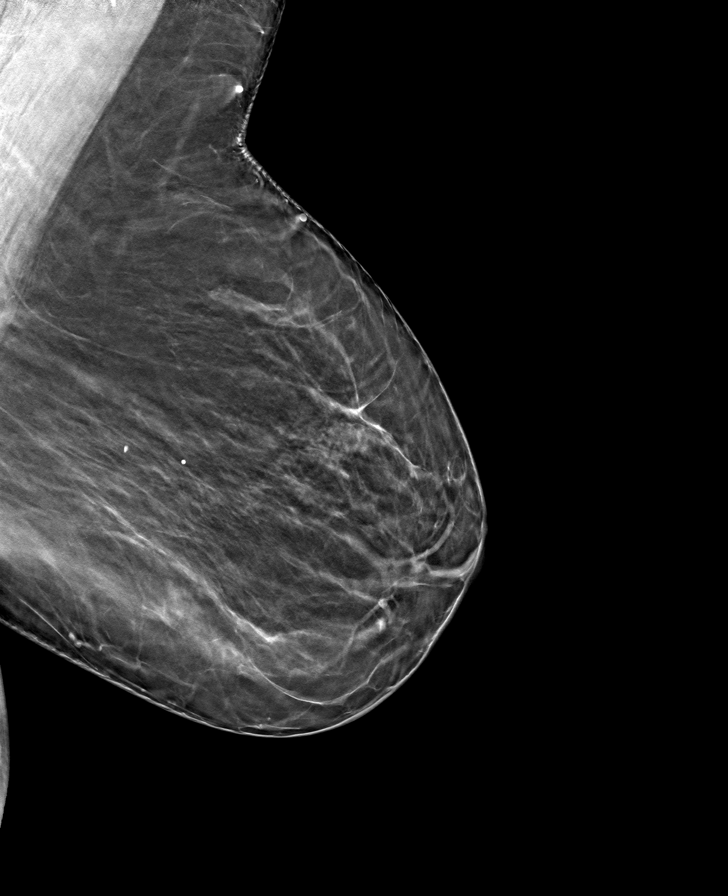

[R MLO tomo · tomo slice 33/64.0]
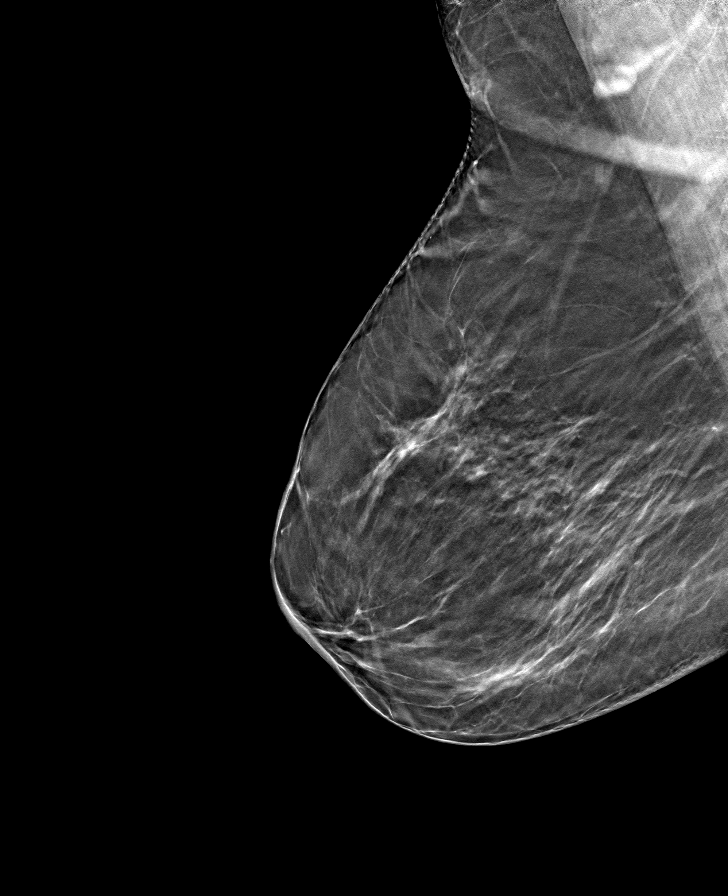

[L CC tomo · tomo slice 29/57.0]
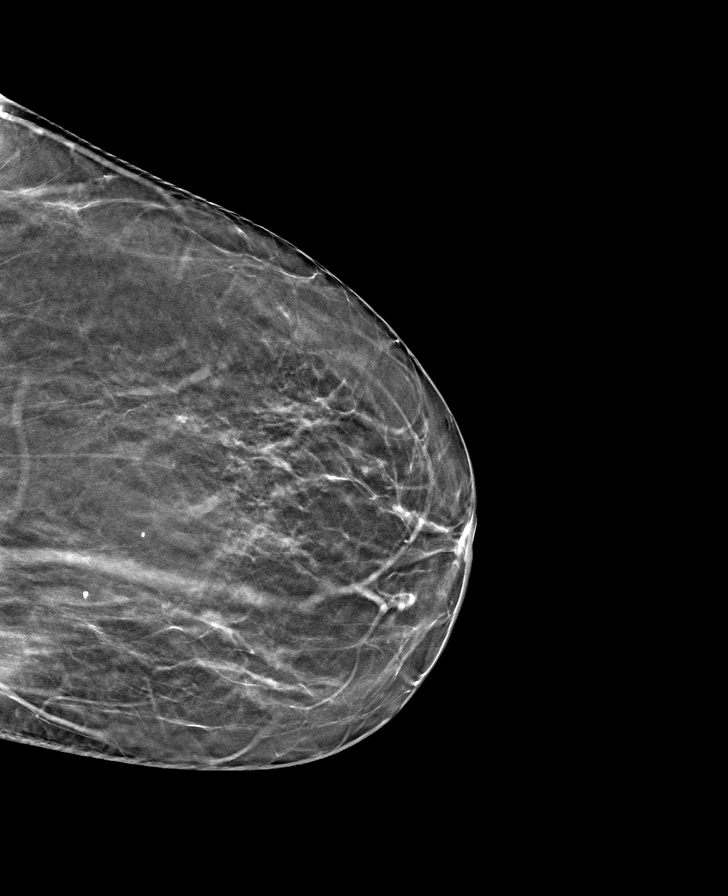

[8 of 24 positions shown; findings below may reference images not displayed]

ACR Breast Density Category b: There are scattered areas of
fibroglandular density.
FINDINGS: There are no findings suspicious for malignancy. Images were
processed with CAD.
IMPRESSION: No mammographic evidence of malignancy. A result letter of this
screening mammogram will be mailed directly to the patient.

RECOMMENDATION:
Screening mammogram in one year. (Code:[TQ])

BI-RADS CATEGORY  1: Negative.

## 2018-05-24 NOTE — Patient Instructions (Signed)

## 2018-05-24 NOTE — Progress Notes (Signed)
57 y.o. [id number] Married  Caucasian Fe here for annual exam. Menopausal no HRT. Denies vaginal bleeding.  Patient still under follow up with Oncology regarding colon/intestinal cancer. Sees Dr. Hassell Done for hypertension,hypothyroid management and labs. All normal per patient. Had mammogram this am. Weight maintaining. Getting exercise when possible. Some rectal irritation from emptying bowels frequently. No other health issues today.  Patient's last menstrual period was 05/23/1995.          Sexually active: Yes.    The current method of family planning is status post hysterectomy.    Exercising: Yes.    exercise Smoker:  no  Health Maintenance: Pap:  05-31-10 neg History of Abnormal Pap: yes 1985 MMG:  05-24-18 category b density birads 1:neg Self Breast exams: no Colonoscopy:  8/18 f/u 37yrs BMD:   none TDaP:  2016 Shingles: no Pneumonia: no Hep C and HIV: HIV tested yrs ago-neg Labs:  With PCP   reports that she has never smoked. She has never used smokeless tobacco. She reports that she does not drink alcohol or use drugs.  Past Medical History:  Diagnosis Date  . Anemia   . Anxiety   . Cancer ([place])    premelanoma per hip   . Cancer of appendix ([place])   . Depression   . DUB (dysfunctional uterine bleeding)   . Dysrhythmia    pt states has PVCs   . Gallstones   . GERD (gastroesophageal reflux disease)   . Graves disease   . History of blood transfusion   . History of urinary tract infection    last one approx 4 years ago   . Hyperlipidemia   . Hypertension   . 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 Migraines   . Ovarian cyst    both ovaries  . Palpitations   . Pneumonia    hx of had approx 22 years ago   . PONV (postoperative nausea and vomiting)   . Pulmonary nodule     Past Surgical History:  Procedure Laterality Date  . APPENDECTOMY    . DIAGNOSTIC LAPAROSCOPY    . DILATION AND CURETTAGE OF UTERUS    . infertility testing     .  LAPAROSCOPIC PARTIAL COLECTOMY N/A 02/02/2016   Procedure: LAPAROSCOPIC PARTIAL COlectomy right colon;  Surgeon: Greer Pickerel, MD;  Location: [facility];  Service: General;  Laterality: N/A;  . OOPHORECTOMY     & fallopian tubes  . TONSILLECTOMY    . VAGINAL HYSTERECTOMY     total  . VESICOVAGINAL FISTULA CLOSURE W/ TAH     Dr. Margaretha Glassing  2 degree DUB     Current Outpatient Medications  Medication Sig Dispense Refill  . cetirizine (ZYRTEC) 10 MG tablet TAKE ONE (1) TABLET EACH DAY 90 tablet 0  . escitalopram (LEXAPRO) 10 MG tablet TAKE ONE (1) TABLET EACH DAY 90 tablet 1  . fenofibrate 160 MG tablet TAKE ONE (1) TABLET EACH DAY 30 tablet 5  . folic acid (FOLVITE) 1 MG tablet TAKE ONE (1) TABLET EACH DAY 90 tablet 3  . levothyroxine (SYNTHROID) 112 MCG tablet Take 1 tablet (112 mcg total) by mouth daily. 30 tablet 11  . metoprolol succinate (TOPROL-XL) 50 MG 24 hr tablet TAKE ONE (1) TABLET EACH DAY 30 tablet 5  . pantoprazole (PROTONIX) 40 MG tablet Take 1 tablet (40 mg total) by mouth as needed. 30 tablet 5  . promethazine (PHENERGAN) 25 MG tablet TAKE 1 TABLET EVERY 6  HOURS AS NEEDED FOR NAUSEA AND VOMITING 30 tablet 0  . valsartan-hydrochlorothiazide (DIOVAN-HCT) 160-12.5 MG tablet TAKE ONE (1) TABLET EACH DAY 30 tablet 5  . zolpidem (AMBIEN) 10 MG tablet Take 1 tablet (10 mg total) by mouth at bedtime as needed for sleep. 30 tablet 2   No current facility-administered medications for this visit.     Family History  Problem Relation Age of Onset  . Hypertension Mother   . Irregular heart beat Mother   . Thyroid disease Maternal Grandmother   . Cancer Maternal Grandmother        lung  . Cancer Paternal Grandfather        lung    ROS:  Pertinent items are noted in HPI.  Otherwise, a comprehensive ROS was negative.  Exam:   BP 110/64   Pulse 64   Resp 16   Ht 5' 9.75" (1.772 m)   Wt 208 lb (94.3 kg)   LMP 05/23/1995   BMI 30.06 kg/m  Height: 5' 9.75" (177.2 cm) Ht Readings  from Last 3 Encounters:  05/24/18 5' 9.75" (1.772 m)  05/16/18 5\' 10"  (1.778 m)  09/27/17 5\' 10"  (1.778 m)    General appearance: alert, cooperative and appears stated age Head: Normocephalic, without obvious abnormality, atraumatic Neck: no adenopathy, supple, symmetrical, trachea midline and thyroid normal to inspection and palpation Lungs: clear to auscultation bilaterally Breasts: normal appearance, no masses or tenderness, No nipple retraction or dimpling, No nipple discharge or bleeding, No axillary or supraclavicular adenopathy Heart: regular rate and rhythm Abdomen: soft, non-tender; no masses,  no organomegaly Extremities: extremities normal, atraumatic, no cyanosis or edema Skin: Skin color, texture, turgor normal. No rashes or lesions Lymph nodes: Cervical, supraclavicular, and axillary nodes normal. No abnormal inguinal nodes palpated Neurologic: Grossly normal   Pelvic: External genitalia:  no lesions              Urethra:  normal appearing urethra with no masses, tenderness or lesions              Bartholin's and Skene's: normal                 Vagina: normal appearing vagina with normal color and discharge, no lesions              Cervix: absent              Pap taken: No. Bimanual Exam:  Uterus:  uterus absent              Adnexa: no mass, fullness, tenderness               Rectovaginal: Confirms               Anus:  normal sphincter tone, no lesions  Chaperone present: yes  A:  Well Woman with normal exam  Menopausal no HRT  History of Low grade mucinous neoplasm of appendix still under follow up with oncology  Rectal irritation  Hypertension,hyperlipidemia with PCP managment  P:   Reviewed health and wellness pertinent to exam  Continue follow up with PCP as indicated  Discussed using Balmex for skin irritation and protection after frequent stools  Pap smear: no   counseled on breast self exam, mammography screening, feminine hygiene, adequate intake of  calcium and vitamin D, diet and exercise  return annually or prn  An After Visit Summary was printed and given to the patient.

## 2018-06-12 ENCOUNTER — Ambulatory Visit: Payer: 59

## 2018-06-20 ENCOUNTER — Other Ambulatory Visit: Payer: Self-pay | Admitting: Physician Assistant

## 2018-06-20 ENCOUNTER — Other Ambulatory Visit: Payer: Self-pay | Admitting: Nurse Practitioner

## 2018-06-20 DIAGNOSIS — D485 Neoplasm of uncertain behavior of skin: Secondary | ICD-10-CM | POA: Diagnosis not present

## 2018-06-20 DIAGNOSIS — C44519 Basal cell carcinoma of skin of other part of trunk: Secondary | ICD-10-CM | POA: Diagnosis not present

## 2018-06-20 NOTE — Telephone Encounter (Signed)
Last seen 05/16/18  MMM

## 2018-06-28 ENCOUNTER — Other Ambulatory Visit: Payer: Self-pay | Admitting: Nurse Practitioner

## 2018-06-28 MED ORDER — SCOPOLAMINE 1 MG/3DAYS TD PT72: 1.0000 | MEDICATED_PATCH | TRANSDERMAL | 1 refills | Status: DC

## 2018-07-17 ENCOUNTER — Other Ambulatory Visit: Payer: Self-pay | Admitting: Nurse Practitioner

## 2018-07-17 DIAGNOSIS — F5101 Primary insomnia: Secondary | ICD-10-CM

## 2018-07-18 NOTE — Telephone Encounter (Signed)
Last seen 05/16/18  MMM

## 2018-07-20 IMAGING — US US ABDOMEN LIMITED
1 series · 14 of 25 positions shown · non-contrast
Comparison: None.

CLINICAL DATA: 56-year-old female with right upper quadrant pain
for 2 weeks.

EXAM:
ULTRASOUND ABDOMEN LIMITED RIGHT UPPER QUADRANT

[Series 1: us abdomen limited · 0.24mm/px · 14 of 75 slices shown]
[im 1/75]
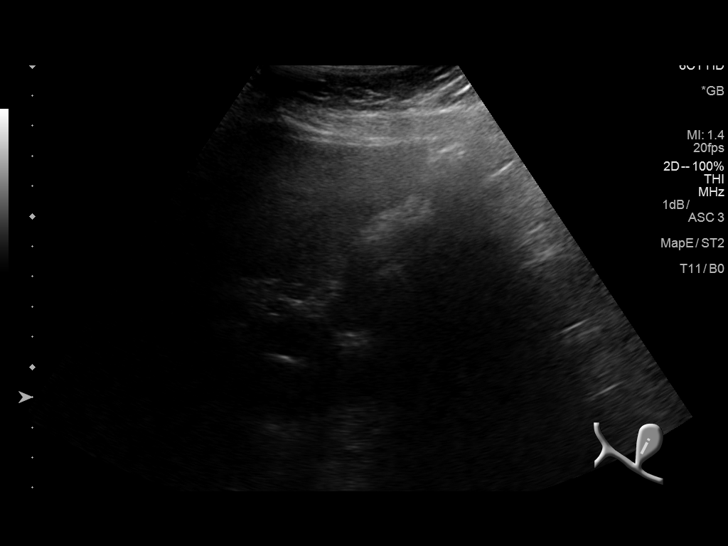
[im 7/75]
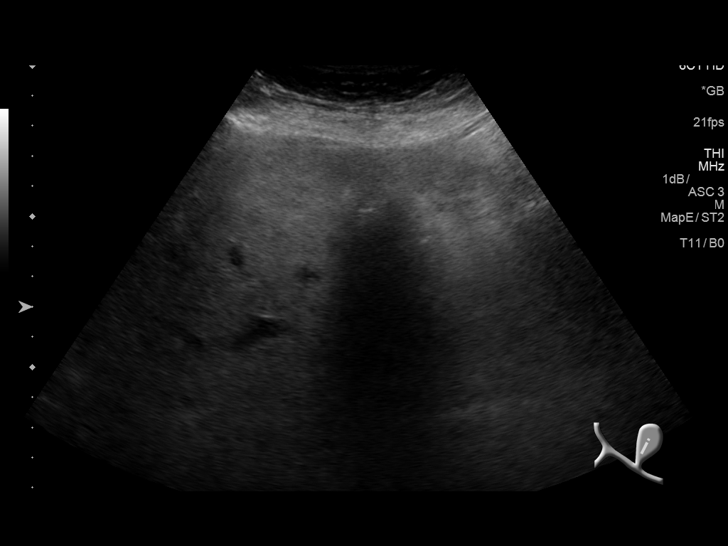
[im 13/75]
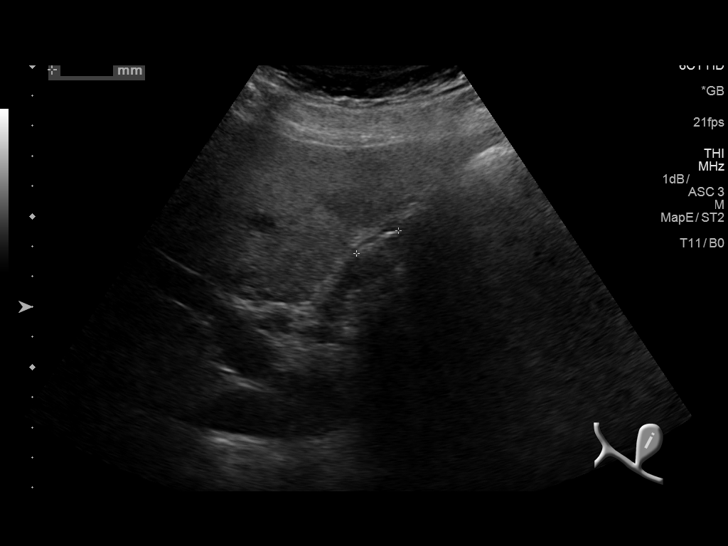
[im 19/75]
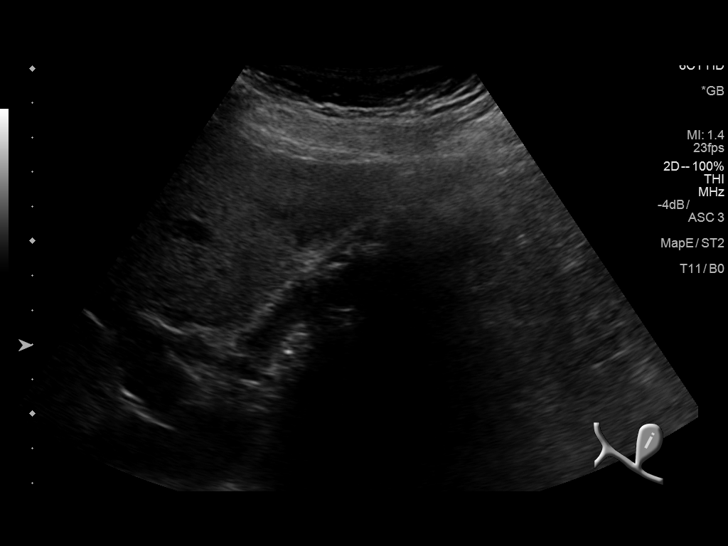
[im 25/75]
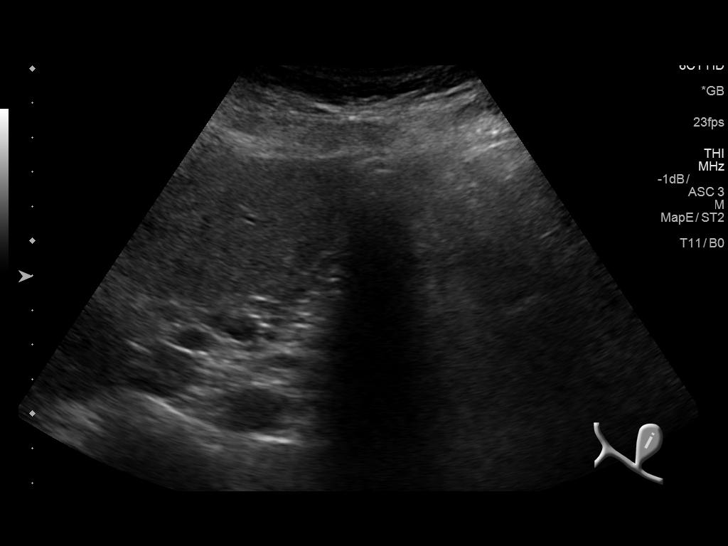
[im 28/75]
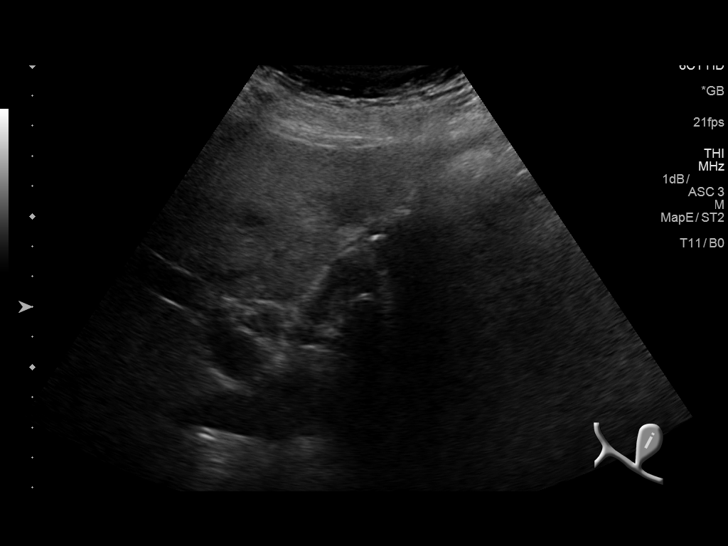
[im 34/75]
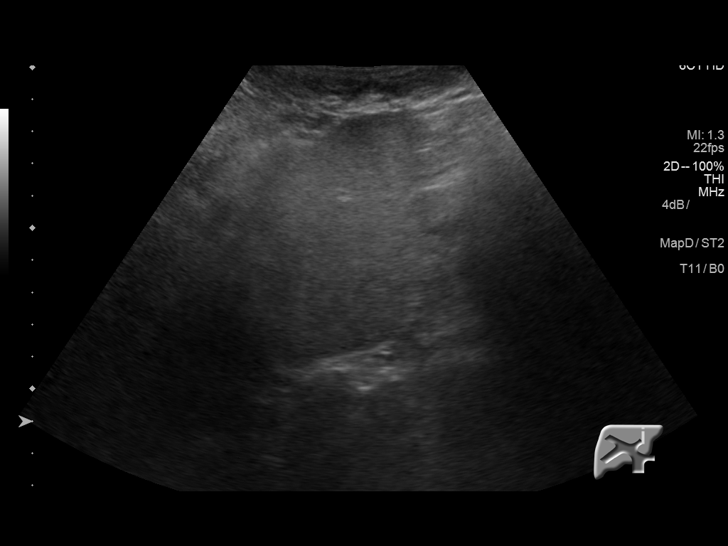
[im 41/75]
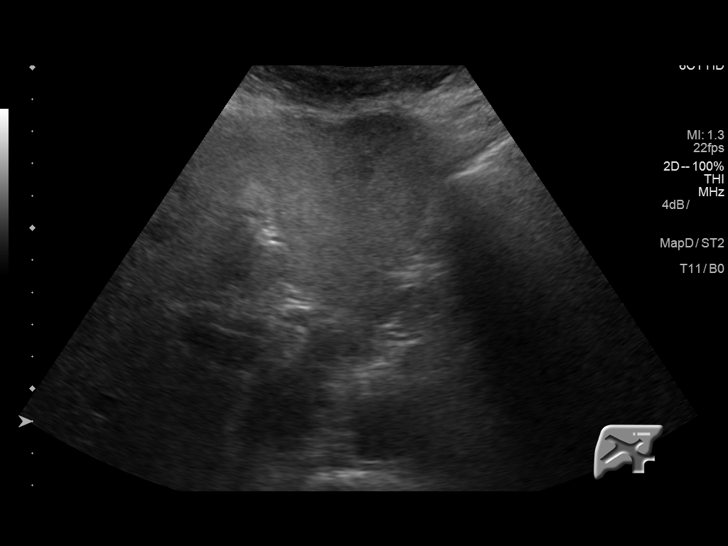
[im 47/75]
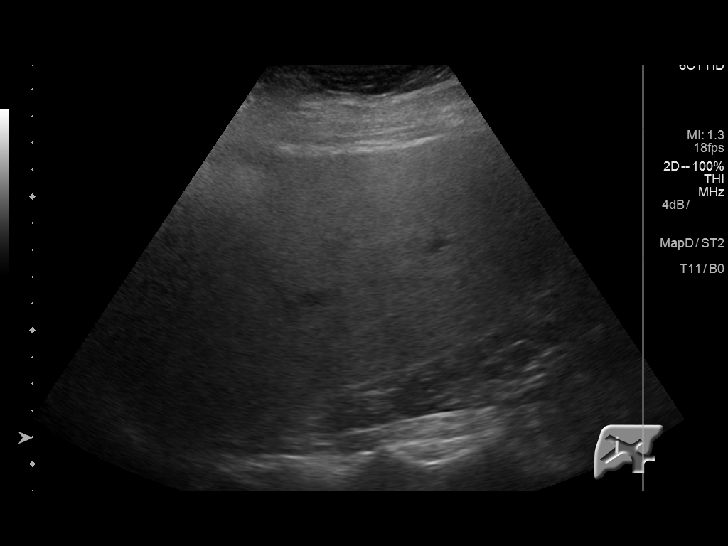
[im 50/75]
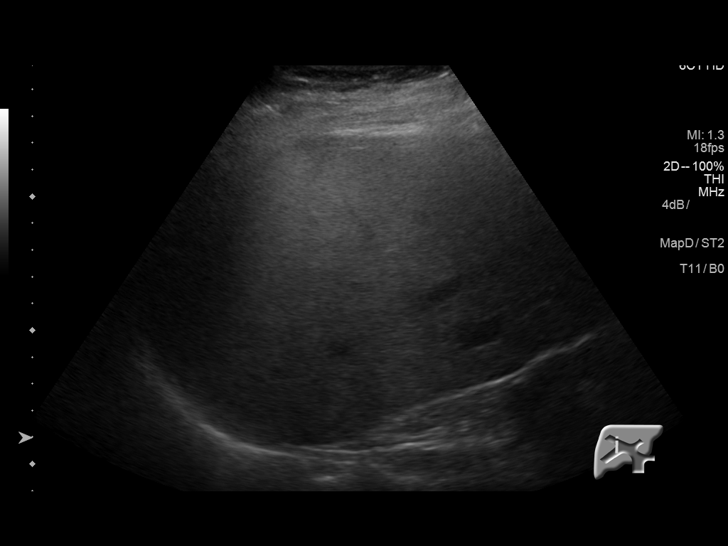
[im 56/75]
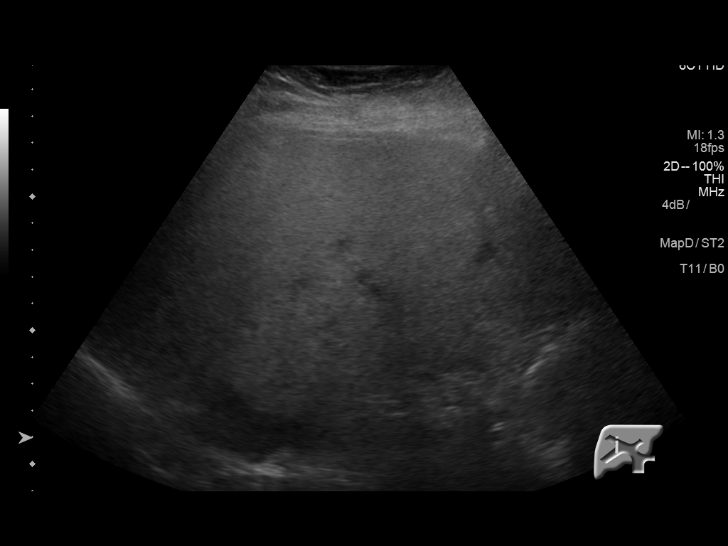
[im 62/75]
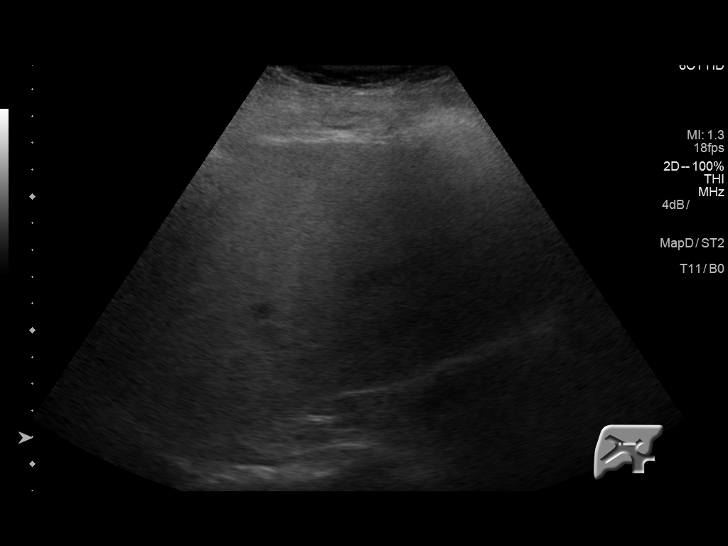
[im 68/75]
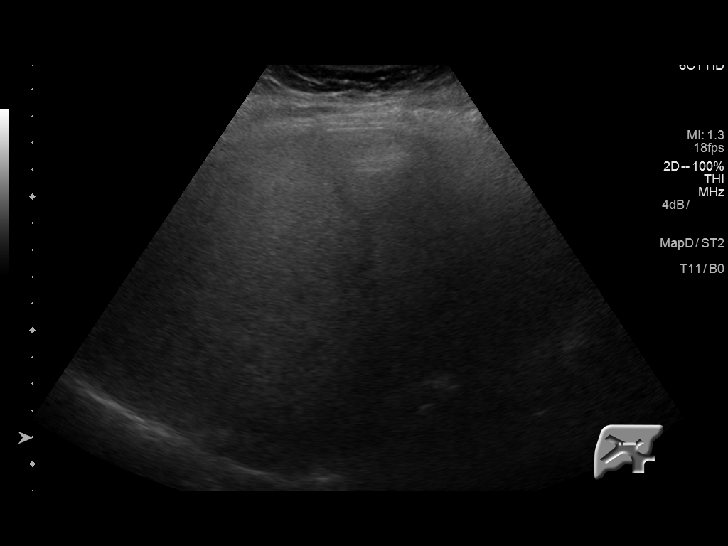
[im 75/75]
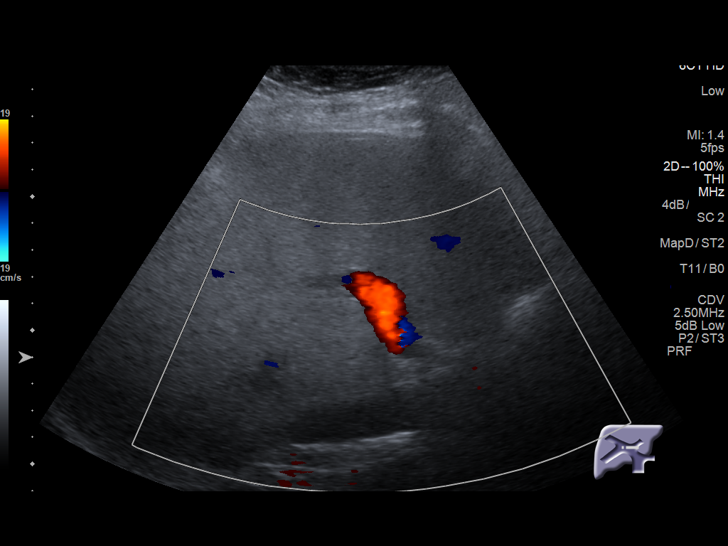

[14 of 25 positions shown; findings below may reference images not displayed]

FINDINGS: Gallbladder:

Multiple gallstones measuring up to 1.6 cm. Gallbladder wall at
points appears slightly thickened measuring up to 4.2 mm. Patient
was not tender over this region during scanning per ultrasound
technologist. No obvious peri cholecystic fluid although shadowing
from stones limits evaluation.

Common bile duct:

Diameter: 4.3 mm

Liver:

Diffuse increased echogenicity suggestive of fatty infiltration.
Fibrosis secondary less likely consideration. No dominant liver mass
or intrahepatic biliary duct dilation. Portal vein is patent on
color Doppler imaging with normal direction of blood flow towards
the liver.
IMPRESSION: Multiple gallstones measuring up to 1.6 cm. Gallbladder wall at
points appears slightly thickened measuring up to 4.2 mm. Patient
was not tender over this region during scanning per ultrasound
technologist.

Fatty liver.

This is call report.

## 2018-08-18 ENCOUNTER — Other Ambulatory Visit: Payer: Self-pay | Admitting: Nurse Practitioner

## 2018-08-18 MED ORDER — AZITHROMYCIN 250 MG PO TABS: ORAL_TABLET | ORAL | 0 refills | Status: DC

## 2018-09-13 ENCOUNTER — Telehealth: Payer: 59 | Admitting: Nurse Practitioner

## 2018-09-13 DIAGNOSIS — N3000 Acute cystitis without hematuria: Secondary | ICD-10-CM

## 2018-09-13 MED ORDER — CIPROFLOXACIN HCL 500 MG PO TABS: 500.0000 mg | ORAL_TABLET | Freq: Two times a day (BID) | ORAL | 0 refills | Status: DC

## 2018-09-13 NOTE — Progress Notes (Signed)

## 2018-09-15 DIAGNOSIS — C181 Malignant neoplasm of appendix: Secondary | ICD-10-CM | POA: Diagnosis not present

## 2018-09-15 DIAGNOSIS — Z9049 Acquired absence of other specified parts of digestive tract: Secondary | ICD-10-CM | POA: Diagnosis not present

## 2018-09-15 DIAGNOSIS — Z08 Encounter for follow-up examination after completed treatment for malignant neoplasm: Secondary | ICD-10-CM | POA: Diagnosis not present

## 2018-09-15 DIAGNOSIS — Z85038 Personal history of other malignant neoplasm of large intestine: Secondary | ICD-10-CM | POA: Diagnosis not present

## 2018-09-19 ENCOUNTER — Other Ambulatory Visit: Payer: Self-pay | Admitting: Nurse Practitioner

## 2018-09-19 DIAGNOSIS — F5101 Primary insomnia: Secondary | ICD-10-CM

## 2018-09-20 NOTE — Telephone Encounter (Signed)
Last seen 05/16/18  MMM

## 2018-09-26 ENCOUNTER — Ambulatory Visit (INDEPENDENT_AMBULATORY_CARE_PROVIDER_SITE_OTHER): Payer: 59

## 2018-09-26 DIAGNOSIS — Z23 Encounter for immunization: Secondary | ICD-10-CM | POA: Diagnosis not present

## 2018-10-17 ENCOUNTER — Other Ambulatory Visit: Payer: Self-pay | Admitting: Nurse Practitioner

## 2018-10-17 DIAGNOSIS — F5101 Primary insomnia: Secondary | ICD-10-CM

## 2018-10-17 NOTE — Telephone Encounter (Signed)
Last seen 05/17/18  MMM

## 2018-11-13 ENCOUNTER — Inpatient Hospital Stay (HOSPITAL_COMMUNITY): Payer: 59 | Attending: Hematology

## 2018-11-13 DIAGNOSIS — I10 Essential (primary) hypertension: Secondary | ICD-10-CM | POA: Diagnosis not present

## 2018-11-13 DIAGNOSIS — Z8589 Personal history of malignant neoplasm of other organs and systems: Secondary | ICD-10-CM | POA: Diagnosis present

## 2018-11-13 DIAGNOSIS — Z79899 Other long term (current) drug therapy: Secondary | ICD-10-CM | POA: Diagnosis not present

## 2018-11-13 DIAGNOSIS — D373 Neoplasm of uncertain behavior of appendix: Secondary | ICD-10-CM

## 2018-11-13 DIAGNOSIS — Z905 Acquired absence of kidney: Secondary | ICD-10-CM | POA: Diagnosis not present

## 2018-11-13 DIAGNOSIS — E876 Hypokalemia: Secondary | ICD-10-CM | POA: Insufficient documentation

## 2018-11-13 DIAGNOSIS — R197 Diarrhea, unspecified: Secondary | ICD-10-CM | POA: Insufficient documentation

## 2018-11-13 LAB — CBC WITH DIFFERENTIAL/PLATELET
Abs Immature Granulocytes: 0.03 10*3/uL (ref 0.00–0.07)
BASOS ABS: 0.1 10*3/uL (ref 0.0–0.1)
Basophils Relative: 1 %
Eosinophils Absolute: 0.2 10*3/uL (ref 0.0–0.5)
Eosinophils Relative: 3 %
HCT: 38.6 % (ref 36.0–46.0)
HEMOGLOBIN: 12.2 g/dL (ref 12.0–15.0)
IMMATURE GRANULOCYTES: 0 %
LYMPHS PCT: 33 %
Lymphs Abs: 2.4 10*3/uL (ref 0.7–4.0)
MCH: 28.5 pg (ref 26.0–34.0)
MCHC: 31.6 g/dL (ref 30.0–36.0)
MCV: 90.2 fL (ref 80.0–100.0)
Monocytes Absolute: 0.6 10*3/uL (ref 0.1–1.0)
Monocytes Relative: 9 %
NEUTROS ABS: 3.8 10*3/uL (ref 1.7–7.7)
NEUTROS PCT: 54 %
Platelets: 233 10*3/uL (ref 150–400)
RBC: 4.28 MIL/uL (ref 3.87–5.11)
RDW: 12.8 % (ref 11.5–15.5)
WBC: 7.1 10*3/uL (ref 4.0–10.5)
nRBC: 0 % (ref 0.0–0.2)

## 2018-11-13 LAB — COMPREHENSIVE METABOLIC PANEL
ALBUMIN: 4 g/dL (ref 3.5–5.0)
ALK PHOS: 33 U/L — AB (ref 38–126)
ALT: 20 U/L (ref 0–44)
ANION GAP: 7 (ref 5–15)
AST: 18 U/L (ref 15–41)
BUN: 20 mg/dL (ref 6–20)
CALCIUM: 9.5 mg/dL (ref 8.9–10.3)
CO2: 28 mmol/L (ref 22–32)
Chloride: 107 mmol/L (ref 98–111)
Creatinine, Ser: 1.16 mg/dL — ABNORMAL HIGH (ref 0.44–1.00)
GFR calc Af Amer: >60 mL/min (ref >60)
GFR calc non Af Amer: 52 mL/min — ABNORMAL LOW (ref >60)
GLUCOSE: 95 mg/dL (ref 70–99)
Potassium: 3.3 mmol/L — ABNORMAL LOW (ref 3.5–5.1)
Sodium: 142 mmol/L (ref 135–145)
Total Bilirubin: 0.5 mg/dL (ref 0.3–1.2)
Total Protein: 7 g/dL (ref 6.5–8.1)

## 2018-11-16 ENCOUNTER — Encounter: Payer: Self-pay | Admitting: Nurse Practitioner

## 2018-11-16 ENCOUNTER — Ambulatory Visit: Payer: 59 | Admitting: Nurse Practitioner

## 2018-11-16 VITALS — BP 102/68 | HR 66 | Temp 97.9°F | Ht 69.75 in | Wt 206.0 lb

## 2018-11-16 DIAGNOSIS — K219 Gastro-esophageal reflux disease without esophagitis: Secondary | ICD-10-CM

## 2018-11-16 DIAGNOSIS — F5101 Primary insomnia: Secondary | ICD-10-CM

## 2018-11-16 DIAGNOSIS — I10 Essential (primary) hypertension: Secondary | ICD-10-CM | POA: Diagnosis not present

## 2018-11-16 DIAGNOSIS — E039 Hypothyroidism, unspecified: Secondary | ICD-10-CM

## 2018-11-16 DIAGNOSIS — E0500 Thyrotoxicosis with diffuse goiter without thyrotoxic crisis or storm: Secondary | ICD-10-CM

## 2018-11-16 DIAGNOSIS — F3342 Major depressive disorder, recurrent, in full remission: Secondary | ICD-10-CM

## 2018-11-16 DIAGNOSIS — E7800 Pure hypercholesterolemia, unspecified: Secondary | ICD-10-CM | POA: Diagnosis not present

## 2018-11-16 MED ORDER — METOPROLOL SUCCINATE ER 50 MG PO TB24: ORAL_TABLET | ORAL | 1 refills | Status: DC

## 2018-11-16 MED ORDER — FENOFIBRATE 160 MG PO TABS: ORAL_TABLET | ORAL | 1 refills | Status: DC

## 2018-11-16 MED ORDER — VALSARTAN-HYDROCHLOROTHIAZIDE 160-12.5 MG PO TABS: ORAL_TABLET | ORAL | 1 refills | Status: DC

## 2018-11-16 MED ORDER — ESCITALOPRAM OXALATE 10 MG PO TABS: ORAL_TABLET | ORAL | 1 refills | Status: DC

## 2018-11-16 MED ORDER — LEVOTHYROXINE SODIUM 112 MCG PO TABS: 112.0000 ug | ORAL_TABLET | Freq: Every day | ORAL | 1 refills | Status: DC

## 2018-11-16 MED ORDER — ZOLPIDEM TARTRATE 10 MG PO TABS: 10.0000 mg | ORAL_TABLET | Freq: Every evening | ORAL | 5 refills | Status: DC | PRN

## 2018-11-16 MED ORDER — PANTOPRAZOLE SODIUM 40 MG PO TBEC: 40.0000 mg | DELAYED_RELEASE_TABLET | ORAL | 1 refills | Status: DC | PRN

## 2018-11-16 NOTE — Progress Notes (Signed)
Subjective:    Patient ID: Casey [name], female    DOB: February 16, 1961, 57 y.o.   MRN: [id number]   Chief Complaint: medical management of chronic issues  HPI:  1. Essential hypertension  No c/o chest pain, sob or headache. Does not check blood pressure at home. BP Readings from Last 3 Encounters:  05/24/18 110/64  05/19/18 (!) 111/53  05/16/18 119/77     2. Gastroesophageal reflux disease without esophagitis  Is on protonix dialy. She has had GERD issues since she had her appendix out several years ago. Does well on medication  3. Acquired hypothyroidism  Had graves disease which was treated with radioactive iodine and now has to take thyroid replacement medication. She denies any issues.  4. Pure hypercholesterolemia  Has been watching diet but not doing much dedicated exercise  5. Recurrent major depressive disorder, in full remission (Casey [name])  Is currently on lexapro and is doing well. Depression screen Casey [name] 2/9 11/16/2018 05/16/2018 09/27/2017  Decreased Interest 0 0 0  Down, Depressed, Hopeless 0 0 0  PHQ - 2 Score 0 0 0     6. Graves' disease  Was treated years ago. Now has hypothyroidism    Outpatient Encounter Medications as of 11/16/2018  Medication Sig  . ALL DAY ALLERGY 10 MG tablet TAKE ONE (1) TABLET EACH DAY  . azithromycin (ZITHROMAX Z-PAK) 250 MG tablet As directed  . ciprofloxacin (CIPRO) 500 MG tablet Take 1 tablet (500 mg total) by mouth 2 (two) times daily.  Marland Kitchen escitalopram (LEXAPRO) 10 MG tablet TAKE ONE (1) TABLET EACH DAY  . fenofibrate 160 MG tablet TAKE ONE (1) TABLET EACH DAY  . folic acid (FOLVITE) 1 MG tablet TAKE ONE (1) TABLET EACH DAY  . levothyroxine (SYNTHROID) 112 MCG tablet Take 1 tablet (112 mcg total) by mouth daily.  . metoprolol succinate (TOPROL-XL) 50 MG 24 hr tablet TAKE ONE (1) TABLET EACH DAY  . pantoprazole (PROTONIX) 40 MG tablet Take 1 tablet (40 mg total) by mouth as needed.  . promethazine (PHENERGAN) 25 MG tablet TAKE 1 TABLET  EVERY 6 HOURS AS NEEDED FOR NAUSEA AND VOMITING  . scopolamine (TRANSDERM-SCOP, 1.5 MG,) 1 MG/3DAYS Place 1 patch (1.5 mg total) onto the skin every 3 (three) days.  . valsartan-hydrochlorothiazide (DIOVAN-HCT) 160-12.5 MG tablet TAKE ONE (1) TABLET EACH DAY  . zolpidem (AMBIEN) 10 MG tablet TAKE 1 TABLET AT BEDTIME AS NEEDED FOR SLEEP     New complaints: None today  Social history: Doing well. Is working full time and handling it well.   Review of Systems  Constitutional: Negative for activity change and appetite change.  HENT: Negative.   Eyes: Negative for pain.  Respiratory: Negative for shortness of breath.   Cardiovascular: Negative for chest pain, palpitations and leg swelling.  Gastrointestinal: Negative for abdominal pain.  Endocrine: Negative for polydipsia.  Genitourinary: Negative.   Skin: Negative for rash.  Neurological: Negative for dizziness, weakness and headaches.  Hematological: Does not bruise/bleed easily.  Psychiatric/Behavioral: Negative.   All other systems reviewed and are negative.      Objective:   Physical Exam Vitals signs and nursing note reviewed.  Constitutional:      General: She is not in acute distress.    Appearance: Normal appearance. She is well-developed.  HENT:     Head: Normocephalic.     Nose: Nose normal.  Eyes:     Pupils: Pupils are equal, round, and reactive to light.  Neck:  Musculoskeletal: Normal range of motion and neck supple.     Vascular: No carotid bruit or JVD.  Cardiovascular:     Rate and Rhythm: Normal rate and regular rhythm.     Heart sounds: Normal heart sounds.  Pulmonary:     Effort: Pulmonary effort is normal. No respiratory distress.     Breath sounds: Normal breath sounds. No wheezing or rales.  Chest:     Chest wall: No tenderness.  Abdominal:     General: Bowel sounds are normal. There is no distension or abdominal bruit.     Palpations: Abdomen is soft. There is no hepatomegaly,  splenomegaly, mass or pulsatile mass.     Tenderness: There is no abdominal tenderness.  Musculoskeletal: Normal range of motion.  Lymphadenopathy:     Cervical: No cervical adenopathy.  Skin:    General: Skin is warm and dry.  Neurological:     Mental Status: She is alert and oriented to person, place, and time.     Deep Tendon Reflexes: Reflexes are normal and symmetric.  Psychiatric:        Behavior: Behavior normal.        Thought Content: Thought content normal.        Judgment: Judgment normal.    BP 102/68   Pulse 66   Temp 97.9 F (36.6 C) (Oral)   Ht 5' 9.75" (1.772 m)   Wt 206 lb (93.4 kg)   LMP 05/23/1995   BMI 29.77 kg/m      Assessment & Plan:  Casey [name] comes in today with chief complaint of Medical Management of Chronic Issues   Diagnosis and orders addressed:  1. Essential hypertension Low sodium diet - valsartan-hydrochlorothiazide (DIOVAN-HCT) 160-12.5 MG tablet; TAKE ONE (1) TABLET EACH DAY  Dispense: 90 tablet; Refill: 1 - metoprolol succinate (TOPROL-XL) 50 MG 24 hr tablet; TAKE ONE (1) TABLET EACH DAY  Dispense: 90 tablet; Refill: 1  2. Gastroesophageal reflux disease without esophagitis Avoid spicy foods Do not eat 2 hours prior to bedtime  3. Acquired hypothyroidism - Thyroid Panel With TSH  4. Pure hypercholesterolemia Low fat diet - Lipid panel - fenofibrate 160 MG tablet; TAKE ONE (1) TABLET EACH DAY  Dispense: 90 tablet; Refill: 1  5. Recurrent major depressive disorder, in full remission (Casey [name]) Stress management - escitalopram (LEXAPRO) 10 MG tablet; TAKE ONE (1) TABLET EACH DAY  Dispense: 90 tablet; Refill: 1  6. Graves' disease  7. Primary insomnia Bedtime routine - zolpidem (AMBIEN) 10 MG tablet; Take 1 tablet (10 mg total) by mouth at bedtime as needed. for sleep  Dispense: 30 tablet; Refill: 5   Labs pending Health Maintenance reviewed Diet and exercise encouraged  Follow up plan: 6 months   Casey  Hassell Done, FNP

## 2018-11-16 NOTE — Patient Instructions (Signed)

## 2018-11-17 LAB — LIPID PANEL
Chol/HDL Ratio: 2.4 ratio (ref 0.0–4.4)
Cholesterol, Total: 144 mg/dL (ref 100–199)
HDL: 59 mg/dL (ref >39)
LDL Calculated: 67 mg/dL (ref 0–99)
Triglycerides: 89 mg/dL (ref 0–149)
VLDL CHOLESTEROL CAL: 18 mg/dL (ref 5–40)

## 2018-11-17 LAB — THYROID PANEL WITH TSH
Free Thyroxine Index: 2.9 (ref 1.2–4.9)
T3 Uptake Ratio: 29 % (ref 24–39)
T4, Total: 10 ug/dL (ref 4.5–12.0)
TSH: 5.24 u[IU]/mL — ABNORMAL HIGH (ref 0.450–4.500)

## 2018-11-20 ENCOUNTER — Inpatient Hospital Stay (HOSPITAL_COMMUNITY): Payer: 59 | Admitting: Hematology

## 2018-11-20 ENCOUNTER — Other Ambulatory Visit: Payer: Self-pay

## 2018-11-20 ENCOUNTER — Ambulatory Visit (HOSPITAL_COMMUNITY): Payer: 59 | Admitting: Hematology

## 2018-11-20 ENCOUNTER — Encounter (HOSPITAL_COMMUNITY): Payer: Self-pay | Admitting: Hematology

## 2018-11-20 DIAGNOSIS — E876 Hypokalemia: Secondary | ICD-10-CM

## 2018-11-20 DIAGNOSIS — Z8589 Personal history of malignant neoplasm of other organs and systems: Secondary | ICD-10-CM

## 2018-11-20 DIAGNOSIS — R197 Diarrhea, unspecified: Secondary | ICD-10-CM | POA: Diagnosis not present

## 2018-11-20 DIAGNOSIS — Z79899 Other long term (current) drug therapy: Secondary | ICD-10-CM

## 2018-11-20 DIAGNOSIS — D373 Neoplasm of uncertain behavior of appendix: Secondary | ICD-10-CM

## 2018-11-20 DIAGNOSIS — I10 Essential (primary) hypertension: Secondary | ICD-10-CM

## 2018-11-20 DIAGNOSIS — Z905 Acquired absence of kidney: Secondary | ICD-10-CM | POA: Diagnosis not present

## 2018-11-20 NOTE — Assessment & Plan Note (Signed)
1.  Low-grade mucinous adenocarcinoma of the appendix: -Status post right hemicolectomy at [facility] on 02/02/2016. -Status post CRS (greater and lesser omentectomy, bilateral salpingo-nephrectomy, peritoneal pelvic stripping and resection of diaphragmatic nodules)/HIPEC on 03/29/2016 by Dr. Johney Maine at [place] scan of the abdomen and pelvis on 09/02/2017 showed postsurgical changes with hepatomegaly and steatosis. - She had a repeat CT CAP on 09/15/2018.  I reviewed the results.  It showed post surgical changes, no evidence of recurrence.  Hepatomegaly and steatosis was seen. -Physical examination did not reveal any suspicious masses. -She will come back in 6 months with repeat blood work and physical exam.  2.  Chronic diarrhea: -She had diarrhea about 6-10 watery stools per day.  She tried taking Imodium which gave her abdominal cramping.  She tried [employer] which did not help.  I gave her Questran, she could not tolerate the texture. - Lately she has more texture to the stools.  3.  Hypokalemia: -Potassium was 3.3.  This has been low for the last several times. -I have recommended her to start back on potassium tablets.

## 2018-11-20 NOTE — Progress Notes (Signed)
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   CURRENT THERAPY: Observation   INTERVAL HISTORY:  Casey [name] 57 y.o. 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 She still has watery diarrhea daily. She occasionally has nausea but it doesn't last long. Denies any vomiting, or constipation. Denies any new pains. Had not noticed any recent bleeding such as epistaxis, hematuria or hematochezia. Denies recent chest pain on exertion, shortness of breath on minimal exertion, pre-syncopal episodes, or palpitations. Denies any numbness or tingling in hands or feet. Denies any recent fevers, infections, or recent hospitalizations. She reports her appetite and energy level at 100%.     REVIEW OF SYSTEMS:  Review of Systems  Gastrointestinal: Positive for diarrhea and nausea.  Psychiatric/Behavioral: Positive for sleep disturbance.  All other systems reviewed and are negative.    PAST MEDICAL/SURGICAL HISTORY:  Past Medical History:  Diagnosis Date  . Abnormal Pap smear of cervix    1985  . Anemia   . Anxiety   . Cancer ([place])    premelanoma per hip   . Cancer of appendix ([place])   . Depression   . DUB (dysfunctional uterine bleeding)   . Dysrhythmia    pt states has PVCs   . Gallstones   . GERD (gastroesophageal reflux disease)   . Graves disease   . History of blood transfusion   . History of urinary tract infection    last one approx 4 years ago   . Hyperlipidemia   . Hypertension   . 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 Migraines   . Ovarian cyst    both ovaries  . Palpitations   . Pneumonia    hx of had approx 22 years ago   . PONV (postoperative nausea and vomiting)   . Pulmonary  nodule    Past Surgical History:  Procedure Laterality Date  . APPENDECTOMY    . CRYOTHERAPY     for abnormal pap  . DIAGNOSTIC LAPAROSCOPY    . DILATION AND CURETTAGE OF UTERUS    . infertility testing     . LAPAROSCOPIC PARTIAL COLECTOMY N/A 02/02/2016   Procedure: LAPAROSCOPIC PARTIAL COlectomy right colon;  Surgeon: Greer Pickerel, MD;  Location: [facility];  Service: General;  Laterality: N/A;  . OOPHORECTOMY     & fallopian tubes  . TONSILLECTOMY    . VAGINAL HYSTERECTOMY     total  . VESICOVAGINAL FISTULA CLOSURE W/ TAH     Dr. Margaretha Glassing  2 degree DUB      SOCIAL HISTORY:  Social History   Socioeconomic History  . Marital status: Married    Spouse name: Not on file  . Number of children: Not on file  . Years of education: Not on file  . Highest education level: Not on file  Occupational History  . Not on file  Social Needs  . Financial resource strain: Not on file  . Food insecurity:    Worry: Not on file    Inability: Not on file  . Transportation needs:    Medical: Not on file    Non-medical: Not on file  Tobacco Use  . Smoking status: Never Smoker  . Smokeless tobacco: Never Used  Substance and Sexual  Activity  . Alcohol use: No  . Drug use: No  . Sexual activity: Yes    Partners: Male    Birth control/protection: Surgical    Comment: [facility]  Lifestyle  . Physical activity:    Days per week: Not on file    Minutes per session: Not on file  . Stress: Not on file  Relationships  . Social connections:    Talks on phone: Not on file    Gets together: Not on file    Attends religious service: Not on file    Active member of club or organization: Not on file    Attends meetings of clubs or organizations: Not on file    Relationship status: Not on file  . Intimate partner violence:    Fear of current or ex partner: Not on file    Emotionally abused: Not on file    Physically abused: Not on file    Forced sexual activity: Not on file  Other Topics Concern    . Not on file  Social History Narrative  . Not on file    FAMILY HISTORY:  Family History  Problem Relation Age of Onset  . Hypertension Mother   . Irregular heart beat Mother   . Thyroid disease Maternal Grandmother   . Cancer Maternal Grandmother        lung  . Cancer Paternal Grandfather        lung    CURRENT MEDICATIONS:  Outpatient Encounter Medications as of 11/20/2018  Medication Sig  . ALL DAY ALLERGY 10 MG tablet TAKE ONE (1) TABLET EACH DAY  . escitalopram (LEXAPRO) 10 MG tablet TAKE ONE (1) TABLET EACH DAY  . fenofibrate 160 MG tablet TAKE ONE (1) TABLET EACH DAY  . folic acid (FOLVITE) 1 MG tablet TAKE ONE (1) TABLET EACH DAY  . levothyroxine (SYNTHROID) 112 MCG tablet Take 1 tablet (112 mcg total) by mouth daily.  . metoprolol succinate (TOPROL-XL) 50 MG 24 hr tablet TAKE ONE (1) TABLET EACH DAY  . pantoprazole (PROTONIX) 40 MG tablet Take 1 tablet (40 mg total) by mouth as needed.  . promethazine (PHENERGAN) 25 MG tablet TAKE 1 TABLET EVERY 6 HOURS AS NEEDED FOR NAUSEA AND VOMITING  . valsartan-hydrochlorothiazide (DIOVAN-HCT) 160-12.5 MG tablet TAKE ONE (1) TABLET EACH DAY  . zolpidem (AMBIEN) 10 MG tablet Take 1 tablet (10 mg total) by mouth at bedtime as needed. for sleep  . [DISCONTINUED] scopolamine (TRANSDERM-SCOP, 1.5 MG,) 1 MG/3DAYS Place 1 patch (1.5 mg total) onto the skin every 3 (three) days.   No facility-administered encounter medications on file as of 11/20/2018.     ALLERGIES:  Allergies  Allergen Reactions  . Adhesive  [Tape] Itching    BANDAIDS  . Buprenorphine Hcl Itching  . Labetalol Itching  . Morphine And Related Itching  . Other     [place] seafood seasoning   . Zosyn [Piperacillin Sod-Tazobactam So] Rash    Pt states has had dose since and did not have difficulty, rash only on first dose , not in doses following      PHYSICAL EXAM:  ECOG Performance status: 1  Vitals:   11/20/18 0832  BP: 117/66  Pulse: (!) 59  Resp: 16   Temp: 98 F (36.7 C)  SpO2: 100%   Filed Weights   11/20/18 0832  Weight: 206 lb 6.4 oz (93.6 kg)    Physical Exam Constitutional:      Appearance: Normal appearance. She is normal weight.  Musculoskeletal: Normal range of motion.  Skin:    General: Skin is warm and dry.  Neurological:     Mental Status: She is alert and oriented to person, place, and time. Mental status is at baseline.  Psychiatric:        Mood and Affect: Mood normal.        Behavior: Behavior normal.        Thought Content: Thought content normal.        Judgment: Judgment normal.    Abdomen: No palpable masses or hepatosplenomegaly. No palpable lymphadenopathy.  LABORATORY DATA:  I have reviewed the labs as listed.  CBC    Component Value Date/Time   WBC 7.1 11/13/2018 1031   RBC 4.28 11/13/2018 1031   HGB 12.2 11/13/2018 1031   HGB 12.0 05/16/2018 1511   HCT 38.6 11/13/2018 1031   HCT 35.0 05/16/2018 1511   PLT 233 11/13/2018 1031   PLT 235 05/16/2018 1511   MCV 90.2 11/13/2018 1031   MCV 84 05/16/2018 1511   MCH 28.5 11/13/2018 1031   MCHC 31.6 11/13/2018 1031   RDW 12.8 11/13/2018 1031   RDW 13.9 05/16/2018 1511   LYMPHSABS 2.4 11/13/2018 1031   LYMPHSABS 2.6 05/16/2018 1511   MONOABS 0.6 11/13/2018 1031   EOSABS 0.2 11/13/2018 1031   EOSABS 0.2 05/16/2018 1511   BASOSABS 0.1 11/13/2018 1031   BASOSABS 0.1 05/16/2018 1511   CMP Latest Ref Rng & Units 11/13/2018 05/16/2018 09/27/2017  Glucose 70 - 99 mg/dL 95 92 93  BUN 6 - 20 mg/dL 20 22 22   Creatinine 0.44 - 1.00 mg/dL 1.16(H) 1.27(H) 1.13(H)  Sodium 135 - 145 mmol/L 142 145(H) 146(H)  Potassium 3.5 - 5.1 mmol/L 3.3(L) 3.4(L) 3.1(L)  Chloride 98 - 111 mmol/L 107 104 107(H)  CO2 22 - 32 mmol/L 28 28 23   Calcium 8.9 - 10.3 mg/dL 9.5 9.6 9.2  Total Protein 6.5 - 8.1 g/dL 7.0 6.8 6.4  Total Bilirubin 0.3 - 1.2 mg/dL 0.5 <0.2 0.2  Alkaline Phos 38 - 126 U/L 33(L) 47 47  AST 15 - 41 U/L 18 19 19   ALT 0 - 44 U/L 20 14 14         DIAGNOSTIC IMAGING:  I have independently reviewed the scans and discussed with the patient.   I have reviewed Francene Finders, NP's note and agree with the documentation.  I personally performed a face-to-face visit, made revisions and my assessment and plan is as follows.    ASSESSMENT & PLAN:   Low grade mucinous neoplasm of appendix 1.  Low-grade mucinous adenocarcinoma of the appendix: -Status post right hemicolectomy at [facility] on 02/02/2016. -Status post CRS (greater and lesser omentectomy, bilateral salpingo-nephrectomy, peritoneal pelvic stripping and resection of diaphragmatic nodules)/HIPEC on 03/29/2016 by Dr. Johney Maine at [facility] scan of the abdomen and pelvis on 09/02/2017 showed postsurgical changes with hepatomegaly and steatosis. - She had a repeat CT CAP on 09/15/2018.  I reviewed the results.  It showed post surgical changes, no evidence of recurrence.  Hepatomegaly and steatosis was seen. -Physical examination did not reveal any suspicious masses. -She will come back in 6 months with repeat blood work and physical exam.  2.  Chronic diarrhea: -She had diarrhea about 6-10 watery stools per day.  She tried taking Imodium which gave her abdominal cramping.  She tried [employer] which did not help.  I gave her Questran, she could not tolerate the texture. - Lately she has more texture  to the stools.  3.  Hypokalemia: -Potassium was 3.3.  This has been low for the last several times. -I have recommended her to start back on potassium tablets.      Orders placed this encounter:  Orders Placed This Encounter  Procedures  . CEA  . CBC with Differential/Platelet  . Comprehensive metabolic panel  . Lactate dehydrogenase      Derek Jack, MD [place] [phone]

## 2018-11-20 NOTE — Patient Instructions (Addendum)
[facility] at [facility] Discharge Instructions  Follow up in 6 months with labs    Thank you for choosing [facility] at [facility] to provide your oncology and hematology care.  To afford each patient quality time with our provider, please arrive at least 15 minutes before your scheduled appointment time.   If you have a lab appointment with the Cancer Center please come in thru the  Main Entrance and check in at the main information desk  You need to re-schedule your appointment should you arrive 10 or more minutes late.  We strive to give you quality time with our providers, and arriving late affects you and other patients whose appointments are after yours.  Also, if you no show three or more times for appointments you may be dismissed from the clinic at the providers discretion.     Again, thank you for choosing [facility].  Our hope is that these requests will decrease the amount of time that you wait before being seen by our physicians.       _____________________________________________________________  Should you have questions after your visit to [facility], please contact our office at [phone] between the hours of 8:00 a.m. and 4:30 p.m.  Voicemails left after 4:00 p.m. will not be returned until the following business day.  For prescription refill requests, have your pharmacy contact our office and allow 72 hours.    Cancer Center Support Programs:   > Cancer Support Group  2nd Tuesday of the month 1pm-2pm, Journey Room    

## 2018-11-21 ENCOUNTER — Other Ambulatory Visit (HOSPITAL_COMMUNITY): Payer: Self-pay | Admitting: Emergency Medicine

## 2018-11-21 MED ORDER — FOLIC ACID 1 MG PO TABS: 1.0000 mg | ORAL_TABLET | Freq: Every day | ORAL | 3 refills | Status: DC

## 2018-11-23 MED ORDER — LEVOTHYROXINE SODIUM 125 MCG PO TABS: 125.0000 ug | ORAL_TABLET | Freq: Every day | ORAL | 1 refills | Status: DC

## 2018-11-23 NOTE — Addendum Note (Signed)
Addended by: Chevis Pretty on: 11/23/2018 09:22 AM   Modules accepted: Orders

## 2018-12-18 ENCOUNTER — Other Ambulatory Visit: Payer: Self-pay | Admitting: Nurse Practitioner

## 2018-12-28 ENCOUNTER — Other Ambulatory Visit: Payer: Self-pay | Admitting: Nurse Practitioner

## 2019-05-19 ENCOUNTER — Other Ambulatory Visit: Payer: Self-pay | Admitting: Nurse Practitioner

## 2019-05-19 DIAGNOSIS — F5101 Primary insomnia: Secondary | ICD-10-CM

## 2019-05-22 ENCOUNTER — Other Ambulatory Visit (HOSPITAL_COMMUNITY): Payer: 59

## 2019-05-22 ENCOUNTER — Ambulatory Visit (HOSPITAL_COMMUNITY): Payer: 59 | Admitting: Hematology

## 2019-05-29 ENCOUNTER — Ambulatory Visit: Payer: 59 | Admitting: Certified Nurse Midwife

## 2019-06-07 ENCOUNTER — Ambulatory Visit (HOSPITAL_COMMUNITY): Payer: 59 | Admitting: Hematology

## 2019-06-07 ENCOUNTER — Other Ambulatory Visit (HOSPITAL_COMMUNITY): Payer: 59

## 2019-06-15 ENCOUNTER — Other Ambulatory Visit: Payer: Self-pay | Admitting: Nurse Practitioner

## 2019-06-15 DIAGNOSIS — F5101 Primary insomnia: Secondary | ICD-10-CM

## 2019-06-26 ENCOUNTER — Other Ambulatory Visit: Payer: Self-pay | Admitting: Nurse Practitioner

## 2019-06-26 DIAGNOSIS — I10 Essential (primary) hypertension: Secondary | ICD-10-CM

## 2019-06-26 DIAGNOSIS — E7800 Pure hypercholesterolemia, unspecified: Secondary | ICD-10-CM

## 2019-07-16 ENCOUNTER — Other Ambulatory Visit: Payer: Self-pay | Admitting: Nurse Practitioner

## 2019-07-17 ENCOUNTER — Other Ambulatory Visit: Payer: Self-pay | Admitting: Nurse Practitioner

## 2019-07-17 DIAGNOSIS — F5101 Primary insomnia: Secondary | ICD-10-CM

## 2019-07-19 ENCOUNTER — Encounter: Payer: Self-pay | Admitting: Nurse Practitioner

## 2019-07-19 ENCOUNTER — Ambulatory Visit (INDEPENDENT_AMBULATORY_CARE_PROVIDER_SITE_OTHER): Payer: 59 | Admitting: Nurse Practitioner

## 2019-07-19 DIAGNOSIS — E039 Hypothyroidism, unspecified: Secondary | ICD-10-CM | POA: Diagnosis not present

## 2019-07-19 DIAGNOSIS — I10 Essential (primary) hypertension: Secondary | ICD-10-CM

## 2019-07-19 DIAGNOSIS — K219 Gastro-esophageal reflux disease without esophagitis: Secondary | ICD-10-CM | POA: Diagnosis not present

## 2019-07-19 DIAGNOSIS — E7800 Pure hypercholesterolemia, unspecified: Secondary | ICD-10-CM | POA: Diagnosis not present

## 2019-07-19 DIAGNOSIS — K529 Noninfective gastroenteritis and colitis, unspecified: Secondary | ICD-10-CM

## 2019-07-19 DIAGNOSIS — D373 Neoplasm of uncertain behavior of appendix: Secondary | ICD-10-CM

## 2019-07-19 DIAGNOSIS — F3342 Major depressive disorder, recurrent, in full remission: Secondary | ICD-10-CM

## 2019-07-19 DIAGNOSIS — F5101 Primary insomnia: Secondary | ICD-10-CM

## 2019-07-19 MED ORDER — PANTOPRAZOLE SODIUM 40 MG PO TBEC: 40.0000 mg | DELAYED_RELEASE_TABLET | ORAL | 1 refills | Status: DC | PRN

## 2019-07-19 MED ORDER — ESCITALOPRAM OXALATE 10 MG PO TABS: ORAL_TABLET | ORAL | 1 refills | Status: DC

## 2019-07-19 MED ORDER — METOPROLOL SUCCINATE ER 50 MG PO TB24: ORAL_TABLET | ORAL | 1 refills | Status: DC

## 2019-07-19 MED ORDER — ZOLPIDEM TARTRATE 10 MG PO TABS: 10.0000 mg | ORAL_TABLET | Freq: Every evening | ORAL | 3 refills | Status: DC | PRN

## 2019-07-19 MED ORDER — LEVOTHYROXINE SODIUM 125 MCG PO TABS: 125.0000 ug | ORAL_TABLET | Freq: Every day | ORAL | 1 refills | Status: DC

## 2019-07-19 MED ORDER — DIPHENOXYLATE-ATROPINE 2.5-0.025 MG PO TABS: 1.0000 | ORAL_TABLET | Freq: Four times a day (QID) | ORAL | 1 refills | Status: DC | PRN

## 2019-07-19 MED ORDER — VALSARTAN-HYDROCHLOROTHIAZIDE 160-12.5 MG PO TABS: ORAL_TABLET | ORAL | 1 refills | Status: DC

## 2019-07-19 MED ORDER — FENOFIBRATE 160 MG PO TABS: ORAL_TABLET | ORAL | 1 refills | Status: DC

## 2019-07-19 NOTE — Progress Notes (Signed)
Virtual Visit via telephone Note Due to COVID-19 pandemic this visit was conducted virtually. This visit type was conducted due to national recommendations for restrictions regarding the COVID-19 Pandemic (e.g. social distancing, sheltering in place) in an effort to limit this patient's exposure and mitigate transmission in our community. All issues noted in this document were discussed and addressed.  A physical exam was not performed with this format.  I connected with Casey [name] on 07/19/19 at 3:15 by telephone and verified that I am speaking with the correct person using two identifiers. Casey [name] is currently located at home and no one  is currently with her during visit. The provider, [name] Hassell Done, FNP is located in their office at time of visit.  I discussed the limitations, risks, security and privacy concerns of performing an evaluation and management service by telephone and the availability of in person appointments. I also discussed with the patient that there may be a patient responsible charge related to this service. The patient expressed understanding and agreed to proceed.   History and Present Illness:   Chief Complaint: Medical Management of Chronic Issues    HPI:  1. Essential hypertension No c/o chest pain, sob or headaceh. Does not check bloodpressure at h ome. BP Readings from Last 3 Encounters:  11/20/18 117/66  11/16/18 102/68  05/24/18 110/64     2. Pure hypercholesterolemia Does watch diet and does daily exercise. Lab Results  Component Value Date   CHOL 144 11/16/2018   HDL 59 11/16/2018   LDLCALC 67 11/16/2018   TRIG 89 11/16/2018   CHOLHDL 2.4 11/16/2018     3. Acquired hypothyroidism No problems that aware of.  4. Gastroesophageal reflux disease without esophagitis Takes protonix daily and usually works well for her.  5. Low grade mucinous neoplasm of appendix She finished all of her treatments and her last scan showed  no sign of cancer. She is still having frequent bowel movements since her treatment. Goes 8-10 times a day.  6. Recurrent major depressive disorder, in full remission ([place]) Is on lexapro and is doing well.  7. Insomnia Is on ambien and is not able to sleep without it.   Outpatient Encounter Medications as of 07/19/2019  Medication Sig  . cetirizine (ZYRTEC) 10 MG tablet TAKE ONE (1) TABLET EACH DAY  . escitalopram (LEXAPRO) 10 MG tablet TAKE ONE (1) TABLET EACH DAY  . fenofibrate 160 MG tablet TAKE ONE (1) TABLET EACH DAY (Please make 6 mos ckup)  . folic acid (FOLVITE) 1 MG tablet Take 1 tablet (1 mg total) by mouth daily.  Marland Kitchen levothyroxine (SYNTHROID) 125 MCG tablet Take 1 tablet (125 mcg total) by mouth daily before breakfast. (Need repeat labs)  . metoprolol succinate (TOPROL-XL) 50 MG 24 hr tablet TAKE ONE (1) TABLET EACH DAY (Please make 6 mos ckup)  . pantoprazole (PROTONIX) 40 MG tablet Take 1 tablet (40 mg total) by mouth as needed.  . promethazine (PHENERGAN) 25 MG tablet TAKE 1 TABLET EVERY 6 HOURS AS NEEDED FOR NAUSEA AND VOMITING  . valsartan-hydrochlorothiazide (DIOVAN-HCT) 160-12.5 MG tablet TAKE ONE (1) TABLET EACH DAY (Please make 6 mos ckup)  . zolpidem (AMBIEN) 10 MG tablet TAKE 1 TABLET AT BEDTIME AS NEEDED FOR SLEEP     Past Surgical History:  Procedure Laterality Date  . APPENDECTOMY    . CRYOTHERAPY     for abnormal pap  . DIAGNOSTIC LAPAROSCOPY    . DILATION AND CURETTAGE OF UTERUS    .  infertility testing     . LAPAROSCOPIC PARTIAL COLECTOMY N/A 02/02/2016   Procedure: LAPAROSCOPIC PARTIAL COlectomy right colon;  Surgeon: Greer Pickerel, MD;  Location: [facility];  Service: General;  Laterality: N/A;  . OOPHORECTOMY     & fallopian tubes  . TONSILLECTOMY    . VAGINAL HYSTERECTOMY     total  . VESICOVAGINAL FISTULA CLOSURE W/ TAH     Dr. Margaretha Glassing  2 degree DUB     Family History  Problem Relation Age of Onset  . Hypertension Mother   . Irregular heart  beat Mother   . Thyroid disease Maternal Grandmother   . Cancer Maternal Grandmother        lung  . Cancer Paternal Grandfather        lung    New complaints: -Having low back and hip  pain. If she bends over she can hardly get back up. rates pain8-9/10. She has bene taking ibuprofen and without that she could not get through the day.   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    ROS   Observations/Objective: Alert and oriented- answers all questions appropriately No distress  Assessment and Plan: Casey [name] comes in today with chief complaint of Medical Management of Chronic Issues   Diagnosis and orders addressed:  1. Essential hypertension Low sodium diet - valsartan-hydrochlorothiazide (DIOVAN-HCT) 160-12.5 MG tablet; TAKE ONE (1) TABLET EACH DAY (Please make 6 mos ckup)  Dispense: 90 tablet; Refill: 1 - metoprolol succinate (TOPROL-XL) 50 MG 24 hr tablet; TAKE ONE (1) TABLET EACH DAY (Please make 6 mos ckup)  Dispense: 90 tablet; Refill: 1 - CMP14+EGFR; Future  2. Pure hypercholesterolemia Low fat diet - fenofibrate 160 MG tablet; TAKE ONE (1) TABLET EACH DAY (Please make 6 mos ckup)  Dispense: 90 tablet; Refill: 1 - Lipid panel; Future  3. Acquired hypothyroidism - Thyroid Panel With TSH; Future  4. Gastroesophageal reflux disease without esophagitis Avoid spicy foods Do not eat 2 hours prior to bedtime   5. Low grade mucinous neoplasm of appendix - CBC with Differential/Platelet; Future  6. Recurrent major depressive disorder, in full remission ([place]) Stress management - escitalopram (LEXAPRO) 10 MG tablet; TAKE ONE (1) TABLET EACH DAY  Dispense: 90 tablet; Refill: 1  7. Chronic diarrhea Will try lomotil and see if will help with diarrhea - diphenoxylate-atropine (LOMOTIL) 2.5-0.025 MG tablet; Take 1 tablet by mouth 4 (four) times daily as needed for diarrhea or loose stools.   Dispense: 30 tablet; Refill: 1  8. Primary insomnia Bedtime routine - zolpidem (AMBIEN) 10 MG tablet; Take 1 tablet (10 mg total) by mouth at bedtime as needed. for sleep  Dispense: 30 tablet; Refill: 3   Patient will come in and have  Labs drawn Health Maintenance reviewed Diet and exercise encouraged  Follow up plan: 6 months      I discussed the assessment and treatment plan with the patient. The patient was provided an opportunity to ask questions and all were answered. The patient agreed with the plan and demonstrated an understanding of the instructions.   The patient was advised to call back or seek an in-person evaluation if the symptoms worsen or if the condition fails to improve as anticipated.  The above assessment and management plan was discussed with the patient. The patient verbalized understanding of and has agreed to the management plan. Patient is aware to call the clinic if symptoms  persist or worsen. Patient is aware when to return to the clinic for a follow-up visit. Patient educated on when it is appropriate to go to the emergency department.   Time call ended:  3:30  I provided 15 minutes of non-face-to-face time during this encounter.    [name] Hassell Done, FNP

## 2019-07-26 ENCOUNTER — Other Ambulatory Visit: Payer: Self-pay | Admitting: Nurse Practitioner

## 2019-07-26 ENCOUNTER — Other Ambulatory Visit: Payer: 59

## 2019-07-26 ENCOUNTER — Other Ambulatory Visit: Payer: Self-pay

## 2019-07-26 DIAGNOSIS — E7800 Pure hypercholesterolemia, unspecified: Secondary | ICD-10-CM

## 2019-07-26 DIAGNOSIS — E039 Hypothyroidism, unspecified: Secondary | ICD-10-CM

## 2019-07-26 DIAGNOSIS — R739 Hyperglycemia, unspecified: Secondary | ICD-10-CM

## 2019-07-26 DIAGNOSIS — I10 Essential (primary) hypertension: Secondary | ICD-10-CM

## 2019-07-26 DIAGNOSIS — D373 Neoplasm of uncertain behavior of appendix: Secondary | ICD-10-CM

## 2019-07-26 MED ORDER — PREDNISONE 10 MG (21) PO TBPK: ORAL_TABLET | ORAL | 0 refills | Status: DC

## 2019-07-27 LAB — CBC WITH DIFFERENTIAL/PLATELET
Basophils Absolute: 0.1 10*3/uL (ref 0.0–0.2)
Basos: 1 %
EOS (ABSOLUTE): 0.3 10*3/uL (ref 0.0–0.4)
Eos: 3 %
Hematocrit: 37 % (ref 34.0–46.6)
Hemoglobin: 12.1 g/dL (ref 11.1–15.9)
Immature Grans (Abs): 0 10*3/uL (ref 0.0–0.1)
Immature Granulocytes: 0 %
Lymphocytes Absolute: 2.6 10*3/uL (ref 0.7–3.1)
Lymphs: 30 %
MCH: 29.7 pg (ref 26.6–33.0)
MCHC: 32.7 g/dL (ref 31.5–35.7)
MCV: 91 fL (ref 79–97)
Monocytes Absolute: 0.8 10*3/uL (ref 0.1–0.9)
Monocytes: 9 %
Neutrophils Absolute: 4.9 10*3/uL (ref 1.4–7.0)
Neutrophils: 57 %
Platelets: 228 10*3/uL (ref 150–450)
RBC: 4.07 x10E6/uL (ref 3.77–5.28)
RDW: 12.3 % (ref 11.7–15.4)
WBC: 8.7 10*3/uL (ref 3.4–10.8)

## 2019-07-27 LAB — CMP14+EGFR
ALT: 23 IU/L (ref 0–32)
AST: 20 IU/L (ref 0–40)
Albumin/Globulin Ratio: 1.8 (ref 1.2–2.2)
Albumin: 4.4 g/dL (ref 3.8–4.9)
Alkaline Phosphatase: 55 IU/L (ref 39–117)
BUN/Creatinine Ratio: 18 (ref 9–23)
BUN: 26 mg/dL — ABNORMAL HIGH (ref 6–24)
Bilirubin Total: <0.2 mg/dL (ref 0.0–1.2)
CO2: 25 mmol/L (ref 20–29)
Calcium: 9.6 mg/dL (ref 8.7–10.2)
Chloride: 101 mmol/L (ref 96–106)
Creatinine, Ser: 1.46 mg/dL — ABNORMAL HIGH (ref 0.57–1.00)
GFR calc Af Amer: 46 mL/min/{1.73_m2} — ABNORMAL LOW (ref >59)
GFR calc non Af Amer: 40 mL/min/{1.73_m2} — ABNORMAL LOW (ref >59)
Globulin, Total: 2.4 g/dL (ref 1.5–4.5)
Glucose: 114 mg/dL — ABNORMAL HIGH (ref 65–99)
Potassium: 3.5 mmol/L (ref 3.5–5.2)
Sodium: 142 mmol/L (ref 134–144)
Total Protein: 6.8 g/dL (ref 6.0–8.5)

## 2019-07-27 LAB — LIPID PANEL
Chol/HDL Ratio: 3.1 ratio (ref 0.0–4.4)
Cholesterol, Total: 151 mg/dL (ref 100–199)
HDL: 49 mg/dL (ref >39)
LDL Chol Calc (NIH): 69 mg/dL (ref 0–99)
Triglycerides: 198 mg/dL — ABNORMAL HIGH (ref 0–149)
VLDL Cholesterol Cal: 33 mg/dL (ref 5–40)

## 2019-07-27 LAB — THYROID PANEL WITH TSH
Free Thyroxine Index: 3 (ref 1.2–4.9)
T3 Uptake Ratio: 30 % (ref 24–39)
T4, Total: 9.9 ug/dL (ref 4.5–12.0)
TSH: 1.53 u[IU]/mL (ref 0.450–4.500)

## 2019-08-01 LAB — SPECIMEN STATUS REPORT

## 2019-08-01 LAB — HGB A1C W/O EAG: Hgb A1c MFr Bld: 5.7 % — ABNORMAL HIGH (ref 4.8–5.6)

## 2019-09-25 ENCOUNTER — Ambulatory Visit: Payer: 59

## 2019-10-01 ENCOUNTER — Ambulatory Visit: Payer: 59

## 2019-12-10 ENCOUNTER — Other Ambulatory Visit: Payer: Self-pay | Admitting: Nurse Practitioner

## 2019-12-10 DIAGNOSIS — F5101 Primary insomnia: Secondary | ICD-10-CM

## 2020-01-08 ENCOUNTER — Other Ambulatory Visit: Payer: Self-pay | Admitting: Nurse Practitioner

## 2020-01-08 DIAGNOSIS — K529 Noninfective gastroenteritis and colitis, unspecified: Secondary | ICD-10-CM

## 2020-02-11 ENCOUNTER — Encounter: Payer: Self-pay | Admitting: Certified Nurse Midwife

## 2020-02-16 ENCOUNTER — Other Ambulatory Visit: Payer: Self-pay | Admitting: Nurse Practitioner

## 2020-02-16 DIAGNOSIS — I10 Essential (primary) hypertension: Secondary | ICD-10-CM

## 2020-02-16 DIAGNOSIS — E7800 Pure hypercholesterolemia, unspecified: Secondary | ICD-10-CM

## 2020-03-10 ENCOUNTER — Other Ambulatory Visit: Payer: Self-pay | Admitting: Nurse Practitioner

## 2020-03-10 DIAGNOSIS — F5101 Primary insomnia: Secondary | ICD-10-CM

## 2020-03-24 ENCOUNTER — Other Ambulatory Visit: Payer: Self-pay | Admitting: Nurse Practitioner

## 2020-03-24 DIAGNOSIS — I10 Essential (primary) hypertension: Secondary | ICD-10-CM

## 2020-03-24 DIAGNOSIS — K529 Noninfective gastroenteritis and colitis, unspecified: Secondary | ICD-10-CM

## 2020-03-24 DIAGNOSIS — E7800 Pure hypercholesterolemia, unspecified: Secondary | ICD-10-CM

## 2020-04-02 ENCOUNTER — Other Ambulatory Visit: Payer: Self-pay | Admitting: Nurse Practitioner

## 2020-04-07 ENCOUNTER — Other Ambulatory Visit: Payer: Self-pay | Admitting: Nurse Practitioner

## 2020-04-07 DIAGNOSIS — F3342 Major depressive disorder, recurrent, in full remission: Secondary | ICD-10-CM

## 2020-04-07 DIAGNOSIS — F5101 Primary insomnia: Secondary | ICD-10-CM

## 2020-04-11 ENCOUNTER — Ambulatory Visit (INDEPENDENT_AMBULATORY_CARE_PROVIDER_SITE_OTHER): Payer: 59 | Admitting: Nurse Practitioner

## 2020-04-11 ENCOUNTER — Encounter: Payer: Self-pay | Admitting: Nurse Practitioner

## 2020-04-11 ENCOUNTER — Other Ambulatory Visit: Payer: Self-pay

## 2020-04-11 DIAGNOSIS — E039 Hypothyroidism, unspecified: Secondary | ICD-10-CM

## 2020-04-11 DIAGNOSIS — E7800 Pure hypercholesterolemia, unspecified: Secondary | ICD-10-CM | POA: Diagnosis not present

## 2020-04-11 DIAGNOSIS — I10 Essential (primary) hypertension: Secondary | ICD-10-CM | POA: Diagnosis not present

## 2020-04-11 DIAGNOSIS — K219 Gastro-esophageal reflux disease without esophagitis: Secondary | ICD-10-CM | POA: Diagnosis not present

## 2020-04-11 DIAGNOSIS — K3533 Acute appendicitis with perforation and localized peritonitis, with abscess: Secondary | ICD-10-CM

## 2020-04-11 DIAGNOSIS — K529 Noninfective gastroenteritis and colitis, unspecified: Secondary | ICD-10-CM

## 2020-04-11 DIAGNOSIS — Z6829 Body mass index (BMI) 29.0-29.9, adult: Secondary | ICD-10-CM | POA: Insufficient documentation

## 2020-04-11 DIAGNOSIS — F5101 Primary insomnia: Secondary | ICD-10-CM

## 2020-04-11 DIAGNOSIS — F3342 Major depressive disorder, recurrent, in full remission: Secondary | ICD-10-CM

## 2020-04-11 DIAGNOSIS — R5383 Other fatigue: Secondary | ICD-10-CM

## 2020-04-11 MED ORDER — PANTOPRAZOLE SODIUM 40 MG PO TBEC: 40.0000 mg | DELAYED_RELEASE_TABLET | Freq: Every day | ORAL | 1 refills | Status: DC | PRN

## 2020-04-11 MED ORDER — VALSARTAN-HYDROCHLOROTHIAZIDE 160-12.5 MG PO TABS: ORAL_TABLET | ORAL | 1 refills | Status: DC

## 2020-04-11 MED ORDER — ESCITALOPRAM OXALATE 10 MG PO TABS: ORAL_TABLET | ORAL | 1 refills | Status: DC

## 2020-04-11 MED ORDER — LEVOTHYROXINE SODIUM 125 MCG PO TABS: 125.0000 ug | ORAL_TABLET | Freq: Every day | ORAL | 1 refills | Status: DC

## 2020-04-11 MED ORDER — METOPROLOL SUCCINATE ER 50 MG PO TB24: ORAL_TABLET | ORAL | 1 refills | Status: DC

## 2020-04-11 MED ORDER — FENOFIBRATE 160 MG PO TABS: ORAL_TABLET | ORAL | 1 refills | Status: DC

## 2020-04-11 MED ORDER — DIPHENOXYLATE-ATROPINE 2.5-0.025 MG PO TABS: 2.0000 | ORAL_TABLET | Freq: Four times a day (QID) | ORAL | 2 refills | Status: DC

## 2020-04-11 MED ORDER — ZOLPIDEM TARTRATE 10 MG PO TABS: 10.0000 mg | ORAL_TABLET | Freq: Every evening | ORAL | 5 refills | Status: DC | PRN

## 2020-04-11 NOTE — Progress Notes (Signed)
Virtual Visit via telephone Note Due to COVID-19 pandemic this visit was conducted virtually. This visit type was conducted due to national recommendations for restrictions regarding the COVID-19 Pandemic (e.g. social distancing, sheltering in place) in an effort to limit this patient's exposure and mitigate transmission in our community. All issues noted in this document were discussed and addressed.  A physical exam was not performed with this format.  I connected with Casey [name] on 04/11/20 at 11:10 by telephone and verified that I am speaking with the correct person using two identifiers. Casey [name] is currently located at home and no one is currently with her during visit. The provider, [name] Hassell Done, FNP is located in their office at time of visit.  I discussed the limitations, risks, security and privacy concerns of performing an evaluation and management service by telephone and the availability of in person appointments. I also discussed with the patient that there may be a patient responsible charge related to this service. The patient expressed understanding and agreed to proceed.   History and Present Illness:   Chief Complaint: Medical Management of Chronic Issues    HPI:  1. Essential hypertension No c/o chest pain, sob or headache. Doe not check blood pressure at home. BP Readings from Last 3 Encounters:  11/20/18 117/66  11/16/18 102/68  05/24/18 110/64     2. Acquired hypothyroidism No problem that aware of. Lab Results  Component Value Date   TSH 1.530 07/26/2019     3. Pure hypercholesterolemia Doe try to watch diet. Does no dedicated exercise but does stay active. Lab Results  Component Value Date   CHOL 151 07/26/2019   HDL 49 07/26/2019   LDLCALC 69 07/26/2019   TRIG 198 (H) 07/26/2019   CHOLHDL 3.1 07/26/2019     4. Gastroesophageal reflux disease without esophagitis Is on protonix daily and work well to keep symptom under  control.  5. Acute appendicitis with perforation and peritoneal abscess She had last visit in October with oncology and they released her for 2 years. She denies any abdominal pain. Still has diarrhea, she ha rx for lomotil and that helps  6. insomnia Take ambein to sleep at night  7. BMI 29.0-29.9 No recent weight changes  8. Recurrent major depressive disorder, in full remission [facility]) She I on lexapro daily, says she seem fine Depression screen [facility] 2/9 04/11/2020 11/16/2018 05/16/2018  Decreased Interest 0 0 0  Down, Depressed, Hopeless 0 0 0  PHQ - 2 Score 0 0 0       Outpatient Encounter Medications as of 04/11/2020  Medication Sig  . cetirizine (ZYRTEC) 10 MG tablet TAKE ONE (1) TABLET EACH DAY  . diphenoxylate-atropine (LOMOTIL) 2.5-0.025 MG tablet TAKE 1 TABLET 4 TIMES DAILY AS NEEDED FOR DIARRHEA OR LOOSE STOOLS  . escitalopram (LEXAPRO) 10 MG tablet TAKE ONE (1) TABLET EACH DAY (Needs to be seen before next refill)  . fenofibrate 160 MG tablet TAKE ONE (1) TABLET EACH DAY (Needs to be seen before next refill)  . folic acid (FOLVITE) 1 MG tablet Take 1 tablet (1 mg total) by mouth daily.  Marland Kitchen levothyroxine (SYNTHROID) 125 MCG tablet TAKE 1 TABLET DAILY BEFORE BREAKFAST  . metoprolol succinate (TOPROL-XL) 50 MG 24 hr tablet TAKE ONE (1) TABLET EACH DAY (Needs to be seen before next refill)  . pantoprazole (PROTONIX) 40 MG tablet Take 1 tablet (40 mg total) by mouth daily as needed. (Needs to be seen before next refill)  .  predniSONE (STERAPRED UNI-PAK 21 TAB) 10 MG (21) TBPK tablet As directed x 6 days  . promethazine (PHENERGAN) 25 MG tablet TAKE 1 TABLET EVERY 6 HOURS AS NEEDED FOR NAUSEA AND VOMITING  . valsartan-hydrochlorothiazide (DIOVAN-HCT) 160-12.5 MG tablet TAKE ONE (1) TABLET EACH DAY (Needs to be seen before next refill)  . zolpidem (AMBIEN) 10 MG tablet TAKE 1 TABLET AT BEDTIME AS NEEDED FOR SLEEP     Past Surgical History:  Procedure Laterality Date  .  APPENDECTOMY    . CRYOTHERAPY     for abnormal pap  . DIAGNOSTIC LAPAROSCOPY    . DILATION AND CURETTAGE OF UTERUS    . infertility testing     . LAPAROSCOPIC PARTIAL COLECTOMY N/A 02/02/2016   Procedure: LAPAROSCOPIC PARTIAL COlectomy right colon;  Surgeon: Greer Pickerel, MD;  Location: [facility];  Service: General;  Laterality: N/A;  . OOPHORECTOMY     & fallopian tubes  . TONSILLECTOMY    . VAGINAL HYSTERECTOMY     total  . VESICOVAGINAL FISTULA CLOSURE W/ TAH     Dr. Margaretha Glassing  2 degree DUB     Family History  Problem Relation Age of Onset  . Hypertension Mother   . Irregular heart beat Mother   . Thyroid disease Maternal Grandmother   . Cancer Maternal Grandmother        lung  . Cancer Paternal Grandfather        lung    New complaints: Seems more fatigued then usual. Says sh crashes when she gets home in the evenings.  Social history: Live with husband. Her daughter and grandson live with her  Controlled substance contract: will have signed at next face to face visit  Review of Systems  Constitutional: Negative for diaphoresis and weight loss.  Eyes: Negative for blurred vision, double vision and pain.  Respiratory: Negative for shortness of breath.   Cardiovascular: Negative for chest pain, palpitations, orthopnea and leg swelling.  Gastrointestinal: Negative for abdominal pain.  Skin: Negative for rash.  Neurological: Negative for dizziness, sensory change, loss of consciousness, weakness and headaches.  Endo/Heme/Allergies: Negative for polydipsia. Does not bruise/bleed easily.  Psychiatric/Behavioral: Negative for memory loss. The patient does not have insomnia.   All other systems reviewed and are negative.    Observations/Objective: Alert and oriented- answers all questions appropriately No distress    Assessment and Plan: Casey [name] comes in today with chief complaint of Medical Management of Chronic Issues   Diagnosis and orders  addressed:  1. Essential hypertension Low sodium diet - valsartan-hydrochlorothiazide (DIOVAN-HCT) 160-12.5 MG tablet; TAKE ONE (1) TABLET EACH DAY (Needs to be seen before next refill)  Dispense: 90 tablet; Refill: 1 - metoprolol succinate (TOPROL-XL) 50 MG 24 hr tablet; TAKE ONE (1) TABLET EACH DAY (Needs to be seen before next refill)  Dispense: 90 tablet; Refill: 1 - CMP14+EGFR; Future  2. Acquired hypothyroidism - Thyroid Panel With TSH; Future  3. Pure hypercholesterolemia Low fat diet - fenofibrate 160 MG tablet; TAKE ONE (1) TABLET EACH DAY (Needs to be seen before next refill)  Dispense: 90 tablet; Refill: 1 - Lipid panel; Future  4. Gastroesophageal reflux disease without esophagitis Avoid spicy foods Do not eat 2 hours prior to bedtime  5. Acute appendicitis with perforation and peritoneal abscess See oncology if has any issues  6. Recurrent major depressive disorder, in full remission ([place]) stre management - escitalopram (LEXAPRO) 10 MG tablet; TAKE ONE (1) TABLET EACH DAY (Needs to be seen before  next refill)  Dispense: 90 tablet; Refill: 1  7. BMI 29.0-29.9,adult Discussed diet and exercise for person with BMI >25 Will recheck weight in 3-6 months  8. Chronic diarrhea Force fluids - diphenoxylate-atropine (LOMOTIL) 2.5-0.025 MG tablet; Take 2 tablets by mouth 4 (four) times daily.  Dispense: 120 tablet; Refill: 2  9. Primary insomnia Bedtime routine - zolpidem (AMBIEN) 10 MG tablet; Take 1 tablet (10 mg total) by mouth at bedtime as needed. for sleep  Dispense: 30 tablet; Refill: 5  10. Other fatigue Labs pending - CBC with Differential/Platelet; Future - Magnesium; Future - Vitamin B12; Future - VITAMIN D 25 Hydroxy (Vit-D Deficiency, Fractures); Future   Labs pending Health Maintenance reviewed Diet and exercise encouraged  Follow up plan: 6 month     I discussed the assessment and treatment plan with the patient. The patient was provided an  opportunity to ask questions and all were answered. The patient agreed with the plan and demonstrated an understanding of the instructions.   The patient was advised to call back or seek an in-person evaluation if the symptoms worsen or if the condition fails to improve as anticipated.  The above assessment and management plan was discussed with the patient. The patient verbalized understanding of and has agreed to the management plan. Patient is aware to call the clinic if symptoms persist or worsen. Patient is aware when to return to the clinic for a follow-up visit. Patient educated on when it is appropriate to go to the emergency department.   Time call ended:  11:15  I provided 20 minutes of non-face-to-face time during this encounter.    [name] Hassell Done, FNP

## 2020-04-15 ENCOUNTER — Other Ambulatory Visit: Payer: Self-pay

## 2020-04-15 ENCOUNTER — Other Ambulatory Visit: Payer: 59

## 2020-04-15 DIAGNOSIS — E039 Hypothyroidism, unspecified: Secondary | ICD-10-CM

## 2020-04-15 DIAGNOSIS — R5383 Other fatigue: Secondary | ICD-10-CM

## 2020-04-15 DIAGNOSIS — E7800 Pure hypercholesterolemia, unspecified: Secondary | ICD-10-CM

## 2020-04-15 DIAGNOSIS — I10 Essential (primary) hypertension: Secondary | ICD-10-CM

## 2020-04-16 LAB — LIPID PANEL
Chol/HDL Ratio: 2.4 ratio (ref 0.0–4.4)
Cholesterol, Total: 144 mg/dL (ref 100–199)
HDL: 59 mg/dL (ref >39)
LDL Chol Calc (NIH): 66 mg/dL (ref 0–99)
Triglycerides: 105 mg/dL (ref 0–149)
VLDL Cholesterol Cal: 19 mg/dL (ref 5–40)

## 2020-04-16 LAB — THYROID PANEL WITH TSH
Free Thyroxine Index: 2.2 (ref 1.2–4.9)
T3 Uptake Ratio: 28 % (ref 24–39)
T4, Total: 8 ug/dL (ref 4.5–12.0)
TSH: 12.1 u[IU]/mL — ABNORMAL HIGH (ref 0.450–4.500)

## 2020-04-16 LAB — CBC WITH DIFFERENTIAL/PLATELET
Basophils Absolute: 0.1 10*3/uL (ref 0.0–0.2)
Basos: 1 %
EOS (ABSOLUTE): 0.3 10*3/uL (ref 0.0–0.4)
Eos: 4 %
Hematocrit: 39.9 % (ref 34.0–46.6)
Hemoglobin: 13.1 g/dL (ref 11.1–15.9)
Immature Grans (Abs): 0 10*3/uL (ref 0.0–0.1)
Immature Granulocytes: 0 %
Lymphocytes Absolute: 2.9 10*3/uL (ref 0.7–3.1)
Lymphs: 40 %
MCH: 29.1 pg (ref 26.6–33.0)
MCHC: 32.8 g/dL (ref 31.5–35.7)
MCV: 89 fL (ref 79–97)
Monocytes Absolute: 0.6 10*3/uL (ref 0.1–0.9)
Monocytes: 8 %
Neutrophils Absolute: 3.5 10*3/uL (ref 1.4–7.0)
Neutrophils: 47 %
Platelets: 224 10*3/uL (ref 150–450)
RBC: 4.5 x10E6/uL (ref 3.77–5.28)
RDW: 12.5 % (ref 11.7–15.4)
WBC: 7.4 10*3/uL (ref 3.4–10.8)

## 2020-04-16 LAB — VITAMIN D 25 HYDROXY (VIT D DEFICIENCY, FRACTURES): Vit D, 25-Hydroxy: 17.4 ng/mL — ABNORMAL LOW (ref 30.0–100.0)

## 2020-04-16 LAB — CMP14+EGFR
ALT: 22 IU/L (ref 0–32)
AST: 21 IU/L (ref 0–40)
Albumin/Globulin Ratio: 1.6 (ref 1.2–2.2)
Albumin: 4.1 g/dL (ref 3.8–4.9)
Alkaline Phosphatase: 49 IU/L (ref 48–121)
BUN/Creatinine Ratio: 17 (ref 9–23)
BUN: 23 mg/dL (ref 6–24)
Bilirubin Total: 0.4 mg/dL (ref 0.0–1.2)
CO2: 21 mmol/L (ref 20–29)
Calcium: 9.6 mg/dL (ref 8.7–10.2)
Chloride: 104 mmol/L (ref 96–106)
Creatinine, Ser: 1.36 mg/dL — ABNORMAL HIGH (ref 0.57–1.00)
GFR calc Af Amer: 49 mL/min/{1.73_m2} — ABNORMAL LOW (ref >59)
GFR calc non Af Amer: 43 mL/min/{1.73_m2} — ABNORMAL LOW (ref >59)
Globulin, Total: 2.6 g/dL (ref 1.5–4.5)
Glucose: 80 mg/dL (ref 65–99)
Potassium: 3.6 mmol/L (ref 3.5–5.2)
Sodium: 145 mmol/L — ABNORMAL HIGH (ref 134–144)
Total Protein: 6.7 g/dL (ref 6.0–8.5)

## 2020-04-16 LAB — VITAMIN B12: Vitamin B-12: 132 pg/mL — ABNORMAL LOW (ref 232–1245)

## 2020-04-16 LAB — MAGNESIUM: Magnesium: 1.6 mg/dL (ref 1.6–2.3)

## 2020-04-17 ENCOUNTER — Other Ambulatory Visit: Payer: Self-pay | Admitting: *Deleted

## 2020-04-17 DIAGNOSIS — E039 Hypothyroidism, unspecified: Secondary | ICD-10-CM

## 2020-04-17 MED ORDER — LEVOTHYROXINE SODIUM 150 MCG PO TABS: 150.0000 ug | ORAL_TABLET | Freq: Every day | ORAL | 3 refills | Status: DC

## 2020-04-23 ENCOUNTER — Other Ambulatory Visit: Payer: Self-pay | Admitting: Nurse Practitioner

## 2020-04-23 MED ORDER — CYANOCOBALAMIN 1000 MCG/ML IJ SOLN
1000.0000 ug | INTRAMUSCULAR | 1 refills | Status: DC
Start: 2020-04-23 — End: 2020-11-25

## 2020-05-26 ENCOUNTER — Other Ambulatory Visit (HOSPITAL_COMMUNITY): Payer: Self-pay | Admitting: Hematology

## 2020-08-06 ENCOUNTER — Other Ambulatory Visit: Payer: Self-pay | Admitting: Nurse Practitioner

## 2020-08-06 DIAGNOSIS — E0500 Thyrotoxicosis with diffuse goiter without thyrotoxic crisis or storm: Secondary | ICD-10-CM

## 2020-08-06 DIAGNOSIS — Z6829 Body mass index (BMI) 29.0-29.9, adult: Secondary | ICD-10-CM

## 2020-08-06 DIAGNOSIS — I10 Essential (primary) hypertension: Secondary | ICD-10-CM

## 2020-08-07 ENCOUNTER — Ambulatory Visit: Payer: 59 | Admitting: Family Medicine

## 2020-08-07 ENCOUNTER — Other Ambulatory Visit: Payer: Self-pay

## 2020-08-07 ENCOUNTER — Other Ambulatory Visit (INDEPENDENT_AMBULATORY_CARE_PROVIDER_SITE_OTHER): Payer: 59

## 2020-08-07 ENCOUNTER — Encounter: Payer: Self-pay | Admitting: Family Medicine

## 2020-08-07 ENCOUNTER — Ambulatory Visit: Payer: 59 | Admitting: Nurse Practitioner

## 2020-08-07 VITALS — BP 133/78 | HR 66 | Temp 98.2°F | Ht 69.75 in | Wt 210.0 lb

## 2020-08-07 DIAGNOSIS — E0500 Thyrotoxicosis with diffuse goiter without thyrotoxic crisis or storm: Secondary | ICD-10-CM

## 2020-08-07 DIAGNOSIS — M792 Neuralgia and neuritis, unspecified: Secondary | ICD-10-CM | POA: Diagnosis not present

## 2020-08-07 DIAGNOSIS — M542 Cervicalgia: Secondary | ICD-10-CM

## 2020-08-07 DIAGNOSIS — I10 Essential (primary) hypertension: Secondary | ICD-10-CM | POA: Diagnosis not present

## 2020-08-07 MED ORDER — PREDNISONE 10 MG (21) PO TBPK: ORAL_TABLET | ORAL | 0 refills | Status: DC

## 2020-08-07 MED ORDER — METHYLPREDNISOLONE ACETATE 80 MG/ML IJ SUSP
80.0000 mg | Freq: Once | INTRAMUSCULAR | Status: AC
  Administered 2020-08-07: 80 mg via INTRAMUSCULAR

## 2020-08-07 MED ORDER — METHOCARBAMOL 500 MG PO TABS: 500.0000 mg | ORAL_TABLET | Freq: Three times a day (TID) | ORAL | 1 refills | Status: DC | PRN

## 2020-08-07 NOTE — Patient Instructions (Signed)
Start oral steroids tomorrow since you got a shot today.   Radicular Pain Radicular pain is a type of pain that spreads from your back or neck along a spinal nerve. Spinal nerves are nerves that leave the spinal cord and go to the muscles. Radicular pain is sometimes called radiculopathy, radiculitis, or a pinched nerve. When you have this type of pain, you may also have weakness, numbness, or tingling in the area of your body that is supplied by the nerve. The pain may feel sharp and burning. Depending on which spinal nerve is affected, the pain may occur in the:  Neck area (cervical radicular pain). You may also feel pain, numbness, weakness, or tingling in the arms.  Mid-spine area (thoracic radicular pain). You would feel this pain in the back and chest. This type is rare.  Lower back area (lumbar radicular pain). You would feel this pain as low back pain. You may feel pain, numbness, weakness, or tingling in the buttocks or legs. Sciatica is a type of lumbar radicular pain that shoots down the back of the leg. Radicular pain occurs when one of the spinal nerves becomes irritated or squeezed (compressed). It is often caused by something pushing on a spinal nerve, such as one of the bones of the spine (vertebrae) or one of the round cushions between vertebrae (intervertebral disks). This can result from:  An injury.  Wear and tear or aging of a disk.  The growth of a bone spur that pushes on the nerve. Radicular pain often goes away when you follow instructions from your health care provider for relieving pain at home. Follow these instructions at home: Managing pain      If directed, put ice on the affected area: ? Put ice in a plastic bag. ? Place a towel between your skin and the bag. ? Leave the ice on for 20 minutes, 2-3 times a day.  If directed, apply heat to the affected area as often as told by your health care provider. Use the heat source that your health care provider  recommends, such as a moist heat pack or a heating pad. ? Place a towel between your skin and the heat source. ? Leave the heat on for 20-30 minutes. ? Remove the heat if your skin turns bright red. This is especially important if you are unable to feel pain, heat, or cold. You may have a greater risk of getting burned. Activity   Do not sit or rest in bed for long periods of time.  Try to stay as active as possible. Ask your health care provider what type of exercise or activity is best for you.  Avoid activities that make your pain worse, such as bending and lifting.  Do not lift anything that is heavier than 10 lb (4.5 kg), or the limit that you are told, until your health care provider says that it is safe.  Practice using proper technique when lifting items. Proper lifting technique involves bending your knees and rising up.  Do strength and range-of-motion exercises only as told by your health care provider or physical therapist. General instructions  Take over-the-counter and prescription medicines only as told by your health care provider.  Pay attention to any changes in your symptoms.  Keep all follow-up visits as told by your health care provider. This is important. ? Your health care provider may send you to a physical therapist to help with this pain. Contact a health care provider if:  Your  pain and other symptoms get worse.  Your pain medicine is not helping.  Your pain has not improved after a few weeks of home care.  You have a fever. Get help right away if:  You have severe pain, weakness, or numbness.  You have difficulty with bladder or bowel control. Summary  Radicular pain is a type of pain that spreads from your back or neck along a spinal nerve.  When you have radicular pain, you may also have weakness, numbness, or tingling in the area of your body that is supplied by the nerve.  The pain may feel sharp or burning.  Radicular pain may be  treated with ice, heat, medicines, or physical therapy. This information is not intended to replace advice given to you by your health care provider. Make sure you discuss any questions you have with your health care provider. Document Revised: 05/23/2018 Document Reviewed: 05/23/2018 Elsevier Patient Education  [employer].

## 2020-08-07 NOTE — Progress Notes (Signed)
Assessment & Plan:  1. Musculoskeletal neck pain - Advised to start oral steroids tomorrow since she got an injection in office today.  - methylPREDNISolone acetate (DEPO-MEDROL) injection 80 mg - predniSONE (STERAPRED UNI-PAK 21 TAB) 10 MG (21) TBPK tablet; As directed x 6 days  Dispense: 21 tablet; Refill: 0 - methocarbamol (ROBAXIN) 500 MG tablet; Take 1 tablet (500 mg total) by mouth every 8 (eight) hours as needed for muscle spasms.  Dispense: 30 tablet; Refill: 1  2. Radicular pain in left arm - Education provided on radicular pain.  - methylPREDNISolone acetate (DEPO-MEDROL) injection 80 mg - predniSONE (STERAPRED UNI-PAK 21 TAB) 10 MG (21) TBPK tablet; As directed x 6 days  Dispense: 21 tablet; Refill: 0 - methocarbamol (ROBAXIN) 500 MG tablet; Take 1 tablet (500 mg total) by mouth every 8 (eight) hours as needed for muscle spasms.  Dispense: 30 tablet; Refill: 1   Follow up plan: Return if symptoms worsen or fail to improve.  Hendricks Limes, MSN, APRN, FNP-C [facility]  Subjective:   Patient ID: Casey [name], female    DOB: 09-Apr-1961, 59 y.o.   MRN: [id number]  HPI: Casey [name] is a 59 y.o. female presenting on 08/07/2020 for Shoulder Pain (started about wk & half, maybe started in neck going down arm now, today hands numb, alternating tylenol & ibuprofen)  Patient complains of pain that started in her neck, down between her shoulder blades that has moved to her left shoulder.  This all started a week and a half ago.  Patient reports her left arm feels asleep and like all the muscles are spasming.  Patient reports nausea due to the pain.  Rates pain 10/10 today and states that it gets worse every day.  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  She has been taking ibuprofen 800 mg every 8 hours with Tylenol in between.   ROS: Negative unless specifically indicated above in HPI.   Relevant past medical  history reviewed and updated as indicated.   Allergies and medications reviewed and updated.   Current Outpatient Medications:  .  cetirizine (ZYRTEC) 10 MG tablet, TAKE ONE (1) TABLET EACH DAY, Disp: 90 tablet, Rfl: 1 .  cyanocobalamin (,VITAMIN B-12,) 1000 MCG/ML injection, Inject 1 mL (1,000 mcg total) into the muscle every 30 (thirty) days., Disp: 30 mL, Rfl: 1 .  diphenoxylate-atropine (LOMOTIL) 2.5-0.025 MG tablet, Take 2 tablets by mouth 4 (four) times daily., Disp: 120 tablet, Rfl: 2 .  escitalopram (LEXAPRO) 10 MG tablet, TAKE ONE (1) TABLET EACH DAY (Needs to be seen before next refill), Disp: 90 tablet, Rfl: 1 .  fenofibrate 160 MG tablet, TAKE ONE (1) TABLET EACH DAY (Needs to be seen before next refill), Disp: 90 tablet, Rfl: 1 .  folic acid (FOLVITE) 1 MG tablet, TAKE ONE (1) TABLET EACH DAY, Disp: 90 tablet, Rfl: 3 .  levothyroxine (SYNTHROID) 150 MCG tablet, Take 1 tablet (150 mcg total) by mouth daily., Disp: 90 tablet, Rfl: 3 .  metoprolol succinate (TOPROL-XL) 50 MG 24 hr tablet, TAKE ONE (1) TABLET EACH DAY (Needs to be seen before next refill), Disp: 90 tablet, Rfl: 1 .  pantoprazole (PROTONIX) 40 MG tablet, Take 1 tablet (40 mg total) by mouth daily as needed. (Needs to be seen before next refill), Disp: 90 tablet, Rfl: 1 .  promethazine (PHENERGAN) 25 MG tablet, TAKE 1 TABLET EVERY 6 HOURS AS NEEDED FOR  NAUSEA AND VOMITING, Disp: 30 tablet, Rfl: 0 .  RESTASIS 0.05 % ophthalmic emulsion, 2 drops in the morning and at bedtime., Disp: , Rfl:  .  valsartan-hydrochlorothiazide (DIOVAN-HCT) 160-12.5 MG tablet, TAKE ONE (1) TABLET EACH DAY (Needs to be seen before next refill), Disp: 90 tablet, Rfl: 1 .  zolpidem (AMBIEN) 10 MG tablet, Take 1 tablet (10 mg total) by mouth at bedtime as needed. for sleep, Disp: 30 tablet, Rfl: 5  Allergies  Allergen Reactions  . Adhesive  [Tape] Itching    BANDAIDS  . Buprenorphine Hcl Itching  . Labetalol Itching  . Morphine And Related  Itching  . Other     [place] seasoning   . Zosyn [Piperacillin Sod-Tazobactam So] Rash    Pt states has had dose since and did not have difficulty, rash only on first dose , not in doses following     Objective:   BP 133/78   Pulse 66   Temp 98.2 F (36.8 C) (Temporal)   Ht 5' 9.75" (1.772 m)   Wt 210 lb (95.3 kg)   LMP 05/23/1995   BMI 30.35 kg/m    Physical Exam Vitals reviewed.  Constitutional:      General: She is not in acute distress.    Appearance: Normal appearance. She is obese. She is not ill-appearing, toxic-appearing or diaphoretic.  HENT:     Head: Normocephalic and atraumatic.  Eyes:     General: No scleral icterus.       Right eye: No discharge.        Left eye: No discharge.     Conjunctiva/sclera: Conjunctivae normal.  Cardiovascular:     Rate and Rhythm: Normal rate.  Pulmonary:     Effort: Pulmonary effort is normal. No respiratory distress.  Musculoskeletal:        General: Normal range of motion.     Left shoulder: Normal.     Left upper arm: Normal.     Cervical back: Normal range of motion. Muscular tenderness present.  Skin:    General: Skin is warm and dry.     Capillary Refill: Capillary refill takes less than 2 seconds.  Neurological:     General: No focal deficit present.     Mental Status: She is alert and oriented to person, place, and time. Mental status is at baseline.  Psychiatric:        Mood and Affect: Mood normal.        Behavior: Behavior normal.        Thought Content: Thought content normal.        Judgment: Judgment normal.

## 2020-08-08 LAB — THYROID PANEL WITH TSH
Free Thyroxine Index: 3.3 (ref 1.2–4.9)
T3 Uptake Ratio: 32 % (ref 24–39)
T4, Total: 10.3 ug/dL (ref 4.5–12.0)
TSH: 0.506 u[IU]/mL (ref 0.450–4.500)

## 2020-08-08 LAB — CBC WITH DIFFERENTIAL/PLATELET
Basophils Absolute: 0.1 10*3/uL (ref 0.0–0.2)
Basos: 1 %
EOS (ABSOLUTE): 0.2 10*3/uL (ref 0.0–0.4)
Eos: 3 %
Hematocrit: 37.4 % (ref 34.0–46.6)
Hemoglobin: 12.3 g/dL (ref 11.1–15.9)
Immature Grans (Abs): 0 10*3/uL (ref 0.0–0.1)
Immature Granulocytes: 0 %
Lymphocytes Absolute: 2.1 10*3/uL (ref 0.7–3.1)
Lymphs: 28 %
MCH: 28.9 pg (ref 26.6–33.0)
MCHC: 32.9 g/dL (ref 31.5–35.7)
MCV: 88 fL (ref 79–97)
Monocytes Absolute: 0.7 10*3/uL (ref 0.1–0.9)
Monocytes: 9 %
Neutrophils Absolute: 4.5 10*3/uL (ref 1.4–7.0)
Neutrophils: 59 %
Platelets: 220 10*3/uL (ref 150–450)
RBC: 4.25 x10E6/uL (ref 3.77–5.28)
RDW: 12.1 % (ref 11.7–15.4)
WBC: 7.5 10*3/uL (ref 3.4–10.8)

## 2020-08-08 LAB — LIPID PANEL
Chol/HDL Ratio: 2.8 ratio (ref 0.0–4.4)
Cholesterol, Total: 141 mg/dL (ref 100–199)
HDL: 51 mg/dL (ref >39)
LDL Chol Calc (NIH): 71 mg/dL (ref 0–99)
Triglycerides: 104 mg/dL (ref 0–149)
VLDL Cholesterol Cal: 19 mg/dL (ref 5–40)

## 2020-08-08 LAB — CMP14+EGFR
ALT: 20 IU/L (ref 0–32)
AST: 19 IU/L (ref 0–40)
Albumin/Globulin Ratio: 2 (ref 1.2–2.2)
Albumin: 4.5 g/dL (ref 3.8–4.9)
Alkaline Phosphatase: 47 IU/L (ref 44–121)
BUN/Creatinine Ratio: 18 (ref 9–23)
BUN: 27 mg/dL — ABNORMAL HIGH (ref 6–24)
Bilirubin Total: 0.3 mg/dL (ref 0.0–1.2)
CO2: 24 mmol/L (ref 20–29)
Calcium: 9.4 mg/dL (ref 8.7–10.2)
Chloride: 103 mmol/L (ref 96–106)
Creatinine, Ser: 1.52 mg/dL — ABNORMAL HIGH (ref 0.57–1.00)
GFR calc Af Amer: 43 mL/min/{1.73_m2} — ABNORMAL LOW (ref >59)
GFR calc non Af Amer: 38 mL/min/{1.73_m2} — ABNORMAL LOW (ref >59)
Globulin, Total: 2.2 g/dL (ref 1.5–4.5)
Glucose: 92 mg/dL (ref 65–99)
Potassium: 3.7 mmol/L (ref 3.5–5.2)
Sodium: 142 mmol/L (ref 134–144)
Total Protein: 6.7 g/dL (ref 6.0–8.5)

## 2020-08-10 ENCOUNTER — Encounter: Payer: Self-pay | Admitting: Family Medicine

## 2020-08-20 ENCOUNTER — Other Ambulatory Visit: Payer: Self-pay | Admitting: Nurse Practitioner

## 2020-08-22 ENCOUNTER — Other Ambulatory Visit: Payer: Self-pay | Admitting: Nurse Practitioner

## 2020-08-22 ENCOUNTER — Telehealth: Payer: Self-pay | Admitting: Nurse Practitioner

## 2020-08-22 DIAGNOSIS — M542 Cervicalgia: Secondary | ICD-10-CM

## 2020-08-22 NOTE — Telephone Encounter (Signed)
Left arm pain no better since taking steroids. Now right arm is starting to hurt. The pain goes all the way down her left arm with numbness. Some pain when turning head.   Will need MRI of neck

## 2020-08-23 ENCOUNTER — Other Ambulatory Visit: Payer: Self-pay | Admitting: Family Medicine

## 2020-08-23 DIAGNOSIS — M542 Cervicalgia: Secondary | ICD-10-CM

## 2020-08-23 DIAGNOSIS — M792 Neuralgia and neuritis, unspecified: Secondary | ICD-10-CM

## 2020-08-26 ENCOUNTER — Telehealth: Payer: Self-pay | Admitting: Nurse Practitioner

## 2020-09-02 ENCOUNTER — Other Ambulatory Visit: Payer: Self-pay | Admitting: Nurse Practitioner

## 2020-09-02 DIAGNOSIS — M792 Neuralgia and neuritis, unspecified: Secondary | ICD-10-CM

## 2020-09-02 DIAGNOSIS — M542 Cervicalgia: Secondary | ICD-10-CM

## 2020-09-07 ENCOUNTER — Other Ambulatory Visit: Payer: Self-pay | Admitting: Nurse Practitioner

## 2020-09-07 DIAGNOSIS — F5101 Primary insomnia: Secondary | ICD-10-CM

## 2020-09-08 ENCOUNTER — Ambulatory Visit (HOSPITAL_COMMUNITY)
Admission: RE | Admit: 2020-09-08 | Discharge: 2020-09-08 | Disposition: A | Discharge status: Home / Self Care | Payer: 59 | Source: Ambulatory Visit | Attending: Nurse Practitioner | Admitting: Nurse Practitioner

## 2020-09-08 ENCOUNTER — Other Ambulatory Visit: Payer: Self-pay

## 2020-09-08 DIAGNOSIS — M542 Cervicalgia: Secondary | ICD-10-CM | POA: Insufficient documentation

## 2020-09-08 IMAGING — MR MR CERVICAL SPINE W/O CM
3 series · 48 of 48 positions shown · non-contrast
Comparison: None.

CLINICAL DATA: Neck pain, chronic.

EXAM:
MRI CERVICAL SPINE WITHOUT CONTRAST
TECHNIQUE: Multiplanar, multisequence MR imaging of the cervical spine was
performed. No intravenous contrast was administered.

[Series 5: T2 · sagittal · 3.0mm · 0.69mm/px · 12 of 15 slices shown (1 of 2)]
[im 1/15]
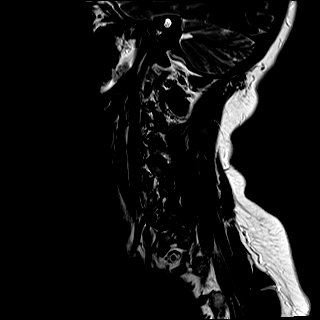
[im 2/15]
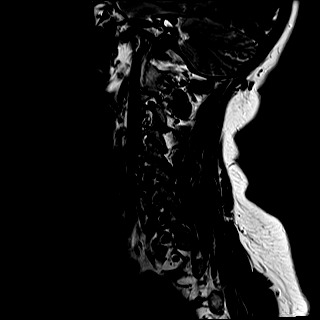
[im 3/15]
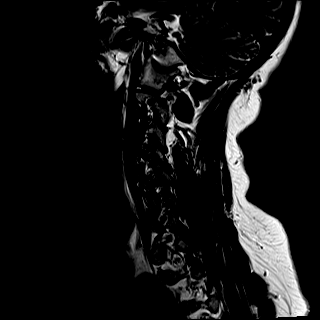
[im 4/15]
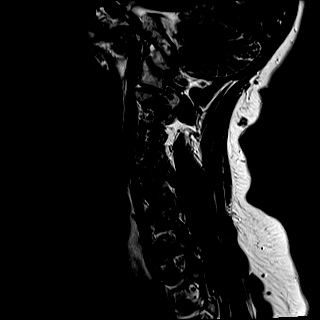
[im 6/15]
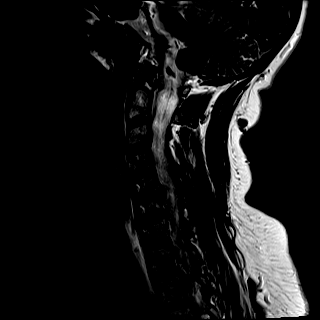
[im 7/15]
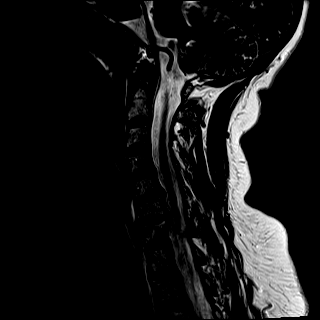
[im 8/15]
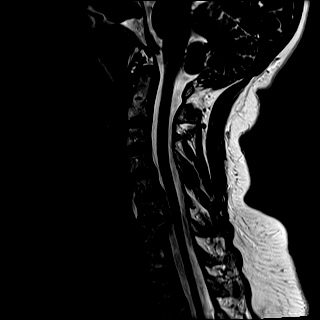
[im 9/15]
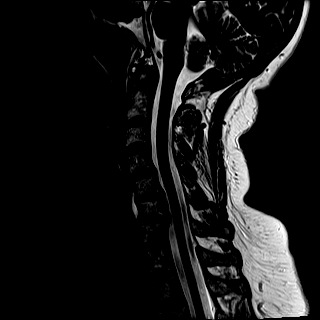
[im 11/15]
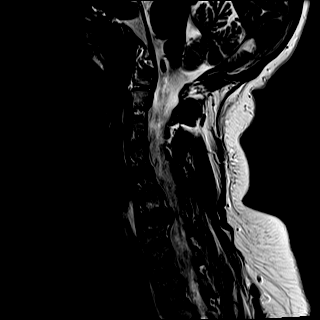
[im 12/15]
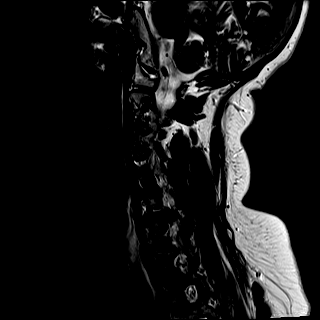
[im 13/15]
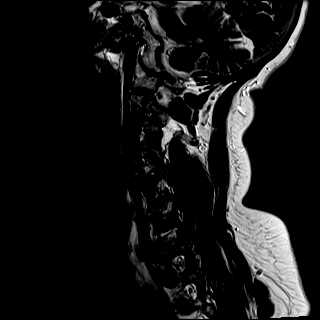
[im 15/15]
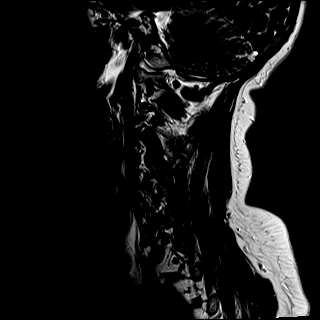

[Series 7: STIR · sagittal · 3.0mm · 0.69mm/px · 12 of 15 slices shown]
[im 1/15]
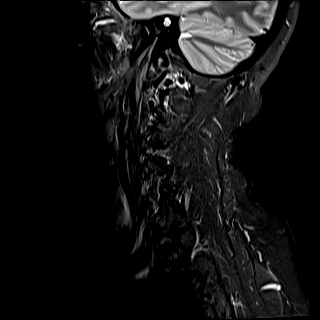
[im 2/15]
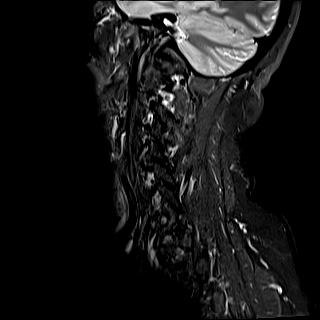
[im 3/15]
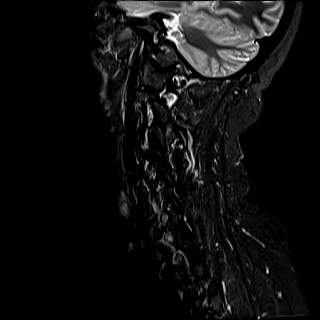
[im 4/15]
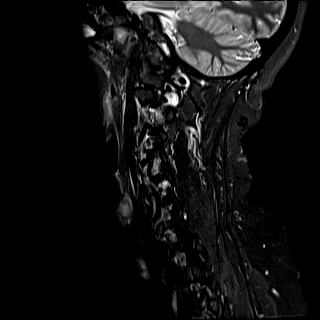
[im 6/15]
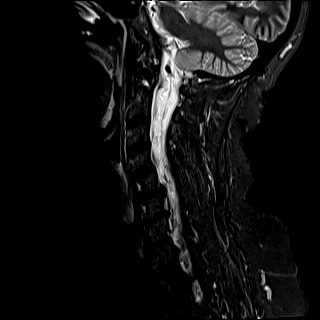
[im 7/15]
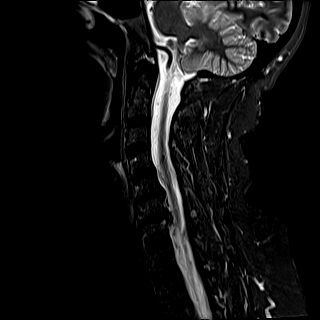
[im 8/15]
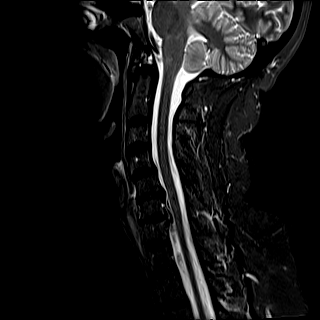
[im 9/15]
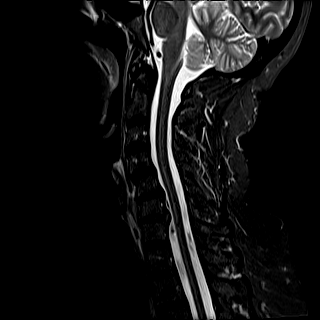
[im 11/15]
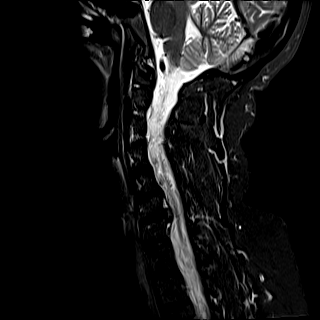
[im 12/15]
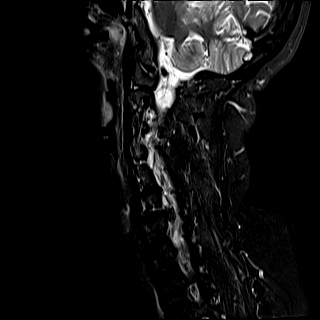
[im 13/15]
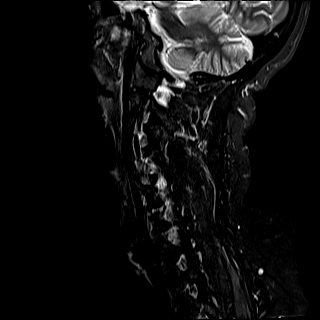
[im 15/15]
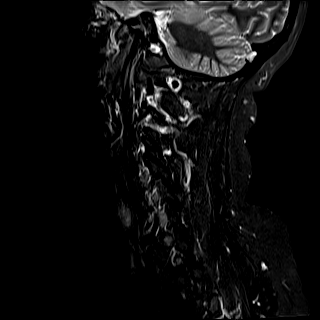

[Series 1012: T2 · axial · 3.0mm · 0.39mm/px · z∈[-118,-27]mm · 24 of 29 slices shown (2 of 2)]
[im 1/29]
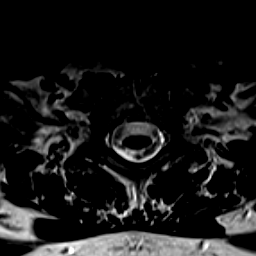
[im 2/29]
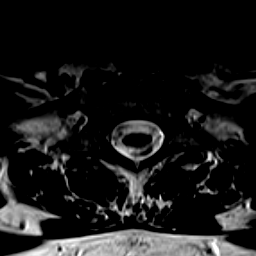
[im 3/29]
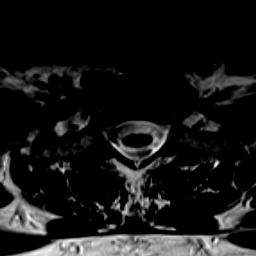
[im 4/29]
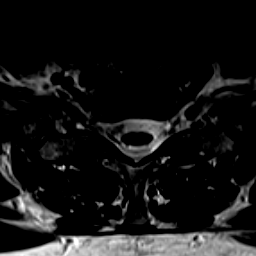
[im 5/29]
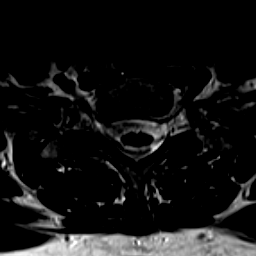
[im 7/29]
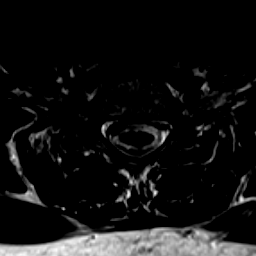
[im 8/29]
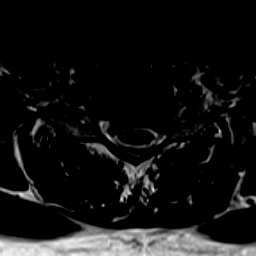
[im 9/29]
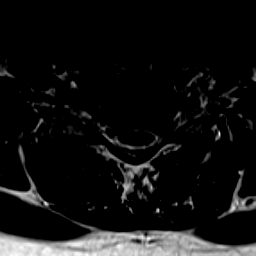
[im 10/29]
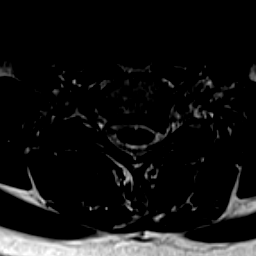
[im 11/29]
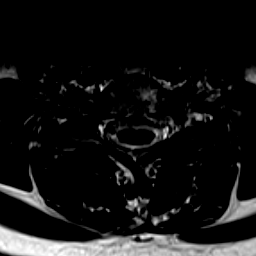
[im 13/29]
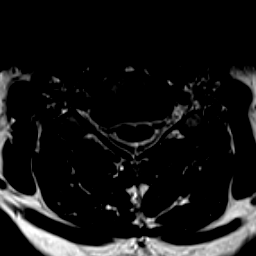
[im 14/29]
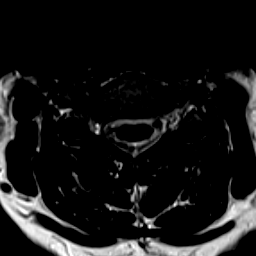
[im 15/29]
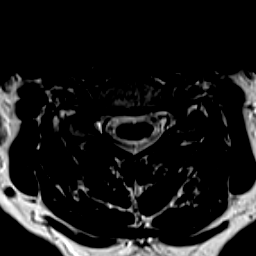
[im 16/29]
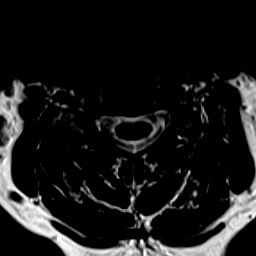
[im 18/29]
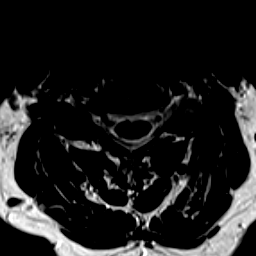
[im 19/29]
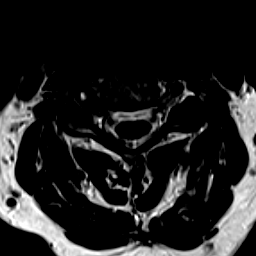
[im 20/29]
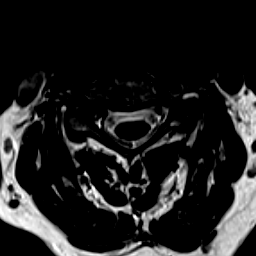
[im 21/29]
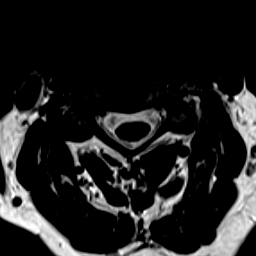
[im 22/29]
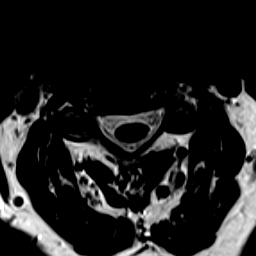
[im 24/29]
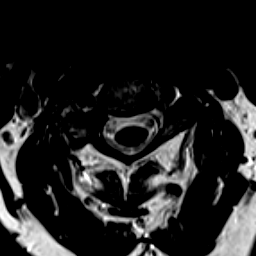
[im 25/29]
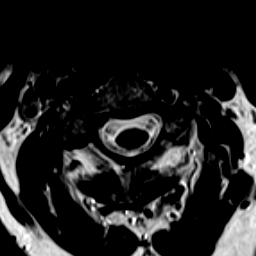
[im 26/29]
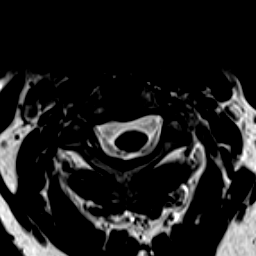
[im 27/29]
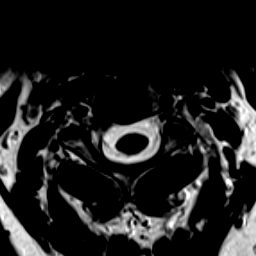
[im 29/29]
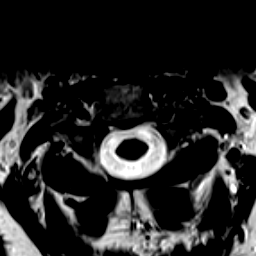

[48 of 48 positions shown; findings below may reference images not displayed]

FINDINGS: Alignment: Mild reversal of the cervical curvature. Minimal
anterolisthesis of C4 over C5.

Vertebrae: No fracture, evidence of discitis, or bone lesion.

Cord: Normal signal and morphology.

Posterior Fossa, vertebral arteries, paraspinal tissues: Negative.

Disc levels:

C2-3: No spinal canal or neural foraminal stenosis.

C3-4: Uncovertebral and facet degenerative changes resulting in mild
right neural foraminal narrowing. No spinal canal stenosis.

C4-5: Small posterior disc protrusion without significant spinal
canal stenosis. Uncovertebral and facet degenerative changes
resulting in mild right neural foraminal narrowing.

C5-6: Loss of disc height, posterior disc protrusion without
significant spinal canal stenosis. Uncovertebral and facet
degenerative changes resulting in moderate right and mild left
neural foraminal narrowing.

C6-7: Posterior disc protrusion with associated foraminal extruded
component resulting in severe left neural foraminal narrowing. No
significant spinal canal or right neural foraminal narrowing.

C7-T1: No spinal canal or neural foraminal stenosis.
IMPRESSION: 1. Multilevel degenerative changes of the cervical spine as above,
worst at C6-C7 where there is severe left neural foraminal narrowing
secondary to a foraminal extruded disc component.
2. Moderate right neural foraminal narrowing at C5-6.
3. No significant spinal canal stenosis at any level.

## 2020-09-09 ENCOUNTER — Other Ambulatory Visit: Payer: Self-pay | Admitting: Nurse Practitioner

## 2020-09-09 DIAGNOSIS — M542 Cervicalgia: Secondary | ICD-10-CM

## 2020-09-09 DIAGNOSIS — G8929 Other chronic pain: Secondary | ICD-10-CM

## 2020-09-09 NOTE — Progress Notes (Signed)
Ref

## 2020-09-18 ENCOUNTER — Ambulatory Visit: Payer: 59 | Admitting: Nurse Practitioner

## 2020-09-18 ENCOUNTER — Other Ambulatory Visit: Payer: Self-pay

## 2020-09-18 ENCOUNTER — Encounter: Payer: Self-pay | Admitting: Nurse Practitioner

## 2020-09-18 VITALS — BP 113/78 | HR 59 | Temp 97.5°F | Resp 20 | Ht 69.0 in | Wt 206.0 lb

## 2020-09-18 DIAGNOSIS — E0500 Thyrotoxicosis with diffuse goiter without thyrotoxic crisis or storm: Secondary | ICD-10-CM | POA: Diagnosis not present

## 2020-09-18 DIAGNOSIS — Z6829 Body mass index (BMI) 29.0-29.9, adult: Secondary | ICD-10-CM

## 2020-09-18 DIAGNOSIS — K219 Gastro-esophageal reflux disease without esophagitis: Secondary | ICD-10-CM

## 2020-09-18 DIAGNOSIS — I10 Essential (primary) hypertension: Secondary | ICD-10-CM

## 2020-09-18 DIAGNOSIS — F3342 Major depressive disorder, recurrent, in full remission: Secondary | ICD-10-CM

## 2020-09-18 DIAGNOSIS — K529 Noninfective gastroenteritis and colitis, unspecified: Secondary | ICD-10-CM

## 2020-09-18 DIAGNOSIS — E039 Hypothyroidism, unspecified: Secondary | ICD-10-CM

## 2020-09-18 DIAGNOSIS — F5101 Primary insomnia: Secondary | ICD-10-CM

## 2020-09-18 DIAGNOSIS — E7800 Pure hypercholesterolemia, unspecified: Secondary | ICD-10-CM

## 2020-09-18 DIAGNOSIS — O280 Abnormal hematological finding on antenatal screening of mother: Secondary | ICD-10-CM

## 2020-09-18 DIAGNOSIS — R7989 Other specified abnormal findings of blood chemistry: Secondary | ICD-10-CM

## 2020-09-18 DIAGNOSIS — D373 Neoplasm of uncertain behavior of appendix: Secondary | ICD-10-CM

## 2020-09-18 DIAGNOSIS — M2559 Pain in other specified joint: Secondary | ICD-10-CM

## 2020-09-18 MED ORDER — DIPHENOXYLATE-ATROPINE 2.5-0.025 MG PO TABS: 2.0000 | ORAL_TABLET | Freq: Four times a day (QID) | ORAL | 2 refills | Status: DC

## 2020-09-18 MED ORDER — METOPROLOL SUCCINATE ER 50 MG PO TB24: ORAL_TABLET | ORAL | 1 refills | Status: DC

## 2020-09-18 MED ORDER — FENOFIBRATE 160 MG PO TABS: ORAL_TABLET | ORAL | 1 refills | Status: DC

## 2020-09-18 MED ORDER — LEVOTHYROXINE SODIUM 150 MCG PO TABS: 150.0000 ug | ORAL_TABLET | Freq: Every day | ORAL | 1 refills | Status: DC

## 2020-09-18 MED ORDER — ESCITALOPRAM OXALATE 10 MG PO TABS: ORAL_TABLET | ORAL | 1 refills | Status: DC

## 2020-09-18 MED ORDER — ZOLPIDEM TARTRATE 10 MG PO TABS: 10.0000 mg | ORAL_TABLET | Freq: Every evening | ORAL | 5 refills | Status: DC | PRN

## 2020-09-18 MED ORDER — PANTOPRAZOLE SODIUM 40 MG PO TBEC: 40.0000 mg | DELAYED_RELEASE_TABLET | Freq: Every day | ORAL | 1 refills | Status: DC | PRN

## 2020-09-18 MED ORDER — VALSARTAN-HYDROCHLOROTHIAZIDE 160-12.5 MG PO TABS: ORAL_TABLET | ORAL | 1 refills | Status: DC

## 2020-09-18 NOTE — Patient Instructions (Signed)
DASH Eating Plan DASH stands for "Dietary Approaches to Stop Hypertension." The DASH eating plan is a healthy eating plan that has been shown to reduce high blood pressure (hypertension). It may also reduce your risk for type 2 diabetes, heart disease, and stroke. The DASH eating plan may also help with weight loss. What are tips for following this plan?  General guidelines  Avoid eating more than 2,300 mg (milligrams) of salt (sodium) a day. If you have hypertension, you may need to reduce your sodium intake to 1,500 mg a day.  Limit alcohol intake to no more than 1 drink a day for nonpregnant women and 2 drinks a day for men. One drink equals 12 oz of beer, 5 oz of wine, or 1 oz of hard liquor.  Work with your health care provider to maintain a healthy body weight or to lose weight. Ask what an ideal weight is for you.  Get at least 30 minutes of exercise that causes your heart to beat faster (aerobic exercise) most days of the week. Activities may include walking, swimming, or biking.  Work with your health care provider or diet and nutrition specialist (dietitian) to adjust your eating plan to your individual calorie needs. Reading food labels   Check food labels for the amount of sodium per serving. Choose foods with less than 5 percent of the Daily Value of sodium. Generally, foods with less than 300 mg of sodium per serving fit into this eating plan.  To find whole grains, look for the word "whole" as the first word in the ingredient list. Shopping  Buy products labeled as "low-sodium" or "no salt added."  Buy fresh foods. Avoid canned foods and premade or frozen meals. Cooking  Avoid adding salt when cooking. Use salt-free seasonings or herbs instead of table salt or sea salt. Check with your health care provider or pharmacist before using salt substitutes.  Do not fry foods. Cook foods using healthy methods such as baking, boiling, grilling, and broiling instead.  Cook with  heart-healthy oils, such as olive, canola, soybean, or sunflower oil. Meal planning  Eat a balanced diet that includes: ? 5 or more servings of fruits and vegetables each day. At each meal, try to fill half of your plate with fruits and vegetables. ? Up to 6-8 servings of whole grains each day. ? Less than 6 oz of lean meat, poultry, or fish each day. A 3-oz serving of meat is about the same size as a deck of cards. One egg equals 1 oz. ? 2 servings of low-fat dairy each day. ? A serving of nuts, seeds, or beans 5 times each week. ? Heart-healthy fats. Healthy fats called Omega-3 fatty acids are found in foods such as flaxseeds and coldwater fish, like sardines, salmon, and mackerel.  Limit how much you eat of the following: ? Canned or prepackaged foods. ? Food that is high in trans fat, such as fried foods. ? Food that is high in saturated fat, such as fatty meat. ? Sweets, desserts, sugary drinks, and other foods with added sugar. ? Full-fat dairy products.  Do not salt foods before eating.  Try to eat at least 2 vegetarian meals each week.  Eat more home-cooked food and less restaurant, buffet, and fast food.  When eating at a restaurant, ask that your food be prepared with less salt or no salt, if possible. What foods are recommended? The items listed may not be a complete list. Talk with your dietitian about   what dietary choices are best for you. Grains Whole-grain or whole-wheat bread. Whole-grain or whole-wheat pasta. Brown rice. Oatmeal. Quinoa. Bulgur. Whole-grain and low-sodium cereals. Pita bread. Low-fat, low-sodium crackers. Whole-wheat flour tortillas. Vegetables Fresh or frozen vegetables (raw, steamed, roasted, or grilled). Low-sodium or reduced-sodium tomato and vegetable juice. Low-sodium or reduced-sodium tomato sauce and tomato paste. Low-sodium or reduced-sodium canned vegetables. Fruits All fresh, dried, or frozen fruit. Canned fruit in natural juice (without  added sugar). Meat and other protein foods Skinless chicken or turkey. Ground chicken or turkey. Pork with fat trimmed off. Fish and seafood. Egg whites. Dried beans, peas, or lentils. Unsalted nuts, nut butters, and seeds. Unsalted canned beans. Lean cuts of beef with fat trimmed off. Low-sodium, lean deli meat. Dairy Low-fat (1%) or fat-free (skim) milk. Fat-free, low-fat, or reduced-fat cheeses. Nonfat, low-sodium ricotta or cottage cheese. Low-fat or nonfat yogurt. Low-fat, low-sodium cheese. Fats and oils Soft margarine without trans fats. Vegetable oil. Low-fat, reduced-fat, or light mayonnaise and salad dressings (reduced-sodium). Canola, safflower, olive, soybean, and sunflower oils. Avocado. Seasoning and other foods Herbs. Spices. Seasoning mixes without salt. Unsalted popcorn and pretzels. Fat-free sweets. What foods are not recommended? The items listed may not be a complete list. Talk with your dietitian about what dietary choices are best for you. Grains Baked goods made with fat, such as croissants, muffins, or some breads. Dry pasta or rice meal packs. Vegetables Creamed or fried vegetables. Vegetables in a cheese sauce. Regular canned vegetables (not low-sodium or reduced-sodium). Regular canned tomato sauce and paste (not low-sodium or reduced-sodium). Regular tomato and vegetable juice (not low-sodium or reduced-sodium). Pickles. Olives. Fruits Canned fruit in a light or heavy syrup. Fried fruit. Fruit in cream or butter sauce. Meat and other protein foods Fatty cuts of meat. Ribs. Fried meat. Bacon. Sausage. Bologna and other processed lunch meats. Salami. Fatback. Hotdogs. Bratwurst. Salted nuts and seeds. Canned beans with added salt. Canned or smoked fish. Whole eggs or egg yolks. Chicken or turkey with skin. Dairy Whole or 2% milk, cream, and half-and-half. Whole or full-fat cream cheese. Whole-fat or sweetened yogurt. Full-fat cheese. Nondairy creamers. Whipped toppings.  Processed cheese and cheese spreads. Fats and oils Butter. Stick margarine. Lard. Shortening. Ghee. Bacon fat. Tropical oils, such as coconut, palm kernel, or palm oil. Seasoning and other foods Salted popcorn and pretzels. Onion salt, garlic salt, seasoned salt, table salt, and sea salt. Worcestershire sauce. Tartar sauce. Barbecue sauce. Teriyaki sauce. Soy sauce, including reduced-sodium. Steak sauce. Canned and packaged gravies. Fish sauce. Oyster sauce. Cocktail sauce. Horseradish that you find on the shelf. Ketchup. Mustard. Meat flavorings and tenderizers. Bouillon cubes. Hot sauce and Tabasco sauce. Premade or packaged marinades. Premade or packaged taco seasonings. Relishes. Regular salad dressings. Where to find more information:  National Heart, Lung, and Blood Institute: www.nhlbi.nih.gov  American Heart Association: www.heart.org Summary  The DASH eating plan is a healthy eating plan that has been shown to reduce high blood pressure (hypertension). It may also reduce your risk for type 2 diabetes, heart disease, and stroke.  With the DASH eating plan, you should limit salt (sodium) intake to 2,300 mg a day. If you have hypertension, you may need to reduce your sodium intake to 1,500 mg a day.  When on the DASH eating plan, aim to eat more fresh fruits and vegetables, whole grains, lean proteins, low-fat dairy, and heart-healthy fats.  Work with your health care provider or diet and nutrition specialist (dietitian) to adjust your eating plan to your   individual calorie needs. This information is not intended to replace advice given to you by your health care provider. Make sure you discuss any questions you have with your health care provider. Document Revised: 10/21/2017 Document Reviewed: 11/01/2016 Elsevier Patient Education  2020 Elsevier Inc.  

## 2020-09-18 NOTE — Progress Notes (Signed)
Subjective:    Patient ID: Casey [name], female    DOB: 07/22/1961, 59 y.o.   MRN: [id number]   Chief Complaint: Medical Management of Chronic Issues    HPI:  1. Primary hypertension No c/o chest pain, sob or headache. Does not check blood pressure at home. BP Readings from Last 3 Encounters:  08/07/20 133/78  11/20/18 117/66  11/16/18 102/68     2. Pure hypercholesterolemia Does try to watch diet. Doe snot do much  Exercise. Lab Results  Component Value Date   CHOL 141 08/07/2020   HDL 51 08/07/2020   LDLCALC 71 08/07/2020   TRIG 104 08/07/2020   CHOLHDL 2.8 08/07/2020     3. Gastroesophageal reflux disease without esophagitis Is on protonix daily and is doing well.  4. Graves' disease nop problems that aware of  5. Acquired hypothyroidism We increased her dose of lyvothyroxin at last visit. Lab Results  Component Value Date   TSH 0.506 08/07/2020     6. Recurrent major depressive disorder, in full remission ([place]) Is on lexapro and is doing well. 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 She is having a scan tomorrow for recheck.  8. BMI 29.0-29.9,adult No recent weight changes Wt Readings from Last 3 Encounters:  09/18/20 206 lb (93.4 kg)  08/07/20 210 lb (95.3 kg)  11/20/18 206 lb 6.4 oz (93.6 kg)   BMI Readings from Last 3 Encounters:  09/18/20 30.42 kg/m  08/07/20 30.35 kg/m  11/20/18 29.83 kg/m       Outpatient Encounter Medications as of  09/18/2020  Medication Sig  . methocarbamol (ROBAXIN) 500 MG tablet TAKE 1 TABLET EVERY 8 HOURS AS NEEDED FOR MUSCLE SPASMS  . cetirizine (ZYRTEC) 10 MG tablet TAKE ONE (1) TABLET EACH DAY  . cyanocobalamin (,VITAMIN B-12,) 1000 MCG/ML injection Inject 1 mL (1,000 mcg total) into the muscle every 30 (thirty) days.  . diphenoxylate-atropine (LOMOTIL) 2.5-0.025 MG tablet Take 2 tablets by mouth 4 (four) times daily.  Marland Kitchen escitalopram (LEXAPRO) 10 MG tablet TAKE ONE (1) TABLET EACH DAY (Needs to be seen before next refill)  . fenofibrate 160 MG tablet TAKE ONE (1) TABLET EACH DAY (Needs to be seen before next refill)  . folic acid (FOLVITE) 1 MG tablet TAKE ONE (1) TABLET EACH DAY  . levothyroxine (SYNTHROID) 150 MCG tablet Take 1 tablet (150 mcg total) by mouth daily.  . metoprolol succinate (TOPROL-XL) 50 MG 24 hr tablet TAKE ONE (1) TABLET EACH DAY (Needs to be seen before next refill)  . pantoprazole (PROTONIX) 40 MG tablet Take 1 tablet (40 mg total) by mouth daily as needed. (Needs to be seen before next refill)  . predniSONE (STERAPRED UNI-PAK 21 TAB) 10 MG (21) TBPK tablet As directed x 6 days  . promethazine (PHENERGAN) 25 MG tablet TAKE 1 TABLET EVERY 6 HOURS AS NEEDED FOR NAUSEA  AND VOMITING  . RESTASIS 0.05 % ophthalmic emulsion 2 drops in the morning and at bedtime.  . valsartan-hydrochlorothiazide (DIOVAN-HCT) 160-12.5 MG tablet TAKE ONE (1) TABLET EACH DAY (Needs to be seen before next refill)  . zolpidem (AMBIEN) 10 MG tablet Take 1 tablet (10 mg total) by mouth at bedtime as needed. for sleep     Past Surgical History:  Procedure Laterality Date  . APPENDECTOMY    . CRYOTHERAPY     for abnormal pap  . DIAGNOSTIC LAPAROSCOPY    . DILATION AND CURETTAGE OF UTERUS    . infertility testing     . LAPAROSCOPIC PARTIAL COLECTOMY N/A 02/02/2016   Procedure: LAPAROSCOPIC PARTIAL COlectomy right colon;  Surgeon: Greer Pickerel, MD;  Location: [facility];  Service: General;  Laterality: N/A;    . OOPHORECTOMY     & fallopian tubes  . TONSILLECTOMY    . VAGINAL HYSTERECTOMY     total  . VESICOVAGINAL FISTULA CLOSURE W/ TAH     Dr. Margaretha Glassing  2 degree DUB     Family History  Problem Relation Age of Onset  . Hypertension Mother   . Irregular heart beat Mother   . Thyroid disease Maternal Grandmother   . Cancer Maternal Grandmother        lung  . Cancer Paternal Grandfather        lung    New complaints: She has been complaining for several weeks of body cramps- I told her to take some magnesium.- she has been c/o arm and hand numbness- we felt It was coming from her neck. 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  Social history: Lives with her husband and her daughter and grandson live with her.  Controlled substance contract: n/a    Review of Systems  Constitutional: Negative for diaphoresis.  Eyes: Negative for pain.  Respiratory: Negative for shortness of breath.   Cardiovascular: Negative for chest pain, palpitations and leg swelling.  Gastrointestinal: Negative for abdominal pain.  Endocrine: Negative for polydipsia.  Skin: Negative for rash.  Neurological: Negative for dizziness, weakness and headaches.  Hematological: Does not bruise/bleed easily.  All other systems reviewed and are negative.      Objective:   Physical Exam Vitals and nursing note reviewed.  Constitutional:      General: She is not in acute distress.    Appearance: Normal appearance. She is well-developed.  HENT:     Head: Normocephalic.     Nose: Nose normal.  Eyes:     Pupils: Pupils are equal, round, and reactive to light.  Neck:     Vascular: No carotid bruit or JVD.  Cardiovascular:     Rate and Rhythm: Normal rate and regular rhythm.     Heart sounds: Normal heart sounds.  Pulmonary:     Effort: Pulmonary effort is normal. No respiratory distress.     Breath sounds: Normal breath sounds. No wheezing or rales.  Chest:     Chest wall:  No tenderness.  Abdominal:     General: Bowel sounds are normal. There is no distension or abdominal bruit.     Palpations: Abdomen is soft. There is no hepatomegaly, splenomegaly, mass or pulsatile mass.     Tenderness: There is no abdominal tenderness.  Musculoskeletal:        General: Normal range of motion.     Cervical back: Normal range of motion and neck supple.  Lymphadenopathy:     Cervical:  No cervical adenopathy.  Skin:    General: Skin is warm and dry.  Neurological:     Mental Status: She is alert and oriented to person, place, and time.     Deep Tendon Reflexes: Reflexes are normal and symmetric.  Psychiatric:        Behavior: Behavior normal.        Thought Content: Thought content normal.        Judgment: Judgment normal.    BP 113/78   Pulse (!) 59   Temp (!) 97.5 F (36.4 C) (Temporal)   Resp 20   Ht 5\' 9"  (1.753 m)   Wt 206 lb (93.4 kg)   LMP 05/23/1995   SpO2 98%   BMI 30.42 kg/m         Assessment & Plan:  Casey [name] comes in today with chief complaint of Medical Management of Chronic Issues   Diagnosis and orders addressed:  1. Primary hypertension Low sodium diet - Magnesium - valsartan-hydrochlorothiazide (DIOVAN-HCT) 160-12.5 MG tablet; TAKE ONE (1) TABLET EACH DAY (Needs to be seen before next refill)  Dispense: 90 tablet; Refill: 1 - metoprolol succinate (TOPROL-XL) 50 MG 24 hr tablet; TAKE ONE (1) TABLET EACH DAY (Needs to be seen before next refill)  Dispense: 90 tablet; Refill: 1  2. Pure hypercholesterolemia Low fat diet - fenofibrate 160 MG tablet; TAKE ONE (1) TABLET EACH DAY (Needs to be seen before next refill)  Dispense: 90 tablet; Refill: 1  3. Gastroesophageal reflux disease without esophagitis Avoid spicy foods Do not eat 2 hours prior to bedtime 4. Graves' disease Labs pending  5. Acquired hypothyroidism - levothyroxine (SYNTHROID) 150 MCG tablet; Take 1 tablet (150 mcg total) by mouth daily.  Dispense: 90  tablet; Refill: 1  6. 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 BMI 29.0-29.9,adult Discussed diet and exercise for person with BMI >25 Will recheck weight in 3-6 months  9. Low vitamin D level Labs pending - VITAMIN D 25 Hydroxy (Vit-D Deficiency, Fractures)  10. Low maternal serum vitamin B12 - Vitamin B12  11. Pain in other joint - Arthritis Panel  12. Chronic diarrhea - diphenoxylate-atropine (LOMOTIL) 2.5-0.025 MG tablet; Take 2 tablets by mouth 4 (four) times daily.  Dispense: 120 tablet; Refill: 2  14. Primary insomnia Bedtime routine - zolpidem (AMBIEN) 10 MG tablet; Take 1 tablet (10 mg total) by mouth at bedtime as needed. for sleep  Dispense: 30 tablet; Refill: 5   Labs pending Health Maintenance reviewed Diet and exercise encouraged  Follow up plan: 6 month and prn   [name] Hassell Done, FNP

## 2020-09-19 LAB — ARTHRITIS PANEL
Basophils Absolute: 0.1 10*3/uL (ref 0.0–0.2)
Basos: 1 %
EOS (ABSOLUTE): 0.2 10*3/uL (ref 0.0–0.4)
Eos: 2 %
Hematocrit: 38.1 % (ref 34.0–46.6)
Hemoglobin: 12.6 g/dL (ref 11.1–15.9)
Immature Grans (Abs): 0.1 10*3/uL (ref 0.0–0.1)
Immature Granulocytes: 1 %
Lymphocytes Absolute: 3.4 10*3/uL — ABNORMAL HIGH (ref 0.7–3.1)
Lymphs: 36 %
MCH: 29.2 pg (ref 26.6–33.0)
MCHC: 33.1 g/dL (ref 31.5–35.7)
MCV: 88 fL (ref 79–97)
Monocytes Absolute: 0.8 10*3/uL (ref 0.1–0.9)
Monocytes: 8 %
Neutrophils Absolute: 5 10*3/uL (ref 1.4–7.0)
Neutrophils: 52 %
Platelets: 265 10*3/uL (ref 150–450)
RBC: 4.31 x10E6/uL (ref 3.77–5.28)
RDW: 12 % (ref 11.7–15.4)
Rheumatoid fact SerPl-aCnc: <10 IU/mL (ref 0.0–13.9)
Sed Rate: 5 mm/hr (ref 0–40)
Uric Acid: 5.7 mg/dL (ref 3.0–7.2)
WBC: 9.5 10*3/uL (ref 3.4–10.8)

## 2020-09-19 LAB — VITAMIN B12: Vitamin B-12: 252 pg/mL (ref 232–1245)

## 2020-09-19 LAB — VITAMIN D 25 HYDROXY (VIT D DEFICIENCY, FRACTURES): Vit D, 25-Hydroxy: 29.1 ng/mL — ABNORMAL LOW (ref 30.0–100.0)

## 2020-09-19 LAB — MAGNESIUM: Magnesium: 1.8 mg/dL (ref 1.6–2.3)

## 2020-10-22 DIAGNOSIS — U071 COVID-19: Secondary | ICD-10-CM

## 2020-10-22 HISTORY — DX: COVID-19: U07.1

## 2020-10-28 ENCOUNTER — Encounter: Payer: Self-pay | Admitting: Nurse Practitioner

## 2020-10-28 ENCOUNTER — Other Ambulatory Visit: Payer: Self-pay | Admitting: Nurse Practitioner

## 2020-10-28 DIAGNOSIS — U071 COVID-19: Secondary | ICD-10-CM

## 2020-10-28 DIAGNOSIS — I10 Essential (primary) hypertension: Secondary | ICD-10-CM

## 2020-10-28 NOTE — Progress Notes (Signed)
I connected by phone with Casey [name] on 10/28/2020 at 10:45 AM to discuss the potential use of a new treatment for mild to moderate COVID-19 viral infection in non-hospitalized patients.  This patient is a 59 y.o. female that meets the FDA criteria for Emergency Use Authorization of COVID monoclonal antibody REGEN-COV.  Has a (+) direct SARS-CoV-2 viral test result  Has mild or moderate COVID-19   Is NOT hospitalized due to COVID-19  Is within 10 days of symptom onset  Has at least one of the high risk factor(s) for progression to severe COVID-19 and/or hospitalization as defined in EUA.  Specific high risk criteria : BMI > 25, Immunosuppressive Disease or Treatment and Cardiovascular disease or hypertension   I have spoken and communicated the following to the patient or parent/caregiver regarding COVID monoclonal antibody treatment:  1. FDA has authorized the emergency use for the treatment of mild to moderate COVID-19 in adults and pediatric patients with positive results of direct SARS-CoV-2 viral testing who are 11 years of age and older weighing at least 40 kg, and who are at high risk for progressing to severe COVID-19 and/or hospitalization.  2. The significant known and potential risks and benefits of COVID monoclonal antibody, and the extent to which such potential risks and benefits are unknown.  3. Information on available alternative treatments and the risks and benefits of those alternatives, including clinical trials.  4. Patients treated with COVID monoclonal antibody should continue to self-isolate and use infection control measures (e.g., wear mask, isolate, social distance, avoid sharing personal items, clean and disinfect "high touch" surfaces, and frequent handwashing) according to CDC guidelines.   5. The patient or parent/caregiver has the option to accept or refuse COVID monoclonal antibody treatment.  After reviewing this information with the patient, the  patient has agreed to receive one of the available covid 19 monoclonal antibodies and will be provided an appropriate fact sheet prior to infusion.  Murray Hodgkins, NP 10/28/2020 10:45 AM

## 2020-10-29 ENCOUNTER — Ambulatory Visit (HOSPITAL_COMMUNITY): Payer: 59

## 2020-11-04 ENCOUNTER — Ambulatory Visit: Payer: 59 | Admitting: Physical Therapy

## 2020-11-10 ENCOUNTER — Other Ambulatory Visit: Payer: Self-pay

## 2020-11-10 ENCOUNTER — Ambulatory Visit: Payer: 59 | Attending: Neurosurgery | Admitting: Physical Therapy

## 2020-11-10 DIAGNOSIS — M542 Cervicalgia: Secondary | ICD-10-CM | POA: Diagnosis not present

## 2020-11-10 DIAGNOSIS — M6281 Muscle weakness (generalized): Secondary | ICD-10-CM | POA: Insufficient documentation

## 2020-11-10 NOTE — Therapy (Signed)
[facility] [place], [place], [address] Phone: [phone]   Fax:  [phone]  Physical Therapy Evaluation  Patient Details  Name: Casey [name] MRN: [id number] Date of Birth: 12-16-1960 Referring Provider (PT): Kristeen Miss MD   Encounter Date: 11/10/2020   PT End of Session - 11/10/20 1237    Visit Number 1    Number of Visits 12    Date for PT Re-Evaluation 12/22/20    PT Start Time 1115    PT Stop Time 1200    PT Time Calculation (min) 45 min    Activity Tolerance Patient tolerated treatment well    Behavior During Therapy [facility] for tasks assessed/performed           Past Medical History:  Diagnosis Date   Abnormal Pap smear of cervix    1985   Anemia    Anxiety    Atypical nevus 12/15/2005   Right Scapula-Slight and Right Hip-Moderate to Marked (Exc.)   Atypical nevus 06/06/2013   Left Upper Back-Moderate, and Left Shin-Mild   Atypical nevus 06/20/2018   Right Deltoid-Moderate   BCC (basal cell carcinoma of skin) 06/23/2006   Right Neck   BCC (basal cell carcinoma of skin) 06/20/2018   Right Outerback (tx p bx)   Cancer ([place])    premelanoma per hip    Cancer of appendix ([place])    COVID-19 virus infection 10/2020   Depression    DUB (dysfunctional uterine bleeding)    Dysrhythmia    pt states has PVCs    Gallstones    GERD (gastroesophageal reflux disease)    Graves disease    History of blood transfusion    History of urinary tract infection    last one approx 4 years ago    Hyperlipidemia    Hypertension    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    Migraines    Morbid obesity (HCC)    Ovarian cyst    both ovaries   Palpitations    Pneumonia    hx of had approx 22 years ago    PONV (postoperative nausea and vomiting)    Pulmonary nodule    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 Margaretha Glassing  2 degree DUB     There were no vitals filed for this visit.    Subjective Assessment - 11/10/20 1231    Subjective COVID-19 screen performed prior to patient entering clinic.  The patient presents to the clinic today with c/o neck pain and symptoms into her left UE thta include pain and numbness over the dorsum of her right forearm.  her pain is low today rated at a 2/10 but can become severe especially with increase left UE movment and holding her grandchildren.  Heat makes her feel better.  She states her pain came on suddenly in September for no apparent reason.    Pertinent History Cancer (rsolved), Graves disease, HTN.    How long can you sit comfortably? Varies.    Diagnostic tests MRI.    Patient Stated Goals Have as little pain as possible.  Avoid surgery.    Currently in Pain? Yes    Pain Score 2     Pain Location Neck    Pain Orientation Right;Left    Pain Descriptors / Indicators Aching;Numbness;Shooting              [facility] PT Assessment - 11/10/20 0001      Assessment   Medical Diagnosis Herniated cervical disc.    Referring Provider (PT) Kristeen Miss MD    Onset Date/Surgical Date --   September 2021.     Precautions   Precautions None      Restrictions   Weight Bearing Restrictions No      Balance Screen   Has the patient fallen in the past 6 months No    Has the patient had a decrease in activity level because of a fear of falling?  No     Is the patient reluctant to leave their home because of a fear of falling?  No      Home Environment   Living Environment Private residence      Prior Function   Level of Independence Independent      Posture/Postural Control   Posture/Postural Control Postural limitations    Postural Limitations Rounded Shoulders;Forward head      Deep Tendon Reflexes   DTR Assessment Site --   Decreased left Tricep DTR.     ROM / Strength   AROM / PROM / Strength AROM;Strength      AROM   Overall AROM Comments Full active cervical range of motion.      Strength   Overall Strength Comments Left elbow extension is graded at 4 to 4+/5.  Her left grip is 48# and right (dominant side)= 65 degrees      Palpation   Palpation comment Generally diffuse bilateral cervical paraspinal musculature pain reported and a feeling of "tightness" in her bilateral suboccipital region      Ambulation/Gait   Gait Comments WNL.                      Objective measurements completed on examination: See above findings.       [facility] Adult PT Treatment/Exercise - 11/10/20 0001      Modalities   Modalities Electrical Stimulation;Moist Heat      Moist Heat Therapy   Number Minutes Moist Heat 15 Minutes    Moist Heat Location Cervical      Electrical Stimulation   Electrical Stimulation Location Bilalteral cervical region.    Electrical Stimulation Action IFC    Electrical Stimulation Parameters 80-150 Hz on 40% scan x 15 minutes.    Electrical Stimulation Goals Pain;Tone                               Plan - 11/10/20 1243    Clinical Impression Statement The patient presents to O[facility] with c/o neck and left UE pain and  numbness.  Her active cervical range of motion is WNL. Her left Tricep reflex is decreased and her left elbow extension strength is decreased when contralaterally compared.  She has  c/o numbness over the dorsum of her right forearm.  She has diffuse c/o  bilateral cervical musculature pain.  Patient will benefit from skilled physical therapy intervention to address deficits and pain.    Personal Factors and Comorbidities Comorbidity 1;Comorbidity 2    Comorbidities Cancer (rsolved), Graves disease, HTN.    Examination-Activity Limitations Other    Examination-Participation Restrictions Other    Stability/Clinical Decision Making Evolving/Moderate complexity    Clinical Decision Making Low    Rehab Potential Good    PT Frequency 2x / week    PT Duration 6 weeks    PT Treatment/Interventions ADLs/Self Care Home Management;Cryotherapy;Electrical Stimulation;Ultrasound;Traction;Moist Heat;Therapeutic activities;Therapeutic exercise;Manual techniques;Patient/family education;Passive range of motion;Dry needling    PT Next Visit Plan Combo e'stim/US and STW/M to patient's affected cervical region, intermittment cervical traction beginning at 15#.  Postural exercises.    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

## 2020-11-12 ENCOUNTER — Other Ambulatory Visit: Payer: Self-pay

## 2020-11-12 ENCOUNTER — Ambulatory Visit: Payer: 59 | Admitting: Physical Therapy

## 2020-11-12 ENCOUNTER — Encounter: Payer: Self-pay | Admitting: Physical Therapy

## 2020-11-12 DIAGNOSIS — M6281 Muscle weakness (generalized): Secondary | ICD-10-CM

## 2020-11-12 DIAGNOSIS — M542 Cervicalgia: Secondary | ICD-10-CM

## 2020-11-12 NOTE — Therapy (Signed)
[facility] [address] [place], [place], [address] Phone: [phone]   Fax:  [phone]  Physical Therapy Treatment  Patient Details  Name: Casey [name] MRN: [id number] Date of Birth: 08/30/1961 Referring Provider (PT): Barnett Abu MD   Encounter Date: 11/12/2020   PT End of Session - 11/12/20 0953    Visit Number 2    Number of Visits 12    Date for PT Re-Evaluation 12/22/20    PT Start Time 0953    PT Stop Time 1040    PT Time Calculation (min) 47 min    Activity Tolerance Patient tolerated treatment well    Behavior During Therapy [facility] for tasks assessed/performed           Past Medical History:  Diagnosis Date  . Abnormal Pap smear of cervix    1985  . Anemia   . Anxiety   . Atypical nevus 12/15/2005   Right Scapula-Slight and Right Hip-Moderate to Marked (Exc.)  . Atypical nevus 06/06/2013   Left Upper Back-Moderate, and Left Shin-Mild  . Atypical nevus 06/20/2018   Right Deltoid-Moderate  . BCC (basal cell carcinoma of skin) 06/23/2006   Right Neck  . BCC (basal cell carcinoma of skin) 06/20/2018   Right Outerback (tx p bx)  . Cancer (HCC)    premelanoma per hip   . Cancer of appendix (H[facility])   . COVID-19 virus infection 10/2020  . Depression   . DUB (dysfunctional uterine bleeding)   . Dysrhythmia    pt states has PVCs   . Gallstones   . GERD (gastroesophageal reflux disease)   . Graves disease   . History of blood transfusion   . History of urinary tract infection    last one approx 4 years ago   . Hyperlipidemia   . Hypertension   . 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 Migraines   . Morbid obesity (H[facility])   . Ovarian cyst    both ovaries  . Palpitations   . Pneumonia    hx of had approx 22 years ago   . PONV (postoperative nausea and vomiting)   . Pulmonary nodule   . 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 APPENDECTOMY    . CRYOTHERAPY     for abnormal pap  . DIAGNOSTIC LAPAROSCOPY    . DILATION AND CURETTAGE OF UTERUS    . infertility testing     . LAPAROSCOPIC PARTIAL COLECTOMY N/A 02/02/2016   Procedure: LAPAROSCOPIC PARTIAL COlectomy right colon;  Surgeon: Gaynelle Adu, MD;  Location: [facility];  Service: General;  Laterality: N/A;  . OOPHORECTOMY     & fallopian tubes  . TONSILLECTOMY    . VAGINAL HYSTERECTOMY     total  . VESICOVAGINAL FISTULA CLOSURE W/ TAH     Dr. Myrlene Broker  2 degree DUB     There were no vitals filed for this visit.   Subjective Assessment - 11/12/20 0952    Subjective COVID 19 screening performed on patient upon arrival. Reports greater L shoulder and UE pain. Pain worse with cough or sneeze.    Pertinent History Cancer (rsolved), Graves disease, HTN.    How long can you sit comfortably? Varies.    Diagnostic tests MRI.    Patient Stated Goals Have as little pain as possible.  Avoid surgery.  Currently in Pain? Yes    Pain Score 1     Pain Location Wrist    Pain Orientation Right    Pain Descriptors / Indicators Discomfort    Pain Type Acute pain    Pain Onset More than a month ago    Pain Frequency Intermittent              [facility] PT Assessment - 11/12/20 0001      Assessment   Medical Diagnosis Herniated cervical disc.    Referring Provider (PT) Kristeen Miss MD      Precautions   Precautions None      Restrictions   Weight Bearing Restrictions No                         [facility] Adult PT Treatment/Exercise - 11/12/20 0001      Modalities   Modalities Ultrasound;Traction      Ultrasound   Ultrasound Location L UT    Ultrasound Parameters Combo 1.5 w/cm2, 100%, 1 mhz x10 min    Ultrasound Goals Pain      Traction   Type of Traction Cervical    Min (lbs) 5    Max (lbs) 15    Hold Time 99    Rest Time 5    Time 15      Manual Therapy   Manual Therapy Soft tissue mobilization     Soft tissue mobilization STW to L UT, levator scapula, cervical paraspinals to reduce tone and pain                  PT Education - 11/12/20 1047    Education Details HEP-chin tuck with cervical extension    Person(s) Educated Patient    Methods Handout    Comprehension Verbalized understanding                      Plan - 11/12/20 1222    Clinical Impression Statement Patient presented in clinic with reports of only L wrist discomfort today. Patient has experienced greater pain in the last few days after holding her 24 month old grandchild. Patient did present with mod tightness of L UT, cervical paraspinals. No reports of tenderness during manual therapy. Cervical mechanical traction initiated at 15# max with no complaints via patient. Patient educated to assess symptoms until next visit of frequency and intensity of symptoms. Patient provided handout of chin tuck with cervical extension like was provided in evaluation.    Personal Factors and Comorbidities Comorbidity 1;Comorbidity 2    Comorbidities Cancer (rsolved), Graves disease, HTN.    Examination-Activity Limitations Other    Examination-Participation Restrictions Other    Stability/Clinical Decision Making Evolving/Moderate complexity    Rehab Potential Good    PT Frequency 2x / week    PT Duration 6 weeks    PT Treatment/Interventions ADLs/Self Care Home Management;Cryotherapy;Electrical Stimulation;Ultrasound;Traction;Moist Heat;Therapeutic activities;Therapeutic exercise;Manual techniques;Patient/family education;Passive range of motion;Dry needling    PT Next Visit Plan Combo e'stim/US and STW/M to patient's affected cervical region, intermittment cervical traction beginning at 15#.  Postural exercises.    Consulted and Agree with Plan of Care Patient           Patient will benefit from skilled therapeutic intervention in order to improve the following deficits and impairments:  Pain,Postural  dysfunction,Decreased activity tolerance,Decreased strength  Visit Diagnosis: Cervicalgia  Muscle weakness (generalized)     Problem List Patient Active Problem List   Diagnosis  Date Noted  . Morbid obesity ([place])   . COVID-19 virus infection 10/2020  . BMI 29.0-29.9,adult 04/11/2020  . 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 GERD (gastroesophageal reflux disease) 03/18/2014  . Depression 03/18/2014  . Hypertension 03/28/2013  . Hyperlipidemia 03/28/2013  . Hypothyroidism 03/28/2013  . Graves' disease     Standley Brooking, PTA 11/12/2020, 12:27 PM  [facility] [address] [place], [place], [address] Phone: [phone]   Fax:  [phone]  Name: Casey [name] MRN: [id number] Date of Birth: [birth year]

## 2020-11-17 ENCOUNTER — Ambulatory Visit: Payer: 59 | Admitting: Physical Therapy

## 2020-11-17 ENCOUNTER — Other Ambulatory Visit: Payer: Self-pay

## 2020-11-17 ENCOUNTER — Encounter: Payer: Self-pay | Admitting: Physical Therapy

## 2020-11-17 DIAGNOSIS — M542 Cervicalgia: Secondary | ICD-10-CM

## 2020-11-17 DIAGNOSIS — M6281 Muscle weakness (generalized): Secondary | ICD-10-CM

## 2020-11-17 NOTE — Therapy (Signed)
[facility] [place], [place], [address] Phone: [phone]   Fax:  [phone]  Physical Therapy Treatment  Patient Details  Name: Casey [name] MRN: [id number] Date of Birth: 1961/09/02 Referring Provider (PT): Kristeen Miss MD   Encounter Date: 11/17/2020   PT End of Session - 11/17/20 1027    Visit Number 3    Number of Visits 12    Date for PT Re-Evaluation 12/22/20    PT Start Time 0950    PT Stop Time 1034    PT Time Calculation (min) 44 min    Activity Tolerance Patient tolerated treatment well    Behavior During Therapy [facility] for tasks assessed/performed           Past Medical History:  Diagnosis Date  . Abnormal Pap smear of cervix    1985  . Anemia   . Anxiety   . Atypical nevus 12/15/2005   Right Scapula-Slight and Right Hip-Moderate to Marked (Exc.)  . Atypical nevus 06/06/2013   Left Upper Back-Moderate, and Left Shin-Mild  . Atypical nevus 06/20/2018   Right Deltoid-Moderate  . BCC (basal cell carcinoma of skin) 06/23/2006   Right Neck  . BCC (basal cell carcinoma of skin) 06/20/2018   Right Outerback (tx p bx)  . Cancer ([place])    premelanoma per hip   . Cancer of appendix ([place])   . COVID-19 virus infection 10/2020  . Depression   . DUB (dysfunctional uterine bleeding)   . Dysrhythmia    pt states has PVCs   . Gallstones   . GERD (gastroesophageal reflux disease)   . Graves disease   . History of blood transfusion   . History of urinary tract infection    last one approx 4 years ago   . Hyperlipidemia   . Hypertension   . 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 Migraines   . Morbid obesity ([place])   . Ovarian cyst    both ovaries  . Palpitations   . Pneumonia    hx of had approx 22 years ago   . PONV (postoperative nausea and vomiting)   . Pulmonary nodule   . 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 APPENDECTOMY    . CRYOTHERAPY     for abnormal pap  . DIAGNOSTIC LAPAROSCOPY    . DILATION AND CURETTAGE OF UTERUS    . infertility testing     . LAPAROSCOPIC PARTIAL COLECTOMY N/A 02/02/2016   Procedure: LAPAROSCOPIC PARTIAL COlectomy right colon;  Surgeon: Greer Pickerel, MD;  Location: [facility];  Service: General;  Laterality: N/A;  . OOPHORECTOMY     & fallopian tubes  . TONSILLECTOMY    . VAGINAL HYSTERECTOMY     total  . VESICOVAGINAL FISTULA CLOSURE W/ TAH     Dr. Margaretha Glassing  2 degree DUB     There were no vitals filed for this visit.   Subjective Assessment - 11/17/20 0950    Subjective COVID 19 screening performed on patient upon arrival. Reports more UE irritation since last visit and more numbness R point finger.    Pertinent History Cancer (rsolved), Graves disease, HTN.    How long can you sit comfortably? Varies.    Diagnostic tests MRI.    Patient Stated Goals Have as little pain as possible.  Avoid surgery.  Currently in Pain? Yes    Pain Score --   No pain score provided   Pain Location Finger (Comment which one)   second phalange   Pain Orientation Left    Pain Descriptors / Indicators Numbness    Pain Type Acute pain    Pain Onset More than a month ago    Pain Frequency Intermittent                             [facility] Adult PT Treatment/Exercise - 11/17/20 0001      Modalities   Modalities Traction;Ultrasound      Ultrasound   Ultrasound Location L UT/cervical paraspinals    Ultrasound Parameters Combo 1.5 w/cm2, 100%, 1 mhz x45min    Ultrasound Goals Pain      Traction   Type of Traction Cervical    Min (lbs) 5    Max (lbs) 15    Hold Time 99    Rest Time 5    Time 15      Manual Therapy   Manual Therapy Soft tissue mobilization    Soft tissue mobilization STW to L UT, levator scapula, cervical paraspinals to reduce tone and pain                       PT Long Term Goals -  11/12/20 1234      PT LONG TERM GOAL #1   Title Independent with a HEP.    Time 6    Period Weeks    Status New      PT LONG TERM GOAL #2   Title Perform ADL's with pain not > 2-3/10.    Time 6    Period Weeks    Status New      PT LONG TERM GOAL #3   Title Eliminate UE symproms.    Time 6    Period Weeks    Status New                 Plan - 11/17/20 1040    Clinical Impression Statement Patient presented in clinic with reports of continued LUE symptoms but also have some tingling of posterior UEs and R pointer finger as well. Patient reported that her neck felt "tight" today upon arrival. Patient had no reports of tenderness during manual therapy session. Mechanical cervical traction continued today to further assess symptoms.    Personal Factors and Comorbidities Comorbidity 1;Comorbidity 2    Comorbidities Cancer (rsolved), Graves disease, HTN.    Examination-Activity Limitations Other    Examination-Participation Restrictions Other    Stability/Clinical Decision Making Evolving/Moderate complexity    Rehab Potential Good    PT Frequency 2x / week    PT Duration 6 weeks    PT Treatment/Interventions ADLs/Self Care Home Management;Cryotherapy;Electrical Stimulation;Ultrasound;Traction;Moist Heat;Therapeutic activities;Therapeutic exercise;Manual techniques;Patient/family education;Passive range of motion;Dry needling    PT Next Visit Plan Combo e'stim/US and STW/M to patient's affected cervical region, intermittment cervical traction beginning at 15#.  Postural exercises.    Consulted and Agree with Plan of Care Patient           Patient will benefit from skilled therapeutic intervention in order to improve the following deficits and impairments:  Pain,Postural dysfunction,Decreased activity tolerance,Decreased strength  Visit Diagnosis: Cervicalgia  Muscle weakness (generalized)     Problem List Patient Active Problem List   Diagnosis Date Noted  .  Morbid obesity ([facility])   .  COVID-19 virus infection 10/2020  . BMI 29.0-29.9,adult 04/11/2020  . 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 GERD (gastroesophageal reflux disease) 03/18/2014  . Depression 03/18/2014  . Hypertension 03/28/2013  . Hyperlipidemia 03/28/2013  . Hypothyroidism 03/28/2013  . Graves' disease     Standley Brooking, PTA 11/17/2020, 10:50 AM  [facility] [address] [place], [place], [address] Phone: [phone]   Fax:  [phone]  Name: Casey [name] MRN: [id number] Date of Birth: 13-Mar-1961

## 2020-11-18 ENCOUNTER — Encounter: Payer: Self-pay | Admitting: Physical Therapy

## 2020-11-18 ENCOUNTER — Other Ambulatory Visit: Payer: Self-pay

## 2020-11-18 ENCOUNTER — Ambulatory Visit: Payer: 59 | Admitting: Physical Therapy

## 2020-11-18 DIAGNOSIS — M542 Cervicalgia: Secondary | ICD-10-CM | POA: Diagnosis not present

## 2020-11-18 DIAGNOSIS — M6281 Muscle weakness (generalized): Secondary | ICD-10-CM

## 2020-11-18 NOTE — Therapy (Signed)
[facility] [place], [place], [address] Phone: [phone]   Fax:  [phone]  Physical Therapy Treatment  Patient Details  Name: Casey [name] MRN: [id number] Date of Birth: 03/16/1961 Referring Provider (PT): Kristeen Miss MD   Encounter Date: 11/18/2020   PT End of Session - 11/18/20 1420    Visit Number 4    Number of Visits 12    Date for PT Re-Evaluation 12/22/20    PT Start Time 1030    PT Stop Time 1120    PT Time Calculation (min) 50 min    Activity Tolerance Patient tolerated treatment well    Behavior During Therapy [facility] for tasks assessed/performed           Past Medical History:  Diagnosis Date   Abnormal Pap smear of cervix    1985   Anemia    Anxiety    Atypical nevus 12/15/2005   Right Scapula-Slight and Right Hip-Moderate to Marked (Exc.)   Atypical nevus 06/06/2013   Left Upper Back-Moderate, and Left Shin-Mild   Atypical nevus 06/20/2018   Right Deltoid-Moderate   BCC (basal cell carcinoma of skin) 06/23/2006   Right Neck   BCC (basal cell carcinoma of skin) 06/20/2018   Right Outerback (tx p bx)   Cancer ([place])    premelanoma per hip    Cancer of appendix ([place])    COVID-19 virus infection 10/2020   Depression    DUB (dysfunctional uterine bleeding)    Dysrhythmia    pt states has PVCs    Gallstones    GERD (gastroesophageal reflux disease)    Graves disease    History of blood transfusion    History of urinary tract infection    last one approx 4 years ago    Hyperlipidemia    Hypertension    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    Migraines    Morbid obesity (HCC)    Ovarian cyst    both ovaries   Palpitations    Pneumonia    hx of had approx 22 years ago    PONV (postoperative nausea and vomiting)    Pulmonary nodule    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 Margaretha Glassing  2 degree DUB     There were no vitals filed for this visit.   Subjective Assessment - 11/18/20 1419    Subjective COVID 19 screening performed on patient upon arrival. Patient arrives with 1/10 pain but feeling stiff.    Pertinent History Cancer (rsolved), Graves disease, HTN.    How long can you sit comfortably? Varies.    Diagnostic tests MRI.    Patient Stated Goals Have as little pain as possible.  Avoid surgery.    Currently in Pain? Yes  Pain Score 1     Pain Location Neck    Pain Orientation Lower    Pain Descriptors / Indicators Tightness    Pain Type Acute pain    Pain Onset More than a month ago    Pain Frequency Intermittent              [facility] PT Assessment - 11/18/20 0001      Assessment   Medical Diagnosis Herniated cervical disc.    Referring Provider (PT) Barnett Abu MD      Precautions   Precautions None      Restrictions   Weight Bearing Restrictions No                         [facility] Adult PT Treatment/Exercise - 11/18/20 0001      Ultrasound   Ultrasound Location bilateral UTs    Ultrasound Parameters combo US/E-stim 100% 1.5 w/cm2 1 mhz x10 mins    Ultrasound Goals Pain      Traction   Type of Traction Cervical    Min (lbs) 5    Max (lbs) 15    Hold Time 99    Rest Time 5    Time 15      Manual Therapy   Manual Therapy Soft tissue mobilization    Soft tissue mobilization STW to bilateral UT, levator  scapula, cervical paraspinals to reduce tone and pain; TPR to UTs                       PT Long Term Goals - 11/12/20 1234      PT LONG TERM GOAL #1   Title Independent with a HEP.    Time 6    Period Weeks    Status New      PT LONG TERM GOAL #2   Title Perform ADL's with pain not > 2-3/10.    Time 6    Period Weeks    Status New      PT LONG TERM GOAL #3   Title Eliminate UE symproms.    Time 6    Period Weeks    Status New                 Plan - 11/18/20 1421    Clinical Impression Statement Patient arrives with low levels of lower cervical pain as her chief complaint. Patient responded well to combo E-stim/US. Increased tone noted in bilateral UTs with multiple trigger points throughout muscle belly. TPR performed with good release. Mechanical traction maintained at 15# with no adverse affects.    Personal Factors and Comorbidities Comorbidity 1;Comorbidity 2    Comorbidities Cancer (rsolved), Graves disease, HTN.    Examination-Activity Limitations Other    Examination-Participation Restrictions Other    Stability/Clinical Decision Making Evolving/Moderate complexity    Clinical Decision Making Low    Rehab Potential Good    PT Frequency 2x / week    PT Duration 6 weeks    PT Treatment/Interventions ADLs/Self Care Home Management;Cryotherapy;Electrical Stimulation;Ultrasound;Traction;Moist Heat;Therapeutic activities;Therapeutic exercise;Manual techniques;Patient/family education;Passive range of motion;Dry needling    PT Next Visit Plan Assess traction increase if no adverse affects; Combo e'stim/US and STW/M to patient's affected cervical region, intermittment cervical traction beginning at 15#.  Postural exercises.    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 [place], [place], [address] Phone: [phone]   Fax:  [phone]  Name: Casey [name] MRN: [id number] Date of Birth: [birth year]

## 2020-11-19 ENCOUNTER — Encounter: Payer: 59 | Admitting: Physical Therapy

## 2020-11-25 ENCOUNTER — Other Ambulatory Visit: Payer: Self-pay

## 2020-11-25 ENCOUNTER — Other Ambulatory Visit: Payer: Self-pay | Admitting: Nurse Practitioner

## 2020-11-25 ENCOUNTER — Ambulatory Visit: Payer: 59 | Attending: Neurosurgery | Admitting: Physical Therapy

## 2020-11-25 DIAGNOSIS — M6281 Muscle weakness (generalized): Secondary | ICD-10-CM | POA: Insufficient documentation

## 2020-11-25 DIAGNOSIS — M542 Cervicalgia: Secondary | ICD-10-CM | POA: Insufficient documentation

## 2020-11-25 MED ORDER — CYANOCOBALAMIN 1000 MCG/ML IJ SOLN: INTRAMUSCULAR | 1 refills | Status: AC

## 2020-11-25 NOTE — Therapy (Signed)
[facility] [address] [place], [place], [address] Phone: [phone]   Fax:  [phone]  Physical Therapy Treatment  Patient Details  Name: Casey [name] MRN: [id number] Date of Birth: 02-20-1961 Referring Provider (PT): Barnett Abu MD   Encounter Date: 11/25/2020   PT End of Session - 11/25/20 1635    Visit Number 5    Number of Visits 12    Date for PT Re-Evaluation 12/22/20    PT Start Time 0358    PT Stop Time 0447    PT Time Calculation (min) 49 min           Past Medical History:  Diagnosis Date  . Abnormal Pap smear of cervix    1985  . Anemia   . Anxiety   . Atypical nevus 12/15/2005   Right Scapula-Slight and Right Hip-Moderate to Marked (Exc.)  . Atypical nevus 06/06/2013   Left Upper Back-Moderate, and Left Shin-Mild  . Atypical nevus 06/20/2018   Right Deltoid-Moderate  . BCC (basal cell carcinoma of skin) 06/23/2006   Right Neck  . BCC (basal cell carcinoma of skin) 06/20/2018   Right Outerback (tx p bx)  . Cancer (HCC)    premelanoma per hip   . Cancer of appendix (HCC)   . COVID-19 virus infection 10/2020  . Depression   . DUB (dysfunctional uterine bleeding)   . Dysrhythmia    pt states has PVCs   . Gallstones   . GERD (gastroesophageal reflux disease)   . Graves disease   . History of blood transfusion   . History of urinary tract infection    last one approx 4 years ago   . Hyperlipidemia   . Hypertension   . 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 Migraines   . Morbid obesity (HCC)   . Ovarian cyst    both ovaries  . Palpitations   . Pneumonia    hx of had approx 22 years ago   . PONV (postoperative nausea and vomiting)   . Pulmonary nodule   . 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 APPENDECTOMY    . CRYOTHERAPY     for abnormal pap  . DIAGNOSTIC LAPAROSCOPY     . DILATION AND CURETTAGE OF UTERUS    . infertility testing     . LAPAROSCOPIC PARTIAL COLECTOMY N/A 02/02/2016   Procedure: LAPAROSCOPIC PARTIAL COlectomy right colon;  Surgeon: Gaynelle Adu, MD;  Location: [facility];  Service: General;  Laterality: N/A;  . OOPHORECTOMY     & fallopian tubes  . TONSILLECTOMY    . VAGINAL HYSTERECTOMY     total  . VESICOVAGINAL FISTULA CLOSURE W/ TAH     Dr. Myrlene Broker  2 degree DUB     There were no vitals filed for this visit.   Subjective Assessment - 11/25/20 1630    Subjective COVID-19 screen performed prior to patient entering clinic.  Pain around a 3.    Pertinent History Cancer (rsolved), Graves disease, HTN.    How long can you sit comfortably? Varies.    Diagnostic tests MRI.    Patient Stated Goals Have as little pain as possible.  Avoid surgery.                             [facility]  Adult PT Treatment/Exercise - 11/25/20 0001      Modalities   Modalities Ultrasound;Traction      Ultrasound   Ultrasound Location Bil C-spine musculature.    Ultrasound Parameters In prone:  Combo e'stim/US      Traction   Type of Traction Cervical    Min (lbs) 5    Max (lbs) 17    Hold Time 99    Rest Time 5    Time 15      Manual Therapy   Manual Therapy Soft tissue mobilization    Soft tissue mobilization In prone:  STW/M x 11 minutes to patient's bilateral cervical paraspinal musculature and UT's x 11 minutes.                       PT Long Term Goals - 11/12/20 1234      PT LONG TERM GOAL #1   Title Independent with a HEP.    Time 6    Period Weeks    Status New      PT LONG TERM GOAL #2   Title Perform ADL's with pain not > 2-3/10.    Time 6    Period Weeks    Status New      PT LONG TERM GOAL #3   Title Eliminate UE symproms.    Time 6    Period Weeks    Status New                 Plan - 11/25/20 1728    Clinical Impression Statement Patient did very well with a 2# increase in  cervical traction and felt better following treatment.    Personal Factors and Comorbidities Comorbidity 1;Comorbidity 2    Comorbidities Cancer (rsolved), Graves disease, HTN.    Examination-Activity Limitations Other    Examination-Participation Restrictions Other    Stability/Clinical Decision Making Evolving/Moderate complexity    Rehab Potential Good    PT Frequency 2x / week    PT Duration 6 weeks    PT Treatment/Interventions ADLs/Self Care Home Management;Cryotherapy;Electrical Stimulation;Ultrasound;Traction;Moist Heat;Therapeutic activities;Therapeutic exercise;Manual techniques;Patient/family education;Passive range of motion;Dry needling    PT Next Visit Plan Assess traction increase if no adverse affects; Combo e'stim/US and STW/M to patient's affected cervical region, intermittment cervical traction beginning at 15#.  Postural exercises.    Consulted and Agree with Plan of Care Patient           Patient will benefit from skilled therapeutic intervention in order to improve the following deficits and impairments:  Pain,Postural dysfunction,Decreased activity tolerance,Decreased strength  Visit Diagnosis: Cervicalgia  Muscle weakness (generalized)     Problem List Patient Active Problem List   Diagnosis Date Noted  . Morbid obesity ([place])   . COVID-19 virus infection 10/2020  . BMI 29.0-29.9,adult 04/11/2020  . 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 GERD (gastroesophageal reflux disease) 03/18/2014  . Depression 03/18/2014  . Hypertension 03/28/2013  . Hyperlipidemia 03/28/2013  . Hypothyroidism 03/28/2013  . Graves' disease     Satori Krabill, [place] MPT 11/25/2020, 5:34 PM  [facility] [address] [place], [place], [address] Phone: [phone]   Fax:  [phone]  Name: ZORIANA LIMBAUGH MRN: [id number] Date of Birth: Oct 08, 1961

## 2020-11-27 ENCOUNTER — Encounter: Payer: Self-pay | Admitting: Physical Therapy

## 2020-11-27 ENCOUNTER — Ambulatory Visit: Payer: 59 | Admitting: Physical Therapy

## 2020-11-27 ENCOUNTER — Other Ambulatory Visit: Payer: Self-pay

## 2020-11-27 DIAGNOSIS — M542 Cervicalgia: Secondary | ICD-10-CM | POA: Diagnosis not present

## 2020-11-27 DIAGNOSIS — M6281 Muscle weakness (generalized): Secondary | ICD-10-CM

## 2020-11-27 NOTE — Therapy (Signed)
[facility] [place], [place], [address] Phone: [phone]   Fax:  [phone]  Physical Therapy Treatment  Patient Details  Name: Casey [name] MRN: [id number] Date of Birth: 04-Jul-1961 Referring Provider (PT): Kristeen Miss MD   Encounter Date: 11/27/2020   PT End of Session - 11/27/20 1602    Visit Number 6    Number of Visits 12    Date for PT Re-Evaluation 12/22/20    PT Start Time 1602    PT Stop Time 1647    PT Time Calculation (min) 45 min    Activity Tolerance Patient tolerated treatment well    Behavior During Therapy [facility] for tasks assessed/performed           Past Medical History:  Diagnosis Date  . Abnormal Pap smear of cervix    1985  . Anemia   . Anxiety   . Atypical nevus 12/15/2005   Right Scapula-Slight and Right Hip-Moderate to Marked (Exc.)  . Atypical nevus 06/06/2013   Left Upper Back-Moderate, and Left Shin-Mild  . Atypical nevus 06/20/2018   Right Deltoid-Moderate  . BCC (basal cell carcinoma of skin) 06/23/2006   Right Neck  . BCC (basal cell carcinoma of skin) 06/20/2018   Right Outerback (tx p bx)  . Cancer ([place])    premelanoma per hip   . Cancer of appendix ([place])   . COVID-19 virus infection 10/2020  . Depression   . DUB (dysfunctional uterine bleeding)   . Dysrhythmia    pt states has PVCs   . Gallstones   . GERD (gastroesophageal reflux disease)   . Graves disease   . History of blood transfusion   . History of urinary tract infection    last one approx 4 years ago   . Hyperlipidemia   . Hypertension   . 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 Migraines   . Morbid obesity ([place])   . Ovarian cyst    both ovaries  . Palpitations   . Pneumonia    hx of had approx 22 years ago   . PONV (postoperative nausea and vomiting)   . Pulmonary nodule   . 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 APPENDECTOMY    . CRYOTHERAPY     for abnormal pap  . DIAGNOSTIC LAPAROSCOPY    . DILATION AND CURETTAGE OF UTERUS    . infertility testing     . LAPAROSCOPIC PARTIAL COLECTOMY N/A 02/02/2016   Procedure: LAPAROSCOPIC PARTIAL COlectomy right colon;  Surgeon: Greer Pickerel, MD;  Location: [facility];  Service: General;  Laterality: N/A;  . OOPHORECTOMY     & fallopian tubes  . TONSILLECTOMY    . VAGINAL HYSTERECTOMY     total  . VESICOVAGINAL FISTULA CLOSURE W/ TAH     Dr. Margaretha Glassing  2 degree DUB     There were no vitals filed for this visit.   Subjective Assessment - 11/27/20 1601    Subjective COVID-19 screen performed prior to patient entering clinic. Reports stiffness where she has been working but bought a riser for her computer. Looking down aggraves cervical pain.    Pertinent History Cancer (rsolved), Graves disease, HTN.    How long can you sit comfortably? Varies.    Diagnostic tests MRI.    Patient Stated Goals Have as little pain as  possible.  Avoid surgery.    Currently in Pain? Yes    Pain Score 3     Pain Location Neck    Pain Orientation Left    Pain Descriptors / Indicators Tightness;Discomfort    Pain Type Acute pain    Pain Onset More than a month ago    Pain Frequency Intermittent                             [facility] Adult PT Treatment/Exercise - 11/27/20 0001      Ultrasound   Ultrasound Location L UT, cervical paraspinals    Ultrasound Parameters Combo 1.5 w/cm2, 100%, 1 mhz x10 min    Ultrasound Goals Pain      Traction   Type of Traction Cervical    Min (lbs) 5    Max (lbs) 17    Hold Time 99    Rest Time 5    Time 15      Manual Therapy   Manual Therapy Soft tissue mobilization    Soft tissue mobilization STW to L UT, levator scapula, cervical paraspinals to reduce tone and stiffness                       PT Long Term Goals - 11/12/20 1234      PT LONG TERM GOAL #1    Title Independent with a HEP.    Time 6    Period Weeks    Status New      PT LONG TERM GOAL #2   Title Perform ADL's with pain not > 2-3/10.    Time 6    Period Weeks    Status New      PT LONG TERM GOAL #3   Title Eliminate UE symproms.    Time 6    Period Weeks    Status New                 Plan - 11/27/20 1639    Clinical Impression Statement Patient presented in clinic with reports of tightness and stiffness specifically in L cervical musculature. Patient has improved ergonomics of workspace as she ordered a computer riser for her desk. Looking down continues to exaggerate cervical pain. Patient reporting less L wrist pain but has experienced L second phalange being numb. Patient did present with mod tone of L UT and cervical paraspinals. Mechanical cervical traction continued at 17# max as patient denied any negetive reaction after increase in last PT session.    Personal Factors and Comorbidities Comorbidity 1;Comorbidity 2    Comorbidities Cancer (rsolved), Graves disease, HTN.    Examination-Activity Limitations Other    Examination-Participation Restrictions Other    Stability/Clinical Decision Making Evolving/Moderate complexity    Rehab Potential Good    PT Frequency 2x / week    PT Duration 6 weeks    PT Treatment/Interventions ADLs/Self Care Home Management;Cryotherapy;Electrical Stimulation;Ultrasound;Traction;Moist Heat;Therapeutic activities;Therapeutic exercise;Manual techniques;Patient/family education;Passive range of motion;Dry needling    PT Next Visit Plan Assess traction increase if no adverse affects; Combo e'stim/US and STW/M to patient's affected cervical region, intermittment cervical traction beginning at 15#.  Postural exercises.    Consulted and Agree with Plan of Care Patient           Patient will benefit from skilled therapeutic intervention in order to improve the following deficits and impairments:  Pain,Postural dysfunction,Decreased  activity tolerance,Decreased strength  Visit Diagnosis: Cervicalgia  Muscle  weakness (generalized)     Problem List Patient Active Problem List   Diagnosis Date Noted  . Morbid obesity (HCC)   . COVID-19 virus infection 10/2020  . BMI 29.0-29.9,adult 04/11/2020  . 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 GERD (gastroesophageal reflux disease) 03/18/2014  . Depression 03/18/2014  . Hypertension 03/28/2013  . Hyperlipidemia 03/28/2013  . Hypothyroidism 03/28/2013  . Graves' disease     Marvell Fuller, PTA 11/27/2020, 4:52 PM  [facility] [address] [place], [place], [address] Phone: [phone]   Fax:  [phone]  Name: YERALDI FIDLER MRN: [id number] Date of Birth: 09-23-1961

## 2020-12-02 ENCOUNTER — Other Ambulatory Visit: Payer: Self-pay

## 2020-12-02 ENCOUNTER — Encounter: Payer: Self-pay | Admitting: Physical Therapy

## 2020-12-02 ENCOUNTER — Ambulatory Visit: Payer: 59 | Admitting: Physical Therapy

## 2020-12-02 DIAGNOSIS — M542 Cervicalgia: Secondary | ICD-10-CM | POA: Diagnosis not present

## 2020-12-02 DIAGNOSIS — M6281 Muscle weakness (generalized): Secondary | ICD-10-CM

## 2020-12-02 NOTE — Therapy (Signed)
[facility] [place], [place], [address] Phone: [phone]   Fax:  [phone]  Physical Therapy Treatment  Patient Details  Name: Casey [name] MRN: [id number] Date of Birth: November 02, 1961 Referring Provider (PT): Kristeen Miss MD   Encounter Date: 12/02/2020   PT End of Session - 12/02/20 1557    Visit Number 7    Number of Visits 12    Date for PT Re-Evaluation 12/22/20    PT Start Time 0347    PT Stop Time 1643    PT Time Calculation (min) 46 min    Activity Tolerance Patient tolerated treatment well    Behavior During Therapy [facility] for tasks assessed/performed           Past Medical History:  Diagnosis Date  . Abnormal Pap smear of cervix    1985  . Anemia   . Anxiety   . Atypical nevus 12/15/2005   Right Scapula-Slight and Right Hip-Moderate to Marked (Exc.)  . Atypical nevus 06/06/2013   Left Upper Back-Moderate, and Left Shin-Mild  . Atypical nevus 06/20/2018   Right Deltoid-Moderate  . BCC (basal cell carcinoma of skin) 06/23/2006   Right Neck  . BCC (basal cell carcinoma of skin) 06/20/2018   Right Outerback (tx p bx)  . Cancer ([place])    premelanoma per hip   . Cancer of appendix ([place])   . COVID-19 virus infection 10/2020  . Depression   . DUB (dysfunctional uterine bleeding)   . Dysrhythmia    pt states has PVCs   . Gallstones   . GERD (gastroesophageal reflux disease)   . Graves disease   . History of blood transfusion   . History of urinary tract infection    last one approx 4 years ago   . Hyperlipidemia   . Hypertension   . 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 Migraines   . Morbid obesity ([place])   . Ovarian cyst    both ovaries  . Palpitations   . Pneumonia    hx of had approx 22 years ago   . PONV (postoperative nausea and vomiting)   . Pulmonary nodule   . 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 APPENDECTOMY    . CRYOTHERAPY     for abnormal pap  . DIAGNOSTIC LAPAROSCOPY    . DILATION AND CURETTAGE OF UTERUS    . infertility testing     . LAPAROSCOPIC PARTIAL COLECTOMY N/A 02/02/2016   Procedure: LAPAROSCOPIC PARTIAL COlectomy right colon;  Surgeon: Greer Pickerel, MD;  Location: [facility];  Service: General;  Laterality: N/A;  . OOPHORECTOMY     & fallopian tubes  . TONSILLECTOMY    . VAGINAL HYSTERECTOMY     total  . VESICOVAGINAL FISTULA CLOSURE W/ TAH     Dr. Margaretha Glassing  2 degree DUB     There were no vitals filed for this visit.   Subjective Assessment - 12/02/20 1555    Subjective COVID 19 screening performed on patient upon arrival. Patient reports greater L pointer finger numbness today intermittantly. Patient reports that she held her grandkids a lot over the weekend and that always exagerrates pain.    Pertinent History Cancer (rsolved), Graves disease, HTN.    How long can you sit comfortably? Varies.    Diagnostic tests MRI.    Patient  Stated Goals Have as little pain as possible.  Avoid surgery.    Currently in Pain? Yes    Pain Score 1     Pain Location Neck    Pain Orientation Right;Left    Pain Descriptors / Indicators Discomfort    Pain Type Acute pain    Pain Onset More than a month ago    Pain Frequency Intermittent              [facility] PT Assessment - 12/02/20 0001      Assessment   Medical Diagnosis Herniated cervical disc.    Referring Provider (PT) Barnett Abu MD    Next MD Visit 01/2021                         [facility] Adult PT Treatment/Exercise - 12/02/20 0001      Modalities   Modalities Ultrasound;Traction      Ultrasound   Ultrasound Location L UT, cervical paraspinals    Ultrasound Parameters Combo 1.5 w/cm2, 100%, x10 min    Ultrasound Goals Pain      Traction   Type of Traction Cervical    Min (lbs) 5    Max (lbs) 19    Hold Time 99    Rest Time 5    Time 15       Manual Therapy   Manual Therapy Soft tissue mobilization    Soft tissue mobilization STW to B UT, levator scapula, cervical paraspinals to reduce tone and stiffness                       PT Long Term Goals - 12/02/20 1638      PT LONG TERM GOAL #1   Title Independent with a HEP.    Time 6    Period Weeks    Status Achieved      PT LONG TERM GOAL #2   Title Perform ADL's with pain not > 2-3/10.    Time 6    Period Weeks    Status On-going   Reports pain is greater with OH activities     PT LONG TERM GOAL #3   Title Eliminate UE symproms.    Time 6    Period Weeks    Status On-going                 Plan - 12/02/20 1632    Clinical Impression Statement Patient presented in clinic with reports of greater reports of L pointer finger numbness and of tightness in cervical musculature after holding her grandchildren. Patient presented with tone of B UT tightness especially in superior fibers from prolonged holding. Patient reports greatest exaggerating factors would be holding her grandchildren especially the 37 month old or ADLs with OH motion. Mechanical traction increased to 19# max.    Personal Factors and Comorbidities Comorbidity 1;Comorbidity 2    Comorbidities Cancer (rsolved), Graves disease, HTN.    Examination-Activity Limitations Other    Examination-Participation Restrictions Other    Stability/Clinical Decision Making Evolving/Moderate complexity    Rehab Potential Good    PT Frequency 2x / week    PT Duration 6 weeks    PT Treatment/Interventions ADLs/Self Care Home Management;Cryotherapy;Electrical Stimulation;Ultrasound;Traction;Moist Heat;Therapeutic activities;Therapeutic exercise;Manual techniques;Patient/family education;Passive range of motion;Dry needling    PT Next Visit Plan Assess traction increase if no adverse affects; Combo e'stim/US and STW/M to patient's affected cervical region, intermittment cervical traction beginning at 15#.  Postural exercises.    Consulted and Agree with Plan of Care Patient           Patient will benefit from skilled therapeutic intervention in order to improve the following deficits and impairments:  Pain,Postural dysfunction,Decreased activity tolerance,Decreased strength  Visit Diagnosis: Cervicalgia  Muscle weakness (generalized)     Problem List Patient Active Problem List   Diagnosis Date Noted  . Morbid obesity ([place])   . COVID-19 virus infection 10/2020  . BMI 29.0-29.9,adult 04/11/2020  . 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 GERD (gastroesophageal reflux disease) 03/18/2014  . Depression 03/18/2014  . Hypertension 03/28/2013  . Hyperlipidemia 03/28/2013  . Hypothyroidism 03/28/2013  . Graves' disease     Standley Brooking, PTA 12/02/2020, 4:46 PM  [facility] [address] [place], [place], [address] Phone: [phone]   Fax:  [phone]  Name: Casey [name] MRN: [id number] Date of Birth: 1960/12/14

## 2020-12-04 ENCOUNTER — Other Ambulatory Visit: Payer: Self-pay

## 2020-12-04 ENCOUNTER — Encounter: Payer: Self-pay | Admitting: Physical Therapy

## 2020-12-04 ENCOUNTER — Ambulatory Visit: Payer: 59 | Admitting: Physical Therapy

## 2020-12-04 DIAGNOSIS — M542 Cervicalgia: Secondary | ICD-10-CM

## 2020-12-04 DIAGNOSIS — M6281 Muscle weakness (generalized): Secondary | ICD-10-CM

## 2020-12-04 NOTE — Therapy (Signed)
[facility] [place], [place], [address] Phone: [phone]   Fax:  [phone]  Physical Therapy Treatment  Patient Details  Name: Casey [name] MRN: [id number] Date of Birth: [birth year] Referring Provider (PT): Kristeen Miss MD   Encounter Date: 12/04/2020   PT End of Session - 12/04/20 1603    Visit Number 8    Number of Visits 12    Date for PT Re-Evaluation 12/22/20    PT Start Time 1602    PT Stop Time 1652    PT Time Calculation (min) 50 min    Activity Tolerance Patient tolerated treatment well    Behavior During Therapy [facility] for tasks assessed/performed           Past Medical History:  Diagnosis Date  . Abnormal Pap smear of cervix    1985  . Anemia   . Anxiety   . Atypical nevus 12/15/2005   Right Scapula-Slight and Right Hip-Moderate to Marked (Exc.)  . Atypical nevus 06/06/2013   Left Upper Back-Moderate, and Left Shin-Mild  . Atypical nevus 06/20/2018   Right Deltoid-Moderate  . BCC (basal cell carcinoma of skin) 06/23/2006   Right Neck  . BCC (basal cell carcinoma of skin) 06/20/2018   Right Outerback (tx p bx)  . Cancer ([place])    premelanoma per hip   . Cancer of appendix ([place])   . COVID-19 virus infection 10/2020  . Depression   . DUB (dysfunctional uterine bleeding)   . Dysrhythmia    pt states has PVCs   . Gallstones   . GERD (gastroesophageal reflux disease)   . Graves disease   . History of blood transfusion   . History of urinary tract infection    last one approx 4 years ago   . Hyperlipidemia   . Hypertension   . 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 Migraines   . Morbid obesity ([place])   . Ovarian cyst    both ovaries  . Palpitations   . Pneumonia    hx of had approx 22 years ago   . PONV (postoperative nausea and vomiting)   . Pulmonary nodule   . 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 APPENDECTOMY    . CRYOTHERAPY     for abnormal pap  . DIAGNOSTIC LAPAROSCOPY    . DILATION AND CURETTAGE OF UTERUS    . infertility testing     . LAPAROSCOPIC PARTIAL COLECTOMY N/A 02/02/2016   Procedure: LAPAROSCOPIC PARTIAL COlectomy right colon;  Surgeon: Greer Pickerel, MD;  Location: [facility];  Service: General;  Laterality: N/A;  . OOPHORECTOMY     & fallopian tubes  . TONSILLECTOMY    . VAGINAL HYSTERECTOMY     total  . VESICOVAGINAL FISTULA CLOSURE W/ TAH     Dr. Margaretha Glassing  2 degree DUB     There were no vitals filed for this visit.   Subjective Assessment - 12/04/20 1602    Subjective COVID 19 screening performed on patient upon arrival. Reports that prolonged activities such as drying her hair hurts and has to bend over to assist with pain. Patient reports that earlier she was having more L wrist pain but none currently.    Pertinent History Cancer (rsolved), Graves disease, HTN.    How long can you sit comfortably? Varies.  Diagnostic tests MRI.    Patient Stated Goals Have as little pain as possible.  Avoid surgery.    Currently in Pain? Yes    Pain Score 2     Pain Location Neck    Pain Orientation Left;Right    Pain Descriptors / Indicators Discomfort    Pain Type Acute pain    Pain Radiating Towards L wrist    Pain Onset More than a month ago    Pain Frequency Intermittent              [facility] PT Assessment - 12/04/20 0001      Assessment   Medical Diagnosis Herniated cervical disc.    Referring Provider (PT) Kristeen Miss MD    Next MD Visit 01/2021      Precautions   Precautions None      Restrictions   Weight Bearing Restrictions No                         [facility] Adult PT Treatment/Exercise - 12/04/20 0001      Exercises   Exercises Shoulder      Shoulder Exercises: Standing   Row Strengthening;Both;20 reps;Limitations    Row Limitations Blue XTS; reported shoulder weakness      Shoulder  Exercises: ROM/Strengthening   UBE (Upper Arm Bike) 90 RPM x6 min (forward/backward)    Wall Pushups 20 reps    "W" Arms x15 reps    X to V Arms x15 reps    Other ROM/Strengthening Exercises Snow angels x20 reps      Modalities   Modalities Traction      Traction   Type of Traction Cervical    Min (lbs) 5    Max (lbs) 19    Hold Time 99    Rest Time 5    Time 15      Manual Therapy   Manual Therapy Soft tissue mobilization    Soft tissue mobilization STW to L UT, levator scapula, cervical paraspinals to reduce tone and stiffness                       PT Long Term Goals - 12/02/20 1638      PT LONG TERM GOAL #1   Title Independent with a HEP.    Time 6    Period Weeks    Status Achieved      PT LONG TERM GOAL #2   Title Perform ADL's with pain not > 2-3/10.    Time 6    Period Weeks    Status On-going   Reports pain is greater with OH activities     PT LONG TERM GOAL #3   Title Eliminate UE symproms.    Time 6    Period Weeks    Status On-going                 Plan - 12/04/20 1646    Clinical Impression Statement Patient presented in clinic with reports of minimal pain but experienced greater L wrist pain earlier today. Wrist pain and other radicular symptoms is based upon patient's activities per patient report. Patient was guided through light postural strengthening and awareness exercises. Patient did report LUE weakness predominately during therex and tingling following UBE. Proper posture was instructed via VCs and demo but also with exercises completed at wall. Mod tone of L UT noted during manual therapy. Mechanical traction continued again at 19# max at  newer edition traction table per patient preference and comfort.    Personal Factors and Comorbidities Comorbidity 1;Comorbidity 2    Comorbidities Cancer (rsolved), Graves disease, HTN.    Examination-Activity Limitations Other    Examination-Participation Restrictions Other     Stability/Clinical Decision Making Evolving/Moderate complexity    Rehab Potential Good    PT Frequency 2x / week    PT Duration 6 weeks    PT Treatment/Interventions ADLs/Self Care Home Management;Cryotherapy;Electrical Stimulation;Ultrasound;Traction;Moist Heat;Therapeutic activities;Therapeutic exercise;Manual techniques;Patient/family education;Passive range of motion;Dry needling    PT Next Visit Plan Assess traction increase if no adverse affects; Combo e'stim/US and STW/M to patient's affected cervical region, intermittment cervical traction beginning at 15#.  Postural exercises.    Consulted and Agree with Plan of Care Patient           Patient will benefit from skilled therapeutic intervention in order to improve the following deficits and impairments:  Pain,Postural dysfunction,Decreased activity tolerance,Decreased strength  Visit Diagnosis: Cervicalgia  Muscle weakness (generalized)     Problem List Patient Active Problem List   Diagnosis Date Noted  . Morbid obesity ([place])   . COVID-19 virus infection 10/2020  . BMI 29.0-29.9,adult 04/11/2020  . 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 GERD (gastroesophageal reflux disease) 03/18/2014  . Depression 03/18/2014  . Hypertension 03/28/2013  . Hyperlipidemia 03/28/2013  . Hypothyroidism 03/28/2013  . Graves' disease     Standley Brooking, PTA 12/04/2020, 5:03 PM  [facility] [address] [place], [place], [address] Phone: [phone]   Fax:  [phone]  Name: QUNISHA BRYK MRN: [id number] Date of Birth: [birth year]

## 2020-12-09 ENCOUNTER — Ambulatory Visit: Payer: 59 | Admitting: *Deleted

## 2020-12-11 ENCOUNTER — Ambulatory Visit: Payer: 59 | Admitting: *Deleted

## 2020-12-11 ENCOUNTER — Other Ambulatory Visit: Payer: Self-pay

## 2020-12-11 DIAGNOSIS — M6281 Muscle weakness (generalized): Secondary | ICD-10-CM

## 2020-12-11 DIAGNOSIS — M542 Cervicalgia: Secondary | ICD-10-CM

## 2020-12-11 NOTE — Therapy (Signed)
[facility] [place], [place], [address] Phone: [phone]   Fax:  [phone]  Physical Therapy Treatment  Patient Details  Name: Casey [name] MRN: [id number] Date of Birth: [birth year] Referring Provider (PT): Kristeen Miss MD   Encounter Date: 12/11/2020   PT End of Session - 12/11/20 1403    Visit Number 9    Number of Visits 12    Date for PT Re-Evaluation 12/22/20    PT Start Time 1300    PT Stop Time 6073    PT Time Calculation (min) 48 min           Past Medical History:  Diagnosis Date  . Abnormal Pap smear of cervix    1985  . Anemia   . Anxiety   . Atypical nevus 12/15/2005   Right Scapula-Slight and Right Hip-Moderate to Marked (Exc.)  . Atypical nevus 06/06/2013   Left Upper Back-Moderate, and Left Shin-Mild  . Atypical nevus 06/20/2018   Right Deltoid-Moderate  . BCC (basal cell carcinoma of skin) 06/23/2006   Right Neck  . BCC (basal cell carcinoma of skin) 06/20/2018   Right Outerback (tx p bx)  . Cancer ([place])    premelanoma per hip   . Cancer of appendix ([place])   . COVID-19 virus infection 10/2020  . Depression   . DUB (dysfunctional uterine bleeding)   . Dysrhythmia    pt states has PVCs   . Gallstones   . GERD (gastroesophageal reflux disease)   . Graves disease   . History of blood transfusion   . History of urinary tract infection    last one approx 4 years ago   . Hyperlipidemia   . Hypertension   . 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 Migraines   . Morbid obesity ([place])   . Ovarian cyst    both ovaries  . Palpitations   . Pneumonia    hx of had approx 22 years ago   . PONV (postoperative nausea and vomiting)   . Pulmonary nodule   . 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 APPENDECTOMY    . CRYOTHERAPY     for abnormal pap  . DIAGNOSTIC  LAPAROSCOPY    . DILATION AND CURETTAGE OF UTERUS    . infertility testing     . LAPAROSCOPIC PARTIAL COLECTOMY N/A 02/02/2016   Procedure: LAPAROSCOPIC PARTIAL COlectomy right colon;  Surgeon: Greer Pickerel, MD;  Location: [facility];  Service: General;  Laterality: N/A;  . OOPHORECTOMY     & fallopian tubes  . TONSILLECTOMY    . VAGINAL HYSTERECTOMY     total  . VESICOVAGINAL FISTULA CLOSURE W/ TAH     Dr. Margaretha Glassing  2 degree DUB     There were no vitals filed for this visit.                      [facility] Adult PT Treatment/Exercise - 12/11/20 0001      Exercises   Exercises Shoulder      Shoulder Exercises: Standing   Extension Strengthening;Both;20 reps   2x10   Extension Limitations XTS blue 2x10    Row Strengthening;Both;20 reps;Limitations   2x10   Row Limitations Blue XTS; reported shoulder weakness      Modalities   Modalities Traction;Ultrasound  Ultrasound   Ultrasound Location LT UT and cerv paras    Ultrasound Parameters Combo 1.5 w/cm2 x 10 mins    Ultrasound Goals Pain      Traction   Type of Traction Cervical    Min (lbs) 5    Max (lbs) 20    Hold Time 99    Rest Time 5    Time 15      Manual Therapy   Manual Therapy Soft tissue mobilization    Soft tissue mobilization STW / IASTW to L UT, levator scapula, cervical paraspinals to reduce tone and stiffness                       PT Long Term Goals - 12/02/20 1638      PT LONG TERM GOAL #1   Title Independent with a HEP.    Time 6    Period Weeks    Status Achieved      PT LONG TERM GOAL #2   Title Perform ADL's with pain not > 2-3/10.    Time 6    Period Weeks    Status On-going   Reports pain is greater with OH activities     PT LONG TERM GOAL #3   Title Eliminate UE symproms.    Time 6    Period Weeks    Status On-going                 Plan - 12/11/20 1411    Clinical Impression Statement Pt arrived today with inreased soreness LT side due to  working/ painting at home. Decreased exs today due to increased pain/soreness. Pt did well with Rx with good TPR LT UT and tolerated 20#s of cerv traction today.    Examination-Participation Restrictions Other    Rehab Potential Good    PT Frequency 2x / week    PT Duration 6 weeks    PT Treatment/Interventions ADLs/Self Care Home Management;Cryotherapy;Electrical Stimulation;Ultrasound;Traction;Moist Heat;Therapeutic activities;Therapeutic exercise;Manual techniques;Patient/family education;Passive range of motion;Dry needling    PT Next Visit Plan Combo e'stim/US and STW/M to patient's affected cervical region, intermittment cervical traction beginning at 20#.  Postural exercises.    Consulted and Agree with Plan of Care Patient           Patient will benefit from skilled therapeutic intervention in order to improve the following deficits and impairments:  Pain,Postural dysfunction,Decreased activity tolerance,Decreased strength  Visit Diagnosis: Cervicalgia  Muscle weakness (generalized)     Problem List Patient Active Problem List   Diagnosis Date Noted  . Morbid obesity ([place])   . COVID-19 virus infection 10/2020  . BMI 29.0-29.9,adult 04/11/2020  . 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 GERD (gastroesophageal reflux disease) 03/18/2014  . Depression 03/18/2014  . Hypertension 03/28/2013  . Hyperlipidemia 03/28/2013  . Hypothyroidism 03/28/2013  . Graves' disease     Amita Atayde,CHRIS , PTA 12/11/2020, 2:21 PM  [facility] [address] [place], [place], [address] Phone: [phone]   Fax:  [phone]  Name: Casey [name] MRN: [id number] Date of Birth: 1961/10/12

## 2020-12-16 ENCOUNTER — Encounter: Payer: Self-pay | Admitting: Physical Therapy

## 2020-12-16 ENCOUNTER — Ambulatory Visit: Payer: 59 | Admitting: Physical Therapy

## 2020-12-16 ENCOUNTER — Other Ambulatory Visit: Payer: Self-pay

## 2020-12-16 DIAGNOSIS — M542 Cervicalgia: Secondary | ICD-10-CM

## 2020-12-16 DIAGNOSIS — M6281 Muscle weakness (generalized): Secondary | ICD-10-CM

## 2020-12-16 NOTE — Therapy (Signed)
[facility] [place], [place], [address] Phone: [phone]   Fax:  [phone]  Physical Therapy Treatment  Patient Details  Name: Casey [name] MRN: [id number] Date of Birth: 23-Jul-1961 Referring Provider (PT): Kristeen Miss MD   Encounter Date: 12/16/2020   PT End of Session - 12/16/20 1601    Visit Number 10    Number of Visits 12    Date for PT Re-Evaluation 12/22/20    PT Start Time 1603    PT Stop Time 1646    PT Time Calculation (min) 43 min    Activity Tolerance Patient tolerated treatment well    Behavior During Therapy [facility] for tasks assessed/performed           Past Medical History:  Diagnosis Date  . Abnormal Pap smear of cervix    1985  . Anemia   . Anxiety   . Atypical nevus 12/15/2005   Right Scapula-Slight and Right Hip-Moderate to Marked (Exc.)  . Atypical nevus 06/06/2013   Left Upper Back-Moderate, and Left Shin-Mild  . Atypical nevus 06/20/2018   Right Deltoid-Moderate  . BCC (basal cell carcinoma of skin) 06/23/2006   Right Neck  . BCC (basal cell carcinoma of skin) 06/20/2018   Right Outerback (tx p bx)  . Cancer ([place])    premelanoma per hip   . Cancer of appendix ([place])   . COVID-19 virus infection 10/2020  . Depression   . DUB (dysfunctional uterine bleeding)   . Dysrhythmia    pt states has PVCs   . Gallstones   . GERD (gastroesophageal reflux disease)   . Graves disease   . History of blood transfusion   . History of urinary tract infection    last one approx 4 years ago   . Hyperlipidemia   . Hypertension   . 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 Migraines   . Morbid obesity ([place])   . Ovarian cyst    both ovaries  . Palpitations   . Pneumonia    hx of had approx 22 years ago   . PONV (postoperative nausea and vomiting)   . Pulmonary nodule   . 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 APPENDECTOMY    . CRYOTHERAPY     for abnormal pap  . DIAGNOSTIC LAPAROSCOPY    . DILATION AND CURETTAGE OF UTERUS    . infertility testing     . LAPAROSCOPIC PARTIAL COLECTOMY N/A 02/02/2016   Procedure: LAPAROSCOPIC PARTIAL COlectomy right colon;  Surgeon: Greer Pickerel, MD;  Location: [facility];  Service: General;  Laterality: N/A;  . OOPHORECTOMY     & fallopian tubes  . TONSILLECTOMY    . VAGINAL HYSTERECTOMY     total  . VESICOVAGINAL FISTULA CLOSURE W/ TAH     Dr. Margaretha Glassing  2 degree DUB     There were no vitals filed for this visit.   Subjective Assessment - 12/16/20 1600    Subjective COVID 19 screening performed on patient upon arrival. No pain or numbness reported today but states that she had more L hand numbness yesterday. States that it may be attributed to more computer work this week due to testing.    Pertinent History Cancer (rsolved), Graves disease, HTN.    How long can you sit comfortably? Varies.    Diagnostic tests  MRI.    Patient Stated Goals Have as little pain as possible.  Avoid surgery.    Currently in Pain? Yes    Pain Score 2     Pain Location Neck    Pain Orientation Right;Left    Pain Descriptors / Indicators Discomfort    Pain Type Acute pain    Pain Onset More than a month ago    Pain Frequency Intermittent                             [facility] Adult PT Treatment/Exercise - 12/16/20 0001      Shoulder Exercises: Standing   Horizontal ABduction Strengthening;Both;20 reps;Theraband    Theraband Level (Shoulder Horizontal ABduction) Level 2 (Red)    External Rotation Strengthening;Both;20 reps;Theraband    Theraband Level (Shoulder External Rotation) Level 2 (Red)    Flexion AROM;Both;20 reps    ABduction AROM;Both;20 reps    Extension Strengthening;Both;Limitations;20 reps    Extension Limitations Blue XTS    Row Strengthening;Both;20 reps;Limitations    Row Limitations Blue XTS;     Other Standing Exercises B shoulder scaption x20 reps      Shoulder Exercises: ROM/Strengthening   UBE (Upper Arm Bike) 90 RPM x8 min (forward/backward)    Wall Pushups 20 reps      Traction   Type of Traction Cervical    Min (lbs) 5    Max (lbs) 20    Hold Time 99    Rest Time 5    Time 15                       PT Long Term Goals - 12/02/20 1638      PT LONG TERM GOAL #1   Title Independent with a HEP.    Time 6    Period Weeks    Status Achieved      PT LONG TERM GOAL #2   Title Perform ADL's with pain not > 2-3/10.    Time 6    Period Weeks    Status On-going   Reports pain is greater with OH activities     PT LONG TERM GOAL #3   Title Eliminate UE symproms.    Time 6    Period Weeks    Status On-going                 Plan - 12/16/20 1637    Clinical Impression Statement Patient presented in clinic with continues to experience intermittant LUE radicular symptoms which were most recently in L hand and fingers. Patient has been doing more computer work and paperwork so is uncertain if that has caused the symptoms in the last several days. Patient guided through more postural strengthening with cervical stabilization focus as most standing exercises completed with towel roll behind patient's head. Mostly muscle fatigue reported during therex session. Mechanical cervical traction completed at 20# max.    Personal Factors and Comorbidities Comorbidity 1;Comorbidity 2    Comorbidities Cancer (rsolved), Graves disease, HTN.    Examination-Activity Limitations Other    Examination-Participation Restrictions Other    Stability/Clinical Decision Making Evolving/Moderate complexity    Rehab Potential Good    PT Frequency 2x / week    PT Duration 6 weeks    PT Treatment/Interventions ADLs/Self Care Home Management;Cryotherapy;Electrical Stimulation;Ultrasound;Traction;Moist Heat;Therapeutic activities;Therapeutic exercise;Manual techniques;Patient/family  education;Passive range of motion;Dry needling    PT Next Visit Plan Continue postural training and cervical  traction per POC. Assess symptoms.    Consulted and Agree with Plan of Care Patient           Patient will benefit from skilled therapeutic intervention in order to improve the following deficits and impairments:  Pain,Postural dysfunction,Decreased activity tolerance,Decreased strength  Visit Diagnosis: Cervicalgia  Muscle weakness (generalized)     Problem List Patient Active Problem List   Diagnosis Date Noted  . Morbid obesity ([place])   . COVID-19 virus infection 10/2020  . BMI 29.0-29.9,adult 04/11/2020  . 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 GERD (gastroesophageal reflux disease) 03/18/2014  . Depression 03/18/2014  . Hypertension 03/28/2013  . Hyperlipidemia 03/28/2013  . Hypothyroidism 03/28/2013  . Graves' disease     Standley Brooking, PTA 12/16/2020, 5:33 PM  [facility] [address] [place], [place], [address] Phone: [phone]   Fax:  [phone]  Name: MCKYNZIE COWEN MRN: [id number] Date of Birth: [birth year]  Progress Note Reporting Period 11/10/20 to 12/17/19  See note below for Objective Data and Assessment of Progress/Goals.  Patient progressing toward goals.  LTG #1 and progressing with LTG #2.    Mali Applegate MPT

## 2020-12-18 ENCOUNTER — Other Ambulatory Visit: Payer: Self-pay

## 2020-12-18 ENCOUNTER — Ambulatory Visit: Payer: 59 | Admitting: Physical Therapy

## 2020-12-18 DIAGNOSIS — M542 Cervicalgia: Secondary | ICD-10-CM

## 2020-12-18 DIAGNOSIS — M6281 Muscle weakness (generalized): Secondary | ICD-10-CM

## 2020-12-18 NOTE — Therapy (Signed)
[facility] [address] [place], [place], [address] Phone: [phone]   Fax:  [phone]  Physical Therapy Treatment  Patient Details  Name: Casey [name] MRN: [id number] Date of Birth: Jun 23, 1961 Referring Provider (PT): Barnett Abu MD   Encounter Date: 12/18/2020   PT End of Session - 12/18/20 1641    Visit Number 11    Number of Visits 12    Date for PT Re-Evaluation 12/22/20    PT Start Time 0400    PT Stop Time 0450    PT Time Calculation (min) 50 min    Activity Tolerance Patient tolerated treatment well    Behavior During Therapy [facility] for tasks assessed/performed           Past Medical History:  Diagnosis Date  . Abnormal Pap smear of cervix    1985  . Anemia   . Anxiety   . Atypical nevus 12/15/2005   Right Scapula-Slight and Right Hip-Moderate to Marked (Exc.)  . Atypical nevus 06/06/2013   Left Upper Back-Moderate, and Left Shin-Mild  . Atypical nevus 06/20/2018   Right Deltoid-Moderate  . BCC (basal cell carcinoma of skin) 06/23/2006   Right Neck  . BCC (basal cell carcinoma of skin) 06/20/2018   Right Outerback (tx p bx)  . Cancer ([facility])    premelanoma per hip   . Cancer of appendix ([facility])   . COVID-19 virus infection 10/2020  . Depression   . DUB (dysfunctional uterine bleeding)   . Dysrhythmia    pt states has PVCs   . Gallstones   . GERD (gastroesophageal reflux disease)   . Graves disease   . History of blood transfusion   . History of urinary tract infection    last one approx 4 years ago   . Hyperlipidemia   . Hypertension   . 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 Migraines   . Morbid obesity ([facility])   . Ovarian cyst    both ovaries  . Palpitations   . Pneumonia    hx of had approx 22 years ago   . PONV (postoperative nausea and vomiting)   . Pulmonary nodule   . 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 APPENDECTOMY    . CRYOTHERAPY     for abnormal pap  . DIAGNOSTIC LAPAROSCOPY    . DILATION AND CURETTAGE OF UTERUS    . infertility testing     . LAPAROSCOPIC PARTIAL COLECTOMY N/A 02/02/2016   Procedure: LAPAROSCOPIC PARTIAL COlectomy right colon;  Surgeon: Gaynelle Adu, MD;  Location: [facility];  Service: General;  Laterality: N/A;  . OOPHORECTOMY     & fallopian tubes  . TONSILLECTOMY    . VAGINAL HYSTERECTOMY     total  . VESICOVAGINAL FISTULA CLOSURE W/ TAH     Dr. Myrlene Broker  2 degree DUB     There were no vitals filed for this visit.   Subjective Assessment - 12/18/20 1644    Subjective COVID-19 screen performed prior to patient entering clinic.  Feeling good today.    Pertinent History Cancer (rsolved), Graves disease, HTN.    How long can you sit comfortably? Varies.    Diagnostic tests MRI.    Patient Stated Goals Have as little pain as possible.  Avoid surgery.    Currently in Pain? Yes    Pain  Score 1     Pain Location Neck    Pain Orientation Left    Pain Descriptors / Indicators Discomfort    Pain Type Acute pain    Pain Onset More than a month ago                             [facility] Adult PT Treatment/Exercise - 12/18/20 0001      Modalities   Modalities Ultrasound;Traction      Ultrasound   Ultrasound Location LT cervical/left UT.    Ultrasound Parameters Combo e'stim/US at 1.50 W/CM2 x 12 minutes.    Ultrasound Goals Pain      Traction   Type of Traction Cervical    Min (lbs) 5    Max (lbs) 21    Hold Time 99    Rest Time 5    Time 15      Manual Therapy   Manual Therapy Soft tissue mobilization    Soft tissue mobilization STW/M x 11 minutes to patient's left cervical/left UT region.                       PT Long Term Goals - 12/02/20 1638      PT LONG TERM GOAL #1   Title Independent with a HEP.    Time 6    Period Weeks    Status Achieved      PT LONG TERM GOAL #2    Title Perform ADL's with pain not > 2-3/10.    Time 6    Period Weeks    Status On-going   Reports pain is greater with OH activities     PT LONG TERM GOAL #3   Title Eliminate UE symproms.    Time 6    Period Weeks    Status On-going                 Plan - 12/18/20 1647    Clinical Impression Statement Patient doing very well with minimal pain and symptoms today.  She did well with a 1# increase in intermittment cervical traction today.    Personal Factors and Comorbidities Comorbidity 1;Comorbidity 2    Comorbidities Cancer (rsolved), Graves disease, HTN.    Examination-Activity Limitations Other    Examination-Participation Restrictions Other    Stability/Clinical Decision Making Evolving/Moderate complexity    Rehab Potential Good    PT Frequency 2x / week    PT Duration 6 weeks    PT Treatment/Interventions ADLs/Self Care Home Management;Cryotherapy;Electrical Stimulation;Ultrasound;Traction;Moist Heat;Therapeutic activities;Therapeutic exercise;Manual techniques;Patient/family education;Passive range of motion;Dry needling    PT Next Visit Plan Continue postural training and cervical traction per POC. Assess symptoms.    Consulted and Agree with Plan of Care Patient           Patient will benefit from skilled therapeutic intervention in order to improve the following deficits and impairments:  Pain,Postural dysfunction,Decreased activity tolerance,Decreased strength  Visit Diagnosis: Cervicalgia  Muscle weakness (generalized)     Problem List Patient Active Problem List   Diagnosis Date Noted  . Morbid obesity ([place])   . COVID-19 virus infection 10/2020  . BMI 29.0-29.9,adult 04/11/2020  . 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 GERD (gastroesophageal reflux disease) 03/18/2014  . Depression 03/18/2014  . Hypertension 03/28/2013  . Hyperlipidemia 03/28/2013  . Hypothyroidism 03/28/2013  . Graves' disease     Casey [name], Casey [name]  MPT 12/18/2020, 4:50 PM  [facility] [place], [place], [address] Phone: [phone]   Fax:  [phone]  Name: Casey [name] MRN: [id number] Date of Birth: 1961/08/20

## 2020-12-23 ENCOUNTER — Ambulatory Visit: Payer: 59 | Admitting: Physical Therapy

## 2020-12-25 ENCOUNTER — Encounter: Payer: 59 | Admitting: Physical Therapy

## 2021-02-17 ENCOUNTER — Other Ambulatory Visit: Payer: Self-pay | Admitting: Nurse Practitioner

## 2021-02-17 DIAGNOSIS — K529 Noninfective gastroenteritis and colitis, unspecified: Secondary | ICD-10-CM

## 2021-03-06 ENCOUNTER — Other Ambulatory Visit: Payer: Self-pay | Admitting: Nurse Practitioner

## 2021-03-06 DIAGNOSIS — F5101 Primary insomnia: Secondary | ICD-10-CM

## 2021-04-06 ENCOUNTER — Other Ambulatory Visit: Payer: Self-pay | Admitting: Nurse Practitioner

## 2021-04-06 DIAGNOSIS — F5101 Primary insomnia: Secondary | ICD-10-CM

## 2021-04-08 ENCOUNTER — Ambulatory Visit: Payer: 59 | Admitting: Nurse Practitioner

## 2021-04-08 ENCOUNTER — Encounter: Payer: Self-pay | Admitting: Nurse Practitioner

## 2021-04-08 ENCOUNTER — Other Ambulatory Visit: Payer: Self-pay

## 2021-04-08 VITALS — BP 119/69 | HR 62 | Temp 98.4°F | Resp 20 | Ht 69.0 in | Wt 206.0 lb

## 2021-04-08 DIAGNOSIS — F3342 Major depressive disorder, recurrent, in full remission: Secondary | ICD-10-CM

## 2021-04-08 DIAGNOSIS — Z6830 Body mass index (BMI) 30.0-30.9, adult: Secondary | ICD-10-CM

## 2021-04-08 DIAGNOSIS — E039 Hypothyroidism, unspecified: Secondary | ICD-10-CM

## 2021-04-08 DIAGNOSIS — F5101 Primary insomnia: Secondary | ICD-10-CM

## 2021-04-08 DIAGNOSIS — K219 Gastro-esophageal reflux disease without esophagitis: Secondary | ICD-10-CM

## 2021-04-08 DIAGNOSIS — D373 Neoplasm of uncertain behavior of appendix: Secondary | ICD-10-CM

## 2021-04-08 DIAGNOSIS — K529 Noninfective gastroenteritis and colitis, unspecified: Secondary | ICD-10-CM

## 2021-04-08 DIAGNOSIS — I10 Essential (primary) hypertension: Secondary | ICD-10-CM

## 2021-04-08 DIAGNOSIS — E7800 Pure hypercholesterolemia, unspecified: Secondary | ICD-10-CM

## 2021-04-08 MED ORDER — METOPROLOL SUCCINATE ER 50 MG PO TB24: ORAL_TABLET | ORAL | 1 refills | Status: DC

## 2021-04-08 MED ORDER — PANTOPRAZOLE SODIUM 40 MG PO TBEC: 40.0000 mg | DELAYED_RELEASE_TABLET | Freq: Every day | ORAL | 1 refills | Status: DC | PRN

## 2021-04-08 MED ORDER — ZOLPIDEM TARTRATE 10 MG PO TABS: 10.0000 mg | ORAL_TABLET | Freq: Every evening | ORAL | 5 refills | Status: DC | PRN

## 2021-04-08 MED ORDER — VALSARTAN-HYDROCHLOROTHIAZIDE 160-12.5 MG PO TABS
ORAL_TABLET | ORAL | 1 refills | Status: DC
Start: 2021-04-08 — End: 2021-10-09

## 2021-04-08 MED ORDER — LEVOTHYROXINE SODIUM 150 MCG PO TABS: 150.0000 ug | ORAL_TABLET | Freq: Every day | ORAL | 1 refills | Status: DC

## 2021-04-08 MED ORDER — FENOFIBRATE 160 MG PO TABS: ORAL_TABLET | ORAL | 1 refills | Status: DC

## 2021-04-08 MED ORDER — DIPHENOXYLATE-ATROPINE 2.5-0.025 MG PO TABS: ORAL_TABLET | ORAL | 1 refills | Status: DC

## 2021-04-08 MED ORDER — ESCITALOPRAM OXALATE 10 MG PO TABS
ORAL_TABLET | ORAL | 1 refills | Status: DC
Start: 2021-04-08 — End: 2021-06-24

## 2021-04-08 NOTE — Progress Notes (Signed)
Subjective:    Patient ID: Casey [name], female    DOB: [birth year], 60 y.o.   MRN: [id number]   Chief Complaint: Medical Management of Chronic Issues    HPI:  1. Primary hypertension No c/o chest pain, sob or headache. Does not check blood pressure at home BP Readings from Last 3 Encounters:  09/18/20 113/78  08/07/20 133/78  11/20/18 117/66     2. Gastroesophageal reflux disease without esophagitis Is on protonix. Has occasional break through symptoms.  3. Low grade mucinous neoplasm of appendix Sees surgeon every other year. Is no longer seeing oncologist. Casey [name] last CT was negative. Still has diarrhea. The lomotil works well for Casey [name].  4. Acquired hypothyroidism No problems that Casey [name] is aware of. Lab Results  Component Value Date   TSH 0.506 08/07/2020     5. Pure hypercholesterolemia Does try to watch diet and stays as active as Casey [name] can. Lab Results  Component Value Date   CHOL 141 08/07/2020   HDL 51 08/07/2020   LDLCALC 71 08/07/2020   TRIG 104 08/07/2020   CHOLHDL 2.8 08/07/2020     6. Recurrent major depressive disorder, in full remission ([place]) Is currently on lexapro which is working well for Casey [name] Depression screen [facility] 2/9 04/08/2021 09/18/2020 08/07/2020  Decreased Interest 1 0 1  Down, Depressed, Hopeless 3 0 0  PHQ - 2 Score 4 0 1  Altered sleeping 0 0 1  Tired, decreased energy 3 0 2  Change in appetite 0 0 0  Feeling bad or failure about yourself  0 0 0  Trouble concentrating 0 0 0  Moving slowly or fidgety/restless 2 0 0  Suicidal thoughts 0 0 0  PHQ-9 Score 9 0 4  Difficult doing work/chores Somewhat difficult Not difficult at all -     7. Insomnia Takes ambien nightly to sleep. Is unable to sleep without it.   8 bmi 30-30.9 Wt Readings from Last 3 Encounters:  04/08/21 206 lb (93.4 kg)  09/18/20 206 lb (93.4 kg)  08/07/20 210 lb (95.3 kg)   BMI Readings from Last 3 Encounters:  04/08/21 30.42 kg/m  09/18/20 30.42 kg/m   08/07/20 30.35 kg/m       Outpatient Encounter Medications as of 04/08/2021  Medication Sig  . cetirizine (ZYRTEC) 10 MG tablet TAKE ONE (1) TABLET EACH DAY  . cyanocobalamin (,VITAMIN B-12,) 1000 MCG/ML injection 4ml every 14 days  . diphenoxylate-atropine (LOMOTIL) 2.5-0.025 MG tablet TAKE TWO TABLETS BY MOUTH FOUR TIMES A DAY  . escitalopram (LEXAPRO) 10 MG tablet TAKE ONE (1) TABLET EACH DAY (Needs to be seen before next refill)  . fenofibrate 160 MG tablet TAKE ONE (1) TABLET EACH DAY (Needs to be seen before next refill)  . folic acid (FOLVITE) 1 MG tablet TAKE ONE (1) TABLET EACH DAY  . levothyroxine (SYNTHROID) 150 MCG tablet Take 1 tablet (150 mcg total) by mouth daily.  . methocarbamol (ROBAXIN) 500 MG tablet TAKE 1 TABLET EVERY 8 HOURS AS NEEDED FOR MUSCLE SPASMS  . metoprolol succinate (TOPROL-XL) 50 MG 24 hr tablet TAKE ONE (1) TABLET EACH DAY (Needs to be seen before next refill)  . pantoprazole (PROTONIX) 40 MG tablet Take 1 tablet (40 mg total) by mouth daily as needed. (Needs to be seen before next refill)  . promethazine (PHENERGAN) 25 MG tablet TAKE 1 TABLET EVERY 6 HOURS AS NEEDED FOR NAUSEA AND VOMITING  . RESTASIS 0.05 % ophthalmic emulsion 2 drops in the morning and at  bedtime.  . valsartan-hydrochlorothiazide (DIOVAN-HCT) 160-12.5 MG tablet TAKE ONE (1) TABLET EACH DAY (Needs to be seen before next refill)  . zolpidem (AMBIEN) 10 MG tablet Take 1 tablet (10 mg total) by mouth at bedtime as needed. for sleep   No facility-administered encounter medications on file as of 04/08/2021.    Past Surgical History:  Procedure Laterality Date  . APPENDECTOMY    . CRYOTHERAPY     for abnormal pap  . DIAGNOSTIC LAPAROSCOPY    . DILATION AND CURETTAGE OF UTERUS    . infertility testing     . LAPAROSCOPIC PARTIAL COLECTOMY N/A 02/02/2016   Procedure: LAPAROSCOPIC PARTIAL COlectomy right colon;  Surgeon: Greer Pickerel, MD;  Location: [facility];  Service: General;  Laterality:  N/A;  . OOPHORECTOMY     & fallopian tubes  . TONSILLECTOMY    . VAGINAL HYSTERECTOMY     total  . VESICOVAGINAL FISTULA CLOSURE W/ TAH     Dr. Margaretha Glassing  2 degree DUB     Family History  Problem Relation Age of Onset  . Hypertension Mother   . Irregular heart beat Mother   . Thyroid disease Maternal Grandmother   . Cancer Maternal Grandmother        lung  . Cancer Paternal Grandfather        lung    New complaints: None today  Social history: Lives with Casey [name] husband. Casey [name] daughter and grandson live with Casey [name]. Casey [name] has 4 grandchildren and is getting ready to hav eanother grandchild in about 6 months  Controlled substance contract: 04/08/21     Review of Systems  Constitutional: Negative for diaphoresis.  Eyes: Negative for pain.  Respiratory: Negative for shortness of breath.   Cardiovascular: Negative for chest pain, palpitations and leg swelling.  Gastrointestinal: Negative for abdominal pain.  Endocrine: Negative for polydipsia.  Skin: Negative for rash.  Neurological: Negative for dizziness, weakness and headaches.  Hematological: Does not bruise/bleed easily.  All other systems reviewed and are negative.      Objective:   Physical Exam Vitals and nursing note reviewed.  Constitutional:      General: Casey [name] is not in acute distress.    Appearance: Normal appearance. Casey [name] is well-developed.  HENT:     Head: Normocephalic.     Nose: Nose normal.  Eyes:     Pupils: Pupils are equal, round, and reactive to light.  Neck:     Vascular: No carotid bruit or JVD.  Cardiovascular:     Rate and Rhythm: Normal rate and regular rhythm.     Heart sounds: Normal heart sounds.  Pulmonary:     Effort: Pulmonary effort is normal. No respiratory distress.     Breath sounds: Normal breath sounds. No wheezing or rales.  Chest:     Chest wall: No tenderness.  Abdominal:     General: Bowel sounds are normal. There is no distension or abdominal bruit.     Palpations: Abdomen  is soft. There is no hepatomegaly, splenomegaly, mass or pulsatile mass.     Tenderness: There is no abdominal tenderness.  Musculoskeletal:        General: Normal range of motion.     Cervical back: Normal range of motion and neck supple.  Lymphadenopathy:     Cervical: No cervical adenopathy.  Skin:    General: Skin is warm and dry.  Neurological:     Mental Status: Casey [name] is alert and oriented to person, place, and time.  Deep Tendon Reflexes: Reflexes are normal and symmetric.  Psychiatric:        Behavior: Behavior normal.        Thought Content: Thought content normal.        Judgment: Judgment normal.    BP 119/69   Pulse 62   Temp 98.4 F (36.9 C) (Temporal)   Resp 20   Ht 5\' 9"  (1.753 m)   Wt 206 lb (93.4 kg)   LMP 05/23/1995   SpO2 93%   BMI 30.42 kg/m         Assessment & Plan:  Casey [name] comes in today with chief complaint of Medical Management of Chronic Issues   Diagnosis and orders addressed:  1. Primary hypertension Low sodium diet - valsartan-hydrochlorothiazide (DIOVAN-HCT) 160-12.5 MG tablet; TAKE ONE (1) TABLET EACH DAY (Needs to be seen before next refill)  Dispense: 90 tablet; Refill: 1 - metoprolol succinate (TOPROL-XL) 50 MG 24 hr tablet; TAKE ONE (1) TABLET EACH DAY (Needs to be seen before next refill)  Dispense: 90 tablet; Refill: 1  2. Gastroesophageal reflux disease without esophagitis Avoid spicy foods Do not eat 2 hours prior to bedtime - pantoprazole (PROTONIX) 40 MG tablet; Take 1 tablet (40 mg total) by mouth daily as needed. (Needs to be seen before next refill)  Dispense: 90 tablet; Refill: 1  3. Low grade mucinous neoplasm of appendix Keep follow up with surgeon  4. Acquired hypothyroidism Labs pending - levothyroxine (SYNTHROID) 150 MCG tablet; Take 1 tablet (150 mcg total) by mouth daily.  Dispense: 90 tablet; Refill: 1  5. Pure hypercholesterolemia Low fat diet - fenofibrate 160 MG tablet; TAKE ONE (1) TABLET  EACH DAY (Needs to be seen before next refill)  Dispense: 90 tablet; Refill: 1  6. Recurrent major depressive disorder, in full remission ([place]) Avoid spicy foods Do not eat 2 hours prior to bedtime - escitalopram (LEXAPRO) 10 MG tablet; TAKE ONE (1) TABLET EACH DAY (Needs to be seen before next refill)  Dispense: 90 tablet; Refill: 1  7. Primary insomnia Bedtime routine - zolpidem (AMBIEN) 10 MG tablet; Take 1 tablet (10 mg total) by mouth at bedtime as needed. for sleep  Dispense: 30 tablet; Refill: 5  8. BMI 30.0-30.9,adult Discussed diet and exercise for person with BMI >25 Will recheck weight in 3-6 months  9. Chronic diarrhea  diphenoxylate-atropine (LOMOTIL) 2.5-0.025 MG tablet; TAKE TWO TABLETS BY MOUTH FOUR TIMES A DAY  Dispense: 120 tablet; Refill: 1  Labs pending Health Maintenance reviewed Diet and exercise encouraged  Follow up plan: 6 months    [name] Hassell Done, FNP

## 2021-04-08 NOTE — Patient Instructions (Signed)

## 2021-04-09 LAB — CMP14+EGFR
ALT: 17 IU/L (ref 0–32)
AST: 18 IU/L (ref 0–40)
Albumin/Globulin Ratio: 2.1 (ref 1.2–2.2)
Albumin: 4.4 g/dL (ref 3.8–4.9)
Alkaline Phosphatase: 49 IU/L (ref 44–121)
BUN/Creatinine Ratio: 16 (ref 9–23)
BUN: 26 mg/dL — ABNORMAL HIGH (ref 6–24)
Bilirubin Total: <0.2 mg/dL (ref 0.0–1.2)
CO2: 24 mmol/L (ref 20–29)
Calcium: 9.6 mg/dL (ref 8.7–10.2)
Chloride: 103 mmol/L (ref 96–106)
Creatinine, Ser: 1.58 mg/dL — ABNORMAL HIGH (ref 0.57–1.00)
Globulin, Total: 2.1 g/dL (ref 1.5–4.5)
Glucose: 94 mg/dL (ref 65–99)
Potassium: 3.6 mmol/L (ref 3.5–5.2)
Sodium: 142 mmol/L (ref 134–144)
Total Protein: 6.5 g/dL (ref 6.0–8.5)
eGFR: 37 mL/min/{1.73_m2} — ABNORMAL LOW (ref >59)

## 2021-04-09 LAB — LIPID PANEL
Chol/HDL Ratio: 2.6 ratio (ref 0.0–4.4)
Cholesterol, Total: 149 mg/dL (ref 100–199)
HDL: 58 mg/dL (ref >39)
LDL Chol Calc (NIH): 69 mg/dL (ref 0–99)
Triglycerides: 127 mg/dL (ref 0–149)
VLDL Cholesterol Cal: 22 mg/dL (ref 5–40)

## 2021-04-09 LAB — CBC WITH DIFFERENTIAL/PLATELET
Basophils Absolute: 0.1 10*3/uL (ref 0.0–0.2)
Basos: 1 %
EOS (ABSOLUTE): 0.3 10*3/uL (ref 0.0–0.4)
Eos: 3 %
Hematocrit: 36.9 % (ref 34.0–46.6)
Hemoglobin: 12.3 g/dL (ref 11.1–15.9)
Immature Grans (Abs): 0 10*3/uL (ref 0.0–0.1)
Immature Granulocytes: 0 %
Lymphocytes Absolute: 3.2 10*3/uL — ABNORMAL HIGH (ref 0.7–3.1)
Lymphs: 35 %
MCH: 29.1 pg (ref 26.6–33.0)
MCHC: 33.3 g/dL (ref 31.5–35.7)
MCV: 87 fL (ref 79–97)
Monocytes Absolute: 0.9 10*3/uL (ref 0.1–0.9)
Monocytes: 9 %
Neutrophils Absolute: 4.7 10*3/uL (ref 1.4–7.0)
Neutrophils: 52 %
Platelets: 227 10*3/uL (ref 150–450)
RBC: 4.22 x10E6/uL (ref 3.77–5.28)
RDW: 12.5 % (ref 11.7–15.4)
WBC: 9.1 10*3/uL (ref 3.4–10.8)

## 2021-04-09 LAB — VITAMIN B12: Vitamin B-12: 269 pg/mL (ref 232–1245)

## 2021-04-09 LAB — THYROID PANEL WITH TSH
Free Thyroxine Index: 2.8 (ref 1.2–4.9)
T3 Uptake Ratio: 28 % (ref 24–39)
T4, Total: 10 ug/dL (ref 4.5–12.0)
TSH: 2.5 u[IU]/mL (ref 0.450–4.500)

## 2021-04-09 LAB — VITAMIN D 25 HYDROXY (VIT D DEFICIENCY, FRACTURES): Vit D, 25-Hydroxy: 30.9 ng/mL (ref 30.0–100.0)

## 2021-04-14 ENCOUNTER — Other Ambulatory Visit (HOSPITAL_COMMUNITY): Payer: Self-pay | Admitting: Neurological Surgery

## 2021-04-14 DIAGNOSIS — M5020 Other cervical disc displacement, unspecified cervical region: Secondary | ICD-10-CM

## 2021-04-28 ENCOUNTER — Ambulatory Visit (HOSPITAL_COMMUNITY)
Admission: RE | Admit: 2021-04-28 | Discharge: 2021-04-28 | Disposition: A | Discharge status: Home / Self Care | Payer: 59 | Source: Ambulatory Visit | Attending: Neurological Surgery | Admitting: Neurological Surgery

## 2021-04-28 DIAGNOSIS — M5020 Other cervical disc displacement, unspecified cervical region: Secondary | ICD-10-CM | POA: Insufficient documentation

## 2021-04-28 IMAGING — MR MR CERVICAL SPINE W/O CM
5 series · 38 of 48 positions shown · non-contrast
Comparison: MRI [DATE].  Radiographs [DATE].

CLINICAL DATA: Neck pain radiating into both arms for 9 months. No
known injury.

EXAM:
MRI CERVICAL SPINE WITHOUT CONTRAST
TECHNIQUE: Multiplanar, multisequence MR imaging of the cervical spine was
performed. No intravenous contrast was administered.

[Series 5: T2 · sagittal · 3.0mm · 0.69mm/px · 6 of 15 slices shown (1 of 2)]
[im 1/15]
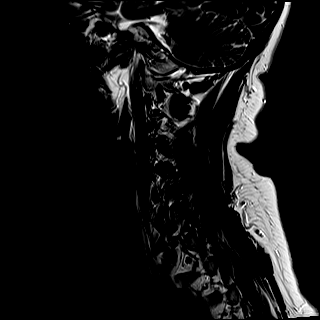
[im 3/15]
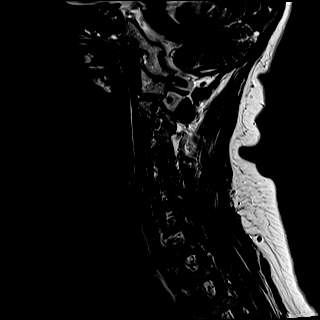
[im 6/15]
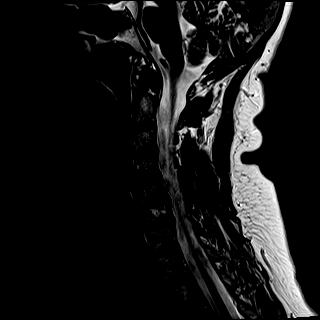
[im 9/15]
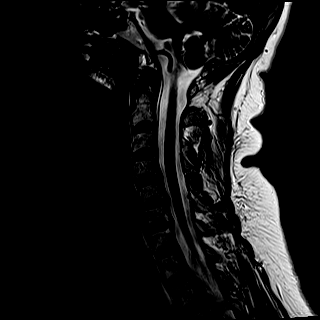
[im 12/15]
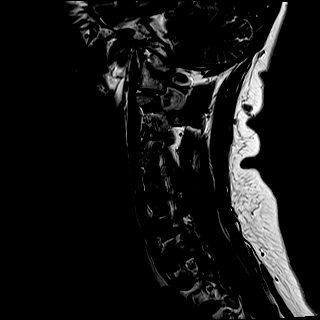
[im 15/15]
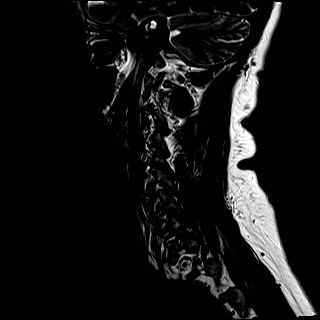

[Series 6: T1 · sagittal · 3.0mm · 0.86mm/px · 7 of 15 slices shown]
[im 1/15]
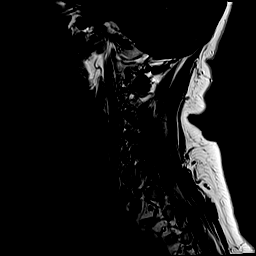
[im 3/15]
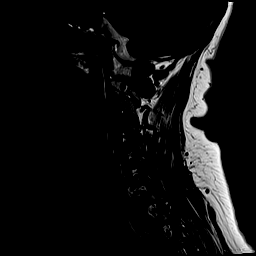
[im 5/15]
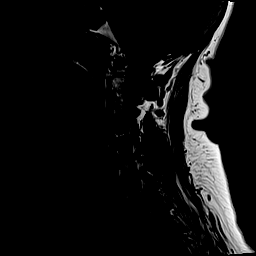
[im 8/15]
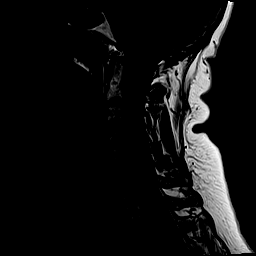
[im 10/15]
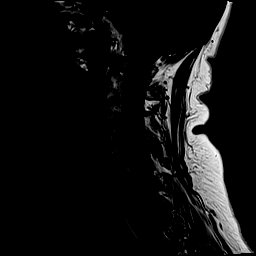
[im 12/15]
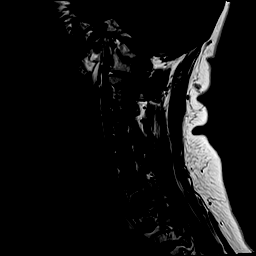
[im 15/15]
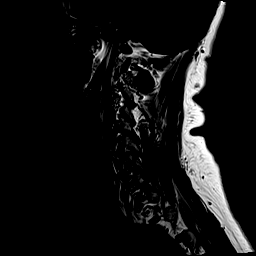

[Series 7: STIR · sagittal · 3.0mm · 0.69mm/px · 7 of 15 slices shown]
[im 1/15]
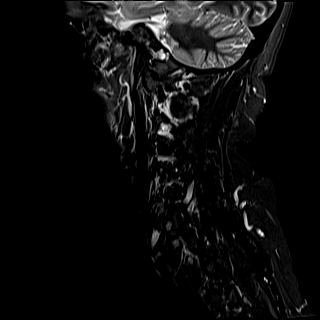
[im 3/15]
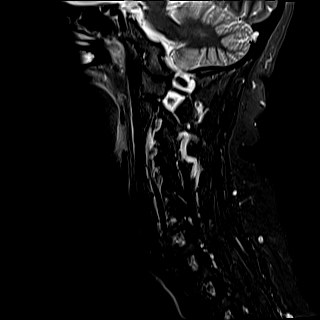
[im 5/15]
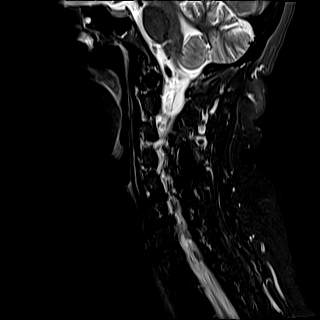
[im 8/15]
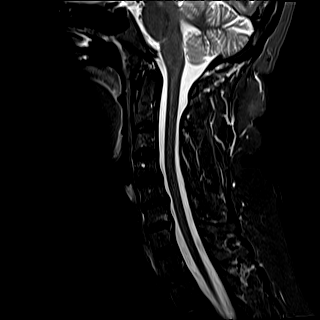
[im 10/15]
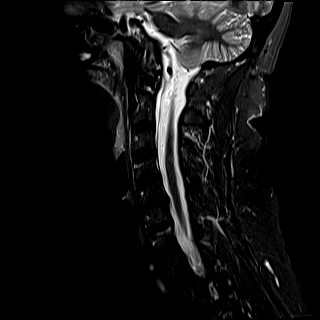
[im 12/15]
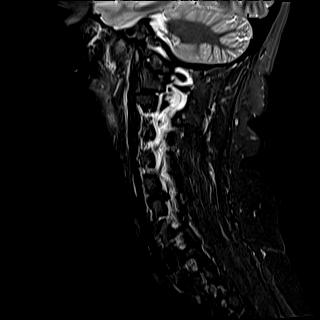
[im 15/15]
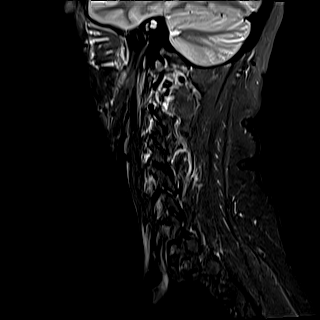

[Series 8: T2 · axial · 3.0mm · 0.70mm/px · z∈[-80,+18]mm · 10 of 30 slices shown (2 of 2)]
[im 1/30]
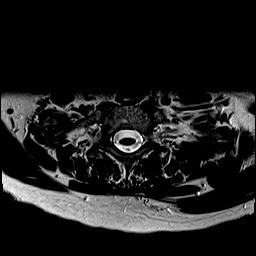
[im 3/30]
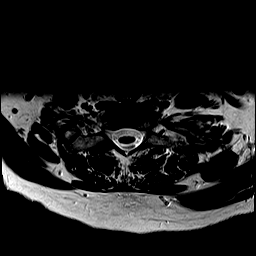
[im 5/30]
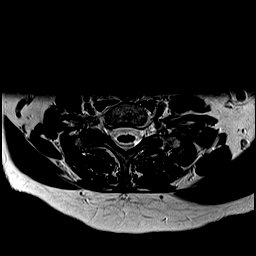
[im 7/30]
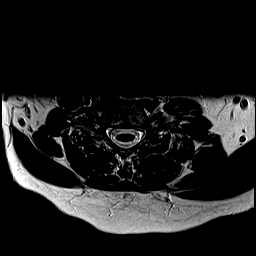
[im 9/30]
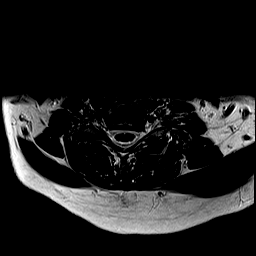
[im 14/30]
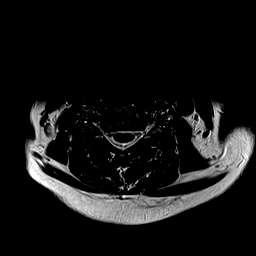
[im 16/30]
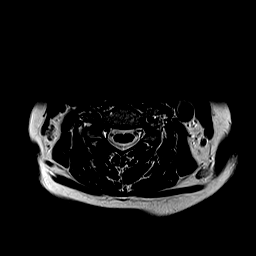
[im 21/30]
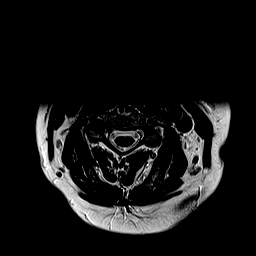
[im 25/30]
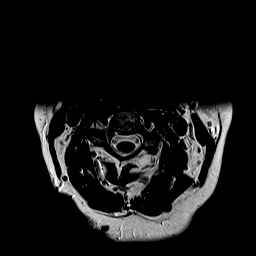
[im 30/30]
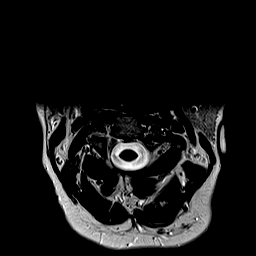

[Series 9: GRE · axial · 3.0mm · 0.35mm/px · z∈[-80,+18]mm · 8 of 30 slices shown]
[im 1/30]
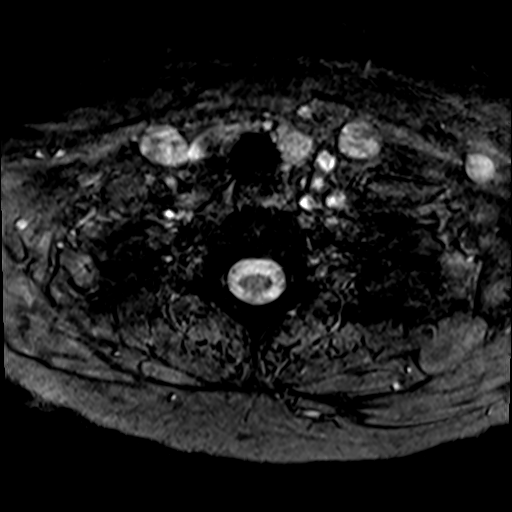
[im 5/30]
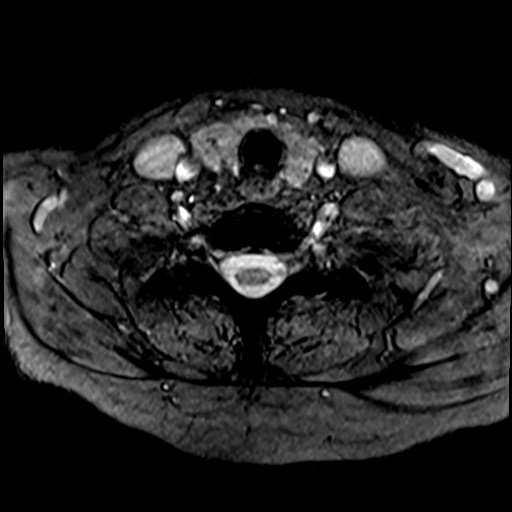
[im 9/30]
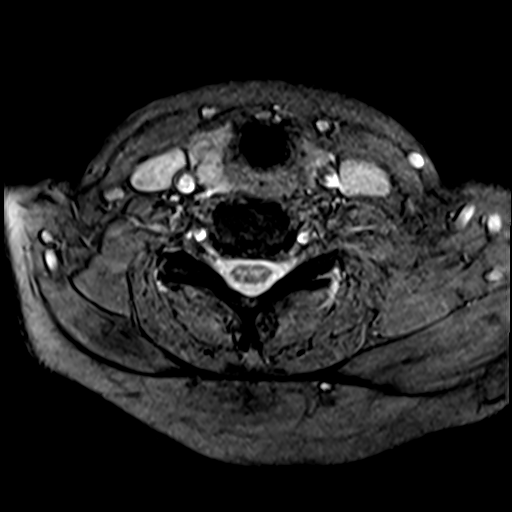
[im 14/30]
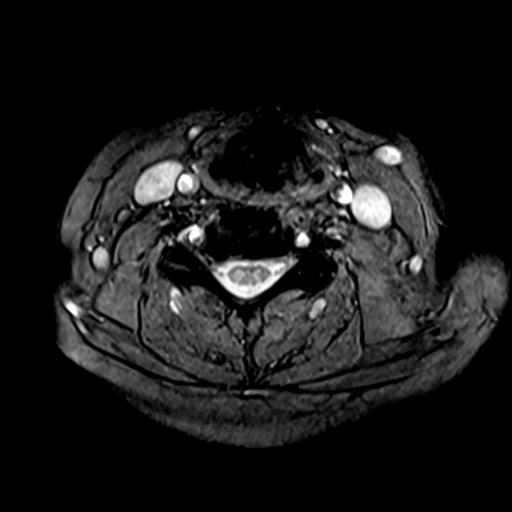
[im 16/30]
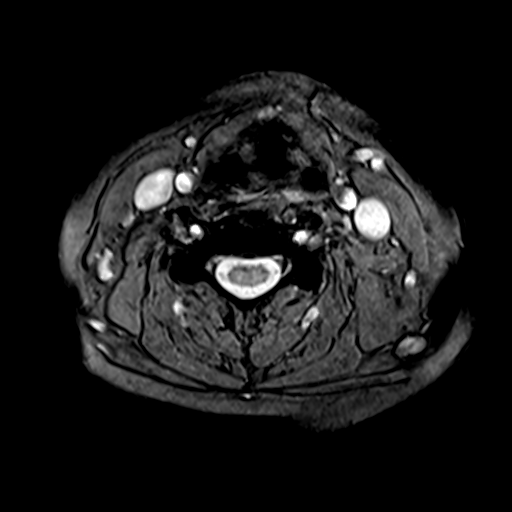
[im 21/30]
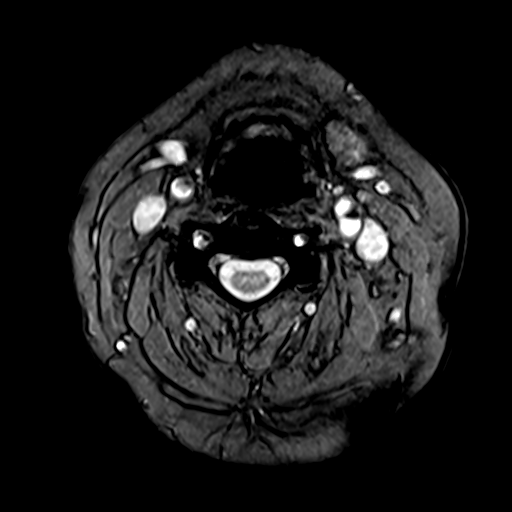
[im 25/30]
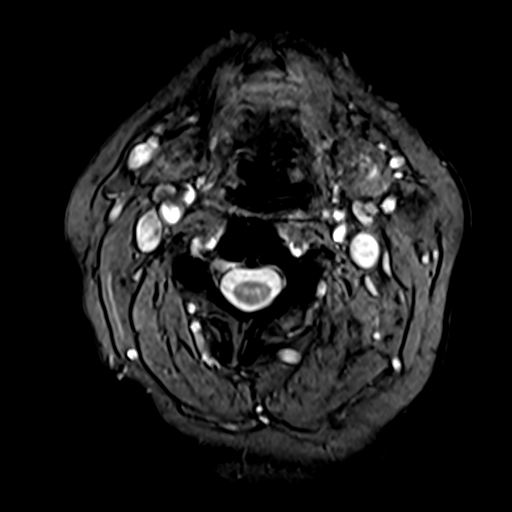
[im 30/30]
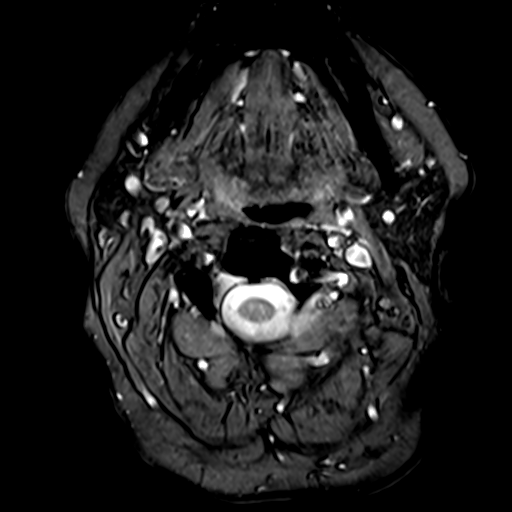

[38 of 48 positions shown; findings below may reference images not displayed]

FINDINGS: Alignment: Stable slight degenerative anterolisthesis at C4-5.

Vertebrae: No acute or suspicious osseous findings.

Cord: Normal in signal and caliber.

Posterior Fossa, vertebral arteries, paraspinal tissues: Visualized
portions of the posterior fossa appear unremarkable. No significant
paraspinal findings. There is a stable subcutaneous calcification
posteriorly on the right at C2. Bilateral vertebral artery flow
voids.

Disc levels:

C2-3: Normal interspace.

C3-4: Preserved disc height with mild uncinate spurring and mild
right-sided foraminal narrowing. The spinal canal is widely patent.

C4-5: Preserved disc height. Stable shallow central disc protrusion
and mild uncinate spurring contributing to mild right foraminal
narrowing. The spinal canal is widely patent.

C5-6: Stable loss of disc height with annular disc bulging, uncinate
spurring and facet hypertrophy. Stable moderate right and mild left
foraminal narrowing. The spinal canal is widely patent.

C6-7: Stable loss of disc height with annular disc bulging. The
previously demonstrated left foraminal disc extrusion has partially
involuted. There is only mild residual left foraminal narrowing. The
spinal canal is widely patent.

C7-T1: Mild bilateral facet hypertrophy.  The disc appears normal.
IMPRESSION: 1. Compared with the previous MRI of 8 months ago, the left
foraminal disc extrusion at C6-7 has partially involuted, and there
is only mild residual left foraminal narrowing.
2. Stable spondylosis at C5-6 with right-greater-than-left foraminal
narrowing.
3. No acute findings or central spinal stenosis.

## 2021-05-20 ENCOUNTER — Other Ambulatory Visit: Payer: Self-pay | Admitting: Nurse Practitioner

## 2021-05-20 ENCOUNTER — Other Ambulatory Visit (HOSPITAL_COMMUNITY): Payer: Self-pay | Admitting: *Deleted

## 2021-05-20 MED ORDER — FOLIC ACID 1 MG PO TABS: ORAL_TABLET | ORAL | 3 refills | Status: AC

## 2021-06-23 ENCOUNTER — Other Ambulatory Visit: Payer: Self-pay | Admitting: Nurse Practitioner

## 2021-06-23 DIAGNOSIS — F3342 Major depressive disorder, recurrent, in full remission: Secondary | ICD-10-CM

## 2021-08-28 ENCOUNTER — Other Ambulatory Visit: Payer: Self-pay | Admitting: Nurse Practitioner

## 2021-08-28 DIAGNOSIS — K529 Noninfective gastroenteritis and colitis, unspecified: Secondary | ICD-10-CM

## 2021-09-03 ENCOUNTER — Ambulatory Visit: Payer: 59 | Admitting: Physician Assistant

## 2021-09-03 ENCOUNTER — Other Ambulatory Visit: Payer: Self-pay

## 2021-09-03 ENCOUNTER — Encounter: Payer: Self-pay | Admitting: Physician Assistant

## 2021-09-03 DIAGNOSIS — Z85828 Personal history of other malignant neoplasm of skin: Secondary | ICD-10-CM

## 2021-09-03 DIAGNOSIS — Z86018 Personal history of other benign neoplasm: Secondary | ICD-10-CM

## 2021-09-03 DIAGNOSIS — C4401 Basal cell carcinoma of skin of lip: Secondary | ICD-10-CM | POA: Diagnosis not present

## 2021-09-03 DIAGNOSIS — L570 Actinic keratosis: Secondary | ICD-10-CM

## 2021-09-03 DIAGNOSIS — Z808 Family history of malignant neoplasm of other organs or systems: Secondary | ICD-10-CM

## 2021-09-03 DIAGNOSIS — Z840 Family history of diseases of the skin and subcutaneous tissue: Secondary | ICD-10-CM

## 2021-09-03 DIAGNOSIS — L821 Other seborrheic keratosis: Secondary | ICD-10-CM

## 2021-09-03 DIAGNOSIS — D485 Neoplasm of uncertain behavior of skin: Secondary | ICD-10-CM

## 2021-09-03 NOTE — Patient Instructions (Signed)

## 2021-09-04 ENCOUNTER — Encounter: Payer: Self-pay | Admitting: Physician Assistant

## 2021-09-04 NOTE — Progress Notes (Signed)
   New Patient   Subjective  Casey [name] is a 60 y.o. female who presents for the following: New Patient (Initial Visit) (Patient here today for skin check. Per patient she has a lesion above her left lip x 1 year no bleeding just tender. Personal history of atypical moles, and non mole skin cancer. Family history of melanoma and non mole skin cancer.).   The following portions of the chart were reviewed this encounter and updated as appropriate:  Tobacco  Allergies  Meds  Problems  Med Hx  Surg Hx  Fam Hx      Objective  Well appearing patient in no apparent distress; mood and affect are within normal limits.  A full examination was performed including scalp, head, eyes, ears, nose, lips, neck, chest, axillae, abdomen, back, buttocks, bilateral upper extremities, bilateral lower extremities, hands, feet, fingers, toes, fingernails, and toenails. All findings within normal limits unless otherwise noted below.  Left Upper Cutaneous Lip Pearly papule with telangectasia.      Right Malar Cheek Hyperkeratotic scale with pink base       Assessment & Plan  Neoplasm of uncertain behavior of skin (2) Left Upper Cutaneous Lip  Skin / nail biopsy Type of biopsy: tangential   Informed consent: discussed and consent obtained   Timeout: patient name, date of birth, surgical site, and procedure verified   Anesthesia: the lesion was anesthetized in a standard fashion   Anesthetic:  1% lidocaine w/ epinephrine 1-100,000 local infiltration Instrument used: flexible razor blade   Hemostasis achieved with: aluminum chloride and electrodesiccation   Outcome: patient tolerated procedure well   Post-procedure details: wound care instructions given    Specimen 1 - Surgical pathology Differential Diagnosis: bcc scc  Check Margins: No  Right Malar Cheek  Skin / nail biopsy Type of biopsy: tangential   Informed consent: discussed and consent obtained   Timeout: patient name,  date of birth, surgical site, and procedure verified   Anesthesia: the lesion was anesthetized in a standard fashion   Anesthetic:  1% lidocaine w/ epinephrine 1-100,000 local infiltration Instrument used: flexible razor blade   Hemostasis achieved with: aluminum chloride and electrodesiccation   Outcome: patient tolerated procedure well   Post-procedure details: wound care instructions given    Specimen 2 - Surgical pathology Differential Diagnosis: isk  Check Margins: No   No atypical nevi noted at the time of the visit.   I, Yesenia Locurto, PA-C, have reviewed all documentation's for this visit.  The documentation on 09/04/21 for the exam, diagnosis, procedures and orders are all accurate and complete.

## 2021-09-10 ENCOUNTER — Other Ambulatory Visit: Payer: Self-pay | Admitting: Nurse Practitioner

## 2021-09-10 DIAGNOSIS — F5101 Primary insomnia: Secondary | ICD-10-CM

## 2021-09-14 ENCOUNTER — Telehealth: Payer: Self-pay | Admitting: *Deleted

## 2021-09-14 NOTE — Telephone Encounter (Signed)
-----   Message from Warren Danes, [place] sent at 09/14/2021 11:20 AM EDT ----- 1 mohs. Recheck 6 months

## 2021-09-14 NOTE — Telephone Encounter (Signed)
Path to patient. Mohs referral sent to the skin surgery center.

## 2021-09-25 ENCOUNTER — Other Ambulatory Visit: Payer: Self-pay | Admitting: Nurse Practitioner

## 2021-09-25 DIAGNOSIS — F3342 Major depressive disorder, recurrent, in full remission: Secondary | ICD-10-CM

## 2021-09-28 ENCOUNTER — Telehealth: Payer: 59 | Admitting: Physician Assistant

## 2021-09-28 DIAGNOSIS — B9689 Other specified bacterial agents as the cause of diseases classified elsewhere: Secondary | ICD-10-CM | POA: Diagnosis not present

## 2021-09-28 DIAGNOSIS — J019 Acute sinusitis, unspecified: Secondary | ICD-10-CM | POA: Diagnosis not present

## 2021-09-28 MED ORDER — DOXYCYCLINE HYCLATE 100 MG PO TABS: 100.0000 mg | ORAL_TABLET | Freq: Two times a day (BID) | ORAL | 0 refills | Status: DC

## 2021-09-28 NOTE — Progress Notes (Signed)
I have spent 5 minutes in review of e-visit questionnaire, review and updating patient chart, medical decision making and response to patient.   Sapir Lavey Cody Ron Junco, PA-C    

## 2021-09-28 NOTE — Progress Notes (Signed)

## 2021-09-29 ENCOUNTER — Other Ambulatory Visit: Payer: Self-pay | Admitting: Nurse Practitioner

## 2021-09-29 MED ORDER — LEVOFLOXACIN 500 MG PO TABS: 500.0000 mg | ORAL_TABLET | Freq: Every day | ORAL | 0 refills | Status: DC

## 2021-10-09 ENCOUNTER — Other Ambulatory Visit: Payer: Self-pay

## 2021-10-09 ENCOUNTER — Encounter: Payer: Self-pay | Admitting: Nurse Practitioner

## 2021-10-09 ENCOUNTER — Ambulatory Visit: Payer: 59 | Admitting: Nurse Practitioner

## 2021-10-09 VITALS — BP 125/76 | HR 61 | Temp 97.8°F | Resp 20 | Ht 69.0 in | Wt 200.0 lb

## 2021-10-09 DIAGNOSIS — E039 Hypothyroidism, unspecified: Secondary | ICD-10-CM

## 2021-10-09 DIAGNOSIS — I10 Essential (primary) hypertension: Secondary | ICD-10-CM | POA: Diagnosis not present

## 2021-10-09 DIAGNOSIS — E7800 Pure hypercholesterolemia, unspecified: Secondary | ICD-10-CM

## 2021-10-09 DIAGNOSIS — Z23 Encounter for immunization: Secondary | ICD-10-CM | POA: Diagnosis not present

## 2021-10-09 DIAGNOSIS — K591 Functional diarrhea: Secondary | ICD-10-CM | POA: Insufficient documentation

## 2021-10-09 DIAGNOSIS — F5101 Primary insomnia: Secondary | ICD-10-CM

## 2021-10-09 DIAGNOSIS — K529 Noninfective gastroenteritis and colitis, unspecified: Secondary | ICD-10-CM

## 2021-10-09 DIAGNOSIS — F3342 Major depressive disorder, recurrent, in full remission: Secondary | ICD-10-CM

## 2021-10-09 DIAGNOSIS — K219 Gastro-esophageal reflux disease without esophagitis: Secondary | ICD-10-CM | POA: Diagnosis not present

## 2021-10-09 MED ORDER — PANTOPRAZOLE SODIUM 40 MG PO TBEC: 40.0000 mg | DELAYED_RELEASE_TABLET | Freq: Every day | ORAL | 1 refills | Status: DC | PRN

## 2021-10-09 MED ORDER — VALSARTAN-HYDROCHLOROTHIAZIDE 160-12.5 MG PO TABS: ORAL_TABLET | ORAL | 1 refills | Status: DC

## 2021-10-09 MED ORDER — DIPHENOXYLATE-ATROPINE 2.5-0.025 MG PO TABS: ORAL_TABLET | ORAL | 1 refills | Status: DC

## 2021-10-09 MED ORDER — METOPROLOL SUCCINATE ER 50 MG PO TB24: ORAL_TABLET | ORAL | 1 refills | Status: DC

## 2021-10-09 MED ORDER — ESCITALOPRAM OXALATE 10 MG PO TABS: 10.0000 mg | ORAL_TABLET | Freq: Every day | ORAL | 1 refills | Status: DC

## 2021-10-09 MED ORDER — ZOLPIDEM TARTRATE 10 MG PO TABS: 10.0000 mg | ORAL_TABLET | Freq: Every evening | ORAL | 5 refills | Status: DC | PRN

## 2021-10-09 MED ORDER — LEVOTHYROXINE SODIUM 150 MCG PO TABS: 150.0000 ug | ORAL_TABLET | Freq: Every day | ORAL | 1 refills | Status: DC

## 2021-10-09 NOTE — Progress Notes (Signed)
Subjective:    Patient ID: Casey [name], female    DOB: May 22, 1961, 60 y.o.   MRN: [id number]   Chief Complaint: medical management of chronic issues     HPI:  1. Primary hypertension No c/o chest pain, sob or headache. Does not check blood pressure at home. BP Readings from Last 3 Encounters:  04/08/21 119/69  09/18/20 113/78  08/07/20 133/78     2. Pure hypercholesterolemia Does try to watch diet but doe snot do much dedicated exercise. Lab Results  Component Value Date   CHOL 149 04/08/2021   HDL 58 04/08/2021   LDLCALC 69 04/08/2021   TRIG 127 04/08/2021   CHOLHDL 2.6 04/08/2021     3. Acquired hypothyroidism No problems that she is aware of. Lab Results  Component Value Date   TSH 2.500 04/08/2021     4. Gastroesophageal reflux disease without esophagitis I son protonix daily and that works well most days to prevent symptoms.  5. Recurrent major depressive disorder, in full remission [facility]) She is on lexapro daily and is doing well. GAD 7 : Generalized Anxiety Score 10/09/2021 04/08/2021 09/18/2020 08/07/2020  Nervous, Anxious, on Edge 1 1 0 1  Control/stop worrying 1 0 0 0  Worry too much - different things 0 0 0 0  Trouble relaxing 1 0 0 0  Restless 0 0 0 0  Easily annoyed or irritable 1 2 0 0  Afraid - awful might happen 0 0 0 0  Total GAD 7 Score 4 3 0 1  Anxiety Difficulty Somewhat difficult Somewhat difficult Not difficult at all -    Depression screen [facility] 2/9 10/09/2021 04/08/2021 09/18/2020  Decreased Interest 0 1 0  Down, Depressed, Hopeless 0 3 0  PHQ - 2 Score 0 4 0  Altered sleeping 0 0 0  Tired, decreased energy 0 3 0  Change in appetite 0 0 0  Feeling bad or failure about yourself  0 0 0  Trouble concentrating 1 0 0  Moving slowly or fidgety/restless 0 2 0  Suicidal thoughts 0 0 0  PHQ-9 Score 1 9 0  Difficult doing work/chores Somewhat difficult Somewhat difficult Not difficult at all     6. insomnia Is on ambien to sleep  at night. Sleeps about 7-8 hours a night  7. 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 She has to take lomotil to control it most days. Has gotten some better over the last several months.   8. Morbid obesity ([place]) Weight is down 6 lbs Wt Readings from Last 3 Encounters:  10/09/21 200 lb (90.7 kg)  04/08/21 206 lb (93.4 kg)  09/18/20 206 lb (93.4 kg)   BMI Readings from Last 3 Encounters:  10/09/21 29.53 kg/m  04/08/21 30.42 kg/m  09/18/20 30.42 kg/m       Outpatient Encounter Medications as of 10/09/2021  Medication Sig   cetirizine (ZYRTEC) 10 MG tablet TAKE ONE (1) TABLET EACH DAY   cyanocobalamin (,VITAMIN B-12,) 1000 MCG/ML injection 84ml every 14 days   diphenoxylate-atropine (LOMOTIL) 2.5-0.025 MG tablet TAKE 2 TABLETS BY MOUTH FOUR TIMES A DAY   escitalopram (LEXAPRO) 10 MG tablet TAKE ONE (1) TABLET EACH DAY   fenofibrate 160 MG tablet TAKE ONE (1) TABLET EACH DAY (Needs to be seen before next refill)   folic acid (FOLVITE) 1 MG tablet TAKE ONE (1) TABLET EACH DAY   levofloxacin (LEVAQUIN) 500 MG tablet Take  1 tablet (500 mg total) by mouth daily.   levothyroxine (SYNTHROID) 150 MCG tablet Take 1 tablet (150 mcg total) by mouth daily.   metoprolol succinate (TOPROL-XL) 50 MG 24 hr tablet TAKE ONE (1) TABLET EACH DAY (Needs to be seen before next refill)   ofloxacin (OCUFLOX) 0.3 % ophthalmic solution    pantoprazole (PROTONIX) 40 MG tablet Take 1 tablet (40 mg total) by mouth daily as needed. (Needs to be seen before next refill)   promethazine (PHENERGAN) 25 MG tablet TAKE 1 TABLET EVERY 6 HOURS AS NEEDED FOR NAUSEA AND VOMITING   RESTASIS 0.05 % ophthalmic emulsion 2 drops in the morning and at bedtime.   valsartan-hydrochlorothiazide (DIOVAN-HCT) 160-12.5 MG tablet TAKE ONE (1) TABLET EACH DAY (Needs to be seen before next refill)   zolpidem (AMBIEN) 10 MG tablet Take 1 tablet (10 mg total) by  mouth at bedtime as needed. for sleep   No facility-administered encounter medications on file as of 10/09/2021.    Past Surgical History:  Procedure Laterality Date   APPENDECTOMY     CRYOTHERAPY     for abnormal pap   DIAGNOSTIC LAPAROSCOPY     DILATION AND CURETTAGE OF UTERUS     infertility testing      LAPAROSCOPIC PARTIAL COLECTOMY N/A 02/02/2016   Procedure: LAPAROSCOPIC PARTIAL COlectomy right colon;  Surgeon: Greer Pickerel, MD;  Location: [facility];  Service: General;  Laterality: N/A;   OOPHORECTOMY     & fallopian tubes   TONSILLECTOMY     VAGINAL HYSTERECTOMY     total   VESICOVAGINAL FISTULA CLOSURE W/ TAH     Dr. Margaretha Glassing  2 degree DUB     Family History  Problem Relation Age of Onset   Hypertension Mother    Irregular heart beat Mother    Thyroid disease Maternal Grandmother    Cancer Maternal Grandmother        lung   Cancer Paternal Grandfather        lung    New complaints: None today  Social history: Lives with her husband and her daughter and grandson lives with her.  Controlled substance contract: n/a     Review of Systems  Constitutional:  Negative for diaphoresis.  Eyes:  Negative for pain.  Respiratory:  Negative for shortness of breath.   Cardiovascular:  Negative for chest pain, palpitations and leg swelling.  Gastrointestinal:  Negative for abdominal pain.  Endocrine: Negative for polydipsia.  Skin:  Negative for rash.  Neurological:  Negative for dizziness, weakness and headaches.  Hematological:  Does not bruise/bleed easily.  All other systems reviewed and are negative.     Objective:   Physical Exam Vitals and nursing note reviewed.  Constitutional:      General: She is not in acute distress.    Appearance: Normal appearance. She is well-developed.  HENT:     Head: Normocephalic.     Right Ear: Tympanic membrane normal.     Left Ear: Tympanic membrane normal.     Nose: Nose normal.     Mouth/Throat:     Mouth: Mucous  membranes are moist.  Eyes:     Pupils: Pupils are equal, round, and reactive to light.  Neck:     Vascular: No carotid bruit or JVD.  Cardiovascular:     Rate and Rhythm: Normal rate and regular rhythm.     Heart sounds: Normal heart sounds.  Pulmonary:     Effort: Pulmonary effort is normal. No respiratory  distress.     Breath sounds: Normal breath sounds. No wheezing or rales.  Chest:     Chest wall: No tenderness.  Abdominal:     General: Bowel sounds are normal. There is no distension or abdominal bruit.     Palpations: Abdomen is soft. There is no hepatomegaly, splenomegaly, mass or pulsatile mass.     Tenderness: There is no abdominal tenderness.  Musculoskeletal:        General: Normal range of motion.     Cervical back: Normal range of motion and neck supple.  Lymphadenopathy:     Cervical: No cervical adenopathy.  Skin:    General: Skin is warm and dry.  Neurological:     Mental Status: She is alert and oriented to person, place, and time.     Deep Tendon Reflexes: Reflexes are normal and symmetric.  Psychiatric:        Behavior: Behavior normal.        Thought Content: Thought content normal.        Judgment: Judgment normal.    BP 125/76   Pulse 61   Temp 97.8 F (36.6 C) (Temporal)   Resp 20   Ht $R'5\' 9"'JO$  (1.753 m)   Wt 200 lb (90.7 kg)   LMP 05/23/1995   SpO2 95%   BMI 29.53 kg/m        Assessment & Plan:   KARISS LONGMIRE comes in today with chief complaint of Medical Management of Chronic Issues   Diagnosis and orders addressed:  1. Primary hypertension Low sodium diet - CBC with Differential/Platelet - CMP14+EGFR - valsartan-hydrochlorothiazide (DIOVAN-HCT) 160-12.5 MG tablet; TAKE ONE (1) TABLET EACH DAY (Needs to be seen before next refill)  Dispense: 90 tablet; Refill: 1 - metoprolol succinate (TOPROL-XL) 50 MG 24 hr tablet; TAKE ONE (1) TABLET EACH DAY (Needs to be seen before next refill)  Dispense: 90 tablet; Refill: 1  2. Pure  hypercholesterolemia Low fat diet - Lipid panel  3. Acquired hypothyroidism Labs pending - Thyroid Panel With TSH - levothyroxine (SYNTHROID) 150 MCG tablet; Take 1 tablet (150 mcg total) by mouth daily.  Dispense: 90 tablet; Refill: 1  4. Gastroesophageal reflux disease without esophagitis Avoid spicy foods Do not eat 2 hours prior to bedtime - pantoprazole (PROTONIX) 40 MG tablet; Take 1 tablet (40 mg total) by mouth daily as needed. (Needs to be seen before next refill)  Dispense: 90 tablet; Refill: 1  5. Recurrent major depressive disorder, in full remission ([place]) Stress management - escitalopram (LEXAPRO) 10 MG tablet; Take 1 tablet (10 mg total) by mouth daily.  Dispense: 90 tablet; Refill: 1  6. Morbid obesity ([place]) Discussed diet and exercise for person with BMI >25 Will recheck weight in 3-6 months   7. Primary insomnia Bedtime routine - zolpidem (AMBIEN) 10 MG tablet; Take 1 tablet (10 mg total) by mouth at bedtime as needed. for sleep  Dispense: 30 tablet; Refill: 5  8. Chronic diarrhea Continue lomotil as needed - diphenoxylate-atropine (LOMOTIL) 2.5-0.025 MG tablet; TAKE 2 TABLETS BY MOUTH FOUR TIMES A DAY  Dispense: 120 tablet; Refill: 1   Labs pending Health Maintenance reviewed Diet and exercise encouraged  Follow up plan: 6 months   [name] Hassell Done, FNP

## 2021-10-09 NOTE — Addendum Note (Signed)
Addended by: Chevis Pretty on: 10/09/2021 04:17 PM   Modules accepted: Orders

## 2021-10-10 ENCOUNTER — Other Ambulatory Visit: Payer: Self-pay | Admitting: Nurse Practitioner

## 2021-10-10 DIAGNOSIS — Z1231 Encounter for screening mammogram for malignant neoplasm of breast: Secondary | ICD-10-CM

## 2021-10-10 LAB — CMP14+EGFR
ALT: 12 IU/L (ref 0–32)
AST: 17 IU/L (ref 0–40)
Albumin/Globulin Ratio: 2 (ref 1.2–2.2)
Albumin: 4.4 g/dL (ref 3.8–4.9)
Alkaline Phosphatase: 51 IU/L (ref 44–121)
BUN/Creatinine Ratio: 20 (ref 12–28)
BUN: 22 mg/dL (ref 8–27)
Bilirubin Total: <0.2 mg/dL (ref 0.0–1.2)
CO2: 28 mmol/L (ref 20–29)
Calcium: 9.5 mg/dL (ref 8.7–10.3)
Chloride: 104 mmol/L (ref 96–106)
Creatinine, Ser: 1.09 mg/dL — ABNORMAL HIGH (ref 0.57–1.00)
Globulin, Total: 2.2 g/dL (ref 1.5–4.5)
Glucose: 92 mg/dL (ref 70–99)
Potassium: 3.8 mmol/L (ref 3.5–5.2)
Sodium: 145 mmol/L — ABNORMAL HIGH (ref 134–144)
Total Protein: 6.6 g/dL (ref 6.0–8.5)
eGFR: 58 mL/min/{1.73_m2} — ABNORMAL LOW (ref >59)

## 2021-10-10 LAB — CBC WITH DIFFERENTIAL/PLATELET
Basophils Absolute: 0.1 10*3/uL (ref 0.0–0.2)
Basos: 1 %
EOS (ABSOLUTE): 0.2 10*3/uL (ref 0.0–0.4)
Eos: 2 %
Hematocrit: 36.7 % (ref 34.0–46.6)
Hemoglobin: 12.2 g/dL (ref 11.1–15.9)
Immature Grans (Abs): 0 10*3/uL (ref 0.0–0.1)
Immature Granulocytes: 0 %
Lymphocytes Absolute: 3.3 10*3/uL — ABNORMAL HIGH (ref 0.7–3.1)
Lymphs: 38 %
MCH: 29 pg (ref 26.6–33.0)
MCHC: 33.2 g/dL (ref 31.5–35.7)
MCV: 87 fL (ref 79–97)
Monocytes Absolute: 0.9 10*3/uL (ref 0.1–0.9)
Monocytes: 10 %
Neutrophils Absolute: 4.2 10*3/uL (ref 1.4–7.0)
Neutrophils: 49 %
Platelets: 246 10*3/uL (ref 150–450)
RBC: 4.21 x10E6/uL (ref 3.77–5.28)
RDW: 12.3 % (ref 11.7–15.4)
WBC: 8.7 10*3/uL (ref 3.4–10.8)

## 2021-10-10 LAB — THYROID PANEL WITH TSH
Free Thyroxine Index: 3.4 (ref 1.2–4.9)
T3 Uptake Ratio: 30 % (ref 24–39)
T4, Total: 11.2 ug/dL (ref 4.5–12.0)
TSH: 1.36 u[IU]/mL (ref 0.450–4.500)

## 2021-10-10 LAB — LIPID PANEL
Chol/HDL Ratio: 2.4 ratio (ref 0.0–4.4)
Cholesterol, Total: 132 mg/dL (ref 100–199)
HDL: 55 mg/dL (ref >39)
LDL Chol Calc (NIH): 62 mg/dL (ref 0–99)
Triglycerides: 74 mg/dL (ref 0–149)
VLDL Cholesterol Cal: 15 mg/dL (ref 5–40)

## 2021-10-22 ENCOUNTER — Other Ambulatory Visit: Payer: Self-pay | Admitting: Nurse Practitioner

## 2021-10-22 DIAGNOSIS — E7800 Pure hypercholesterolemia, unspecified: Secondary | ICD-10-CM

## 2021-11-17 ENCOUNTER — Other Ambulatory Visit: Payer: Self-pay | Admitting: Nurse Practitioner

## 2021-12-02 ENCOUNTER — Ambulatory Visit
Admission: RE | Admit: 2021-12-02 | Discharge: 2021-12-02 | Disposition: A | Discharge status: Home / Self Care | Payer: 59 | Source: Ambulatory Visit | Attending: Nurse Practitioner | Admitting: Nurse Practitioner

## 2021-12-02 DIAGNOSIS — Z1231 Encounter for screening mammogram for malignant neoplasm of breast: Secondary | ICD-10-CM

## 2021-12-02 IMAGING — MG MM DIGITAL SCREENING BILAT W/ TOMO AND CAD
8 series · 8 of 24 positions shown · non-contrast
Comparison: Previous exam(s).

CLINICAL DATA: Screening.

EXAM:
DIGITAL SCREENING BILATERAL MAMMOGRAM WITH TOMOSYNTHESIS AND CAD
TECHNIQUE: Bilateral screening digital craniocaudal and mediolateral oblique
mammograms were obtained. Bilateral screening digital breast
tomosynthesis was performed. The images were evaluated with
computer-aided detection.

[L CC synth-2D]
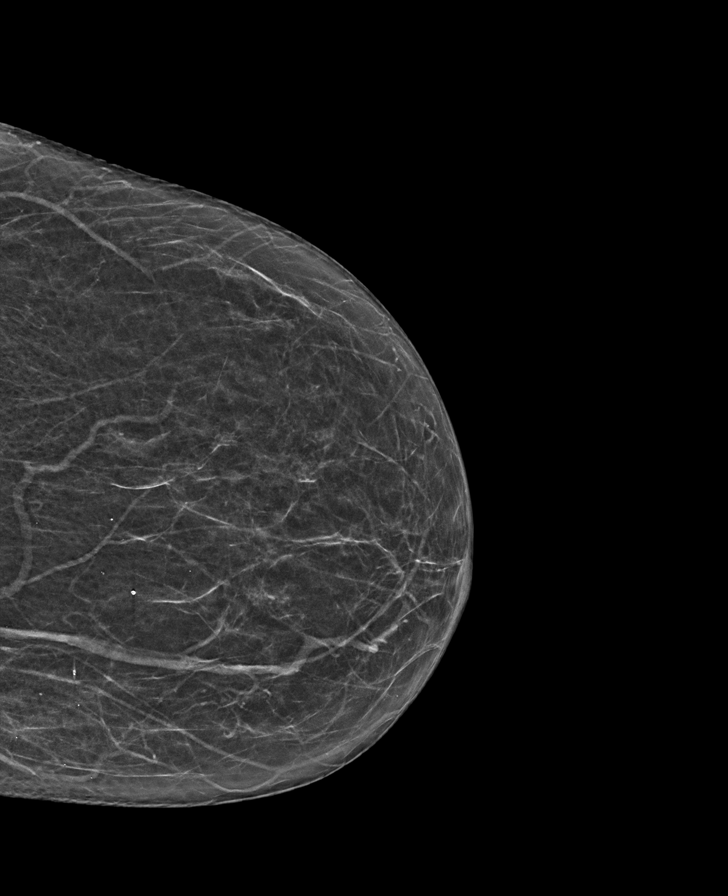

[L MLO synth-2D]
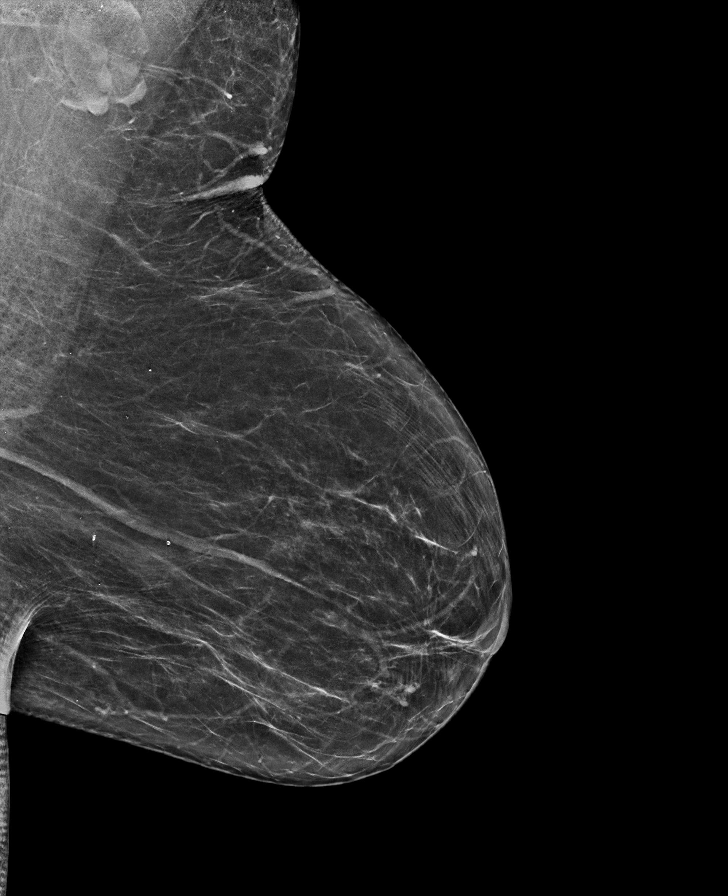

[R CC synth-2D]
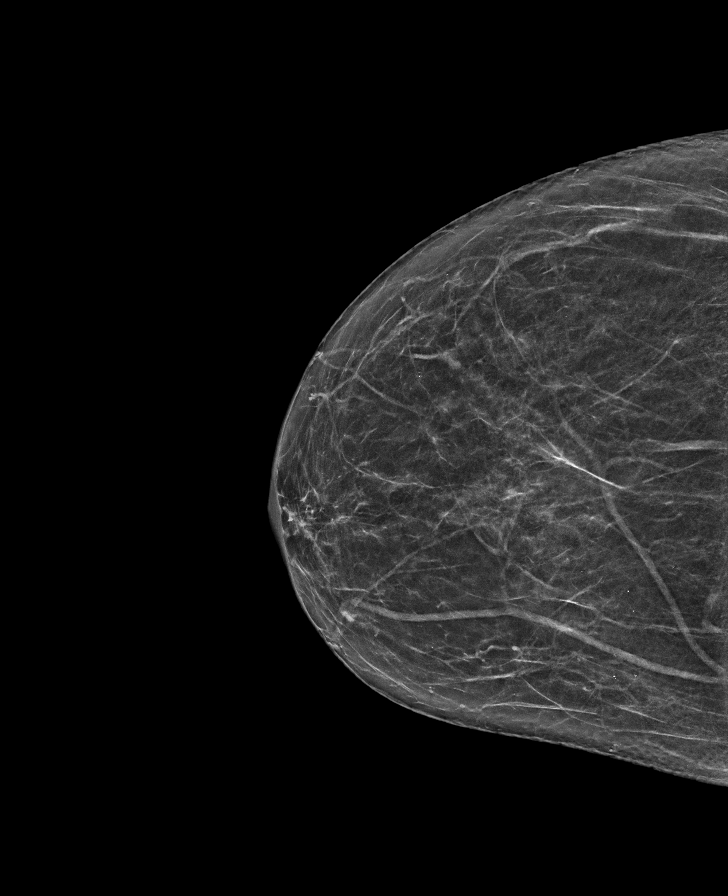

[R MLO synth-2D]
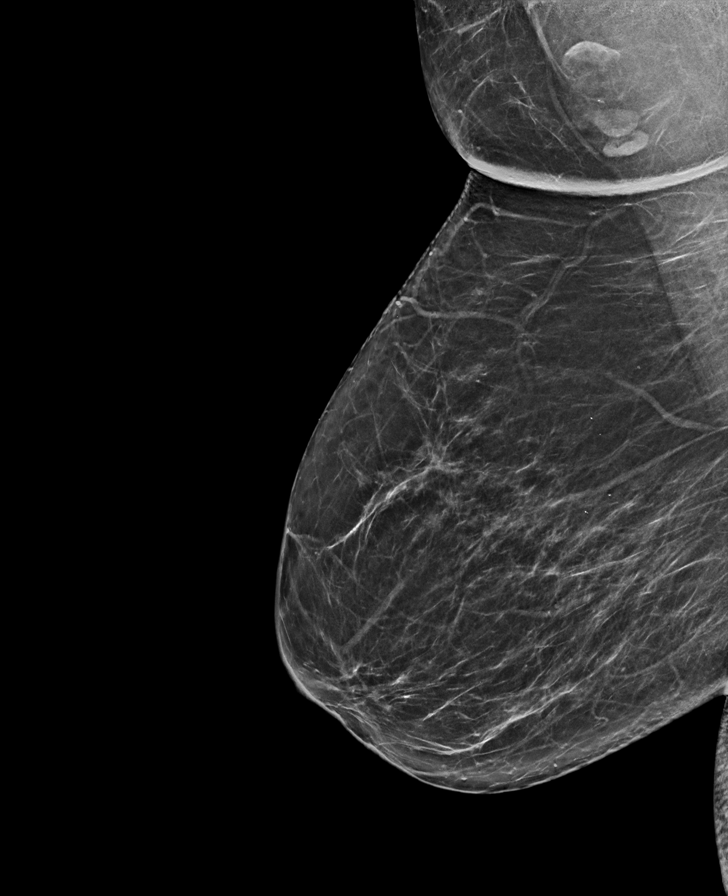

[L CC tomo · tomo slice 29/57.0]
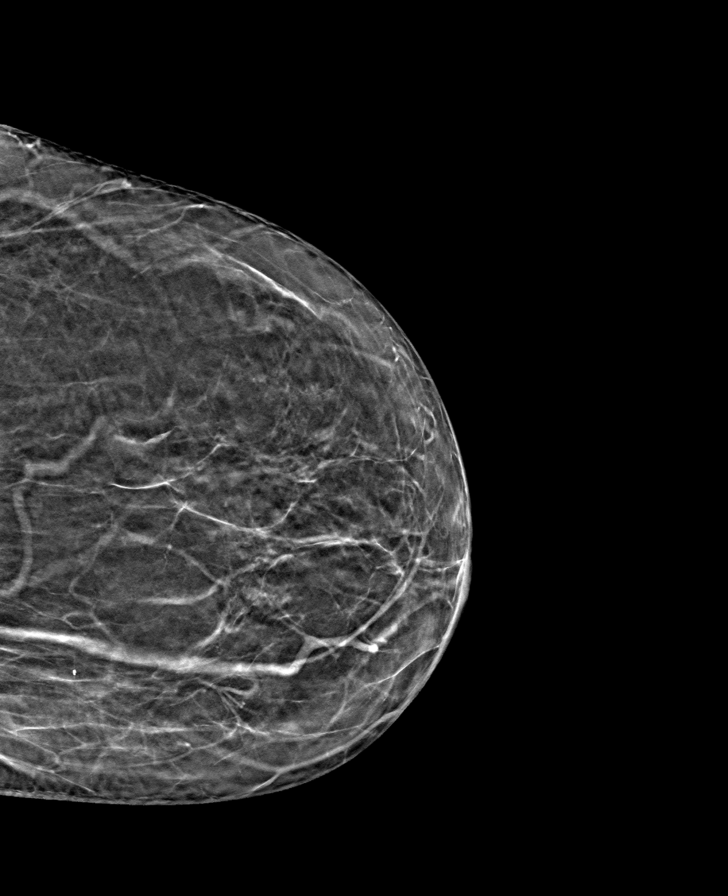

[R CC tomo · tomo slice 31/62.0]
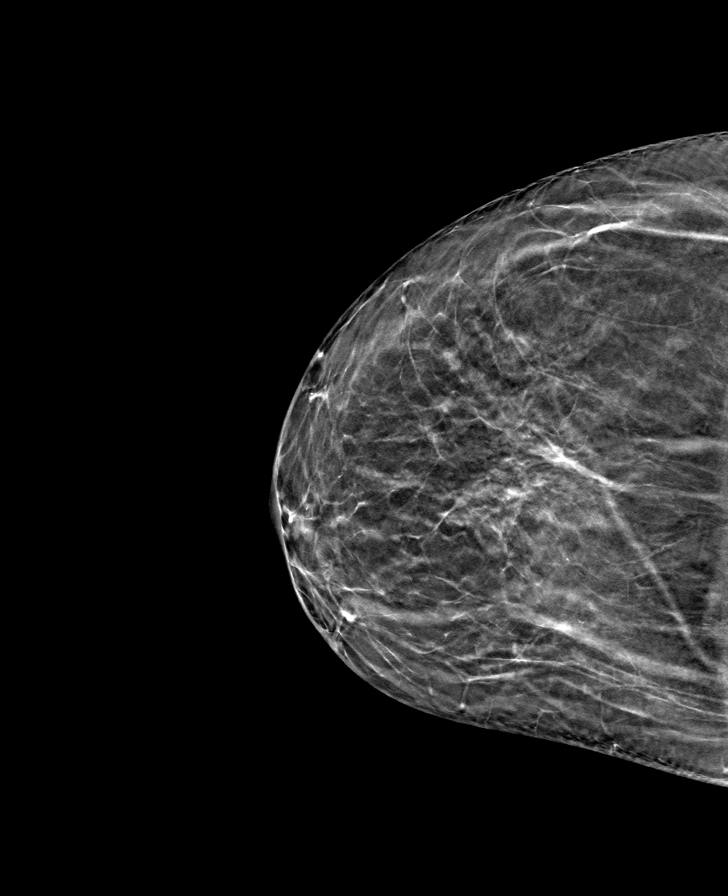

[L MLO tomo · tomo slice 37/72.0]
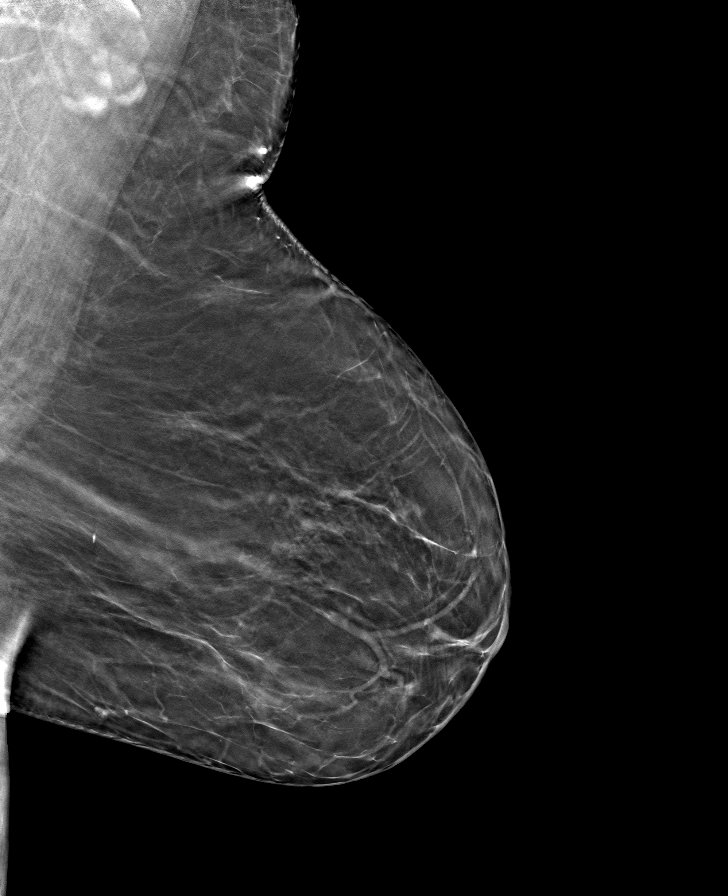

[R MLO tomo · tomo slice 33/65.0]
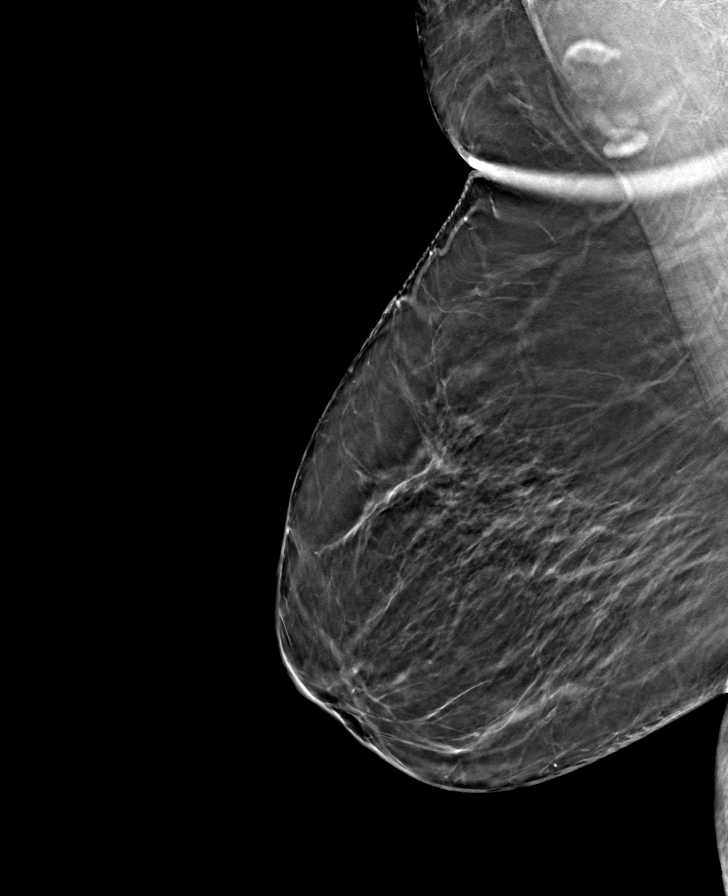

[8 of 24 positions shown; findings below may reference images not displayed]

ACR Breast Density Category b: There are scattered areas of
fibroglandular density.
FINDINGS: There are no findings suspicious for malignancy.
IMPRESSION: No mammographic evidence of malignancy. A result letter of this
screening mammogram will be mailed directly to the patient.

RECOMMENDATION:
Screening mammogram in one year. (Code:[BY])

BI-RADS CATEGORY  1: Negative.

## 2021-12-29 ENCOUNTER — Telehealth (INDEPENDENT_AMBULATORY_CARE_PROVIDER_SITE_OTHER): Payer: 59 | Admitting: Nurse Practitioner

## 2021-12-29 ENCOUNTER — Encounter: Payer: Self-pay | Admitting: Nurse Practitioner

## 2021-12-29 DIAGNOSIS — U071 COVID-19: Secondary | ICD-10-CM

## 2021-12-29 NOTE — Progress Notes (Signed)
Virtual Visit Consent   Casey [name], you are scheduled for a virtual visit with [name] Hassell Done, [place], a [facility] provider, today.     Just as with appointments in the office, your consent must be obtained to participate.  Your consent will be active for this visit and any virtual visit you may have with one of our providers in the next 365 days.     If you have a MyChart account, a copy of this consent can be sent to you electronically.  All virtual visits are billed to your insurance company just like a traditional visit in the office.    As this is a virtual visit, video technology does not allow for your provider to perform a traditional examination.  This may limit your provider's ability to fully assess your condition.  If your provider identifies any concerns that need to be evaluated in person or the need to arrange testing (such as labs, EKG, etc.), we will make arrangements to do so.     Although advances in technology are sophisticated, we cannot ensure that it will always work on either your end or our end.  If the connection with a video visit is poor, the visit may have to be switched to a telephone visit.  With either a video or telephone visit, we are not always able to ensure that we have a secure connection.     I need to obtain your verbal consent now.   Are you willing to proceed with your visit today? YES   Casey [name] has provided verbal consent on 12/29/2021 for a virtual visit (video or telephone).   [name] Hassell Done, FNP   Date: 12/29/2021 1:15 PM   Virtual Visit via Video Note   I, [name] Hassell Done, connected with Casey [name] ([id number], 04-16-1961) on 12/29/21 at  3:00 PM EST by a video-enabled telemedicine application and verified that I am speaking with the correct person using two identifiers.  Location: Patient: Virtual Visit Location Patient: Home Provider: Virtual Visit Location Provider: Mobile   I discussed the limitations of  evaluation and management by telemedicine and the availability of in person appointments. The patient expressed understanding and agreed to proceed.    History of Present Illness: Casey [name] is a 61 y.o. who identifies as a female who was assigned female at birth, and is being seen today for covid positive .  HPI: URI  This is a new problem. The current episode started yesterday. The problem has been gradually worsening. There has been no fever. Associated symptoms include congestion, coughing, headaches, rhinorrhea and a sore throat. She has tried decongestant for the symptoms. The treatment provided mild relief.  Tested positive for covid last night Review of Systems  HENT:  Positive for congestion, rhinorrhea and sore throat.   Respiratory:  Positive for cough.   Neurological:  Positive for headaches.   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   GERD (gastroesophageal reflux disease) 03/18/2014   Depression 03/18/2014   Hypertension 03/28/2013   Hyperlipidemia 03/28/2013   Hypothyroidism 03/28/2013   Graves' disease     Allergies:  Allergies  Allergen Reactions   Adhesive  [Tape] Itching    BANDAIDS   Benzalkonium    Buprenorphine Hcl Itching   Labetalol Itching  Morphine And Related Itching   Other     [place] seafood seasoning    Wound Dressings    Zosyn [Piperacillin Sod-Tazobactam So] Rash    Pt states has had dose since and did not have difficulty, rash only on first dose , not in doses following    Medications:  Current Outpatient Medications:    cetirizine (ZYRTEC) 10 MG tablet, TAKE ONE (1) TABLET EACH DAY, Disp: 90 tablet, Rfl: 3   cyanocobalamin (,VITAMIN B-12,) 1000 MCG/ML injection, 60ml every 14 days, Disp: 30 mL, Rfl: 1   diphenoxylate-atropine (LOMOTIL)  2.5-0.025 MG tablet, TAKE 2 TABLETS BY MOUTH FOUR TIMES A DAY, Disp: 120 tablet, Rfl: 1   escitalopram (LEXAPRO) 10 MG tablet, Take 1 tablet (10 mg total) by mouth daily., Disp: 90 tablet, Rfl: 1   fenofibrate 160 MG tablet, TAKE ONE (1) TABLET EACH DAY, Disp: 90 tablet, Rfl: 1   folic acid (FOLVITE) 1 MG tablet, TAKE ONE (1) TABLET EACH DAY, Disp: 90 tablet, Rfl: 3   levothyroxine (SYNTHROID) 150 MCG tablet, Take 1 tablet (150 mcg total) by mouth daily., Disp: 90 tablet, Rfl: 1   metoprolol succinate (TOPROL-XL) 50 MG 24 hr tablet, TAKE ONE (1) TABLET EACH DAY (Needs to be seen before next refill), Disp: 90 tablet, Rfl: 1   pantoprazole (PROTONIX) 40 MG tablet, Take 1 tablet (40 mg total) by mouth daily as needed. (Needs to be seen before next refill), Disp: 90 tablet, Rfl: 1   promethazine (PHENERGAN) 25 MG tablet, TAKE 1 TABLET EVERY 6 HOURS AS NEEDED FOR NAUSEA AND VOMITING, Disp: 30 tablet, Rfl: 0   RESTASIS 0.05 % ophthalmic emulsion, 2 drops in the morning and at bedtime., Disp: , Rfl:    valsartan-hydrochlorothiazide (DIOVAN-HCT) 160-12.5 MG tablet, TAKE ONE (1) TABLET EACH DAY (Needs to be seen before next refill), Disp: 90 tablet, Rfl: 1   zolpidem (AMBIEN) 10 MG tablet, Take 1 tablet (10 mg total) by mouth at bedtime as needed. for sleep, Disp: 30 tablet, Rfl: 5  Observations/Objective: Patient is well-developed, well-nourished in no acute distress.  Resting comfortably  at home.  Head is normocephalic, atraumatic.  No labored breathing.  Speech is clear and coherent with logical content.  Patient is alert and oriented at baseline.  Raspy voice No cough during visit  Assessment and Plan:  Casey [name] in today with chief complaint of covid positive  1. Positive self-administered antigen test for COVID-19 1. Take meds as prescribed 2. Use a cool mist humidifier especially during the winter months and when heat has been humid. 3. Use saline nose sprays frequently 4. Saline  irrigations of the nose can be very helpful if done frequently.  * 4X daily for 1 week*  * Use of a nettie pot can be helpful with this. Follow directions with this* 5. Drink plenty of fluids 6. Keep thermostat turn down low 7.For any cough or congestion- delsym 8. For fever or aces or pains- take tylenol or ibuprofen appropriate for age and weight.  * for fevers greater than 101 orally you may alternate ibuprofen and tylenol every  3 hours. Does not want to do antiviral if does not have to. Will call back if symptoms worsen.   - Novel Coronavirus, NAA ([facility]) Patient coming in for PCR test - has to have for work    Follow Up Instructions: I discussed the assessment and treatment plan with the patient. The patient was provided an opportunity to ask questions and all  were answered. The patient agreed with the plan and demonstrated an understanding of the instructions.  A copy of instructions were sent to the patient via MyChart.  The patient was advised to call back or seek an in-person evaluation if the symptoms worsen or if the condition fails to improve as anticipated.  Time:  I spent 11 minutes with the patient via telehealth technology discussing the above problems/concerns.    [name] Hassell Done, FNP

## 2021-12-29 NOTE — Patient Instructions (Signed)
COVID-19: Quarantine and Isolation °Quarantine °If you were exposed °Quarantine and stay away from others when you have been in close contact with someone who has COVID-19. °Isolate °If you are sick or test positive °Isolate when you are sick or when you have COVID-19, even if you don't have symptoms. °When to stay home °Calculating quarantine °The date of your exposure is considered day 0. Day 1 is the first full day after your last contact with a person who has had COVID-19. Stay home and away from other people for at least 5 days. Learn why CDC updated guidance for the general public. °IF YOU were exposed to COVID-19 and are NOT  °up to dateIF YOU were exposed to COVID-19 and are NOT on COVID-19 vaccinations °Quarantine for at least 5 days °Stay home °Stay home and quarantine for at least 5 full days. °Wear a well-fitting mask if you must be around others in your home. °Do not travel. °Get tested °Even if you don't develop symptoms, get tested at least 5 days after you last had close contact with someone with COVID-19. °After quarantine °Watch for symptoms °Watch for symptoms until 10 days after you last had close contact with someone with COVID-19. °Avoid travel °It is best to avoid travel until a full 10 days after you last had close contact with someone with COVID-19. °If you develop symptoms °Isolate immediately and get tested. Continue to stay home until you know the results. Wear a well-fitting mask around others. °Take precautions until day 10 °Wear a well-fitting mask °Wear a well-fitting mask for 10 full days any time you are around others inside your home or in public. Do not go to places where you are unable to wear a well-fitting mask. °If you must travel during days 6-10, take precautions. °Avoid being around people who are more likely to get very sick from COVID-19. °IF YOU were exposed to COVID-19 and are  °up to dateIF YOU were exposed to COVID-19 and are on COVID-19 vaccinations °No  quarantine °You do not need to stay home unless you develop symptoms. °Get tested °Even if you don't develop symptoms, get tested at least 5 days after you last had close contact with someone with COVID-19. °Watch for symptoms °Watch for symptoms until 10 days after you last had close contact with someone with COVID-19. °If you develop symptoms °Isolate immediately and get tested. Continue to stay home until you know the results. Wear a well-fitting mask around others. °Take precautions until day 10 °Wear a well-fitting mask °Wear a well-fitting mask for 10 full days any time you are around others inside your home or in public. Do not go to places where you are unable to wear a well-fitting mask. °Take precautions if traveling °Avoid being around people who are more likely to get very sick from COVID-19. °IF YOU were exposed to COVID-19 and had confirmed COVID-19 within the past 90 days (you tested positive using a viral test) °No quarantine °You do not need to stay home unless you develop symptoms. °Watch for symptoms °Watch for symptoms until 10 days after you last had close contact with someone with COVID-19. °If you develop symptoms °Isolate immediately and get tested. Continue to stay home until you know the results. Wear a well-fitting mask around others. °Take precautions until day 10 °Wear a well-fitting mask °Wear a well-fitting mask for 10 full days any time you are around others inside your home or in public. Do not go to places where you are   unable to wear a well-fitting mask. °Take precautions if traveling °Avoid being around people who are more likely to get very sick from COVID-19. °Calculating isolation °Day 0 is your first day of symptoms or a positive viral test. Day 1 is the first full day after your symptoms developed or your test specimen was collected. If you have COVID-19 or have symptoms, isolate for at least 5 days. °IF YOU tested positive for COVID-19 or have symptoms, regardless of  vaccination status °Stay home for at least 5 days °Stay home for 5 days and isolate from others in your home. °Wear a well-fitting mask if you must be around others in your home. °Do not travel. °Ending isolation if you had symptoms °End isolation after 5 full days if you are fever-free for 24 hours (without the use of fever-reducing medication) and your symptoms are improving. °Ending isolation if you did NOT have symptoms °End isolation after at least 5 full days after your positive test. °If you got very sick from COVID-19 or have a weakened immune system °You should isolate for at least 10 days. Consult your doctor before ending isolation. °Take precautions until day 10 °Wear a well-fitting mask °Wear a well-fitting mask for 10 full days any time you are around others inside your home or in public. Do not go to places where you are unable to wear a well-fitting mask. °Do not travel °Do not travel until a full 10 days after your symptoms started or the date your positive test was taken if you had no symptoms. °Avoid being around people who are more likely to get very sick from COVID-19. °Definitions °Exposure °Contact with someone infected with SARS-CoV-2, the virus that causes COVID-19, in a way that increases the likelihood of getting infected with the virus. °Close contact °A close contact is someone who was less than 6 feet away from an infected person (laboratory-confirmed or a clinical diagnosis) for a cumulative total of 15 minutes or more over a 24-hour period. For example, three individual 5-minute exposures for a total of 15 minutes. People who are exposed to someone with COVID-19 after they completed at least 5 days of isolation are not considered close contacts. °Quarantine °Quarantine is a strategy used to prevent transmission of COVID-19 by keeping people who have been in close contact with someone with COVID-19 apart from others. °Who does not need to quarantine? °If you had close contact with  someone with COVID-19 and you are in one of the following groups, you do not need to quarantine. °You are up to date with your COVID-19 vaccines. °You had confirmed COVID-19 within the last 90 days (meaning you tested positive using a viral test). °If you are up to date with COVID-19 vaccines, you should wear a well-fitting mask around others for 10 days from the date of your last close contact with someone with COVID-19 (the date of last close contact is considered day 0). Get tested at least 5 days after you last had close contact with someone with COVID-19. If you test positive or develop COVID-19 symptoms, isolate from other people and follow recommendations in the Isolation section below. If you tested positive for COVID-19 with a viral test within the previous 90 days and subsequently recovered and remain without COVID-19 symptoms, you do not need to quarantine or get tested after close contact. You should wear a well-fitting mask around others for 10 days from the date of your last close contact with someone with COVID-19 (the date of last   close contact is considered day 0). If you have COVID-19 symptoms, get tested and isolate from other people and follow recommendations in the Isolation section below. °Who should quarantine? °If you come into close contact with someone with COVID-19, you should quarantine if you are not up to date on COVID-19 vaccines. This includes people who are not vaccinated. °What to do for quarantine °Stay home and away from other people for at least 5 days (day 0 through day 5) after your last contact with a person who has COVID-19. The date of your exposure is considered day 0. Wear a well-fitting mask when around others at home, if possible. °For 10 days after your last close contact with someone with COVID-19, watch for fever (100.4°F or greater), cough, shortness of breath, or other COVID-19 symptoms. °If you develop symptoms, get tested immediately and isolate until you receive  your test results. If you test positive, follow isolation recommendations. °If you do not develop symptoms, get tested at least 5 days after you last had close contact with someone with COVID-19. °If you test negative, you can leave your home, but continue to wear a well-fitting mask when around others at home and in public until 10 days after your last close contact with someone with COVID-19. °If you test positive, you should isolate for at least 5 days from the date of your positive test (if you do not have symptoms). If you do develop COVID-19 symptoms, isolate for at least 5 days from the date your symptoms began (the date the symptoms started is day 0). Follow recommendations in the isolation section below. °If you are unable to get a test 5 days after last close contact with someone with COVID-19, you can leave your home after day 5 if you have been without COVID-19 symptoms throughout the 5-day period. Wear a well-fitting mask for 10 days after your date of last close contact when around others at home and in public. °Avoid people who are have weakened immune systems or are more likely to get very sick from COVID-19, and nursing homes and other high-risk settings, until after at least 10 days. °If possible, stay away from people you live with, especially people who are at higher risk for getting very sick from COVID-19, as well as others outside your home throughout the full 10 days after your last close contact with someone with COVID-19. °If you are unable to quarantine, you should wear a well-fitting mask for 10 days when around others at home and in public. °If you are unable to wear a mask when around others, you should continue to quarantine for 10 days. Avoid people who have weakened immune systems or are more likely to get very sick from COVID-19, and nursing homes and other high-risk settings, until after at least 10 days. °See additional information about travel. °Do not go to places where you are  unable to wear a mask, such as restaurants and some gyms, and avoid eating around others at home and at work until after 10 days after your last close contact with someone with COVID-19. °After quarantine °Watch for symptoms until 10 days after your last close contact with someone with COVID-19. °If you have symptoms, isolate immediately and get tested. °Quarantine in high-risk congregate settings °In certain congregate settings that have high risk of secondary transmission (such as correctional and detention facilities, homeless shelters, or cruise ships), CDC recommends a 10-day quarantine for residents, regardless of vaccination and booster status. During periods of critical staffing   shortages, facilities may consider shortening the quarantine period for staff to ensure continuity of operations. Decisions to shorten quarantine in these settings should be made in consultation with state, local, tribal, or territorial health departments and should take into consideration the context and characteristics of the facility. CDC's setting-specific guidance provides additional recommendations for these settings. °Isolation °Isolation is used to separate people with confirmed or suspected COVID-19 from those without COVID-19. People who are in isolation should stay home until it's safe for them to be around others. At home, anyone sick or infected should separate from others, or wear a well-fitting mask when they need to be around others. People in isolation should stay in a specific "sick room" or area and use a separate bathroom if available. Everyone who has presumed or confirmed COVID-19 should stay home and isolate from other people for at least 5 full days (day 0 is the first day of symptoms or the date of the day of the positive viral test for asymptomatic persons). They should wear a mask when around others at home and in public for an additional 5 days. People who are confirmed to have COVID-19 or are showing  symptoms of COVID-19 need to isolate regardless of their vaccination status. This includes: °People who have a positive viral test for COVID-19, regardless of whether or not they have symptoms. °People with symptoms of COVID-19, including people who are awaiting test results or have not been tested. People with symptoms should isolate even if they do not know if they have been in close contact with someone with COVID-19. °What to do for isolation °Monitor your symptoms. If you have an emergency warning sign (including trouble breathing), seek emergency medical care immediately. °Stay in a separate room from other household members, if possible. °Use a separate bathroom, if possible. °Take steps to improve ventilation at home, if possible. °Avoid contact with other members of the household and pets. °Don't share personal household items, like cups, towels, and utensils. °Wear a well-fitting mask when you need to be around other people. °Learn more about what to do if you are sick and how to notify your contacts. °Ending isolation for people who had COVID-19 and had symptoms °If you had COVID-19 and had symptoms, isolate for at least 5 days. To calculate your 5-day isolation period, day 0 is your first day of symptoms. Day 1 is the first full day after your symptoms developed. You can leave isolation after 5 full days. °You can end isolation after 5 full days if you are fever-free for 24 hours without the use of fever-reducing medication and your other symptoms have improved (Loss of taste and smell may persist for weeks or months after recovery and need not delay the end of isolation). °You should continue to wear a well-fitting mask around others at home and in public for 5 additional days (day 6 through day 10) after the end of your 5-day isolation period. If you are unable to wear a mask when around others, you should continue to isolate for a full 10 days. Avoid people who have weakened immune systems or are more  likely to get very sick from COVID-19, and nursing homes and other high-risk settings, until after at least 10 days. °If you continue to have fever or your other symptoms have not improved after 5 days of isolation, you should wait to end your isolation until you are fever-free for 24 hours without the use of fever-reducing medication and your other symptoms have improved.   Continue to wear a well-fitting mask through day 10. Contact your healthcare provider if you have questions. °See additional information about travel. °Do not go to places where you are unable to wear a mask, such as restaurants and some gyms, and avoid eating around others at home and at work until a full 10 days after your first day of symptoms. °If an individual has access to a test and wants to test, the best approach is to use an antigen test1 towards the end of the 5-day isolation period. Collect the test sample only if you are fever-free for 24 hours without the use of fever-reducing medication and your other symptoms have improved (loss of taste and smell may persist for weeks or months after recovery and need not delay the end of isolation). If your test result is positive, you should continue to isolate until day 10. If your test result is negative, you can end isolation, but continue to wear a well-fitting mask around others at home and in public until day 10. Follow additional recommendations for masking and avoiding travel as described above. °1As noted in the labeling for authorized over-the counter antigen tests: Negative results should be treated as presumptive. Negative results do not rule out SARS-CoV-2 infection and should not be used as the sole basis for treatment or patient management decisions, including infection control decisions. To improve results, antigen tests should be used twice over a three-day period with at least 24 hours and no more than 48 hours between tests. °Note that these recommendations on ending isolation  do not apply to people who are moderately ill or very sick from COVID-19 or have weakened immune systems. See section below for recommendations for when to end isolation for these groups. °Ending isolation for people who tested positive for COVID-19 but had no symptoms °If you test positive for COVID-19 and never develop symptoms, isolate for at least 5 days. Day 0 is the day of your positive viral test (based on the date you were tested) and day 1 is the first full day after the specimen was collected for your positive test. You can leave isolation after 5 full days. °If you continue to have no symptoms, you can end isolation after at least 5 days. °You should continue to wear a well-fitting mask around others at home and in public until day 10 (day 6 through day 10). If you are unable to wear a mask when around others, you should continue to isolate for 10 days. Avoid people who have weakened immune systems or are more likely to get very sick from COVID-19, and nursing homes and other high-risk settings, until after at least 10 days. °If you develop symptoms after testing positive, your 5-day isolation period should start over. Day 0 is your first day of symptoms. Follow the recommendations above for ending isolation for people who had COVID-19 and had symptoms. °See additional information about travel. °Do not go to places where you are unable to wear a mask, such as restaurants and some gyms, and avoid eating around others at home and at work until 10 days after the day of your positive test. °If an individual has access to a test and wants to test, the best approach is to use an antigen test1 towards the end of the 5-day isolation period. If your test result is positive, you should continue to isolate until day 10. If your test result is positive, you can also choose to test daily and if your test result   is negative, you can end isolation, but continue to wear a well-fitting mask around others at home and in  public until day 10. Follow additional recommendations for masking and avoiding travel as described above. °1As noted in the labeling for authorized over-the counter antigen tests: Negative results should be treated as presumptive. Negative results do not rule out SARS-CoV-2 infection and should not be used as the sole basis for treatment or patient management decisions, including infection control decisions. To improve results, antigen tests should be used twice over a three-day period with at least 24 hours and no more than 48 hours between tests. °Ending isolation for people who were moderately or very sick from COVID-19 or have a weakened immune system °People who are moderately ill from COVID-19 (experiencing symptoms that affect the lungs like shortness of breath or difficulty breathing) should isolate for 10 days and follow all other isolation precautions. To calculate your 10-day isolation period, day 0 is your first day of symptoms. Day 1 is the first full day after your symptoms developed. If you are unsure if your symptoms are moderate, talk to a healthcare provider for further guidance. °People who are very sick from COVID-19 (this means people who were hospitalized or required intensive care or ventilation support) and people who have weakened immune systems might need to isolate at home longer. They may also require testing with a viral test to determine when they can be around others. CDC recommends an isolation period of at least 10 and up to 20 days for people who were very sick from COVID-19 and for people with weakened immune systems. Consult with your healthcare provider about when you can resume being around other people. If you are unsure if your symptoms are severe or if you have a weakened immune system, talk to a healthcare provider for further guidance. °People who have a weakened immune system should talk to their healthcare provider about the potential for reduced immune responses to  COVID-19 vaccines and the need to continue to follow current prevention measures (including wearing a well-fitting mask and avoiding crowds and poorly ventilated indoor spaces) to protect themselves against COVID-19 until advised otherwise by their healthcare provider. Close contacts of immunocompromised people--including household members--should also be encouraged to receive all recommended COVID-19 vaccine doses to help protect these people. °Isolation in high-risk congregate settings °In certain high-risk congregate settings that have high risk of secondary transmission and where it is not feasible to cohort people (such as correctional and detention facilities, homeless shelters, and cruise ships), CDC recommends a 10-day isolation period for residents. During periods of critical staffing shortages, facilities may consider shortening the isolation period for staff to ensure continuity of operations. Decisions to shorten isolation in these settings should be made in consultation with state, local, tribal, or territorial health departments and should take into consideration the context and characteristics of the facility. CDC's setting-specific guidance provides additional recommendations for these settings. °This CDC guidance is meant to supplement--not replace--any federal, state, local, territorial, or tribal health and safety laws, rules, and regulations. °Recommendations for specific settings °These recommendations do not apply to healthcare professionals. For guidance specific to these settings, see °Healthcare professionals: Interim Guidance for Managing Healthcare Personnel with SARS-CoV-2 Infection or Exposure to SARS-CoV-2 °Patients, residents, and visitors to healthcare settings: Interim Infection Prevention and Control Recommendations for Healthcare Personnel During the Coronavirus Disease 2019 (COVID-19) Pandemic °Additional setting-specific guidance and recommendations are available. °These  recommendations on quarantine and isolation do apply to K[employer]12 School   settings. Additional guidance is available here: Overview of COVID-19 Quarantine for K-12 Schools °Travelers: Travel information and recommendations °Congregate facilities and other settings: guidance pages for community, work, and school settings °Ongoing COVID-19 exposure FAQs °I live with someone with COVID-19, but I cannot be separated from them. How do we manage quarantine in this situation? °It is very important for people with COVID-19 to remain apart from other people, if possible, even if they are living together. If separation of the person with COVID-19 from others that they live with is not possible, the other people that they live with will have ongoing exposure, meaning they will be repeatedly exposed until that person is no longer able to spread the virus to other people. In this situation, there are precautions you can take to limit the spread of COVID-19: °The person with COVID-19 and everyone they live with should wear a well-fitting mask inside the home. °If possible, one person should care for the person with COVID-19 to limit the number of people who are in close contact with the infected person. °Take steps to protect yourself and others to reduce transmission in the home: °Quarantine if you are not up to date with your COVID-19 vaccines. °Isolate if you are sick or tested positive for COVID-19, even if you don't have symptoms. °Learn more about the public health recommendations for testing, mask use and quarantine of close contacts, like yourself, who have ongoing exposure. These recommendations differ depending on your vaccination status. °What should I do if I have ongoing exposure to COVID-19 from someone I live with? °Recommendations for this situation depend on your vaccination status: °If you are not up to date on COVID-19 vaccines and have ongoing exposure to COVID-19, you should: °Begin quarantine immediately and  continue to quarantine throughout the isolation period of the person with COVID-19. °Continue to quarantine for an additional 5 days starting the day after the end of isolation for the person with COVID-19. °Get tested at least 5 days after the end of isolation of the infected person that lives with them. °If you test negative, you can leave the home but should continue to wear a well-fitting mask when around others at home and in public until 10 days after the end of isolation for the person with COVID-19. °Isolate immediately if you develop symptoms of COVID-19 or test positive. °If you are up to date with COVID-19 vaccines and have ongoing exposure to COVID-19, you should: °Get tested at least 5 days after your first exposure. A person with COVID-19 is considered infectious starting 2 days before they develop symptoms, or 2 days before the date of their positive test if they do not have symptoms. °Get tested again at least 5 days after the end of isolation for the person with COVID-19. °Wear a well-fitting mask when you are around the person with COVID-19, and do this throughout their isolation period. °Wear a well-fitting mask around others for 10 days after the infected person's isolation period ends. °Isolate immediately if you develop symptoms of COVID-19 or test positive. °What should I do if multiple people I live with test positive for COVID-19 at different times? °Recommendations for this situation depend on your vaccination status: °If you are not up to date with your COVID-19 vaccines, you should: °Quarantine throughout the isolation period of any infected person that you live with. °Continue to quarantine until 5 days after the end of isolation date for the most recently infected person that lives with you. For example, if   the last day of isolation of the person most recently infected with COVID-19 was June 30, the new 5-day quarantine period starts on July 1. °Get tested at least 5 days after the end  of isolation for the most recently infected person that lives with you. °Wear a well-fitting mask when you are around any person with COVID-19 while that person is in isolation. °Wear a well-fitting mask when you are around other people until 10 days after your last close contact. °Isolate immediately if you develop symptoms of COVID-19 or test positive. °If you are up to date with your COVID-19 vaccines, you should: °Get tested at least 5 days after your first exposure. A person with COVID-19 is considered infectious starting 2 days before they developed symptoms, or 2 days before the date of their positive test if they do not have symptoms. °Get tested again at least 5 days after the end of isolation for the most recently infected person that lives with you. °Wear a well-fitting mask when you are around any person with COVID-19 while that person is in isolation. °Wear a well-fitting mask around others for 10 days after the end of isolation for the most recently infected person that lives with you. For example, if the last day of isolation for the person most recently infected with COVID-19 was June 30, the new 10-day period to wear a well-fitting mask indoors in public starts on July 1. °Isolate immediately if you develop symptoms of COVID-19 or test positive. °I had COVID-19 and completed isolation. Do I have to quarantine or get tested if someone I live with gets COVID-19 shortly after I completed isolation? °No. If you recently completed isolation and someone that lives with you tests positive for the virus that causes COVID-19 shortly after the end of your isolation period, you do not have to quarantine or get tested as long as you do not develop new symptoms. Once all of the people that live together have completed isolation or quarantine, refer to the guidance below for new exposures to COVID-19. °If you had COVID-19 in the previous 90 days and then came into close contact with someone with COVID-19, you do  not have to quarantine or get tested if you do not have symptoms. But you should: °Wear a well-fitting mask indoors in public for 10 days after your last close contact. °Monitor for COVID-19 symptoms for 10 days from the date of your last close contact. °Isolate immediately and get tested if symptoms develop. °If more than 90 days have passed since your recovery from infection, follow CDC's recommendations for close contacts. These recommendations will differ depending on your vaccination status. °02/18/2021 °Content source: National Center for Immunization and Respiratory Diseases (NCIRD), Division of Viral Diseases °This information is not intended to replace advice given to you by your health care provider. Make sure you discuss any questions you have with your health care provider. °Document Revised: 06/24/2021 Document Reviewed: 06/24/2021 °Elsevier Patient Education © 2022 [employer] Inc. ° °

## 2021-12-30 LAB — SARS-COV-2, NAA 2 DAY TAT

## 2021-12-30 LAB — NOVEL CORONAVIRUS, NAA: SARS-CoV-2, NAA: DETECTED — AB

## 2022-01-05 ENCOUNTER — Ambulatory Visit (INDEPENDENT_AMBULATORY_CARE_PROVIDER_SITE_OTHER): Payer: 59 | Admitting: Physician Assistant

## 2022-01-05 ENCOUNTER — Other Ambulatory Visit: Payer: Self-pay

## 2022-01-05 ENCOUNTER — Encounter: Payer: Self-pay | Admitting: Physician Assistant

## 2022-01-05 DIAGNOSIS — D485 Neoplasm of uncertain behavior of skin: Secondary | ICD-10-CM

## 2022-01-05 NOTE — Patient Instructions (Signed)

## 2022-01-22 ENCOUNTER — Other Ambulatory Visit: Payer: Self-pay | Admitting: Nurse Practitioner

## 2022-01-22 DIAGNOSIS — K529 Noninfective gastroenteritis and colitis, unspecified: Secondary | ICD-10-CM

## 2022-01-22 NOTE — Telephone Encounter (Signed)
Last office visit 10/09/21 ?Last refill 09/29/21, #120, 1 refill ?

## 2022-01-24 ENCOUNTER — Encounter: Payer: Self-pay | Admitting: Physician Assistant

## 2022-01-24 NOTE — Progress Notes (Signed)
° °  Follow-Up Visit   Subjective  Casey [name] is a 61 y.o. female who presents for the following: Procedure (Here for cyst removal on mid forehead).   The following portions of the chart were reviewed this encounter and updated as appropriate:  Tobacco   Allergies   Meds   Problems   Med Hx   Surg Hx   Fam Hx       Objective  Well appearing patient in no apparent distress; mood and affect are within normal limits.  A full examination was performed including scalp, head, and face. All findings within normal limits unless otherwise noted below.  Mid Forehead Small nodule       Right Forehead Yellow  plaque with central dell        Assessment & Plan  Neoplasm of uncertain behavior of skin (2) Mid Forehead  Skin / nail biopsy Type of biopsy: tangential   Informed consent: discussed and consent obtained   Timeout: patient name, date of birth, surgical site, and procedure verified   Anesthesia: the lesion was anesthetized in a standard fashion   Anesthetic:  1% lidocaine w/ epinephrine 1-100,000 local infiltration Instrument used: flexible razor blade   Hemostasis achieved with: ferric subsulfate   Outcome: patient tolerated procedure well   Post-procedure details: wound care instructions given    Specimen 1 - Surgical pathology Differential Diagnosis: scc vs bcc  Check Margins: No  Right Forehead  Skin / nail biopsy Type of biopsy: tangential   Informed consent: discussed and consent obtained   Timeout: patient name, date of birth, surgical site, and procedure verified   Anesthesia: the lesion was anesthetized in a standard fashion   Anesthetic:  1% lidocaine w/ epinephrine 1-100,000 local infiltration Instrument used: flexible razor blade   Hemostasis achieved with: ferric subsulfate   Outcome: patient tolerated procedure well   Post-procedure details: wound care instructions given    Specimen 2 - Surgical pathology Differential Diagnosis: scc vs  bcc  Check Margins: No    I, Mckinsey Keagle, PA-C, have reviewed all documentation's for this visit.  The documentation on 01/24/22 for the exam, diagnosis, procedures and orders are all accurate and complete.

## 2022-02-21 ENCOUNTER — Telehealth: Payer: Self-pay | Admitting: Nurse Practitioner

## 2022-02-21 MED ORDER — POLYMYXIN B-TRIMETHOPRIM 10000-0.1 UNIT/ML-% OP SOLN: 2.0000 [drp] | OPHTHALMIC | 0 refills | Status: DC

## 2022-02-21 NOTE — Telephone Encounter (Signed)
Patient daughter and granddaughter have pink eye. She woke up this morning with eye red and matted together. ? ?Meds ordered this encounter  ?Medications  ? trimethoprim-polymyxin b (POLYTRIM) ophthalmic solution  ?  Sig: Place 2 drops into both eyes every 4 (four) hours.  ?  Dispense:  10 mL  ?  Refill:  0  ?  Order Specific Question:   Supervising Provider  ?  Answer:   Noemi Chapel [[id number]]  ? ?[name] Hassell Done, FNP ? ?

## 2022-02-22 ENCOUNTER — Other Ambulatory Visit: Payer: Self-pay | Admitting: Nurse Practitioner

## 2022-02-22 MED ORDER — OFLOXACIN 0.3 % OP SOLN: 1.0000 [drp] | Freq: Four times a day (QID) | OPHTHALMIC | 0 refills | Status: AC

## 2022-02-22 NOTE — Progress Notes (Signed)
Changed polytrim to ofloxacin ?

## 2022-03-23 ENCOUNTER — Telehealth: Payer: 59 | Admitting: Physician Assistant

## 2022-03-23 DIAGNOSIS — J019 Acute sinusitis, unspecified: Secondary | ICD-10-CM

## 2022-03-23 DIAGNOSIS — B9689 Other specified bacterial agents as the cause of diseases classified elsewhere: Secondary | ICD-10-CM

## 2022-03-23 MED ORDER — SULFAMETHOXAZOLE-TRIMETHOPRIM 800-160 MG PO TABS: 1.0000 | ORAL_TABLET | Freq: Two times a day (BID) | ORAL | 0 refills | Status: DC

## 2022-03-23 NOTE — Progress Notes (Signed)
I have spent 5 minutes in review of e-visit questionnaire, review and updating patient chart, medical decision making and response to patient.   Stephnie Parlier Cody Jaquanna Ballentine, PA-C    

## 2022-03-23 NOTE — Progress Notes (Signed)
E-Visit for Sinus Problems ? ?We are sorry that you are not feeling well.  Here is how we plan to help! ? ?Based on what you have shared with me it looks like you have sinusitis.  Sinusitis is inflammation and infection in the sinus cavities of the head.  Based on your presentation I believe you most likely have Acute Bacterial Sinusitis.  This is an infection caused by bacteria and is treated with antibiotics. I have prescribed Bactrim twice daily for 10 days. You may use an oral decongestant such as Mucinex D or if you have glaucoma or high blood pressure use plain Mucinex. Saline nasal spray help and can safely be used as often as needed for congestion.  If you develop worsening sinus pain, fever or notice severe headache and vision changes, or if symptoms are not better after completion of antibiotic, please schedule an appointment with a health care provider.   ? ?Sinus infections are not as easily transmitted as other respiratory infection, however we still recommend that you avoid close contact with loved ones, especially the very young and elderly.  Remember to wash your hands thoroughly throughout the day as this is the number one way to prevent the spread of infection! ? ?Home Care: ?Only take medications as instructed by your medical team. ?Complete the entire course of an antibiotic. ?Do not take these medications with alcohol. ?A steam or ultrasonic humidifier can help congestion.  You can place a towel over your head and breathe in the steam from hot water coming from a faucet. ?Avoid close contacts especially the very young and the elderly. ?Cover your mouth when you cough or sneeze. ?Always remember to wash your hands. ? ?Get Help Right Away If: ?You develop worsening fever or sinus pain. ?You develop a severe head ache or visual changes. ?Your symptoms persist after you have completed your treatment plan. ? ?Make sure you ?Understand these instructions. ?Will watch your condition. ?Will get help  right away if you are not doing well or get worse. ? ?Thank you for choosing an e-visit. ? ?Your e-visit answers were reviewed by a board certified advanced clinical practitioner to complete your personal care plan. Depending upon the condition, your plan could have included both over the counter or prescription medications. ? ?Please review your pharmacy choice. Make sure the pharmacy is open so you can pick up prescription now. If there is a problem, you may contact your provider through [employer] and have the prescription routed to another pharmacy.  Your safety is important to [place]. If you have drug allergies check your prescription carefully.  ? ?For the next 24 hours you can use MyChart to ask questions about today's visit, request a non-urgent call back, or ask for a work or school excuse. ?You will get an email in the next two days asking about your experience. I hope that your e-visit has been valuable and will speed your recovery. ? ?

## 2022-03-27 ENCOUNTER — Other Ambulatory Visit: Payer: Self-pay | Admitting: Nurse Practitioner

## 2022-03-27 DIAGNOSIS — I10 Essential (primary) hypertension: Secondary | ICD-10-CM

## 2022-04-08 ENCOUNTER — Encounter: Payer: Self-pay | Admitting: Nurse Practitioner

## 2022-04-08 ENCOUNTER — Ambulatory Visit: Payer: 59 | Admitting: Nurse Practitioner

## 2022-04-08 VITALS — BP 110/69 | HR 61 | Temp 97.9°F | Resp 20 | Ht 69.0 in | Wt 205.0 lb

## 2022-04-08 DIAGNOSIS — F3342 Major depressive disorder, recurrent, in full remission: Secondary | ICD-10-CM

## 2022-04-08 DIAGNOSIS — K219 Gastro-esophageal reflux disease without esophagitis: Secondary | ICD-10-CM | POA: Diagnosis not present

## 2022-04-08 DIAGNOSIS — Z23 Encounter for immunization: Secondary | ICD-10-CM

## 2022-04-08 DIAGNOSIS — I10 Essential (primary) hypertension: Secondary | ICD-10-CM

## 2022-04-08 DIAGNOSIS — E7800 Pure hypercholesterolemia, unspecified: Secondary | ICD-10-CM | POA: Diagnosis not present

## 2022-04-08 DIAGNOSIS — F5101 Primary insomnia: Secondary | ICD-10-CM

## 2022-04-08 DIAGNOSIS — K529 Noninfective gastroenteritis and colitis, unspecified: Secondary | ICD-10-CM

## 2022-04-08 DIAGNOSIS — D373 Neoplasm of uncertain behavior of appendix: Secondary | ICD-10-CM | POA: Diagnosis not present

## 2022-04-08 DIAGNOSIS — E039 Hypothyroidism, unspecified: Secondary | ICD-10-CM

## 2022-04-08 MED ORDER — VALSARTAN-HYDROCHLOROTHIAZIDE 160-12.5 MG PO TABS: ORAL_TABLET | ORAL | 1 refills | Status: DC

## 2022-04-08 MED ORDER — PANTOPRAZOLE SODIUM 40 MG PO TBEC: 40.0000 mg | DELAYED_RELEASE_TABLET | Freq: Every day | ORAL | 1 refills | Status: DC | PRN

## 2022-04-08 MED ORDER — ZOLPIDEM TARTRATE 10 MG PO TABS: 10.0000 mg | ORAL_TABLET | Freq: Every evening | ORAL | 5 refills | Status: DC | PRN

## 2022-04-08 MED ORDER — METOPROLOL SUCCINATE ER 50 MG PO TB24: ORAL_TABLET | ORAL | 1 refills | Status: DC

## 2022-04-08 MED ORDER — LEVOTHYROXINE SODIUM 150 MCG PO TABS: 150.0000 ug | ORAL_TABLET | Freq: Every day | ORAL | 1 refills | Status: DC

## 2022-04-08 MED ORDER — FENOFIBRATE 160 MG PO TABS: ORAL_TABLET | ORAL | 1 refills | Status: DC

## 2022-04-08 MED ORDER — ESCITALOPRAM OXALATE 10 MG PO TABS: 10.0000 mg | ORAL_TABLET | Freq: Every day | ORAL | 1 refills | Status: DC

## 2022-04-08 MED ORDER — DIPHENOXYLATE-ATROPINE 2.5-0.025 MG PO TABS: ORAL_TABLET | ORAL | 1 refills | Status: DC

## 2022-04-08 MED ORDER — FLUTICASONE PROPIONATE 50 MCG/ACT NA SUSP: 2.0000 | Freq: Every day | NASAL | 6 refills | Status: DC

## 2022-04-08 NOTE — Addendum Note (Signed)
Addended by: Rolena Infante on: 04/08/2022 04:54 PM   Modules accepted: Orders

## 2022-04-08 NOTE — Progress Notes (Signed)
Subjective:    Patient ID: Casey [name], female    DOB: 13-Jan-1961, 61 y.o.   MRN: [id number]  Chief Complaint: medical management of chronic issues     HPI:  Casey [name] is a 61 y.o. who identifies as a female who was assigned female at birth.   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 Primary hypertension No c/o chest pain, sob or headache. Doe snot check bloodpressure at home. BP Readings from Last 3 Encounters:  10/09/21 125/76  04/08/21 119/69  09/18/20 113/78     2. Pure hypercholesterolemia Does try to watch diet. Stays very active but no dedicated exercise. Lab Results  Component Value Date   CHOL 132 10/09/2021   HDL 55 10/09/2021   LDLCALC 62 10/09/2021   TRIG 74 10/09/2021   CHOLHDL 2.4 10/09/2021   The 10-year ASCVD risk score (Arnett DK, et al., 2019) is: 2.4%   3. Gastroesophageal reflux disease without esophagitis Is on protonix dialy. Will have symptoms if does not take meds.  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 No reoccurrence as of last check. She still has some issues form her cancer treatments, butt all in all doing well.  5. 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 Is some better. She takes imodium daily to help. She wll take a lomotil when gets real bad.  6. Acquired hypothyroidism No problems that she is awrae of. Lab Results  Component Value Date   TSH 1.360 10/09/2021     7. Primary insomnia Is on ambien to sleep at night. Sleep about 7-8 hours a night  8. Recurrent major depressive disorder, in full remission [facility]) She has been on lexapro for several years. Says it helps take the edge off of things.    04/08/2022    4:11 PM 10/09/2021    3:53 PM 04/08/2021    2:36 PM  Depression screen PHQ 2/9  Decreased Interest 0 0 1  Down, Depressed, Hopeless  0 0 3  PHQ - 2 Score 0 0 4  Altered sleeping 0 0 0  Tired, decreased energy 0 0 3  Change in appetite 0 0 0  Feeling bad or failure about yourself  0 0 0  Trouble concentrating 0 1 0  Moving slowly or fidgety/restless 0 0 2  Suicidal thoughts 0 0 0  PHQ-9 Score 0 1 9  Difficult doing work/chores Not difficult at all Somewhat difficult Somewhat difficult     9. Morbid obesity ([place]) Weight is up 5 lbs. Wt Readings from Last 3 Encounters:  04/08/22 205 lb (93 kg)  10/09/21 200 lb (90.7 kg)  04/08/21 206 lb (93.4 kg)   BMI Readings from Last 3 Encounters:  04/08/22 30.27 kg/m  10/09/21 29.53 kg/m  04/08/21 30.42 kg/m     New complaints: None today  Allergies  Allergen Reactions   Adhesive  [Tape] Itching    BANDAIDS   Benzalkonium    Buprenorphine Hcl Itching   Labetalol Itching   Morphine And Related Itching   Other     [place] seasoning    Wound Dressings    Zosyn [Piperacillin Sod-Tazobactam So] Rash    Pt states has had dose since and did not have difficulty, rash only on first dose , not in doses following    Outpatient  Encounter Medications as of 04/08/2022  Medication Sig   cetirizine (ZYRTEC) 10 MG tablet TAKE ONE (1) TABLET EACH DAY   cyanocobalamin (,VITAMIN B-12,) 1000 MCG/ML injection 11m every 14 days   diphenoxylate-atropine (LOMOTIL) 2.5-0.025 MG tablet TAKE TWO TABLETS BY MOUTH FOUR TIMES A DAY   escitalopram (LEXAPRO) 10 MG tablet Take 1 tablet (10 mg total) by mouth daily.   fenofibrate 160 MG tablet TAKE ONE (1) TABLET EACH DAY   folic acid (FOLVITE) 1 MG tablet TAKE ONE (1) TABLET EACH DAY   levothyroxine (SYNTHROID) 150 MCG tablet Take 1 tablet (150 mcg total) by mouth daily.   metoprolol succinate (TOPROL-XL) 50 MG 24 hr tablet TAKE ONE (1) TABLET EACH DAY   pantoprazole (PROTONIX) 40 MG tablet Take 1 tablet (40 mg total) by mouth daily as needed. (Needs to be seen before next refill)   promethazine (PHENERGAN) 25 MG tablet TAKE 1  TABLET EVERY 6 HOURS AS NEEDED FOR NAUSEA AND VOMITING   RESTASIS 0.05 % ophthalmic emulsion 2 drops in the morning and at bedtime.   sulfamethoxazole-trimethoprim (BACTRIM DS) 800-160 MG tablet Take 1 tablet by mouth 2 (two) times daily.   trimethoprim-polymyxin b (POLYTRIM) ophthalmic solution Place 2 drops into both eyes every 4 (four) hours.   valsartan-hydrochlorothiazide (DIOVAN-HCT) 160-12.5 MG tablet TAKE ONE (1) TABLET EACH DAY (Needs to be seen before next refill)   zolpidem (AMBIEN) 10 MG tablet Take 1 tablet (10 mg total) by mouth at bedtime as needed. for sleep   No facility-administered encounter medications on file as of 04/08/2022.    Past Surgical History:  Procedure Laterality Date   APPENDECTOMY     CRYOTHERAPY     for abnormal pap   DIAGNOSTIC LAPAROSCOPY     DILATION AND CURETTAGE OF UTERUS     infertility testing      LAPAROSCOPIC PARTIAL COLECTOMY N/A 02/02/2016   Procedure: LAPAROSCOPIC PARTIAL COlectomy right colon;  Surgeon: EGreer Pickerel MD;  Location: [facility];  Service: General;  Laterality: N/A;   OOPHORECTOMY     & fallopian tubes   TONSILLECTOMY     VAGINAL HYSTERECTOMY     total   VESICOVAGINAL FISTULA CLOSURE W/ TAH     Dr. LMargaretha Glassing 2 degree DUB     Family History  Problem Relation Age of Onset   Hypertension Mother    Irregular heart beat Mother    Thyroid disease Maternal Grandmother    Cancer Maternal Grandmother        lung   Cancer Paternal Grandfather        lung   Breast cancer Neg Hx       Controlled substance contract: n/a     Review of Systems  Constitutional:  Negative for diaphoresis.  Eyes:  Negative for pain.  Respiratory:  Negative for shortness of breath.   Cardiovascular:  Negative for chest pain, palpitations and leg swelling.  Gastrointestinal:  Negative for abdominal pain.  Endocrine: Negative for polydipsia.  Skin:  Negative for rash.  Neurological:  Negative for dizziness, weakness and headaches.   Hematological:  Does not bruise/bleed easily.  All other systems reviewed and are negative.     Objective:   Physical Exam Vitals and nursing note reviewed.  Constitutional:      General: She is not in acute distress.    Appearance: Normal appearance. She is well-developed.  HENT:     Head: Normocephalic.     Right Ear: Tympanic membrane normal.  Left Ear: Tympanic membrane normal.     Nose: Nose normal.     Mouth/Throat:     Mouth: Mucous membranes are moist.  Eyes:     Pupils: Pupils are equal, round, and reactive to light.  Neck:     Vascular: No carotid bruit or JVD.  Cardiovascular:     Rate and Rhythm: Normal rate and regular rhythm.     Heart sounds: Normal heart sounds.  Pulmonary:     Effort: Pulmonary effort is normal. No respiratory distress.     Breath sounds: Normal breath sounds. No wheezing or rales.  Chest:     Chest wall: No tenderness.  Abdominal:     General: Bowel sounds are normal. There is no distension or abdominal bruit.     Palpations: Abdomen is soft. There is no hepatomegaly, splenomegaly, mass or pulsatile mass.     Tenderness: There is no abdominal tenderness.  Musculoskeletal:        General: Normal range of motion.     Cervical back: Normal range of motion and neck supple.  Lymphadenopathy:     Cervical: No cervical adenopathy.  Skin:    General: Skin is warm and dry.  Neurological:     Mental Status: She is alert and oriented to person, place, and time.     Deep Tendon Reflexes: Reflexes are normal and symmetric.  Psychiatric:        Behavior: Behavior normal.        Thought Content: Thought content normal.        Judgment: Judgment normal.    BP 110/69   Pulse 61   Temp 97.9 F (36.6 C) (Temporal)   Resp 20   Ht '5\' 9"'$  (1.753 m)   Wt 205 lb (93 kg)   LMP 05/23/1995   SpO2 100%   BMI 30.27 kg/m        Assessment & Plan:   Casey [name] comes in today with chief complaint of Medical Management of Chronic Issues  (Sore throat/)   Diagnosis and orders addressed:  1. Primary hypertension Low sodium diet - metoprolol succinate (TOPROL-XL) 50 MG 24 hr tablet; TAKE ONE (1) TABLET EACH DAY  Dispense: 90 tablet; Refill: 1 - valsartan-hydrochlorothiazide (DIOVAN-HCT) 160-12.5 MG tablet; TAKE ONE (1) TABLET EACH DAY (Needs to be seen before next refill)  Dispense: 90 tablet; Refill: 1  2. Pure hypercholesterolemia Low fatdie - fenofibrate 160 MG tablet; TAKE ONE (1) TABLET EACH DAY  Dispense: 90 tablet; Refill: 1  3. Gastroesophageal reflux disease without esophagitis Avoid spicy foods Do not eat 2 hours prior to bedtime - pantoprazole (PROTONIX) 40 MG tablet; Take 1 tablet (40 mg total) by mouth daily as needed. 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 Chronic diarrhea - diphenoxylate-atropine (LOMOTIL) 2.5-0.025 MG tablet; TAKE TWO TABLETS BY MOUTH FOUR TIMES A DAY  Dispense: 120 tablet; Refill: 1  6. Acquired hypothyroidism Labs ipending - levothyroxine (SYNTHROID) 150 MCG tablet; Take 1 tablet (150 mcg total) by mouth daily.  Dispense: 90 tablet; Refill: 1  7. Primary insomnia Bedtime routine - zolpidem (AMBIEN) 10 MG tablet; Take 1 tablet (10 mg total) by mouth at bedtime as needed. for sleep  Dispense: 30 tablet; Refill: 5  8. Recurrent major depressive disorder, in full remission ([place]) Stress management - escitalopram (LEXAPRO) 10 MG tablet; Take 1 tablet (10 mg total) by  mouth daily.  Dispense: 90 tablet; Refill: 1  9. Morbid obesity ([place]) Discussed diet and exercise for person with BMI >25 Will recheck weight in 3-6 months    Labs pending Health Maintenance reviewed Diet and exercise encouraged  Follow up plan: 6 months   [name] Hassell Done, FNP

## 2022-04-08 NOTE — Patient Instructions (Signed)
Insomnia Insomnia is a sleep disorder that makes it difficult to fall asleep or stay asleep. Insomnia can cause fatigue, low energy, difficulty concentrating, mood swings, and poor performance at work or school. There are three different ways to classify insomnia: Difficulty falling asleep. Difficulty staying asleep. Waking up too early in the morning. Any type of insomnia can be long-term (chronic) or short-term (acute). Both are common. Short-term insomnia usually lasts for 3 months or less. Chronic insomnia occurs at least three times a week for longer than 3 months. What are the causes? Insomnia may be caused by another condition, situation, or substance, such as: Having certain mental health conditions, such as anxiety and depression. Using caffeine, alcohol, tobacco, or drugs. Having gastrointestinal conditions, such as gastroesophageal reflux disease (GERD). Having certain medical conditions. These include: Asthma. Alzheimer's disease. Stroke. Chronic pain. An overactive thyroid gland (hyperthyroidism). Other sleep disorders, such as restless legs syndrome and sleep apnea. Menopause. Sometimes, the cause of insomnia may not be known. What increases the risk? Risk factors for insomnia include: Gender. Females are affected more often than males. Age. Insomnia is more common as people get older. Stress and certain medical and mental health conditions. Lack of exercise. Having an irregular work schedule. This may include working night shifts and traveling between different time zones. What are the signs or symptoms? If you have insomnia, the main symptom is having trouble falling asleep or having trouble staying asleep. This may lead to other symptoms, such as: Feeling tired or having low energy. Feeling nervous about going to sleep. Not feeling rested in the morning. Having trouble concentrating. Feeling irritable, anxious, or depressed. How is this diagnosed? This condition  may be diagnosed based on: Your symptoms and medical history. Your health care provider may ask about: Your sleep habits. Any medical conditions you have. Your mental health. A physical exam. How is this treated? Treatment for insomnia depends on the cause. Treatment may focus on treating an underlying condition that is causing the insomnia. Treatment may also include: Medicines to help you sleep. Counseling or therapy. Lifestyle adjustments to help you sleep better. Follow these instructions at home: Eating and drinking  Limit or avoid alcohol, caffeinated beverages, and products that contain nicotine and tobacco, especially close to bedtime. These can disrupt your sleep. Do not eat a large meal or eat spicy foods right before bedtime. This can lead to digestive discomfort that can make it hard for you to sleep. Sleep habits  Keep a sleep diary to help you and your health care provider figure out what could be causing your insomnia. Write down: When you sleep. When you wake up during the night. How well you sleep and how rested you feel the next day. Any side effects of medicines you are taking. What you eat and drink. Make your bedroom a dark, comfortable place where it is easy to fall asleep. Put up shades or blackout curtains to block light from outside. Use a white noise machine to block noise. Keep the temperature cool. Limit screen use before bedtime. This includes: Not watching TV. Not using your smartphone, tablet, or computer. Stick to a routine that includes going to bed and waking up at the same times every day and night. This can help you fall asleep faster. Consider making a quiet activity, such as reading, part of your nighttime routine. Try to avoid taking naps during the day so that you sleep better at night. Get out of bed if you are still awake after   15 minutes of trying to sleep. Keep the lights down, but try reading or doing a quiet activity. When you feel  sleepy, go back to bed. General instructions Take over-the-counter and prescription medicines only as told by your health care provider. Exercise regularly as told by your health care provider. However, avoid exercising in the hours right before bedtime. Use relaxation techniques to manage stress. Ask your health care provider to suggest some techniques that may work well for you. These may include: Breathing exercises. Routines to release muscle tension. Visualizing peaceful scenes. Make sure that you drive carefully. Do not drive if you feel very sleepy. Keep all follow-up visits. This is important. Contact a health care provider if: You are tired throughout the day. You have trouble in your daily routine due to sleepiness. You continue to have sleep problems, or your sleep problems get worse. Get help right away if: You have thoughts about hurting yourself or someone else. Get help right away if you feel like you may hurt yourself or others, or have thoughts about taking your own life. Go to your nearest emergency room or: Call 911. Call the National Suicide Prevention Lifeline at 1-800-273-8255 or 988. This is open 24 hours a day. Text the Crisis Text Line at 741741. Summary Insomnia is a sleep disorder that makes it difficult to fall asleep or stay asleep. Insomnia can be long-term (chronic) or short-term (acute). Treatment for insomnia depends on the cause. Treatment may focus on treating an underlying condition that is causing the insomnia. Keep a sleep diary to help you and your health care provider figure out what could be causing your insomnia. This information is not intended to replace advice given to you by your health care provider. Make sure you discuss any questions you have with your health care provider. Document Revised: 10/19/2021 Document Reviewed: 10/19/2021 Elsevier Patient Education  2023 Elsevier Inc.  

## 2022-04-09 LAB — CBC WITH DIFFERENTIAL/PLATELET
Basophils Absolute: 0.1 10*3/uL (ref 0.0–0.2)
Basos: 1 %
EOS (ABSOLUTE): 0.2 10*3/uL (ref 0.0–0.4)
Eos: 3 %
Hematocrit: 36.6 % (ref 34.0–46.6)
Hemoglobin: 12.1 g/dL (ref 11.1–15.9)
Immature Grans (Abs): 0 10*3/uL (ref 0.0–0.1)
Immature Granulocytes: 0 %
Lymphocytes Absolute: 3.4 10*3/uL — ABNORMAL HIGH (ref 0.7–3.1)
Lymphs: 36 %
MCH: 29 pg (ref 26.6–33.0)
MCHC: 33.1 g/dL (ref 31.5–35.7)
MCV: 88 fL (ref 79–97)
Monocytes Absolute: 0.8 10*3/uL (ref 0.1–0.9)
Monocytes: 9 %
Neutrophils Absolute: 5.1 10*3/uL (ref 1.4–7.0)
Neutrophils: 51 %
Platelets: 271 10*3/uL (ref 150–450)
RBC: 4.17 x10E6/uL (ref 3.77–5.28)
RDW: 12.1 % (ref 11.7–15.4)
WBC: 9.7 10*3/uL (ref 3.4–10.8)

## 2022-04-09 LAB — LIPID PANEL
Chol/HDL Ratio: 2.4 ratio (ref 0.0–4.4)
Cholesterol, Total: 149 mg/dL (ref 100–199)
HDL: 61 mg/dL (ref >39)
LDL Chol Calc (NIH): 70 mg/dL (ref 0–99)
Triglycerides: 100 mg/dL (ref 0–149)
VLDL Cholesterol Cal: 18 mg/dL (ref 5–40)

## 2022-04-09 LAB — THYROID PANEL WITH TSH
Free Thyroxine Index: 2.7 (ref 1.2–4.9)
T3 Uptake Ratio: 31 % (ref 24–39)
T4, Total: 8.8 ug/dL (ref 4.5–12.0)
TSH: 7.47 u[IU]/mL — ABNORMAL HIGH (ref 0.450–4.500)

## 2022-04-09 LAB — CMP14+EGFR
ALT: 13 IU/L (ref 0–32)
AST: 19 IU/L (ref 0–40)
Albumin/Globulin Ratio: 1.7 (ref 1.2–2.2)
Albumin: 4.4 g/dL (ref 3.8–4.9)
Alkaline Phosphatase: 45 IU/L (ref 44–121)
BUN/Creatinine Ratio: 21 (ref 12–28)
BUN: 26 mg/dL (ref 8–27)
Bilirubin Total: <0.2 mg/dL (ref 0.0–1.2)
CO2: 25 mmol/L (ref 20–29)
Calcium: 9.5 mg/dL (ref 8.7–10.3)
Chloride: 99 mmol/L (ref 96–106)
Creatinine, Ser: 1.26 mg/dL — ABNORMAL HIGH (ref 0.57–1.00)
Globulin, Total: 2.6 g/dL (ref 1.5–4.5)
Glucose: 85 mg/dL (ref 70–99)
Potassium: 3.7 mmol/L (ref 3.5–5.2)
Sodium: 143 mmol/L (ref 134–144)
Total Protein: 7 g/dL (ref 6.0–8.5)
eGFR: 49 mL/min/{1.73_m2} — ABNORMAL LOW (ref >59)

## 2022-04-12 ENCOUNTER — Other Ambulatory Visit: Payer: Self-pay | Admitting: *Deleted

## 2022-04-12 DIAGNOSIS — E039 Hypothyroidism, unspecified: Secondary | ICD-10-CM

## 2022-04-12 MED ORDER — LEVOTHYROXINE SODIUM 175 MCG PO TABS: 175.0000 ug | ORAL_TABLET | Freq: Every day | ORAL | 3 refills | Status: DC

## 2022-04-12 NOTE — Addendum Note (Signed)
Addended by: Chevis Pretty on: 04/12/2022 11:28 AM   Modules accepted: Orders

## 2022-04-29 ENCOUNTER — Other Ambulatory Visit (HOSPITAL_COMMUNITY): Payer: Self-pay | Admitting: Hematology and Oncology

## 2022-06-16 ENCOUNTER — Other Ambulatory Visit: Payer: Self-pay | Admitting: Nurse Practitioner

## 2022-06-16 DIAGNOSIS — I10 Essential (primary) hypertension: Secondary | ICD-10-CM

## 2022-07-23 ENCOUNTER — Other Ambulatory Visit: Payer: Self-pay | Admitting: Family

## 2022-07-23 DIAGNOSIS — K529 Noninfective gastroenteritis and colitis, unspecified: Secondary | ICD-10-CM

## 2022-08-16 ENCOUNTER — Other Ambulatory Visit (HOSPITAL_COMMUNITY): Payer: Self-pay | Admitting: Hematology and Oncology

## 2022-10-03 ENCOUNTER — Other Ambulatory Visit: Payer: Self-pay | Admitting: Nurse Practitioner

## 2022-10-03 DIAGNOSIS — F5101 Primary insomnia: Secondary | ICD-10-CM

## 2022-10-11 ENCOUNTER — Ambulatory Visit: Payer: 59 | Admitting: Nurse Practitioner

## 2022-10-11 ENCOUNTER — Encounter: Payer: Self-pay | Admitting: Nurse Practitioner

## 2022-10-11 VITALS — BP 117/63 | HR 67 | Temp 98.1°F | Resp 20 | Ht 69.0 in | Wt 204.0 lb

## 2022-10-11 DIAGNOSIS — K529 Noninfective gastroenteritis and colitis, unspecified: Secondary | ICD-10-CM

## 2022-10-11 DIAGNOSIS — E7800 Pure hypercholesterolemia, unspecified: Secondary | ICD-10-CM | POA: Diagnosis not present

## 2022-10-11 DIAGNOSIS — D373 Neoplasm of uncertain behavior of appendix: Secondary | ICD-10-CM | POA: Diagnosis not present

## 2022-10-11 DIAGNOSIS — F5101 Primary insomnia: Secondary | ICD-10-CM

## 2022-10-11 DIAGNOSIS — K219 Gastro-esophageal reflux disease without esophagitis: Secondary | ICD-10-CM

## 2022-10-11 DIAGNOSIS — E039 Hypothyroidism, unspecified: Secondary | ICD-10-CM

## 2022-10-11 DIAGNOSIS — Z23 Encounter for immunization: Secondary | ICD-10-CM

## 2022-10-11 DIAGNOSIS — F3342 Major depressive disorder, recurrent, in full remission: Secondary | ICD-10-CM

## 2022-10-11 DIAGNOSIS — I10 Essential (primary) hypertension: Secondary | ICD-10-CM | POA: Diagnosis not present

## 2022-10-11 MED ORDER — FENOFIBRATE 160 MG PO TABS: ORAL_TABLET | ORAL | 1 refills | Status: DC

## 2022-10-11 MED ORDER — FLUTICASONE PROPIONATE 50 MCG/ACT NA SUSP: 2.0000 | Freq: Every day | NASAL | 6 refills | Status: DC

## 2022-10-11 MED ORDER — PANTOPRAZOLE SODIUM 40 MG PO TBEC: 40.0000 mg | DELAYED_RELEASE_TABLET | Freq: Every day | ORAL | 1 refills | Status: DC | PRN

## 2022-10-11 MED ORDER — VALSARTAN-HYDROCHLOROTHIAZIDE 160-12.5 MG PO TABS: ORAL_TABLET | ORAL | 1 refills | Status: DC

## 2022-10-11 MED ORDER — ZOLPIDEM TARTRATE 10 MG PO TABS: 10.0000 mg | ORAL_TABLET | Freq: Every evening | ORAL | 5 refills | Status: DC | PRN

## 2022-10-11 MED ORDER — DIPHENOXYLATE-ATROPINE 2.5-0.025 MG PO TABS: ORAL_TABLET | ORAL | 2 refills | Status: DC

## 2022-10-11 MED ORDER — ESCITALOPRAM OXALATE 10 MG PO TABS: 10.0000 mg | ORAL_TABLET | Freq: Every day | ORAL | 1 refills | Status: DC

## 2022-10-11 MED ORDER — METOPROLOL SUCCINATE ER 50 MG PO TB24: ORAL_TABLET | ORAL | 0 refills | Status: DC

## 2022-10-11 NOTE — Patient Instructions (Signed)

## 2022-10-11 NOTE — Addendum Note (Signed)
Addended by: Chevis Pretty on: 10/11/2022 04:43 PM   Modules accepted: Level of Service

## 2022-10-11 NOTE — Progress Notes (Signed)
Subjective:    Patient ID: Casey [name], female    DOB: 03/09/1961, 61 y.o.   MRN: [id number]   Chief Complaint: medical management of chronic issues     HPI:  Casey [name] is a 61 y.o. who identifies as a female who was assigned female at birth.   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 Primary hypertension No c/o chest pain, sob or headache. Doe snot check blood pressure at home. BP Readings from Last 3 Encounters:  04/08/22 110/69  10/09/21 125/76  04/08/21 119/69     2. Pure hypercholesterolemia Does try to watch diet but does no dedicated exercise. 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 Has had no reoccurrence.  4. Chronic diarrhea Has had chronic diarrhea since she had her cancer treatement in 2017.  5. Gastroesophageal reflux disease without esophagitis Is on protionix daily  6. Acquired hypothyroidism No issues that she is aware of. We increased her synthroid to 165mg daily. Lab Results  Component Value Date   TSH 7.470 (H) 04/08/2022     7. 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  8. Primary insomnia Is on ambien to sleep at night  9. Morbid obesity ([place] No recent weight changes Wt Readings from Last 3 Encounters:  10/11/22 204 lb (92.5 kg)  04/08/22 205 lb (93 kg)  10/09/21 200 lb (90.7 kg)   BMI Readings from Last 3 Encounters:  10/11/22 30.13 kg/m  04/08/22 30.27 kg/m  10/09/21 29.53 kg/m     New complaints: None today  Allergies  Allergen Reactions   Adhesive  [Tape] Itching    BANDAIDS    Benzalkonium    Buprenorphine Hcl Itching   Labetalol Itching   Morphine And Related Itching   Other     [place] seasoning    Wound Dressings    Zosyn [Piperacillin Sod-Tazobactam So] Rash    Pt states has had dose since and did not have difficulty, rash only on first dose , not in doses following    Outpatient Encounter Medications as of 10/11/2022  Medication Sig   cetirizine (ZYRTEC) 10 MG tablet TAKE ONE (1) TABLET EACH DAY   cyanocobalamin (,VITAMIN B-12,) 1000 MCG/ML injection 177mevery 14 days   diphenoxylate-atropine (LOMOTIL) 2.5-0.025 MG tablet TAKE TWO TABLETS BY MOUTH FOUR TIMES A DAY   escitalopram (LEXAPRO) 10 MG tablet Take 1 tablet (10 mg total) by mouth daily.   fenofibrate 160 MG tablet TAKE ONE (1) TABLET EACH DAY   fluticasone (FLONASE) 50 MCG/ACT nasal spray Place 2 sprays into both nostrils daily.   folic acid (FOLVITE) 1 MG tablet TAKE ONE (1) TABLET EACH DAY   levothyroxine (SYNTHROID) 175 MCG tablet Take 1 tablet (175 mcg total) by mouth daily.   metoprolol succinate (TOPROL-XL) 50 MG 24 hr tablet TAKE ONE (1) TABLET EACH DAY   pantoprazole (PROTONIX) 40 MG tablet Take 1 tablet (40 mg total) by mouth daily as needed. (Needs to be seen before next refill)  promethazine (PHENERGAN) 25 MG tablet TAKE 1 TABLET EVERY 6 HOURS AS NEEDED FOR NAUSEA AND VOMITING   RESTASIS 0.05 % ophthalmic emulsion 2 drops in the morning and at bedtime.   valsartan-hydrochlorothiazide (DIOVAN-HCT) 160-12.5 MG tablet TAKE ONE (1) TABLET EACH DAY (Needs to be seen before next refill)   zolpidem (AMBIEN) 10 MG tablet Take 1 tablet (10 mg total) by mouth at bedtime as needed. for sleep   No facility-administered encounter medications on file as of 10/11/2022.    Past Surgical History:  Procedure Laterality Date   APPENDECTOMY     CRYOTHERAPY     for abnormal pap   DIAGNOSTIC LAPAROSCOPY     DILATION AND CURETTAGE OF UTERUS     infertility testing      LAPAROSCOPIC PARTIAL  COLECTOMY N/A 02/02/2016   Procedure: LAPAROSCOPIC PARTIAL COlectomy right colon;  Surgeon: Greer Pickerel, MD;  Location: [facility];  Service: General;  Laterality: N/A;   OOPHORECTOMY     & fallopian tubes   TONSILLECTOMY     VAGINAL HYSTERECTOMY     total   VESICOVAGINAL FISTULA CLOSURE W/ TAH     Dr. Margaretha Glassing  2 degree DUB     Family History  Problem Relation Age of Onset   Hypertension Mother    Irregular heart beat Mother    Thyroid disease Maternal Grandmother    Cancer Maternal Grandmother        lung   Cancer Paternal Grandfather        lung   Breast cancer Neg Hx       Controlled substance contract: n/a     Review of Systems  Constitutional:  Negative for diaphoresis.  Eyes:  Negative for pain.  Respiratory:  Negative for shortness of breath.   Cardiovascular:  Negative for chest pain, palpitations and leg swelling.  Gastrointestinal:  Negative for abdominal pain.  Endocrine: Negative for polydipsia.  Skin:  Negative for rash.  Neurological:  Negative for dizziness, weakness and headaches.  Hematological:  Does not bruise/bleed easily.  All other systems reviewed and are negative.      Objective:   Physical Exam Vitals reviewed.  Constitutional:      General: She is not in acute distress.    Appearance: Normal appearance. She is well-developed.  HENT:     Head: Normocephalic.     Right Ear: Tympanic membrane normal.     Left Ear: Tympanic membrane normal.     Nose: Nose normal.     Mouth/Throat:     Mouth: Mucous membranes are moist.  Eyes:     Pupils: Pupils are equal, round, and reactive to light.  Neck:     Vascular: No carotid bruit or JVD.  Cardiovascular:     Rate and Rhythm: Normal rate and regular rhythm.     Heart sounds: Normal heart sounds.  Pulmonary:     Effort: Pulmonary effort is normal. No respiratory distress.     Breath sounds: Normal breath sounds. No wheezing or rales.  Chest:     Chest wall: No tenderness.  Abdominal:      General: Bowel sounds are normal. There is no distension or abdominal bruit.     Palpations: Abdomen is soft. There is no hepatomegaly, splenomegaly, mass or pulsatile mass.     Tenderness: There is no abdominal tenderness.  Musculoskeletal:        General: Normal range of motion.     Cervical back: Normal range of motion and neck supple.  Lymphadenopathy:     Cervical: No cervical adenopathy.  Skin:    General: Skin is warm and dry.  Neurological:     Mental Status: She is alert and oriented to person, place, and time.     Deep Tendon Reflexes: Reflexes are normal and symmetric.  Psychiatric:        Behavior: Behavior normal.        Thought Content: Thought content normal.        Judgment: Judgment normal.    BP 117/63   Pulse 67   Temp 98.1 F (36.7 C) (Temporal)   Resp 20   Ht _0  (1.753 m)   Wt 204 lb (92.5 kg)   LMP 05/23/1995   SpO2 98%   BMI 30.13 kg/m         Assessment & Plan:  TONIQUA MELAMED comes in today with chief complaint of Medical Management of Chronic Issues   Diagnosis and orders addressed:  1. Primary hypertension Low sodium diet - CBC with Differential/Platelet - CMP14+EGFR - metoprolol succinate (TOPROL-XL) 50 MG 24 hr tablet; Take with or immediately following a meal.  Dispense: 90 tablet; Refill: 0 - valsartan-hydrochlorothiazide (DIOVAN-HCT) 160-12.5 MG tablet; TAKE ONE (1) TABLET EACH DAY (Needs to be seen before next refill)  Dispense: 90 tablet; Refill: 1  2. 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 Chronic diarrhea - diphenoxylate-atropine (LOMOTIL) 2.5-0.025 MG tablet; 1 po qid prn  Dispense: 120 tablet; Refill: 2  5. Gastroesophageal reflux disease without esophagitis Avoid spicy foods Do not eat 2 hours prior to bedtime - pantoprazole  (PROTONIX) 40 MG tablet; Take 1 tablet (40 mg total) by mouth daily as needed. (Needs to be seen before next refill)  Dispense: 90 tablet; Refill: 1  6. Acquired hypothyroidism Labs pending - Thyroid Panel With TSH  7. Recurrent major depressive disorder, in full remission ([place]) Stress management - escitalopram (LEXAPRO) 10 MG tablet; Take 1 tablet (10 mg total) by mouth daily.  Dispense: 90 tablet; Refill: 1  8. Primary insomnia Bedtime routine - zolpidem (AMBIEN) 10 MG tablet; Take 1 tablet (10 mg total) by mouth at bedtime as needed. for sleep  Dispense: 30 tablet; Refill: 5  9. Morbid obesity ([place]) Discussed diet and exercise for person with BMI >25 Will recheck weight in 3-6 months    Labs pending Health Maintenance reviewed Diet and exercise encouraged  Follow up plan: 6 months   [name] Hassell Done, FNP

## 2022-10-12 DIAGNOSIS — Z23 Encounter for immunization: Secondary | ICD-10-CM | POA: Diagnosis not present

## 2022-10-12 LAB — LIPID PANEL
Chol/HDL Ratio: 2.5 ratio (ref 0.0–4.4)
Cholesterol, Total: 147 mg/dL (ref 100–199)
HDL: 59 mg/dL (ref >39)
LDL Chol Calc (NIH): 72 mg/dL (ref 0–99)
Triglycerides: 84 mg/dL (ref 0–149)
VLDL Cholesterol Cal: 16 mg/dL (ref 5–40)

## 2022-10-12 LAB — CBC WITH DIFFERENTIAL/PLATELET
Basophils Absolute: 0.1 10*3/uL (ref 0.0–0.2)
Basos: 1 %
EOS (ABSOLUTE): 0.2 10*3/uL (ref 0.0–0.4)
Eos: 1 %
Hematocrit: 37 % (ref 34.0–46.6)
Hemoglobin: 12.3 g/dL (ref 11.1–15.9)
Immature Grans (Abs): 0 10*3/uL (ref 0.0–0.1)
Immature Granulocytes: 0 %
Lymphocytes Absolute: 2.3 10*3/uL (ref 0.7–3.1)
Lymphs: 16 %
MCH: 29.3 pg (ref 26.6–33.0)
MCHC: 33.2 g/dL (ref 31.5–35.7)
MCV: 88 fL (ref 79–97)
Monocytes Absolute: 1 10*3/uL — ABNORMAL HIGH (ref 0.1–0.9)
Monocytes: 7 %
Neutrophils Absolute: 10.9 10*3/uL — ABNORMAL HIGH (ref 1.4–7.0)
Neutrophils: 75 %
Platelets: 256 10*3/uL (ref 150–450)
RBC: 4.2 x10E6/uL (ref 3.77–5.28)
RDW: 12.5 % (ref 11.7–15.4)
WBC: 14.6 10*3/uL — ABNORMAL HIGH (ref 3.4–10.8)

## 2022-10-12 LAB — THYROID PANEL WITH TSH
Free Thyroxine Index: 3.4 (ref 1.2–4.9)
T3 Uptake Ratio: 31 % (ref 24–39)
T4, Total: 11 ug/dL (ref 4.5–12.0)
TSH: 0.209 u[IU]/mL — ABNORMAL LOW (ref 0.450–4.500)

## 2022-10-12 LAB — CMP14+EGFR
ALT: 14 IU/L (ref 0–32)
AST: 16 IU/L (ref 0–40)
Albumin/Globulin Ratio: 1.8 (ref 1.2–2.2)
Albumin: 4.4 g/dL (ref 3.9–4.9)
Alkaline Phosphatase: 50 IU/L (ref 44–121)
BUN/Creatinine Ratio: 25 (ref 12–28)
BUN: 26 mg/dL (ref 8–27)
Bilirubin Total: <0.2 mg/dL (ref 0.0–1.2)
CO2: 24 mmol/L (ref 20–29)
Calcium: 9.8 mg/dL (ref 8.7–10.3)
Chloride: 104 mmol/L (ref 96–106)
Creatinine, Ser: 1.06 mg/dL — ABNORMAL HIGH (ref 0.57–1.00)
Globulin, Total: 2.4 g/dL (ref 1.5–4.5)
Glucose: 115 mg/dL — ABNORMAL HIGH (ref 70–99)
Potassium: 3.4 mmol/L — ABNORMAL LOW (ref 3.5–5.2)
Sodium: 142 mmol/L (ref 134–144)
Total Protein: 6.8 g/dL (ref 6.0–8.5)
eGFR: 60 mL/min/{1.73_m2} (ref >59)

## 2022-10-12 MED ORDER — LEVOTHYROXINE SODIUM 150 MCG PO TABS: 150.0000 ug | ORAL_TABLET | Freq: Every day | ORAL | 1 refills | Status: DC

## 2022-10-12 NOTE — Addendum Note (Signed)
Addended by: Chevis Pretty on: 10/12/2022 12:27 PM   Modules accepted: Orders

## 2022-10-13 ENCOUNTER — Telehealth: Payer: 59 | Admitting: Nurse Practitioner

## 2022-10-13 DIAGNOSIS — J028 Acute pharyngitis due to other specified organisms: Secondary | ICD-10-CM | POA: Diagnosis not present

## 2022-10-13 DIAGNOSIS — B9689 Other specified bacterial agents as the cause of diseases classified elsewhere: Secondary | ICD-10-CM | POA: Diagnosis not present

## 2022-10-13 MED ORDER — AZITHROMYCIN 250 MG PO TABS: ORAL_TABLET | ORAL | 0 refills | Status: AC

## 2022-10-13 NOTE — Progress Notes (Signed)
Virtual Visit Consent   Casey [name], you are scheduled for a virtual visit with a [facility] provider today. Just as with appointments in the office, your consent must be obtained to participate. Your consent will be active for this visit and any virtual visit you may have with one of our providers in the next 365 days. If you have a MyChart account, a copy of this consent can be sent to you electronically.  As this is a virtual visit, video technology does not allow for your provider to perform a traditional examination. This may limit your provider's ability to fully assess your condition. If your provider identifies any concerns that need to be evaluated in person or the need to arrange testing (such as labs, EKG, etc.), we will make arrangements to do so. Although advances in technology are sophisticated, we cannot ensure that it will always work on either your end or our end. If the connection with a video visit is poor, the visit may have to be switched to a telephone visit. With either a video or telephone visit, we are not always able to ensure that we have a secure connection.  By engaging in this virtual visit, you consent to the provision of healthcare and authorize for your insurance to be billed (if applicable) for the services provided during this visit. Depending on your insurance coverage, you may receive a charge related to this service.  I need to obtain your verbal consent now. Are you willing to proceed with your visit today? ROZETTA STUMPP has provided verbal consent on 10/13/2022 for a virtual visit (video or telephone). [name], NP  Date: 10/13/2022 6:18 PM  Virtual Visit via Video Note   I, [name], connected with  DALEEN STEINHAUS  ([id number], [birth year]) on 10/13/22 at  6:15 PM EST by a video-enabled telemedicine application and verified that I am speaking with the correct person using two identifiers.  Location: Patient: Virtual Visit Location  Patient: Home Provider: Virtual Visit Location Provider: Home Office   I discussed the limitations of evaluation and management by telemedicine and the availability of in person appointments. The patient expressed understanding and agreed to proceed.    History of Present Illness: Casey [name] is a 61 y.o. who identifies as a female who was assigned female at birth, and is being seen today for Bacterial pharyngitis.  Sore Throat: Patient complains of sore throat. Associated symptoms include chills, enlarged tonsils, headache, hoarseness, nasal blockage, pain while swallowing, post nasal drip, sinus and nasal congestion, sore throat, swollen glands, and white spots in throat.Onset of symptoms was a few days ago, gradually worsening since that time. She is drinking plenty of fluids. She has not had recent close exposure to someone with proven streptococcal pharyngitis. She did receive the flu vaccine yesterday but states her throat was sore prior to receiving the vaccine.   Problems:  Patient Active Problem List   Diagnosis Date Noted   Primary insomnia 10/09/2021   Chronic diarrhea 10/09/2021   Morbid obesity ([place])    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   GERD (gastroesophageal reflux disease) 03/18/2014   Depression 03/18/2014   Hypertension 03/28/2013   Hyperlipidemia 03/28/2013   Hypothyroidism 03/28/2013   Graves' disease     Allergies:  Allergies  Allergen Reactions   Adhesive  [Tape] Itching    BANDAIDS   Benzalkonium    Buprenorphine Hcl Itching   Labetalol Itching   Morphine And Related Itching  Other     [place] seafood seasoning    Wound Dressings    Zosyn [Piperacillin Sod-Tazobactam So] Rash    Pt states has had dose since and did not have difficulty, rash only on first dose , not in doses following    Medications:  Current Outpatient Medications:    azithromycin (ZITHROMAX) 250 MG tablet, Take 2 tablets on day 1, then 1 tablet daily on days 2  through 5, Disp: 6 tablet, Rfl: 0   cetirizine (ZYRTEC) 10 MG tablet, TAKE ONE (1) TABLET EACH DAY, Disp: 90 tablet, Rfl: 3   cyanocobalamin (,VITAMIN B-12,) 1000 MCG/ML injection, 1m every 14 days, Disp: 30 mL, Rfl: 1   diphenoxylate-atropine (LOMOTIL) 2.5-0.025 MG tablet, 1 po qid prn, Disp: 120 tablet, Rfl: 2   escitalopram (LEXAPRO) 10 MG tablet, Take 1 tablet (10 mg total) by mouth daily., Disp: 90 tablet, Rfl: 1   fenofibrate 160 MG tablet, TAKE ONE (1) TABLET EACH DAY, Disp: 90 tablet, Rfl: 1   fluticasone (FLONASE) 50 MCG/ACT nasal spray, Place 2 sprays into both nostrils daily., Disp: 16 g, Rfl: 6   folic acid (FOLVITE) 1 MG tablet, TAKE ONE (1) TABLET EACH DAY, Disp: 90 tablet, Rfl: 3   levothyroxine (SYNTHROID) 150 MCG tablet, Take 1 tablet (150 mcg total) by mouth daily., Disp: 90 tablet, Rfl: 1   metoprolol succinate (TOPROL-XL) 50 MG 24 hr tablet, Take with or immediately following a meal., Disp: 90 tablet, Rfl: 0   pantoprazole (PROTONIX) 40 MG tablet, Take 1 tablet (40 mg total) by mouth daily as needed. (Needs to be seen before next refill), Disp: 90 tablet, Rfl: 1   promethazine (PHENERGAN) 25 MG tablet, TAKE 1 TABLET EVERY 6 HOURS AS NEEDED FOR NAUSEA AND VOMITING, Disp: 30 tablet, Rfl: 0   RESTASIS 0.05 % ophthalmic emulsion, 2 drops in the morning and at bedtime., Disp: , Rfl:    valsartan-hydrochlorothiazide (DIOVAN-HCT) 160-12.5 MG tablet, TAKE ONE (1) TABLET EACH DAY (Needs to be seen before next refill), Disp: 90 tablet, Rfl: 1   zolpidem (AMBIEN) 10 MG tablet, Take 1 tablet (10 mg total) by mouth at bedtime as needed. for sleep, Disp: 30 tablet, Rfl: 5  Observations/Objective: Patient is well-developed, well-nourished in no acute distress.  Resting comfortably at home.  Head is normocephalic, atraumatic.  No labored breathing.  Speech is clear and coherent with logical content. Voice is hoarse Patient is alert and oriented at baseline.    Assessment and Plan: 1.  Bacterial pharyngitis - azithromycin (ZITHROMAX) 250 MG tablet; Take 2 tablets on day 1, then 1 tablet daily on days 2 through 5  Dispense: 6 tablet; Refill: 0  May alternate with liquid Tylenol and Motrin for pain and fever relief every 6 6 to 8 hours Warm salt water gargles or tea with honey to help soothe the throat  Follow Up Instructions: I discussed the assessment and treatment plan with the patient. The patient was provided an opportunity to ask questions and all were answered. The patient agreed with the plan and demonstrated an understanding of the instructions.  A copy of instructions were sent to the patient via MyChart unless otherwise noted below.    The patient was advised to call back or seek an in-person evaluation if the symptoms worsen or if the condition fails to improve as anticipated.  Time:  I spent 11 minutes with the patient via telehealth technology discussing the above problems/concerns.    ZGildardo Pounds NP

## 2022-10-13 NOTE — Patient Instructions (Signed)
Glade [name], thank you for joining Casey Pounds, NP for today's virtual visit.  While this provider is not your primary care provider (PCP), if your PCP is located in our provider database this encounter information will be shared with them immediately following your visit.   [facility] account gives you access to today's visit and all your visits, tests, and labs performed at [facility] " click here if you don't have a [facility] account or go to [email or web address]  Consent: (Patient) Casey [name] provided verbal consent for this virtual visit at the beginning of the encounter.  Current Medications:  Current Outpatient Medications:    azithromycin (ZITHROMAX) 250 MG tablet, Take 2 tablets on day 1, then 1 tablet daily on days 2 through 5, Disp: 6 tablet, Rfl: 0   cetirizine (ZYRTEC) 10 MG tablet, TAKE ONE (1) TABLET EACH DAY, Disp: 90 tablet, Rfl: 3   cyanocobalamin (,VITAMIN B-12,) 1000 MCG/ML injection, 55m every 14 days, Disp: 30 mL, Rfl: 1   diphenoxylate-atropine (LOMOTIL) 2.5-0.025 MG tablet, 1 po qid prn, Disp: 120 tablet, Rfl: 2   escitalopram (LEXAPRO) 10 MG tablet, Take 1 tablet (10 mg total) by mouth daily., Disp: 90 tablet, Rfl: 1   fenofibrate 160 MG tablet, TAKE ONE (1) TABLET EACH DAY, Disp: 90 tablet, Rfl: 1   fluticasone (FLONASE) 50 MCG/ACT nasal spray, Place 2 sprays into both nostrils daily., Disp: 16 g, Rfl: 6   folic acid (FOLVITE) 1 MG tablet, TAKE ONE (1) TABLET EACH DAY, Disp: 90 tablet, Rfl: 3   levothyroxine (SYNTHROID) 150 MCG tablet, Take 1 tablet (150 mcg total) by mouth daily., Disp: 90 tablet, Rfl: 1   metoprolol succinate (TOPROL-XL) 50 MG 24 hr tablet, Take with or immediately following a meal., Disp: 90 tablet, Rfl: 0   pantoprazole (PROTONIX) 40 MG tablet, Take 1 tablet (40 mg total) by mouth daily as needed. (Needs to be seen before next refill), Disp: 90 tablet, Rfl: 1   promethazine (PHENERGAN) 25 MG  tablet, TAKE 1 TABLET EVERY 6 HOURS AS NEEDED FOR NAUSEA AND VOMITING, Disp: 30 tablet, Rfl: 0   RESTASIS 0.05 % ophthalmic emulsion, 2 drops in the morning and at bedtime., Disp: , Rfl:    valsartan-hydrochlorothiazide (DIOVAN-HCT) 160-12.5 MG tablet, TAKE ONE (1) TABLET EACH DAY (Needs to be seen before next refill), Disp: 90 tablet, Rfl: 1   zolpidem (AMBIEN) 10 MG tablet, Take 1 tablet (10 mg total) by mouth at bedtime as needed. for sleep, Disp: 30 tablet, Rfl: 5   Medications ordered in this encounter:  Meds ordered this encounter  Medications   azithromycin (ZITHROMAX) 250 MG tablet    Sig: Take 2 tablets on day 1, then 1 tablet daily on days 2 through 5    Dispense:  6 tablet    Refill:  0    Order Specific Question:   Supervising Provider    Answer:   LChase Picket[[[id number]]    *If you need refills on other medications prior to your next appointment, please contact your pharmacy*  Follow-Up: Call back or seek an in-person evaluation if the symptoms worsen or if the condition fails to improve as anticipated.  [facility]([phone] Other Instructions May alternate with liquid Tylenol and Motrin for pain and fever relief every 6 6 to 8 hours Warm salt water gargles or tea with honey to help soothe the throat   If you have been instructed  to have an in-person evaluation today at a local Urgent Care facility, please use the link below. It will take you to a list of all of our available [facility], including address, phone number and hours of operation. Please do not delay care.  [facility]  If you or a family member do not have a primary care provider, use the link below to schedule a visit and establish care. When you choose a [facility] primary care physician or advanced practice provider, you gain a long-term partner in health. Find a Primary Care Provider  Learn more about [facility]'s in-office and virtual care  options: [facility]

## 2022-11-11 ENCOUNTER — Other Ambulatory Visit: Payer: Self-pay | Admitting: Nurse Practitioner

## 2022-11-23 ENCOUNTER — Other Ambulatory Visit (HOSPITAL_COMMUNITY): Payer: Self-pay | Admitting: Hematology and Oncology

## 2022-12-27 ENCOUNTER — Other Ambulatory Visit: Payer: Self-pay | Admitting: Nurse Practitioner

## 2022-12-27 DIAGNOSIS — I10 Essential (primary) hypertension: Secondary | ICD-10-CM

## 2023-03-01 ENCOUNTER — Other Ambulatory Visit: Payer: Self-pay | Admitting: Nurse Practitioner

## 2023-03-01 MED ORDER — OSELTAMIVIR PHOSPHATE 75 MG PO CAPS: 75.0000 mg | ORAL_CAPSULE | Freq: Every day | ORAL | 0 refills | Status: AC

## 2023-03-01 NOTE — Progress Notes (Signed)
75

## 2023-03-31 ENCOUNTER — Ambulatory Visit: Payer: 59 | Admitting: Nurse Practitioner

## 2023-03-31 ENCOUNTER — Encounter: Payer: Self-pay | Admitting: Nurse Practitioner

## 2023-03-31 VITALS — BP 112/69 | HR 68 | Ht 69.0 in | Wt 210.0 lb

## 2023-03-31 DIAGNOSIS — K529 Noninfective gastroenteritis and colitis, unspecified: Secondary | ICD-10-CM

## 2023-03-31 DIAGNOSIS — Z23 Encounter for immunization: Secondary | ICD-10-CM | POA: Diagnosis not present

## 2023-03-31 DIAGNOSIS — F3342 Major depressive disorder, recurrent, in full remission: Secondary | ICD-10-CM

## 2023-03-31 DIAGNOSIS — E039 Hypothyroidism, unspecified: Secondary | ICD-10-CM

## 2023-03-31 DIAGNOSIS — I10 Essential (primary) hypertension: Secondary | ICD-10-CM

## 2023-03-31 DIAGNOSIS — E7800 Pure hypercholesterolemia, unspecified: Secondary | ICD-10-CM | POA: Diagnosis not present

## 2023-03-31 DIAGNOSIS — K219 Gastro-esophageal reflux disease without esophagitis: Secondary | ICD-10-CM

## 2023-03-31 DIAGNOSIS — F5101 Primary insomnia: Secondary | ICD-10-CM

## 2023-03-31 MED ORDER — LEVOTHYROXINE SODIUM 150 MCG PO TABS
150.0000 ug | ORAL_TABLET | Freq: Every day | ORAL | 1 refills | Status: DC
Start: 2023-03-31 — End: 2023-12-13

## 2023-03-31 MED ORDER — METOPROLOL SUCCINATE ER 50 MG PO TB24: ORAL_TABLET | ORAL | 1 refills | Status: DC

## 2023-03-31 MED ORDER — FENOFIBRATE 160 MG PO TABS
ORAL_TABLET | ORAL | 1 refills | Status: DC
Start: 2023-03-31 — End: 2023-10-24

## 2023-03-31 MED ORDER — DIPHENOXYLATE-ATROPINE 2.5-0.025 MG PO TABS: ORAL_TABLET | ORAL | 2 refills | Status: DC

## 2023-03-31 MED ORDER — ZOLPIDEM TARTRATE 10 MG PO TABS
10.0000 mg | ORAL_TABLET | Freq: Every evening | ORAL | 5 refills | Status: DC | PRN
Start: 2023-03-31 — End: 2023-12-13

## 2023-03-31 MED ORDER — VALSARTAN-HYDROCHLOROTHIAZIDE 160-12.5 MG PO TABS: ORAL_TABLET | ORAL | 1 refills | Status: DC

## 2023-03-31 MED ORDER — ESCITALOPRAM OXALATE 10 MG PO TABS
10.0000 mg | ORAL_TABLET | Freq: Every day | ORAL | 1 refills | Status: DC
Start: 2023-03-31 — End: 2023-11-24

## 2023-03-31 MED ORDER — PANTOPRAZOLE SODIUM 40 MG PO TBEC: 40.0000 mg | DELAYED_RELEASE_TABLET | Freq: Every day | ORAL | 1 refills | Status: DC | PRN

## 2023-03-31 NOTE — Patient Instructions (Signed)
Sleep Study, Adult A sleep study (polysomnogram) is a series of tests done while you are sleeping. It is used to see how well you sleep, help diagnose a sleep disorder, or create a plan to treat your sleep disorder. Sleep studies are done at sleep centers, which may be inside a hospital, office, or clinic. Your health care provider may recommend a sleep study if you: Have brief periods in which you stop breathing during sleep (sleepapnea). Fall asleep suddenly during the day (narcolepsy). Have trouble falling asleep or staying asleep (insomnia). Feel like you need to move your legs when trying to fall asleep (restless legs syndrome). Move your legs by flexing and extending them regularly while asleep (periodic limb movement disorder). Act out your dreams while you sleep (sleep behavior disorder). Feel like you cannot move when you first wake up (sleep paralysis). What tests are part of a sleep study? Most sleep studies record the following during sleep: Brain activity. Eye and limb movements. Heart rate and rhythm and blood pressure. Breathing rate and rhythm and blood oxygen level. Chest and belly movement as you breathe. Snoring or other noises. Body position. Tell a health care provider about: Any allergies you have, especially to products with sticky surfaces (adhesives). All medicines you are taking, including vitamins, herbs, eye drops, creams, and over-the-counter medicines. Any medical conditions you have. What happens before the test? Your health care provider will let you know if you should stop taking any of your regular medicines before the test. Bring your pajamas and toothbrush with you to the sleep study. Do not have caffeine on the day of your sleep study. Do not drink alcohol on the day of your sleep study. What happens during the test?     Most sleep studies are done during a normal period of time for a full night of sleep. You will arrive at the study center in the  evening and go home in the morning. The room where you have the study may look like a hospital room or a hotel room. For the test: Round, sticky patches with sensors attached to recording wires (electrodes) will be placed on your scalp, face, chest, and limbs. Wires from all the electrodes and sensors will run from your bed to a computer. The wires can be taken off and put back on if you need to get out of bed to go to the bathroom. A sensor will be placed over your nose to measure airflow. A finger clip will be put on your finger or ear to measure your blood oxygen level (pulse oximetry). Belts will be placed around your belly and chest to measure breathing movements. Monitoring will begin. The health care providers doing the study may come in and out of the room during the study. Most of the time, they will be in another room monitoring your test as you sleep. If you have signs of sleep apnea during your test, you may get a treatment mask to wear for the second half of the night. The mask provides positive airway pressure (PAP) to help you breathe better during sleep. This may greatly improve your sleep apnea. You will then have all tests done again with the mask in place to see if your measurements and recordings change. What can I expect after the test? A health care provider who specializes in sleep will evaluate the results of your sleep study and share them with you and your primary health care provider. Based on your results, your medical history,   and a physical exam, you may be diagnosed with a sleep disorder. Your health care team will help determine your treatment options based on your diagnosis. This may include: Improving your sleep habits (sleep hygiene). Wearing a continuous positive airway pressure (CPAP) or bi-level positive airway pressure (BIPAP) mask. Wearing an oral device at night to improve breathing and reduce snoring. Taking medicines. Follow these instructions at  home: Take over-the-counter and prescription medicines only as told by your health care provider. Make any lifestyle changes that your health care provider recommends. If you are instructed to use a CPAP or BIPAP mask, make sure you use it nightly as directed. If you were given a device to open your airway while you sleep, use it only as told by your health care provider. Do not use any products that contain nicotine or tobacco. These products include cigarettes, chewing tobacco, and vaping devices, such as e-cigarettes. If you need help quitting, ask your health care provider. Summary A sleep study is a series of tests done while you are sleeping. It shows how well you sleep. Most sleep studies are done over one full night of sleep. You will arrive at the study center in the evening and go home in the morning. If you have signs of the sleep disorder called sleep apnea during your test, you may get a treatment mask to wear for the second half of the night. A health care provider who specializes in sleep will evaluate the results of your sleep study and share them with your primary health care provider. This information is not intended to replace advice given to you by your health care provider. Make sure you discuss any questions you have with your health care provider. Document Revised: 03/10/2022 Document Reviewed: 03/10/2022 Elsevier Patient Education  2023 Elsevier Inc.  

## 2023-03-31 NOTE — Progress Notes (Signed)
Subjective:    Patient ID: Casey [name], female    DOB: 03-01-1961, 62 y.o.   MRN: [id number]   Chief Complaint: Medical Management of Chronic Issues, Hypertension, and Hypothyroidism    HPI:  Casey [name] is a 62 y.o. who identifies as a female who was assigned female at birth.   Social history: Lives with: husband Work history: Engineer, site   Comes in today for follow up of the following chronic medical issues:  1. Need for shingles vaccine Will get shingles vaccine  2. Primary hypertension No c/o chest pain, sob or headache. Doe snot check blood pressure at home. ] BP Readings from Last 3 Encounters:  03/31/23 112/69  10/11/22 117/63  04/08/22 110/69     3. Pure hypercholesterolemia Does try to watch diet but does no dedicated exercise. Lab Results  Component Value Date   CHOL 147 10/11/2022   HDL 59 10/11/2022   LDLCALC 72 10/11/2022   TRIG 84 10/11/2022   CHOLHDL 2.5 10/11/2022     4. 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 Is on lomotil and is doing well.  5. Gastroesophageal reflux disease without esophagitis Is on protonix daily and is  doing well.  6. Acquired hypothyroidism Lots of fatigue- will recheck levels today  7. Recurrent major depressive disorder, in full remission (HCC) Is on lexapro an dis dong well    03/31/2023    3:46 PM 10/11/2022    4:09 PM 04/08/2022    4:11 PM  Depression screen PHQ 2/9  Decreased Interest 0 1 0  Down, Depressed, Hopeless 0 0 0  PHQ - 2 Score 0 1 0  Altered sleeping 2 0 0  Tired, decreased energy 2 1 0  Change in appetite 2 0 0  Feeling bad or failure about yourself  0 0 0  Trouble concentrating 1 0 0  Moving slowly or fidgety/restless 2 0 0  Suicidal thoughts 0 0 0  PHQ-9 Score 9 2 0  Difficult doing work/chores Somewhat difficult Not difficult at all Not difficult at all     8. Primary insomnia Is on Ambien to sleep and is still not sleeping well. Feels tired  in mornings. Wakes up frequently  9. Morbid obesity (HCC) Weight is up 6lbs Wt Readings from Last 3 Encounters:  03/31/23 210 lb (95.3 kg)  10/11/22 204 lb (92.5 kg)  04/08/22 205 lb (93 kg)   BMI Readings from Last 3 Encounters:  03/31/23 31.01 kg/m  10/11/22 30.13 kg/m  04/08/22 30.27 kg/m      New complaints: Patient has had diarrhea for over 7 years. She says she has had constipation and strain painful bowel movement. Is time for colonoscopy.  Allergies  Allergen Reactions   Adhesive  [Tape] Itching    BANDAIDS   Benzalkonium    Buprenorphine Hcl Itching   Labetalol Itching   Morphine And Related Itching   Other     [place] seafood seasoning    Wound Dressings    Zosyn [Piperacillin Sod-Tazobactam So] Rash    Pt states has had dose since and did not have difficulty, rash only on first dose , not in doses following    Outpatient Encounter Medications as of 03/31/2023  Medication Sig   cetirizine (ZYRTEC) 10 MG tablet TAKE ONE (1) TABLET EACH DAY   cyanocobalamin (,VITAMIN B-12,) 1000 MCG/ML injection 1ml every 14 days   diphenoxylate-atropine (LOMOTIL) 2.5-0.025 MG tablet 1 po qid prn   escitalopram (  LEXAPRO) 10 MG tablet Take 1 tablet (10 mg total) by mouth daily.   fenofibrate 160 MG tablet TAKE ONE (1) TABLET EACH DAY   fluticasone (FLONASE) 50 MCG/ACT nasal spray Place 2 sprays into both nostrils daily.   folic acid (FOLVITE) 1 MG tablet TAKE ONE (1) TABLET EACH DAY   levothyroxine (SYNTHROID) 150 MCG tablet Take 1 tablet (150 mcg total) by mouth daily.   metoprolol succinate (TOPROL-XL) 50 MG 24 hr tablet TAKE ONE (1) TABLET EACH DAY   pantoprazole (PROTONIX) 40 MG tablet Take 1 tablet (40 mg total) by mouth daily as needed. (Needs to be seen before next refill)   promethazine (PHENERGAN) 25 MG tablet TAKE 1 TABLET EVERY 6 HOURS AS NEEDED FOR NAUSEA AND VOMITING   RESTASIS 0.05 % ophthalmic emulsion 2 drops in the morning and at bedtime.    valsartan-hydrochlorothiazide (DIOVAN-HCT) 160-12.5 MG tablet TAKE ONE (1) TABLET EACH DAY (Needs to be seen before next refill)   zolpidem (AMBIEN) 10 MG tablet Take 1 tablet (10 mg total) by mouth at bedtime as needed. for sleep   No facility-administered encounter medications on file as of 03/31/2023.    Past Surgical History:  Procedure Laterality Date   APPENDECTOMY     CRYOTHERAPY     for abnormal pap   DIAGNOSTIC LAPAROSCOPY     DILATION AND CURETTAGE OF UTERUS     infertility testing      LAPAROSCOPIC PARTIAL COLECTOMY N/A 02/02/2016   Procedure: LAPAROSCOPIC PARTIAL COlectomy right colon;  Surgeon: Gaynelle Adu, MD;  Location: [facility];  Service: General;  Laterality: N/A;   OOPHORECTOMY     & fallopian tubes   TONSILLECTOMY     VAGINAL HYSTERECTOMY     total   VESICOVAGINAL FISTULA CLOSURE W/ TAH     Dr. Myrlene Broker  2 degree DUB     Family History  Problem Relation Age of Onset   Hypertension Mother    Irregular heart beat Mother    Thyroid disease Maternal Grandmother    Cancer Maternal Grandmother        lung   Cancer Paternal Grandfather        lung   Breast cancer Neg Hx       Controlled substance contract: n/a     Review of Systems  Constitutional:  Negative for diaphoresis.  Eyes:  Negative for pain.  Respiratory:  Negative for shortness of breath.   Cardiovascular:  Negative for chest pain, palpitations and leg swelling.  Gastrointestinal:  Negative for abdominal pain.  Endocrine: Negative for polydipsia.  Skin:  Negative for rash.  Neurological:  Negative for dizziness, weakness and headaches.  Hematological:  Does not bruise/bleed easily.  All other systems reviewed and are negative.      Objective:   Physical Exam Vitals and nursing note reviewed.  Constitutional:      General: She is not in acute distress.    Appearance: Normal appearance. She is well-developed.  HENT:     Head: Normocephalic.     Right Ear: Tympanic membrane normal.      Left Ear: Tympanic membrane normal.     Nose: Nose normal.     Mouth/Throat:     Mouth: Mucous membranes are moist.  Eyes:     Pupils: Pupils are equal, round, and reactive to light.  Neck:     Vascular: No carotid bruit or JVD.  Cardiovascular:     Rate and Rhythm: Normal rate and regular rhythm.  Heart sounds: Normal heart sounds.  Pulmonary:     Effort: Pulmonary effort is normal. No respiratory distress.     Breath sounds: Normal breath sounds. No wheezing or rales.  Chest:     Chest wall: No tenderness.  Abdominal:     General: Bowel sounds are normal. There is no distension or abdominal bruit.     Palpations: Abdomen is soft. There is no hepatomegaly, splenomegaly, mass or pulsatile mass.     Tenderness: There is no abdominal tenderness.  Musculoskeletal:        General: Normal range of motion.     Cervical back: Normal range of motion and neck supple.  Lymphadenopathy:     Cervical: No cervical adenopathy.  Skin:    General: Skin is warm and dry.  Neurological:     Mental Status: She is alert and oriented to person, place, and time.     Deep Tendon Reflexes: Reflexes are normal and symmetric.  Psychiatric:        Behavior: Behavior normal.        Thought Content: Thought content normal.        Judgment: Judgment normal.    BP 112/69   Pulse 68   Ht 5\' 9"  (1.753 m)   Wt 210 lb (95.3 kg)   LMP 05/23/1995   SpO2 96%   BMI 31.01 kg/m         Assessment & Plan:   Casey [name] comes in today with chief complaint of Medical Management of Chronic Issues, Hypertension, and Hypothyroidism   Diagnosis and orders addressed:  1. Need for shingles vaccine - Zoster Recombinant (Shingrix )  2. Primary hypertension Low sodium diet - metoprolol succinate (TOPROL-XL) 50 MG 24 hr tablet; TAKE ONE (1) TABLET EACH DAY  Dispense: 90 tablet; Refill: 1 - valsartan-hydrochlorothiazide (DIOVAN-HCT) 160-12.5 MG tablet; TAKE ONE (1) TABLET EACH DAY (Needs to be seen  before next refill)  Dispense: 90 tablet; Refill: 1 - CBC with Differential/Platelet - CMP14+EGFR  3. Pure hypercholesterolemia Low fat diet - fenofibrate 160 MG tablet; TAKE ONE (1) TABLET EACH DAY  Dispense: 90 tablet; Refill: 1 - Lipid panel  4. Chronic diarrhea Keep diary of bowel movements - Ambulatory referral to Gastroenterology - diphenoxylate-atropine (LOMOTIL) 2.5-0.025 MG tablet; 1 po qid prn  Dispense: 120 tablet; Refill: 2  5. Gastroesophageal reflux disease without esophagitis Avoid spicy foods Do not eat 2 hours prior to bedtime  - pantoprazole (PROTONIX) 40 MG tablet; Take 1 tablet (40 mg total) by mouth daily as needed. (Needs to be seen before next refill)  Dispense: 90 tablet; Refill: 1  6. Acquired hypothyroidism Lab soending - levothyroxine (SYNTHROID) 150 MCG tablet; Take 1 tablet (150 mcg total) by mouth daily.  Dispense: 90 tablet; Refill: 1 - Thyroid Panel With TSH - Vitamin B12 - VITAMIN D 25 Hydroxy (Vit-D Deficiency, Fractures)  7. Recurrent major depressive disorder, in full remission (HCC) Stress management - escitalopram (LEXAPRO) 10 MG tablet; Take 1 tablet (10 mg total) by mouth daily.  Dispense: 90 tablet; Refill: 1  8. Primary insomnia Bedtime routine - Ambulatory referral to Sleep Studies - zolpidem (AMBIEN) 10 MG tablet; Take 1 tablet (10 mg total) by mouth at bedtime as needed. for sleep  Dispense: 30 tablet; Refill: 5  9. Morbid obesity (HCC) Discussed diet and exercise for person with BMI >25 Will recheck weight in 3-6 months    Labs pending Health Maintenance reviewed Diet and exercise encouraged  Follow up plan: 6  months   [name] Hassell Done, FNP

## 2023-04-01 LAB — LIPID PANEL
Chol/HDL Ratio: 2.4 ratio (ref 0.0–4.4)
Cholesterol, Total: 142 mg/dL (ref 100–199)
HDL: 59 mg/dL (ref >39)
LDL Chol Calc (NIH): 65 mg/dL (ref 0–99)
Triglycerides: 101 mg/dL (ref 0–149)
VLDL Cholesterol Cal: 18 mg/dL (ref 5–40)

## 2023-04-01 LAB — VITAMIN B12: Vitamin B-12: 645 pg/mL (ref 232–1245)

## 2023-04-01 LAB — THYROID PANEL WITH TSH
Free Thyroxine Index: 3 (ref 1.2–4.9)
T3 Uptake Ratio: 29 % (ref 24–39)
T4, Total: 10.2 ug/dL (ref 4.5–12.0)
TSH: 1.96 u[IU]/mL (ref 0.450–4.500)

## 2023-04-01 LAB — CBC WITH DIFFERENTIAL/PLATELET
Basophils Absolute: 0.1 10*3/uL (ref 0.0–0.2)
Basos: 1 %
EOS (ABSOLUTE): 0.2 10*3/uL (ref 0.0–0.4)
Eos: 3 %
Hematocrit: 35.8 % (ref 34.0–46.6)
Hemoglobin: 11.9 g/dL (ref 11.1–15.9)
Immature Grans (Abs): 0 10*3/uL (ref 0.0–0.1)
Immature Granulocytes: 1 %
Lymphocytes Absolute: 2.2 10*3/uL (ref 0.7–3.1)
Lymphs: 25 %
MCH: 29.1 pg (ref 26.6–33.0)
MCHC: 33.2 g/dL (ref 31.5–35.7)
MCV: 88 fL (ref 79–97)
Monocytes Absolute: 1.1 10*3/uL — ABNORMAL HIGH (ref 0.1–0.9)
Monocytes: 12 %
Neutrophils Absolute: 5.2 10*3/uL (ref 1.4–7.0)
Neutrophils: 58 %
Platelets: 214 10*3/uL (ref 150–450)
RBC: 4.09 x10E6/uL (ref 3.77–5.28)
RDW: 12.5 % (ref 11.7–15.4)
WBC: 8.9 10*3/uL (ref 3.4–10.8)

## 2023-04-01 LAB — CMP14+EGFR
ALT: 14 IU/L (ref 0–32)
AST: 14 IU/L (ref 0–40)
Albumin/Globulin Ratio: 2 (ref 1.2–2.2)
Albumin: 4.4 g/dL (ref 3.9–4.9)
Alkaline Phosphatase: 45 IU/L (ref 44–121)
BUN/Creatinine Ratio: 21 (ref 12–28)
BUN: 28 mg/dL — ABNORMAL HIGH (ref 8–27)
Bilirubin Total: <0.2 mg/dL (ref 0.0–1.2)
CO2: 25 mmol/L (ref 20–29)
Calcium: 9.6 mg/dL (ref 8.7–10.3)
Chloride: 102 mmol/L (ref 96–106)
Creatinine, Ser: 1.34 mg/dL — ABNORMAL HIGH (ref 0.57–1.00)
Globulin, Total: 2.2 g/dL (ref 1.5–4.5)
Glucose: 103 mg/dL — ABNORMAL HIGH (ref 70–99)
Potassium: 3.5 mmol/L (ref 3.5–5.2)
Sodium: 141 mmol/L (ref 134–144)
Total Protein: 6.6 g/dL (ref 6.0–8.5)
eGFR: 45 mL/min/{1.73_m2} — ABNORMAL LOW (ref >59)

## 2023-04-01 LAB — VITAMIN D 25 HYDROXY (VIT D DEFICIENCY, FRACTURES): Vit D, 25-Hydroxy: 15.2 ng/mL — ABNORMAL LOW (ref 30.0–100.0)

## 2023-04-01 MED ORDER — VITAMIN D (ERGOCALCIFEROL) 1.25 MG (50000 UNIT) PO CAPS: 50000.0000 [IU] | ORAL_CAPSULE | ORAL | 1 refills | Status: DC

## 2023-04-01 NOTE — Addendum Note (Signed)
Addended by: Bennie Pierini on: 04/01/2023 01:28 PM   Modules accepted: Orders

## 2023-04-11 ENCOUNTER — Ambulatory Visit: Payer: 59 | Admitting: Nurse Practitioner

## 2023-05-05 ENCOUNTER — Other Ambulatory Visit: Payer: Self-pay

## 2023-05-05 DIAGNOSIS — Z1231 Encounter for screening mammogram for malignant neoplasm of breast: Secondary | ICD-10-CM

## 2023-05-16 ENCOUNTER — Ambulatory Visit: Payer: 59 | Admitting: Neurology

## 2023-05-16 ENCOUNTER — Encounter: Payer: Self-pay | Admitting: Neurology

## 2023-05-16 VITALS — BP 137/67 | HR 58 | Ht 70.5 in | Wt 208.4 lb

## 2023-05-16 DIAGNOSIS — Z820 Family history of epilepsy and other diseases of the nervous system: Secondary | ICD-10-CM

## 2023-05-16 DIAGNOSIS — F439 Reaction to severe stress, unspecified: Secondary | ICD-10-CM

## 2023-05-16 DIAGNOSIS — R6889 Other general symptoms and signs: Secondary | ICD-10-CM | POA: Diagnosis not present

## 2023-05-16 DIAGNOSIS — G4700 Insomnia, unspecified: Secondary | ICD-10-CM | POA: Diagnosis not present

## 2023-05-16 DIAGNOSIS — G2581 Restless legs syndrome: Secondary | ICD-10-CM

## 2023-05-16 DIAGNOSIS — G4719 Other hypersomnia: Secondary | ICD-10-CM | POA: Diagnosis not present

## 2023-05-16 DIAGNOSIS — D373 Neoplasm of uncertain behavior of appendix: Secondary | ICD-10-CM

## 2023-05-16 DIAGNOSIS — R519 Headache, unspecified: Secondary | ICD-10-CM

## 2023-05-16 DIAGNOSIS — R351 Nocturia: Secondary | ICD-10-CM

## 2023-05-16 DIAGNOSIS — E663 Overweight: Secondary | ICD-10-CM

## 2023-05-16 DIAGNOSIS — R0683 Snoring: Secondary | ICD-10-CM | POA: Diagnosis not present

## 2023-05-16 NOTE — Progress Notes (Signed)
Subjective:    Casey [name] ID: Casey Casey [name] is a 62 y.o. female.  HPI    Casey Foley, MD, PhD [facility] [address] [address] [place], [place] [address]  Dear Casey Casey [name],  I saw your Casey [name], Casey Casey [name], upon your kind request in my sleep clinic today for initial consultation of her sleep disorder, in particular, concern for underlying obstructive sleep apnea.  Casey Casey [name] is accompanied by her husband today.  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  Her sleep difficulty dates back to 6 or 7 years ago when she had her cancer surgery.  She has not slept well since then. Her Epworth sleepiness score is 16 out of 24, fatigue severity score is 45 out of 63.  She has been on Ambien for years.  She is currently taking 10 mg each night, several years ago she tried Ambien CR.  She has occasional restless leg symptoms and needs to move her legs, sometimes gets out of bed and walks around.  She does snore with variable snoring reported per husband, sometimes she breathes so quietly that he checks on her.  She lives with her husband and currently they are 51 year old daughter moved in with her 21-year-old son.  I reviewed your office note from 03/31/2023.  She reports vivid dreams, does not always recall her dreams.  She has nocturia about once per average night, has had some morning headaches including reoccurrence of migraines lately, she used to have migraines more frequently but  they improved after hysterectomy.  She does not drink any alcohol, she is a non-smoker, she drinks caffeine in limitation.  She goes to bed around 9 or 9:30 PM, in Casey summertime may be as late as 11 PM now that school is out.  Her rise time is generally around 7:30 AM.  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  She has 3 grown children, ages 5, 56 year old son and 17 year old daughter, they have a total of 5 grandchildren.  Sometimes she takes Tylenol at night for restless leg symptoms, because of her extensive intestinal surgery she does not take any NSAIDs.   Her mom has sleep apnea and uses a CPAP machine.  She does fall asleep with Casey TV on at night, she feels that it relaxes her and distract her mind.  She does not endorse rumination of thought in Casey middle of Casey night.  He usually turns Casey TV off when he goes to bed.  She is hoping to retire in a year.  Her Past Medical History Is Significant For: Past Medical History:  Diagnosis Date   Abnormal Pap smear of cervix    1985   Anemia    Anxiety    Atypical nevus 12/15/2005   Right Scapula-Slight and Right Hip-Moderate to Marked (Exc.)   Atypical nevus 06/06/2013   Left Upper Back-Moderate, and Left Shin-Mild   Atypical nevus 06/20/2018   Right Deltoid-Moderate   BCC (basal cell carcinoma of skin) 06/23/2006   Right Neck   BCC (basal cell carcinoma of skin) 06/20/2018  Right Outerback (tx p bx)   Cancer (HCC)    premelanoma per hip    Cancer of appendix (HCC)    Chronic diarrhea    COVID-19 virus infection 10/2020   Depression    DUB (dysfunctional uterine bleeding)    Dysrhythmia    pt states has PVCs    Gallstones    GERD (gastroesophageal reflux disease)    Graves disease    History of blood transfusion    History of urinary tract infection    last one approx 4 years ago    Hyperlipidemia    Hypertension    Hypothyroidism    Insomnia    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    Migraines    Morbid obesity (HCC)    Ovarian cyst    both ovaries   Palpitations    Pneumonia    hx of had approx 22 years ago    PONV (postoperative nausea and vomiting)    Pulmonary nodule    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   Left Bulb Nose ([place])    Her Past Surgical History Is Significant For: Past Surgical History:  Procedure Laterality Date   APPENDECTOMY     CRYOTHERAPY     for abnormal pap   DIAGNOSTIC LAPAROSCOPY     DILATION AND CURETTAGE OF UTERUS     infertility testing      LAPAROSCOPIC PARTIAL COLECTOMY N/A 02/02/2016   Procedure: LAPAROSCOPIC PARTIAL COlectomy right colon;  Surgeon: Casey Adu, MD;  Location: [facility];  Service: General;  Laterality: N/A;   NECK SURGERY     ruptured disc 04/2021   OOPHORECTOMY     & fallopian tubes   SKIN CANCER EXCISION     2023   TONSILLECTOMY     VAGINAL HYSTERECTOMY     total   VESICOVAGINAL FISTULA CLOSURE W/ TAH     Dr. Myrlene [name]  2 degree DUB     Her Family History Is Significant For: Family History  Problem Relation Age of Onset   Cancer Mother        skin   Hypertension Mother    Irregular heart beat Mother    Myasthenia gravis Mother    Cancer Father        prostate and skin   Thyroid disease Maternal Grandmother    Cancer Maternal Grandmother        lung   Aneurysm Maternal Grandfather        stomach   Cancer Paternal Grandmother    Cancer Paternal Grandfather        lung   Heart disease Paternal Grandfather    Breast cancer Neg Hx     Her Social History Is Significant For: Social History   Socioeconomic History   Marital status: Married    Spouse name: Not on file   Number of children: Not on file   Years of education: Not on file   Highest education level: Not on file  Occupational History   Not on file  Tobacco Use   Smoking status: Never   Smokeless tobacco: Never  Vaping Use   Vaping Use: Never used   Substance and Sexual Activity   Alcohol use: Never   Drug use: Never   Sexual activity: Yes    Partners: Male    Birth control/protection: Surgical    Comment: TVH  Other Topics Concern   Not on file  Social History Narrative  Caffiene  48oz.   Lives with husband Brett Canales   Works at [employer].    3 children   Social Determinants of Corporate investment banker Strain: Not on file  Food Insecurity: Not on file  Transportation Needs: Not on file  Physical Activity: Not on file  Stress: Not on file  Social Connections: Not on file    Her Allergies Are:  Allergies  Allergen Reactions   Adhesive  [Tape] Itching    BANDAIDS   Benzalkonium    Buprenorphine Hcl Itching   Labetalol Itching   Morphine And Codeine Itching   Other     [place] seafood seasoning    Wound Dressings    Zosyn [Piperacillin Sod-Tazobactam So] Rash    Pt states has had dose since and did not have difficulty, rash only on first dose , not in doses following   :   Her Current Medications Are:  Outpatient Encounter Medications as of 05/16/2023  Medication Sig   cetirizine (ZYRTEC) 10 MG tablet TAKE ONE (1) TABLET EACH DAY   cyanocobalamin (,VITAMIN B-12,) 1000 MCG/ML injection 1ml every 14 days   diphenoxylate-atropine (LOMOTIL) 2.5-0.025 MG tablet 1 po qid prn   escitalopram (LEXAPRO) 10 MG tablet Take 1 tablet (10 mg total) by mouth daily.   fenofibrate 160 MG tablet TAKE ONE (1) TABLET EACH DAY   fluticasone (FLONASE) 50 MCG/ACT nasal spray Place 2 sprays into both nostrils daily.   folic acid (FOLVITE) 1 MG tablet TAKE ONE (1) TABLET EACH DAY   levothyroxine (SYNTHROID) 150 MCG tablet Take 1 tablet (150 mcg total) by mouth daily.   metoprolol succinate (TOPROL-XL) 50 MG 24 hr tablet TAKE ONE (1) TABLET EACH DAY   pantoprazole (PROTONIX) 40 MG tablet Take 1 tablet (40 mg total) by mouth daily as needed. (Needs to be seen before next refill)   promethazine (PHENERGAN) 25  MG tablet TAKE 1 TABLET EVERY 6 HOURS AS NEEDED FOR NAUSEA AND VOMITING   RESTASIS 0.05 % ophthalmic emulsion 2 drops in Casey morning and at bedtime.   valsartan-hydrochlorothiazide (DIOVAN-HCT) 160-12.5 MG tablet TAKE ONE (1) TABLET EACH DAY (Needs to be seen before next refill)   Vitamin D, Ergocalciferol, (DRISDOL) 1.25 MG (50000 UNIT) CAPS capsule Take 1 capsule (50,000 Units total) by mouth every 7 (seven) days.   zolpidem (AMBIEN) 10 MG tablet Take 1 tablet (10 mg total) by mouth at bedtime as needed. for sleep   No facility-administered encounter medications on file as of 05/16/2023.  :   Review of Systems:  Out of a complete 14 point review of systems, all are reviewed and negative with Casey exception of these symptoms as listed below:   Review of Systems  Neurological:        Pt having issues with waking at night multiple time with taking ambien.  ESS 16 FSS  45.  Has vivid dreams.     Objective:  Neurological Exam  Physical Exam Physical Examination:   Vitals:   05/16/23 0821  BP: 137/67  Pulse: (!) 58    General Examination: Casey Casey [name] is a very pleasant 62 y.o. female in no acute distress. She appears well-developed and well-nourished and well groomed.   HEENT: Normocephalic, atraumatic, pupils are equal, round and reactive to light, extraocular tracking is good without limitation to gaze excursion or nystagmus noted. Hearing is grossly intact. Face is symmetric with normal facial animation. Speech is clear with no dysarthria noted. There  is no hypophonia. There is no lip, neck/head, jaw or voice tremor. Neck is supple with full range of passive and active motion. There are no carotid bruits on auscultation. Oropharynx exam reveals: mild mouth dryness, adequate dental hygiene and mild airway crowding, due to small airway entry.  Tonsils absent.  Mallampati class II, neck circumference 15 3/8 inches.  Minimal to mild overbite noted.  Tongue protrudes centrally and palate  elevates symmetrically.  Chest: Clear to auscultation without wheezing, rhonchi or crackles noted.  Heart: S1+S2+0, regular and normal without murmurs, rubs or gallops noted.   Abdomen: Soft, non-tender and non-distended.  Extremities: There is no pitting edema in Casey distal lower extremities bilaterally.   Skin: Warm and dry without trophic changes noted.  Spider veins around ankles bilaterally, mild varicose veins right distal lower extremity.  Musculoskeletal: exam reveals no obvious joint deformities.   Neurologically:  Mental status: Casey Casey [name] is awake, alert and oriented in all 4 spheres. Her immediate and remote memory, attention, language skills and fund of knowledge are appropriate. There is no evidence of aphasia, agnosia, apraxia or anomia. Speech is clear with normal prosody and enunciation. Thought process is linear. Mood is normal and affect is normal.  Cranial nerves II - XII are as described above under HEENT exam.  Motor exam: Normal bulk, strength and tone is noted. There is no obvious action or resting tremor.  Fine motor skills and coordination: grossly intact.  Cerebellar testing: No dysmetria or intention tremor. There is no truncal or gait ataxia.  Sensory exam: intact to light touch in Casey upper and lower extremities.  Gait, station and balance: She stands easily. No veering to one side is noted. No leaning to one side is noted. Posture is age-appropriate and stance is narrow based. Gait shows normal stride length and normal pace. No problems turning are noted.   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 A laboratory attended sleep study is typically considered "gold standard" for evaluation of sleep disordered breathing.  PLM's can be evaluated with a laboratory study but not with a home sleep test.  She endorses restless leg symptoms.  We talked a little bit about restless leg syndrome as well today including certain medications that can exacerbate RLS symptoms and leg twitching such as SSRI type antidepressant medications. I had a long chat with Casey Casey [name] about my findings and Casey diagnosis of sleep apnea, particularly OSA, its prognosis and treatment options. We talked about medical/conservative treatments, surgical interventions and non-pharmacological approaches for symptom control. I explained, in particular, Casey risks and ramifications of untreated moderate to severe OSA, especially with respect to developing cardiovascular disease down Casey road, including congestive heart failure (CHF), difficult to treat hypertension, cardiac arrhythmias (particularly A-fib), neurovascular complications including TIA, stroke and dementia. Even type 2 diabetes has, in part, been linked to untreated OSA. Symptoms of untreated OSA may include (but may not be limited to) daytime sleepiness, nocturia (i.e. frequent nighttime urination), memory problems, mood irritability and suboptimally controlled or worsening mood disorder such as depression and/or anxiety, lack of energy, lack of motivation, physical discomfort,  as well as recurrent headaches, especially morning or nocturnal headaches. We talked about Casey importance of maintaining a healthy lifestyle and striving for healthy weight. In addition, we talked about Casey importance of striving for and maintaining good sleep hygiene. I recommended a sleep study at this time. I outlined Casey differences between a  laboratory attended sleep study which is considered more comprehensive and accurate over Casey option of a home sleep test (HST); Casey latter may lead to underestimation of sleep disordered breathing in some instances and does not help with diagnosing upper airway resistance syndrome and is not accurate enough to diagnose primary central sleep apnea typically. I outlined possible surgical and non-surgical treatment options of OSA, including Casey use of a positive airway pressure (PAP) device (i.e. CPAP, AutoPAP/APAP or BiPAP in certain circumstances), a custom-made dental device (aka oral appliance, which would require a referral to a specialist dentist or orthodontist typically, and is generally speaking not considered for patients with full dentures or edentulous state), upper airway surgical options, such as traditional UPPP (which is not considered a first-line treatment) or Casey Inspire device (hypoglossal nerve stimulator, which would involve a referral for consultation with an ENT surgeon, after careful selection, following inclusion criteria - also not first-line treatment). I explained Casey PAP treatment option to Casey Casey [name] in detail, as this is generally considered first-line treatment.  Casey Casey [name] indicated that she would be willing to try PAP therapy, if Casey need arises. I explained Casey importance of being compliant with PAP treatment, not only for insurance purposes but primarily to improve Casey [name]'s symptoms symptoms, and for Casey Casey [name]'s long term health benefit, including to reduce Her cardiovascular risks longer-term.    We will pick up our discussion about Casey next steps and treatment options after testing.  We will keep her posted as to Casey test results by phone call and/or MyChart messaging where possible.  We will plan to follow-up in sleep clinic accordingly as well.  I answered all their questions today and Casey Casey [name] and and her husband were in agreement.   For chronic insomnia, she is  encouraged to talk to you about referral to a psychologist, particularly sleep psychologist to consider cognitive behavioral therapy (CBT-I).   I encouraged her to call with any interim questions, concerns, problems or updates or email [place] through MyChart.  Generally speaking, sleep test authorizations may take up to 2 weeks, sometimes less, sometimes longer, Casey Casey [name] is encouraged to get in touch with [place] if they do not hear back from Casey sleep lab staff directly within Casey next 2 weeks. This was an extended visit of over 1 hour with extended counseling involved and addressing multiple issues including restless leg syndrome, insomnia and sleep apnea. Thank you very much for allowing me to participate in Casey care of this nice Casey [name]. If I can be of any further assistance to you please do not hesitate to call me at [phone].  Sincerely,   Casey Foley, MD, PhD

## 2023-05-16 NOTE — Patient Instructions (Signed)

## 2023-05-18 ENCOUNTER — Ambulatory Visit
Admission: RE | Admit: 2023-05-18 | Discharge: 2023-05-18 | Disposition: A | Discharge status: Home / Self Care | Payer: 59 | Source: Ambulatory Visit | Attending: Family Medicine | Admitting: Family Medicine

## 2023-05-18 DIAGNOSIS — Z1231 Encounter for screening mammogram for malignant neoplasm of breast: Secondary | ICD-10-CM

## 2023-05-27 ENCOUNTER — Telehealth: Payer: Self-pay | Admitting: Neurology

## 2023-05-27 NOTE — Telephone Encounter (Signed)
NPSG- [employer] pending uploaded notes.  

## 2023-05-30 NOTE — Telephone Encounter (Signed)
Checked status on the portal it is still pending.  

## 2023-06-07 NOTE — Telephone Encounter (Signed)
Noted, thank you. HST- [employer] no auth req.  

## 2023-06-07 NOTE — Telephone Encounter (Signed)
 HST ordered.

## 2023-06-07 NOTE — Addendum Note (Signed)
Addended by: Huston Foley on: 06/07/2023 08:07 AM   Modules accepted: Orders

## 2023-06-07 NOTE — Telephone Encounter (Signed)
 Noted  

## 2023-06-07 NOTE — Telephone Encounter (Signed)
[employer] denied the NPSG please see below for the reason. Do you want to order a HST?

## 2023-06-13 ENCOUNTER — Encounter: Payer: Self-pay | Admitting: Gastroenterology

## 2023-06-13 ENCOUNTER — Other Ambulatory Visit: Payer: Self-pay | Admitting: Nurse Practitioner

## 2023-06-28 ENCOUNTER — Ambulatory Visit: Payer: 59 | Admitting: Neurology

## 2023-06-28 DIAGNOSIS — Z820 Family history of epilepsy and other diseases of the nervous system: Secondary | ICD-10-CM

## 2023-06-28 DIAGNOSIS — F439 Reaction to severe stress, unspecified: Secondary | ICD-10-CM

## 2023-06-28 DIAGNOSIS — E663 Overweight: Secondary | ICD-10-CM

## 2023-06-28 DIAGNOSIS — R519 Headache, unspecified: Secondary | ICD-10-CM

## 2023-06-28 DIAGNOSIS — R0683 Snoring: Secondary | ICD-10-CM

## 2023-06-28 DIAGNOSIS — R351 Nocturia: Secondary | ICD-10-CM

## 2023-06-28 DIAGNOSIS — G4700 Insomnia, unspecified: Secondary | ICD-10-CM

## 2023-06-28 DIAGNOSIS — G4733 Obstructive sleep apnea (adult) (pediatric): Secondary | ICD-10-CM | POA: Diagnosis not present

## 2023-06-28 DIAGNOSIS — G2581 Restless legs syndrome: Secondary | ICD-10-CM

## 2023-06-28 DIAGNOSIS — D373 Neoplasm of uncertain behavior of appendix: Secondary | ICD-10-CM

## 2023-06-28 DIAGNOSIS — R6889 Other general symptoms and signs: Secondary | ICD-10-CM

## 2023-06-28 DIAGNOSIS — G4719 Other hypersomnia: Secondary | ICD-10-CM

## 2023-07-05 NOTE — Procedures (Signed)
   [facility]  HOME SLEEP TEST (Watch PAT) REPORT  STUDY DATE: 06/28/23  DOB: [birth year]  MRN: [id number]  ORDERING CLINICIAN: Huston Foley, MD, PhD   REFERRING CLINICIAN: Daphine Deutscher, [name], FNP   CLINICAL INFORMATION/HISTORY: 62 year old female with an underlying complex medical history of cancer of the appendix with status post extensive surgery, chemotherapy once, history of cholelithiasis, dysfunctional uterine bleeding, Graves' disease, migraine headaches, ovarian cyst, palpitations, pulmonary nodule,hypertension, hyperlipidemia, reflux disease, chronic diarrhea, hypothyroidism, and obesity, who reports snoring and excessive daytime somnolence as well as difficulty sleeping at night.   Epworth sleepiness score: 16/24.  BMI: 29.7 kg/m  FINDINGS:   Sleep Summary:   Total Recording Time (hours, min): 9 hours, 2 min  Total Sleep Time (hours, min):  8 hours, 31 min  Percent REM (%):    20.2%   Respiratory Indices:   Calculated pAHI (per hour):  12.2/hour         REM pAHI:    21.7/hour       NREM pAHI: 9.8/hour  Central pAHI: 0.2/hour  Oxygen Saturation Statistics:    Oxygen Saturation (%) Mean: 94%   Minimum oxygen saturation (%):                 85%   O2 Saturation Range (%): 85-97%    O2 Saturation (minutes) <=88%: 0.6 min  Pulse Rate Statistics:   Pulse Mean (bpm):    54/min    Pulse Range (40-88/min)   IMPRESSION: OSA (obstructive sleep apnea), mild  RECOMMENDATION:  This home sleep test demonstrates overall mild obstructive sleep apnea with a total AHI of 12.2/hour and O2 nadir of 85%. Snoring was detected, and appeared to be intermittent in the mild to moderate range. Given the patient's medical history and sleep related complaints, therapy is recommended. Treatment can be achieved in the form of autoPAP trial/titration at home for now. A full night, in-lab PAP titration study may aid in improving proper treatment settings and  with mask fit, if needed, down the road. Alternative treatments may include weight loss (where appropriate) along with avoidance of the supine sleep position (if possible), or an oral appliance in appropriate candidates.   Please note that untreated obstructive sleep apnea may carry additional perioperative morbidity. Patients with significant obstructive sleep apnea should receive perioperative PAP therapy and the surgeons and particularly the anesthesiologist should be informed of the diagnosis and the severity of the sleep disordered breathing. The patient should be cautioned not to drive, work at heights, or operate dangerous or heavy equipment when tired or sleepy. Review and reiteration of good sleep hygiene measures should be pursued with any patient. Other causes of the patient's symptoms, including circadian rhythm disturbances, an underlying mood disorder, medication effect and/or an underlying medical problem cannot be ruled out based on this test. Clinical correlation is recommended.  The patient and her referring provider will be notified of the test results. The patient will be seen in follow up in sleep clinic at [facility], as necessary.  I certify that I have reviewed the raw data recording prior to the issuance of this report in accordance with the standards of the American Academy of Sleep Medicine (AASM).  INTERPRETING PHYSICIAN:   Huston Foley, MD, PhD Medical Director, [facility]) Diplomat, ABPN (Neurology and Sleep)   [facility] [address] [place], [place] [address] [phone]

## 2023-07-05 NOTE — Addendum Note (Signed)
Addended by: Huston Foley on: 07/05/2023 01:03 PM   Modules accepted: Orders

## 2023-07-07 NOTE — Telephone Encounter (Addendum)
Casey [name], CMA  Casey [name], Casey [name], Casey [name] New orders have been placed for the above pt, DOB:03/19/1961 Thanks  New, Casey [name], CMA; Casey [name]; New, Casey [name], Casey [name]; 1 other Received, thank you!

## 2023-09-13 ENCOUNTER — Telehealth: Payer: Self-pay

## 2023-09-13 NOTE — Telephone Encounter (Signed)
My chart message sent with ESS

## 2023-09-14 ENCOUNTER — Ambulatory Visit: Payer: 59 | Admitting: Gastroenterology

## 2023-09-14 ENCOUNTER — Telehealth: Payer: 59 | Admitting: Neurology

## 2023-09-14 ENCOUNTER — Encounter: Payer: Self-pay | Admitting: Gastroenterology

## 2023-09-14 VITALS — BP 112/66 | HR 62 | Ht 70.0 in | Wt 207.8 lb

## 2023-09-14 DIAGNOSIS — K529 Noninfective gastroenteritis and colitis, unspecified: Secondary | ICD-10-CM | POA: Diagnosis not present

## 2023-09-14 DIAGNOSIS — R1032 Left lower quadrant pain: Secondary | ICD-10-CM

## 2023-09-14 DIAGNOSIS — G4733 Obstructive sleep apnea (adult) (pediatric): Secondary | ICD-10-CM | POA: Diagnosis not present

## 2023-09-14 MED ORDER — NA SULFATE-K SULFATE-MG SULF 17.5-3.13-1.6 GM/177ML PO SOLN: 1.0000 | Freq: Once | ORAL | 0 refills | Status: AC

## 2023-09-14 MED ORDER — CHOLESTYRAMINE 4 G PO PACK: 4.0000 g | PACK | Freq: Every day | ORAL | 0 refills | Status: AC

## 2023-09-14 NOTE — Progress Notes (Deleted)
[facility] Consult Note:  History: Casey [name] 09/14/2023  Referring provider: Bennie Pierini, FNP  Reason for consult/chief complaint: No chief complaint on file.   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  (Dr. Andrey Campanile).  Had a subsequent colonoscopy by Dr. Madilyn Fireman at [facility] in August 2018 for follow-up of this.  Healthy appearing ileocolonic anastomosis seen, reportedly normal mucosa (no biopsies taken/needed).  5-year recall recommended. She is followed by the surgical service at [facility] in [place], most recently last week with note including the following:"62 yo female s/p R0R1 CRS/HIPEC on 03/29/2016 for a LGA primary. NED on imaging and physical. RTC in 2 years with imaging. "  ***   ROS:  Review of Systems   Past Medical History: Past Medical History:  Diagnosis Date   Abnormal Pap smear of cervix    1985   Anemia    Anxiety    Atypical nevus 12/15/2005   Right Scapula-Slight and Right Hip-Moderate to Marked (Exc.)   Atypical nevus 06/06/2013   Left Upper Back-Moderate, and Left Shin-Mild   Atypical nevus 06/20/2018   Right Deltoid-Moderate   BCC (basal cell carcinoma of skin) 06/23/2006   Right Neck   BCC (basal cell carcinoma of skin) 06/20/2018   Right Outerback (tx p bx)   Cancer (HCC)    premelanoma per hip    Cancer of appendix (HCC)    Chronic diarrhea    COVID-19 virus infection 10/2020   Depression    DUB (dysfunctional uterine bleeding)    Dysrhythmia    pt states has PVCs    Gallstones    GERD (gastroesophageal reflux disease)    Graves disease    History of blood transfusion    History of urinary tract infection    last one approx 4 years ago    Hyperlipidemia    Hypertension    Hypothyroidism    Insomnia    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    [id number]    Migraines    Morbid obesity (HCC)    Ovarian cyst    both ovaries   Palpitations    Pneumonia    hx of had approx 22 years ago    PONV (postoperative nausea and vomiting)    Pulmonary nodule    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   Left Bulb Nose ([place])     Past Surgical History: Past Surgical History:  Procedure Laterality Date   APPENDECTOMY     CRYOTHERAPY     for abnormal pap   DIAGNOSTIC LAPAROSCOPY     DILATION AND CURETTAGE OF UTERUS     infertility testing      LAPAROSCOPIC PARTIAL COLECTOMY N/A 02/02/2016   Procedure: LAPAROSCOPIC PARTIAL COlectomy right colon;  Surgeon: Gaynelle Adu, MD;  Location: [facility];  Service: General;  Laterality: N/A;   NECK SURGERY     ruptured disc 04/2021   OOPHORECTOMY     & fallopian tubes   SKIN CANCER EXCISION     2023   TONSILLECTOMY     VAGINAL HYSTERECTOMY     total   VESICOVAGINAL FISTULA CLOSURE W/ TAH     Dr. Myrlene Broker  2 degree DUB      Family History: Family History  Problem Relation Age of Onset   Cancer Mother  skin   Hypertension Mother    Irregular heart beat Mother    Myasthenia gravis Mother    Cancer Father        prostate and skin   Thyroid disease Maternal Grandmother    Cancer Maternal Grandmother        lung   Aneurysm Maternal Grandfather        stomach   Cancer Paternal Grandmother    Cancer Paternal Grandfather        lung   Heart disease Paternal Grandfather    Breast cancer Neg Hx     Social History: Social History   Socioeconomic History   Marital status: Married    Spouse name: Not on file   Number of children: Not on file   Years of education: Not on file   Highest education level: Not on file  Occupational History   Not on file  Tobacco Use   Smoking status: Never   Smokeless tobacco: Never  Vaping Use   Vaping status: Never Used  Substance and Sexual Activity   Alcohol use: Never   Drug use: Never   Sexual activity: Yes    Partners:  Male    Birth control/protection: Surgical    Comment: TV[facility]  Other Topics Concern   Not on file  Social History Narrative   Caffiene  48oz.   Lives with husband Brett Canales   Works at [employer].    3 children   Social Determinants of Health   Financial Resource Strain: Not on file  Food Insecurity: Not on file  Transportation Needs: Not on file  Physical Activity: Not on file  Stress: Not on file  Social Connections: Not on file    Allergies: Allergies  Allergen Reactions   Adhesive  [Tape] Itching    BANDAIDS   Benzalkonium    Buprenorphine Hcl Itching   Labetalol Itching   Morphine And Codeine Itching   Other     [place] seasoning    Wound Dressings    Zosyn [Piperacillin Sod-Tazobactam So] Rash    Pt states has had dose since and did not have difficulty, rash only on first dose , not in doses following     Outpatient Meds: Current Outpatient Medications  Medication Sig Dispense Refill   cetirizine (ZYRTEC) 10 MG tablet TAKE ONE (1) TABLET EACH DAY 90 tablet 3   cyanocobalamin (,VITAMIN B-12,) 1000 MCG/ML injection 1ml every 14 days 30 mL 1   diphenoxylate-atropine (LOMOTIL) 2.5-0.025 MG tablet 1 po qid prn 120 tablet 2   escitalopram (LEXAPRO) 10 MG tablet Take 1 tablet (10 mg total) by mouth daily. 90 tablet 1   fenofibrate 160 MG tablet TAKE ONE (1) TABLET EACH DAY 90 tablet 1   fluticasone (FLONASE) 50 MCG/ACT nasal spray Place 2 sprays into both nostrils daily. 16 g 6   folic acid (FOLVITE) 1 MG tablet TAKE ONE (1) TABLET EACH DAY 90 tablet 3   levothyroxine (SYNTHROID) 150 MCG tablet Take 1 tablet (150 mcg total) by mouth daily. 90 tablet 1   metoprolol succinate (TOPROL-XL) 50 MG 24 hr tablet TAKE ONE (1) TABLET EACH DAY 90 tablet 1   pantoprazole (PROTONIX) 40 MG tablet Take 1 tablet (40 mg total) by mouth daily as needed. (Needs to be seen before next refill) 90 tablet 1   promethazine (PHENERGAN) 25 MG tablet TAKE 1 TABLET  EVERY 6 HOURS AS NEEDED FOR NAUSEA AND VOMITING 30 tablet 0  RESTASIS 0.05 % ophthalmic emulsion 2 drops in the morning and at bedtime.     valsartan-hydrochlorothiazide (DIOVAN-HCT) 160-12.5 MG tablet TAKE ONE (1) TABLET EACH DAY (Needs to be seen before next refill) 90 tablet 1   Vitamin D, Ergocalciferol, (DRISDOL) 1.25 MG (50000 UNIT) CAPS capsule TAKE 1 CAPSULE BY MOUTH EVERY 7 DAYS 12 capsule 1   zolpidem (AMBIEN) 10 MG tablet Take 1 tablet (10 mg total) by mouth at bedtime as needed. for sleep 30 tablet 5   No current facility-administered medications for this visit.      ___________________________________________________________________ Objective   Exam:  LMP 05/23/1995  Wt Readings from Last 3 Encounters:  05/16/23 208 lb 6.4 oz (94.5 kg)  03/31/23 210 lb (95.3 kg)  10/11/22 204 lb (92.5 kg)    General: ***  Eyes: sclera anicteric, no redness ENT: oral mucosa moist without lesions, no cervical or supraclavicular lymphadenopathy CV: ***, no JVD, no peripheral edema Resp: clear to auscultation bilaterally, normal RR and effort noted GI: soft, *** tenderness, with active bowel sounds. No guarding or palpable organomegaly noted. Skin; warm and dry, no rash or jaundice noted Neuro: awake, alert and oriented x 3. Normal gross motor function and fluent speech  Labs:  ***  Radiologic Studies:  Dennie Bible, MD - 09/09/2023 Formatting of this note might be different from the original. CT CHEST ABDOMEN PELVIS W CONTRAST, 09/09/2023 1:29 PM  INDICATION: Malignant neoplasm of appendix (CMD) \ C18.1 Malignant neoplasm of appendix (CMD) COMPARISON: CT abdomen pelvis 09/11/2021.  TECHNIQUE: Multislice axial images were obtained through the chest, abdomen, and pelvis with administration of iodinated intravenous contrast material. Multi-planar reformatted images were generated for additional analysis. Nongated technique limits cardiac detail.  All CT scans at [facility] and [facility] are performed using radiation dose optimization techniques as appropriate to a performed exam, including but not limited to one or more of the following: automatic exposure control, adjustment of the mA and/or kV according to patient size, use of iterative reconstruction technique. In addition, our institution participates in a radiation dose monitoring program to optimize patient radiation exposure.  FINDINGS:  CHEST: .  Thoracic inlet/axilla: 1.6 cm right thyroid lobe nodule. .  Central airways: Within normal limits. .  Mediastinum/hila: Within normal limits. .  Heart/vessel: Normal size. No  pericardial effusion. No central pulmonary embolism. .  Lungs/Pleura: Slightly increased size of the right middle lobe granuloma. All pulmonary nodules are similar as compared to 2020, favoring a benign etiology. For index similar 4 mm right middle lobe nodule (3/177). Similar 5 mm nodule in the superior segment of the left lower lobe. Additional similar nodules are marked in series 3.  ABDOMEN/PELVIS: .  Liver: Hepatic steatosis. .  Gallbladder/biliary: Gallstones. .  Spleen: Splenule. .  Pancreas: Within normal limits. .  Adrenals: Within normal limits. .  Kidneys/ureters: Within normal limits. .  Peritoneum/mesentery/extraperitoneum: Status post HIPEC with greater and lesser omentectomies. No ascites, peritoneal deposits or lymphadenopathy. .  GI tract: Postsurgical changes of right hemicolectomy. .  Bladder: Within normal limits. .  Reproductive system: Hysterectomy. .  Vascular: Mild atherosclerotic disease.  MSK: .  Small fat-containing supraumbilical and umbilical abdominal hernias. No aggressive osseous lesions. Unchanged T11 lucent lesion.  IMPRESSION: CONCLUSION: 1.  No evidence of metastatic disease in the chest, abdomen or pelvis. 2.  1.6 cm left thyroid lobe nodule. Recommend thyroid ultrasound for further evaluation if not already  performed. Exam End: 09/09/23 13:29  Specimen Collected: 09/09/23 16:00     Assessment: No diagnosis found.  ***  Plan:  ***  Thank you for the courtesy of this consult.  Please call me with any questions or concerns.  Charlie Pitter III  CC: Referring provider noted above

## 2023-09-14 NOTE — Progress Notes (Signed)
Virtual Visit via Video Note  I connected with Casey [name] on 09/14/23 at  3:30 PM EDT by a video enabled telemedicine application and verified that I am speaking with the correct person using two identifiers.  Location: Patient: at her home Provider: in the office    I discussed the limitations of evaluation and management by telemedicine and the availability of in person appointments. The patient expressed understanding and agreed to proceed.  History of Present Illness: 09/14/23 SS: Here today for initial CPAP study.  Saw Dr. Frances Furbish in June 2024 he reported snoring and excessive daytime somnolence, difficulty staying asleep.  Had HST 06/28/2023 showed overall mild OSA with total AHI 12.2/hour and O2 nadir 85%.  Snoring was in mild to moderate range.  Recommended CPAP treatment.CPAP download 08/14/23-09/12/23 90% usage, greater than 4 hours 87%.  Average usage days used 7 hours 38 minutes. 6-11 cm.  Leak 9.5.  AHI 1.3.  Doing overall very well on CPAP.  Is sleeping much better, sleeping through the night. In the morning, has good energy, feels restored in the morning. Lately has noted some drooling in the mask. Using FFM. She is a Pension scheme manager, has more stamina and energy at work.   05/16/23 Dr. Frances Furbish: I saw your patient, Casey [name], upon your kind request in my sleep clinic today for initial consultation of her sleep disorder, in particular, concern for underlying obstructive sleep apnea.  The patient is accompanied by her husband today.  As you know, Casey [name] is a 62 year old female with an underlying complex medical history of cancer of the appendix with status post extensive surgery, chemotherapy once, history of cholelithiasis, dysfunctional uterine bleeding, Graves' disease, migraine headaches, ovarian cyst, palpitations, pulmonary nodule,hypertension, hyperlipidemia, reflux disease, chronic diarrhea, hypothyroidism, and obesity, who reports snoring and excessive daytime  somnolence as well as difficulty sleeping at night, particularly staying asleep for years.  Her sleep difficulty dates back to 6 or 7 years ago when she had her cancer surgery.  She has not slept well since then. Her Epworth sleepiness score is 16 out of 24, fatigue severity score is 45 out of 63.  She has been on Ambien for years.  She is currently taking 10 mg each night, several years ago she tried Ambien CR.  She has occasional restless leg symptoms and needs to move her legs, sometimes gets out of bed and walks around.  She does snore with variable snoring reported per husband, sometimes she breathes so quietly that he checks on her.  She lives with her husband and currently they are 48 year old daughter moved in with her 33-year-old son.  I reviewed your office note from 03/31/2023.  She reports vivid dreams, does not always recall her dreams.  She has nocturia about once per average night, has had some morning headaches including reoccurrence of migraines lately, she used to have migraines more frequently but they improved after hysterectomy.  She does not drink any alcohol, she is a non-smoker, she drinks caffeine in limitation.  She goes to bed around 9 or 9:30 PM, in the summertime may be as late as 11 PM now that school is out.  Her rise time is generally around 7:30 AM.  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  She has 3 grown children, ages 54, 28 year old son and 53 year old daughter, they have a total of 5 grandchildren.  Sometimes she takes Tylenol at night for restless leg  symptoms, because of her extensive intestinal surgery she does not take any NSAIDs.   Her mom has sleep apnea and uses a CPAP machine.  She does fall asleep with the TV on at night, she feels that it relaxes her and distract her mind.  She does not endorse rumination of thought in the middle of the night.  He usually turns the TV off when he goes to bed.  She is  hoping to retire in a year.   Observations/Objective: Via video visit, is alert and oriented, speech is clear and concise, facial symmetry noted, moves about freely  Assessment and Plan: 1.  OSA on CPAP  -Has had very good benefit with CPAP.  Will continue nightly usage for minimum of 4 hours.  We will continue current settings.  She will follow-up in 1 year or sooner if needed.  Follow Up Instructions: 1 year my chart visit    I discussed the assessment and treatment plan with the patient. The patient was provided an opportunity to ask questions and all were answered. The patient agreed with the plan and demonstrated an understanding of the instructions.   The patient was advised to call back or seek an in-person evaluation if the symptoms worsen or if the condition fails to improve as anticipated.  Otila Kluver, DNP  [facility] [address], [address] [place], [place] [address] [phone]

## 2023-09-14 NOTE — Progress Notes (Signed)
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  (Dr. Andrey Campanile).  Had a subsequent colonoscopy by Dr. Madilyn Fireman at [facility] in August 2018 for follow-up of this.  Healthy appearing ileocolonic anastomosis seen, reportedly normal mucosa (no biopsies taken/needed).  5-year recall recommended. She is followed by the surgical service at [facility] in [place], most recently last week with note including the following:"62 yo female s/p R0R1 CRS/HIPEC on 03/29/2016 for a LGA primary. NED on imaging and physical. RTC in 2 years with imaging. " _________________________  Today, she complains of intermittent LLQ pain that tends to waxes and wanes. She tends to experience these symptoms about 2-3x a week for at least the last 6 months. We reviewed her past medical history including her history of appendix cancer and hemicolectomy. She is worried that her pain can be related to that. She also reports a history of C. Difficile infection that occurred after her first surgery.  She received treatment and later repeat testing to confirm clearance.  She also reports experiencing chronic diarrhea since her hemicolectomy (beginning almost immediately afterward). She states that her stool is watery and reports typically having 5-6x BM a day despite regularly taking Lomotil. She typically takes 2 lomotil in the morning and another in the afternoon when needed to help manage her symptoms She denies any  accompanying blood in stool or unintentional weight loss.   Patient denies any diarrhea, nausea, black stool, vomiting, abdominal pain, bloating, reflux, or dysphagia.  ROS:  Review of Systems  Constitutional:  Negative for appetite change and fever.  HENT:  Negative for trouble swallowing.   Respiratory:  Negative for cough and shortness of breath.   Cardiovascular:  Negative for chest pain.  Gastrointestinal:  Positive for abdominal pain and diarrhea. Negative for abdominal distention, anal bleeding, blood in stool, constipation, nausea, rectal pain and vomiting.  Genitourinary:  Negative for dysuria.  Musculoskeletal:  Negative for back pain.  Skin:  Negative for rash.  Neurological:  Negative for weakness.  All other systems reviewed and are negative.    Past Medical History: Past Medical History:  Diagnosis Date   Abnormal Pap smear of cervix    1985   Anemia    Anxiety    Atypical nevus 12/15/2005   Right Scapula-Slight and Right Hip-Moderate to Marked (Exc.)   Atypical nevus 06/06/2013   Left Upper Back-Moderate, and Left Shin-Mild   Atypical nevus 06/20/2018   Right Deltoid-Moderate   BCC (basal cell carcinoma of skin) 06/23/2006   Right Neck   BCC (basal cell carcinoma of skin) 06/20/2018   Right Outerback (tx p bx)   Cancer (HCC)    premelanoma per hip    Cancer of appendix (HCC)    Chronic diarrhea    COVID-19 virus infection 10/2020   Depression    DUB (dysfunctional uterine bleeding)    Dysrhythmia    pt states has PVCs    Gallstones    GERD (gastroesophageal reflux disease)    Graves disease    History of  blood transfusion    History of urinary tract infection    last one approx 4 years ago    Hyperlipidemia    Hypertension    Hypothyroidism    Insomnia    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    Migraines    Morbid obesity (HCC)    Ovarian cyst    both ovaries   Palpitations    Pneumonia    hx of had approx 22 years  ago    PONV (postoperative nausea and vomiting)    Pulmonary nodule    Sleep apnea    on CPAP   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   Left Bulb Nose ([place])     Past Surgical History: Past Surgical History:  Procedure Laterality Date   APPENDECTOMY     CRYOTHERAPY     for abnormal pap   DIAGNOSTIC LAPAROSCOPY     DILATION AND CURETTAGE OF UTERUS     infertility testing      LAPAROSCOPIC PARTIAL COLECTOMY N/A 02/02/2016   Procedure: LAPAROSCOPIC PARTIAL COlectomy right colon;  Surgeon: Gaynelle Adu, MD;  Location: [facility];  Service: General;  Laterality: N/A;   NECK SURGERY     ruptured disc 04/2021   OOPHORECTOMY     & fallopian tubes   SKIN CANCER EXCISION     2023   TONSILLECTOMY     VAGINAL HYSTERECTOMY     total   VESICOVAGINAL FISTULA CLOSURE W/ TAH     Dr. Myrlene Broker  2 degree DUB      Family History: Family History  Problem Relation Age of Onset   Cancer Mother        skin   Hypertension Mother    Irregular heart beat Mother    Myasthenia gravis Mother    Cancer Father        prostate and skin   Thyroid disease Maternal Grandmother    Cancer Maternal Grandmother        lung   Aneurysm Maternal Grandfather        stomach   Cancer Paternal Grandmother    Cancer Paternal Grandfather        lung   Heart disease Paternal Grandfather    Breast cancer Neg Hx    Colon cancer Neg Hx     Social History: Social History   Socioeconomic History   Marital status: Married    Spouse name: Not on file   Number of children: Not on file   Years of education: Not on file   Highest education level: Not on file  Occupational History   Not on file  Tobacco Use   Smoking status: Never   Smokeless tobacco: Never  Vaping Use   Vaping status: Never Used  Substance and Sexual Activity   Alcohol use: Never   Drug use: Never   Sexual activity: Yes    Partners: Male    Birth control/protection: Surgical    Comment: TVH  Other Topics Concern   Not  on file  Social History Narrative   Caffiene  48oz.   Lives with husband Brett Canales   Works at [employer].    3 children   Social Determinants of Health   Financial Resource Strain: Not on file  Food Insecurity: Not on file  Transportation Needs: Not on file  Physical Activity: Not on file  Stress: Not on file  Social Connections: Not on file  Allergies: Allergies  Allergen Reactions   Adhesive  [Tape] Itching    BANDAIDS   Benzalkonium    Buprenorphine Hcl Itching   Labetalol Itching   Morphine And Codeine Itching   Other     [place] seasoning    Wound Dressings    Zosyn [Piperacillin Sod-Tazobactam So] Rash    Pt states has had dose since and did not have difficulty, rash only on first dose , not in doses following     Outpatient Meds: Current Outpatient Medications  Medication Sig Dispense Refill   cetirizine (ZYRTEC) 10 MG tablet TAKE ONE (1) TABLET EACH DAY 90 tablet 3   cyanocobalamin (,VITAMIN B-12,) 1000 MCG/ML injection 1ml every 14 days 30 mL 1   diphenoxylate-atropine (LOMOTIL) 2.5-0.025 MG tablet 1 po qid prn 120 tablet 2   escitalopram (LEXAPRO) 10 MG tablet Take 1 tablet (10 mg total) by mouth daily. 90 tablet 1   fenofibrate 160 MG tablet TAKE ONE (1) TABLET EACH DAY 90 tablet 1   fluticasone (FLONASE) 50 MCG/ACT nasal spray Place 2 sprays into both nostrils daily. (Patient taking differently: Place 2 sprays into both nostrils daily as needed.) 16 g 6   folic acid (FOLVITE) 1 MG tablet TAKE ONE (1) TABLET EACH DAY 90 tablet 3   levothyroxine (SYNTHROID) 150 MCG tablet Take 1 tablet (150 mcg total) by mouth daily. 90 tablet 1   metoprolol succinate (TOPROL-XL) 50 MG 24 hr tablet TAKE ONE (1) TABLET EACH DAY 90 tablet 1   pantoprazole (PROTONIX) 40 MG tablet Take 1 tablet (40 mg total) by mouth daily as needed. (Needs to be seen before next refill) 90 tablet 1   promethazine (PHENERGAN) 25 MG tablet TAKE 1 TABLET EVERY 6  HOURS AS NEEDED FOR NAUSEA AND VOMITING 30 tablet 0   RESTASIS 0.05 % ophthalmic emulsion 2 drops in the morning and at bedtime.     valsartan-hydrochlorothiazide (DIOVAN-HCT) 160-12.5 MG tablet TAKE ONE (1) TABLET EACH DAY (Needs to be seen before next refill) 90 tablet 1   Vitamin D, Ergocalciferol, (DRISDOL) 1.25 MG (50000 UNIT) CAPS capsule TAKE 1 CAPSULE BY MOUTH EVERY 7 DAYS 12 capsule 1   zolpidem (AMBIEN) 10 MG tablet Take 1 tablet (10 mg total) by mouth at bedtime as needed. for sleep (Patient taking differently: Take 10 mg by mouth at bedtime. for sleep) 30 tablet 5   No current facility-administered medications for this visit.      ___________________________________________________________________ Objective   Exam:  BP 112/66   Pulse 62   Ht 5\' 10"  (1.778 m)   Wt 207 lb 12.8 oz (94.3 kg)   LMP 05/23/1995   SpO2 99%   BMI 29.82 kg/m  Wt Readings from Last 3 Encounters:  09/14/23 207 lb 12.8 oz (94.3 kg)  05/16/23 208 lb 6.4 oz (94.5 kg)  03/31/23 210 lb (95.3 kg)   General: well-appearing   Eyes: sclera anicteric, no redness ENT: oral mucosa moist without lesions, no cervical or supraclavicular lymphadenopathy CV: RRR, no JVD, no peripheral edema Resp: clear to auscultation bilaterally, normal RR and effort noted GI: soft, no tenderness, with active bowel sounds. No guarding or palpable organomegaly noted. Skin; warm and dry, no rash or jaundice noted Neuro: awake, alert and oriented x 3. Normal gross motor function and fluent speech   Labs:   Radiologic Studies:  Dennie Bible, MD - 09/09/2023 Formatting of this note might be different from the original. CT CHEST ABDOMEN PELVIS W CONTRAST,  09/09/2023 1:29 PM  INDICATION: Malignant neoplasm of appendix (CMD) \ C18.1 Malignant neoplasm of appendix (CMD) COMPARISON: CT abdomen pelvis 09/11/2021.  TECHNIQUE: Multislice axial images were obtained through the chest, abdomen, and pelvis with administration of  iodinated intravenous contrast material. Multi-planar reformatted images were generated for additional analysis. Nongated technique limits cardiac detail.  All CT scans at [facility] and [facility] are performed using radiation dose optimization techniques as appropriate to a performed exam, including but not limited to one or more of the following: automatic exposure control, adjustment of the mA and/or kV according to patient size, use of iterative reconstruction technique. In addition, our institution participates in a radiation dose monitoring program to optimize patient radiation exposure.  FINDINGS:  CHEST: .  Thoracic inlet/axilla: 1.6 cm right thyroid lobe nodule. .  Central airways: Within normal limits. .  Mediastinum/hila: Within normal limits. .  Heart/vessel: Normal size. No  pericardial effusion. No central pulmonary embolism. .  Lungs/Pleura: Slightly increased size of the right middle lobe granuloma. All pulmonary nodules are similar as compared to 2020, favoring a benign etiology. For index similar 4 mm right middle lobe nodule (3/177). Similar 5 mm nodule in the superior segment of the left lower lobe. Additional similar nodules are marked in series 3.  ABDOMEN/PELVIS: .  Liver: Hepatic steatosis. .  Gallbladder/biliary: Gallstones. .  Spleen: Splenule. .  Pancreas: Within normal limits. .  Adrenals: Within normal limits. .  Kidneys/ureters: Within normal limits. .  Peritoneum/mesentery/extraperitoneum: Status post HIPEC with greater and lesser omentectomies. No ascites, peritoneal deposits or lymphadenopathy. .  GI tract: Postsurgical changes of right hemicolectomy. .  Bladder: Within normal limits. .  Reproductive system: Hysterectomy. .  Vascular: Mild atherosclerotic disease.  MSK: .  Small fat-containing supraumbilical and umbilical abdominal hernias. No aggressive osseous lesions. Unchanged T11 lucent  lesion.  IMPRESSION: CONCLUSION: 1.  No evidence of metastatic disease in the chest, abdomen or pelvis. 2.  1.6 cm left thyroid lobe nodule. Recommend thyroid ultrasound for further evaluation if not already performed. Exam End: 09/09/23 13:29   Specimen Collected: 09/09/23 16:00     Assessment: LLQ pain  Chronic diarrhea  Months of intermittent left lower quadrant pain, mild tenderness on exam, no recent findings on CT to suggest inflammatory neoplastic or obstructive cause.  Previous history of surgery and HIPEC raise the chances of adhesions. Diarrhea may be at least partially from having had a partial colectomy, but I wonder about bile acid diarrhea.  When I mentioned cholestyramine or other bile acid binding agent, she seemed to recall that perhaps she had tried it soon after the surgery but had to stop it because it was unpalatable.  She feels she could probably try it again. SIBO also consideration.  No weight loss to suggest a malabsorptive condition. Plan:  -cholestyramine once a day in the morning.  She takes most of her medicines in the evening, and would move her dose of thyroid medicine to midday.  She knows not to take the cholestyramine within 2 hours of other medicines 2's not to affect their absorption.  -colonoscopy for further investigation of LLQ pain and diarrhea.  Will take biopsies to rule out microscopic colitis.  (Especially with prior C. difficile infection)  Thank you for the courtesy of this consult.  Please call me with any questions or concerns.   Amada Jupiter, MD    [facility]  Ladona Mow M Kadhim,acting as a scribe for Charlie Pitter  III, MD.,have documented all relevant documentation on the behalf of Sherrilyn Rist, MD,as directed by  Sherrilyn Rist, MD while in the presence of Sherrilyn Rist, MD.   Marvis Repress III, MD, have reviewed all documentation for this visit. The documentation on 09/14/23 for the exam, diagnosis, procedures, and  orders are all accurate and complete.    CC: Referring provider noted above

## 2023-09-14 NOTE — Patient Instructions (Signed)
Great to meet you today.  Keep up the great work on CPAP.  Recommend continue nightly usage for minimum of 4 hours.  Let me know if you have any issues.  I will see you back in 1 year.  Thanks!!

## 2023-09-14 NOTE — Patient Instructions (Addendum)
We have sent the following medications to your pharmacy for you to pick up at your convenience: Bretta Bang  powder   You have been scheduled for a colonoscopy. Please follow written instructions given to you at your visit today.   Please pick up your prep supplies at the pharmacy within the next 1-3 days.  If you use inhalers (even only as needed), please bring them with you on the day of your procedure.  DO NOT TAKE 7 DAYS PRIOR TO TEST- Trulicity (dulaglutide) Ozempic, Wegovy (semaglutide) Mounjaro (tirzepatide) Bydureon Bcise (exanatide extended release)  DO NOT TAKE 1 DAY PRIOR TO YOUR TEST Rybelsus (semaglutide) Adlyxin (lixisenatide) Victoza (liraglutide) Byetta (exanatide) ___________________________________________________________________________  _______________________________________________________  If your blood pressure at your visit was 140/90 or greater, please contact your primary care physician to follow up on this.  _______________________________________________________  If you are age 79 or older, your body mass index should be between 23-30. Your Body mass index is 29.82 kg/m. If this is out of the aforementioned range listed, please consider follow up with your Primary Care Provider.  If you are age 37 or younger, your body mass index should be between 19-25. Your Body mass index is 29.82 kg/m. If this is out of the aformentioned range listed, please consider follow up with your Primary Care Provider.   ________________________________________________________  The [facility] providers would like to encourage you to use [facility] to communicate with providers for non-urgent requests or questions.  Due to long hold times on the telephone, sending your provider a message by [facility] may be a faster and more efficient way to get a response.  Please allow 48 business hours for a response.  Please remember that this is for non-urgent requests.   _______________________________________________________ It was a pleasure to see you today!  Thank you for trusting me with your gastrointestinal care!

## 2023-10-05 ENCOUNTER — Telehealth: Payer: Self-pay

## 2023-10-05 NOTE — Telephone Encounter (Signed)
Attempted to reach patient concerning need to R/S procedure with Dr. Myrtie Neither;  [facility] to office;

## 2023-10-06 NOTE — Telephone Encounter (Signed)
Called and spoke with patient- patient informed of need to "push" her arrival time to 1:30pm instead of 1:00 pm, which would also push her procedure time 30 minutes from 2:00 pm to 2:30 pm; patient is agreeable to changes and reports she does NOT need updated instructions sent in My Chart as she will follow instructions and add 30 mintues to each; patient advised to not drinking fluids or take meds, chew gum, or have candy after 11:30 on day of procedure, being on 11/22; Patient verbalized understanding of information/instructions;    Patient advised to call back to the office at [phone] should questions/concerns arise;

## 2023-10-06 NOTE — Telephone Encounter (Signed)
Left message for patient to call back to the office;  

## 2023-10-06 NOTE — Telephone Encounter (Signed)
Patient is returning your call.  

## 2023-10-14 ENCOUNTER — Encounter: Payer: Self-pay | Admitting: Gastroenterology

## 2023-10-14 ENCOUNTER — Ambulatory Visit: Payer: 59 | Admitting: Gastroenterology

## 2023-10-14 VITALS — BP 156/65 | HR 55 | Temp 97.6°F | Resp 14 | Ht 70.0 in | Wt 207.0 lb

## 2023-10-14 DIAGNOSIS — K529 Noninfective gastroenteritis and colitis, unspecified: Secondary | ICD-10-CM

## 2023-10-14 DIAGNOSIS — R1032 Left lower quadrant pain: Secondary | ICD-10-CM

## 2023-10-14 DIAGNOSIS — Z980 Intestinal bypass and anastomosis status: Secondary | ICD-10-CM | POA: Diagnosis not present

## 2023-10-14 MED ORDER — SODIUM CHLORIDE 0.9 % IV SOLN: 500.0000 mL | Freq: Once | INTRAVENOUS | Status: DC

## 2023-10-14 NOTE — Progress Notes (Signed)
Sedate, gd SR, tolerated procedure well, VSS, report to RN 

## 2023-10-14 NOTE — Progress Notes (Signed)
No significant changes to clinical history since GI office visit on 09/14/23.  The patient is appropriate for an endoscopic procedure in the ambulatory setting.  - Amada Jupiter, MD

## 2023-10-14 NOTE — Op Note (Addendum)
[facility] Patient Name: Casey [name] Procedure Date: 10/14/2023 1:54 PM MRN: [id number] Endoscopist: Sherilyn Cooter L. Myrtie Neither , MD, [id number] Age: 62 Referring MD:  Date of Birth: 10/11/1961 Gender: Female Account #: [id number] Procedure:                Colonoscopy Indications:              Chronic diarrhea                           clinical details in recent office consult note                            recently started on cholestyramine Medicines:                Monitored Anesthesia Care Procedure:                Pre-Anesthesia Assessment:                           - Prior to the procedure, a History and Physical                            was performed, and patient medications and                            allergies were reviewed. The patient's tolerance of                            previous anesthesia was also reviewed. The risks                            and benefits of the procedure and the sedation                            options and risks were discussed with the patient.                            All questions were answered, and informed consent                            was obtained. Prior Anticoagulants: The patient has                            taken no anticoagulant or antiplatelet agents. ASA                            Grade Assessment: III - A patient with severe                            systemic disease. After reviewing the risks and                            benefits, the patient was deemed in satisfactory  condition to undergo the procedure.                           After obtaining informed consent, the colonoscope                            was passed under direct vision. Throughout the                            procedure, the patient's blood pressure, pulse, and                            oxygen saturations were monitored continuously. The                            Olympus Scope [id number] was introduced through the                             anus and advanced to the the ileocolonic                            anastomosis. The colonoscopy was performed without                            difficulty. The patient tolerated the procedure                            well. The quality of the bowel preparation was good                            with lavage. The rectum, neo-TI and ileo-colonic                            anastomosis were photographed. Scope In: 2:27:01 PM Scope Out: 2:48:33 PM Scope Withdrawal Time: 0 hours 18 minutes 9 seconds  Total Procedure Duration: 0 hours 21 minutes 32 seconds  Findings:                 The perianal and digital rectal examinations were                            normal.                           The neo-terminal ileum appeared normal.                           There was evidence of a prior side-to-side                            ileo-colonic anastomosis in the proximal transverse                            colon. This was patent and was characterized by  healthy appearing mucosa.                           Normal mucosa was found in the entire colon.                            Biopsies for histology were taken with a cold                            forceps from the transverse colon and sigmoid colon                            for evaluation of microscopic colitis. One of the                            transverse colon Bx sites continued to bleed,                            requiring intervention. To stop active bleeding,                            two hemostatic clips were successfully placed. Clip                            manufacturer: [employer]. There was no bleeding at                            the end of the procedure.                           The exam was otherwise without abnormality on                            direct and retroflexion views. Complications:            No immediate complications. Estimated Blood Loss:     Estimated  blood loss was minimal. Impression:               - The examined portion of the ileum was normal.                           - Patent side-to-side ileo-colonic anastomosis,                            characterized by healthy appearing mucosa.                           - Normal mucosa in the entire examined colon.                            Biopsied. Clips were placed. Clip manufacturer:                            [employer].                           -  The examination was otherwise normal on direct                            and retroflexion views. Recommendation:           - Patient has a contact number available for                            emergencies. The signs and symptoms of potential                            delayed complications were discussed with the                            patient. Return to normal activities tomorrow.                            Written discharge instructions were provided to the                            patient.                           - Resume previous diet.                           - Continue present medications.                           - Await pathology results.                           - Return to my office at appointment to be                            scheduled.                           - Repeat colonoscopy in 10 years for screening                            purposes. France Lusty L. Myrtie Neither, MD 10/14/2023 2:55:25 PM This report has been signed electronically.

## 2023-10-14 NOTE — Patient Instructions (Signed)
  Resume previous diet  Continue present medications  CLIP CARD GIVE- KEEP IN WALLET   Repeat colonoscopy in 10 years for screening purposes.  Awaiting pathology results   YOU HAD AN ENDOSCOPIC PROCEDURE TODAY AT THE [facility]:   Refer to the procedure report that was given to you for any specific questions about what was found during the examination.  If the procedure report does not answer your questions, please call your gastroenterologist to clarify.  If you requested that your care partner not be given the details of your procedure findings, then the procedure report has been included in a sealed envelope for you to review at your convenience later.  YOU SHOULD EXPECT: Some feelings of bloating in the abdomen. Passage of more gas than usual.  Walking can help get rid of the air that was put into your GI tract during the procedure and reduce the bloating. If you had a lower endoscopy (such as a colonoscopy or flexible sigmoidoscopy) you may notice spotting of blood in your stool or on the toilet paper. If you underwent a bowel prep for your procedure, you may not have a normal bowel movement for a few days.  Please Note:  You might notice some irritation and congestion in your nose or some drainage.  This is from the oxygen used during your procedure.  There is no need for concern and it should clear up in a day or so.  SYMPTOMS TO REPORT IMMEDIATELY:  Following lower endoscopy (colonoscopy or flexible sigmoidoscopy):  Excessive amounts of blood in the stool  Significant tenderness or worsening of abdominal pains  Swelling of the abdomen that is new, acute  Fever of 100F or higher  For urgent or emergent issues, a gastroenterologist can be reached at any hour by calling [phone]. Do not use MyChart messaging for urgent concerns.    DIET:  We do recommend a small meal at first, but then you may proceed to your regular diet.  Drink plenty of fluids but you should  avoid alcoholic beverages for 24 hours.  ACTIVITY:  You should plan to take it easy for the rest of today and you should NOT DRIVE or use heavy machinery until tomorrow (because of the sedation medicines used during the test).    FOLLOW UP: Our staff will call the number listed on your records the next business day following your procedure.  We will call around 7:15- 8:00 am to check on you and address any questions or concerns that you may have regarding the information given to you following your procedure. If we do not reach you, we will leave a message.     If any biopsies were taken you will be contacted by phone or by letter within the next 1-3 weeks.  Please call us at [phone] if you have not heard about the biopsies in 3 weeks.    SIGNATURES/CONFIDENTIALITY: You and/or your care partner have signed paperwork which will be entered into your electronic medical record.  These signatures attest to the fact that that the information above on your After Visit Summary has been reviewed and is understood.  Full responsibility of the confidentiality of this discharge information lies with you and/or your care-partner.

## 2023-10-14 NOTE — Progress Notes (Signed)
Called to room to assist during endoscopic procedure.  Patient ID and intended procedure confirmed with present staff. Received instructions for my participation in the procedure from the performing physician.  

## 2023-10-14 NOTE — Progress Notes (Signed)
Vitals-JF  Pt's states no medical or surgical changes since previsit or office visit.

## 2023-10-17 ENCOUNTER — Telehealth: Payer: Self-pay

## 2023-10-17 NOTE — Telephone Encounter (Signed)
  Follow up Call-     10/14/2023    1:33 PM  Call back number  Post procedure Call Back phone  # [phone]  Permission to leave phone message Yes     Patient questions:  Do you have a fever, pain , or abdominal swelling? Abdomen still tender but each day is better Pain Score  0 *  Have you tolerated food without any problems? Yes.    Have you been able to return to your normal activities? No.  Do you have any questions about your discharge instructions: Diet   No. Medications  No. Follow up visit  No.  Do you have questions or concerns about your Care? No.  Actions: * If pain score is 4 or above: No action needed, pain <4.

## 2023-10-19 LAB — SURGICAL PATHOLOGY

## 2023-10-20 ENCOUNTER — Other Ambulatory Visit: Payer: Self-pay | Admitting: Nurse Practitioner

## 2023-10-20 DIAGNOSIS — I10 Essential (primary) hypertension: Secondary | ICD-10-CM

## 2023-10-20 DIAGNOSIS — E7800 Pure hypercholesterolemia, unspecified: Secondary | ICD-10-CM

## 2023-10-26 ENCOUNTER — Telehealth: Payer: Self-pay | Admitting: Gastroenterology

## 2023-10-26 NOTE — Telephone Encounter (Signed)
Inbound call from patient returning phone call regarding recent procedure results. Requesting a call back. Please advise, thank you.

## 2023-10-26 NOTE — Telephone Encounter (Signed)
Pt made aware of recent results. Pt stated that she will call in and make a follow up appointment.  Pt verbalized understanding with all questions answered.

## 2023-11-12 ENCOUNTER — Other Ambulatory Visit: Payer: Self-pay | Admitting: Nurse Practitioner

## 2023-11-12 DIAGNOSIS — F5101 Primary insomnia: Secondary | ICD-10-CM

## 2023-11-23 ENCOUNTER — Other Ambulatory Visit: Payer: Self-pay | Admitting: Nurse Practitioner

## 2023-11-23 DIAGNOSIS — F3342 Major depressive disorder, recurrent, in full remission: Secondary | ICD-10-CM

## 2023-11-24 ENCOUNTER — Other Ambulatory Visit: Payer: Self-pay | Admitting: Nurse Practitioner

## 2023-11-24 DIAGNOSIS — E7800 Pure hypercholesterolemia, unspecified: Secondary | ICD-10-CM

## 2023-11-29 ENCOUNTER — Other Ambulatory Visit: Payer: Self-pay | Admitting: Nurse Practitioner

## 2023-11-29 DIAGNOSIS — K529 Noninfective gastroenteritis and colitis, unspecified: Secondary | ICD-10-CM

## 2023-11-29 DIAGNOSIS — I10 Essential (primary) hypertension: Secondary | ICD-10-CM

## 2023-11-29 MED ORDER — DIPHENOXYLATE-ATROPINE 2.5-0.025 MG PO TABS: ORAL_TABLET | ORAL | 2 refills | Status: DC

## 2023-11-29 MED ORDER — VALSARTAN-HYDROCHLOROTHIAZIDE 160-12.5 MG PO TABS: 1.0000 | ORAL_TABLET | Freq: Every day | ORAL | 0 refills | Status: DC

## 2023-11-29 NOTE — Addendum Note (Signed)
 Addended by: Bennie Pierini on: 11/29/2023 04:35 PM   Modules accepted: Orders

## 2023-11-29 NOTE — Telephone Encounter (Signed)
 Meds ordered this encounter  Medications   DISCONTD: valsartan -hydrochlorothiazide  (DIOVAN -HCT) 160-12.5 MG tablet    Sig: TAKE ONE (1) TABLET BY MOUTH EVERY DAY    Dispense:  30 tablet    Refill:  0   valsartan -hydrochlorothiazide  (DIOVAN -HCT) 160-12.5 MG tablet    Sig: Take 1 tablet by mouth daily.    Dispense:  90 tablet    Refill:  0    Supervising Provider:   DETTINGER, JOSHUA A [[id number]]   diphenoxylate -atropine  (LOMOTIL ) 2.5-0.025 MG tablet    Sig: 1 po qid prn    Dispense:  120 tablet    Refill:  2    Supervising Provider:   MARYANNE CHEW A [[id number]]   Casey Gladis, FNP

## 2023-12-09 ENCOUNTER — Ambulatory Visit: Payer: 59 | Admitting: Nurse Practitioner

## 2023-12-09 NOTE — Progress Notes (Deleted)
Subjective:    Patient ID: Casey [name], female    DOB: 1961-05-01, 63 y.o.   MRN: [id number]   Chief Complaint: medical management of chronic issues     HPI:  Casey [name] is a 63 y.o. who identifies as a female who was assigned female at birth.   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 Primary hypertension No c/o chest pain, sob or headache. Doe snot check blood pressure at home. BP Readings from Last 3 Encounters:  10/14/23 (!) 156/65  09/14/23 112/66  05/16/23 137/67     2. Pure hypercholesterolemia Does try to watch diet but does no dedicated exercise. 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 Has had no reoccurrence.  4. Chronic diarrhea Has had chronic diarrhea since she had her cancer treatement in 2017.  5. Gastroesophageal reflux disease without esophagitis Is on protionix daily  6. Acquired hypothyroidism No issues that she is aware of. We increased her synthroid to daily. Lab Results  Component Value Date   TSH 1.960 03/31/2023     7. Recurrent major depressive disorder, in full remission (HCC) Is on lexapro daily and has been doing well. *** 8. Primary insomnia Is on ambien to sleep at night  9. Morbid obesity (HCC) No recent weight changes  ***   New complaints: None today  Allergies  Allergen Reactions   Adhesive  [Tape] Itching    BANDAIDS   Benzalkonium    Buprenorphine Hcl Itching   Labetalol Itching   Morphine And Codeine Itching   Other     [place] seasoning    Wound Dressings    Zosyn [Piperacillin Sod-Tazobactam So] Rash    Pt states has  had dose since and did not have difficulty, rash only on first dose , not in doses following    Outpatient Encounter Medications as of 12/09/2023  Medication Sig   cetirizine (ZYRTEC) 10 MG tablet TAKE ONE (1) TABLET EACH DAY   cholestyramine (QUESTRAN) 4 g packet Take 1 packet (4 g total) by mouth daily.   cyanocobalamin (,VITAMIN B-12,) 1000 MCG/ML injection 1ml every 14 days   diphenoxylate-atropine (LOMOTIL) 2.5-0.025 MG tablet 1 po qid prn   escitalopram (LEXAPRO) 10 MG tablet Take 1 tablet (10 mg total) by mouth daily. **NEEDS TO BE SEEN BEFORE NEXT REFILL**   fenofibrate 160 MG tablet TAKE ONE (1) TABLET BY MOUTH EVERY DAY **NEEDS TO BE SEEN BEFORE NEXT REFILL**   fluticasone (FLONASE) 50 MCG/ACT nasal spray Place 2 sprays into both nostrils daily. (Patient taking differently: Place 2 sprays into both nostrils daily as needed.)   folic acid (FOLVITE) 1 MG tablet TAKE ONE (1) TABLET EACH DAY   levothyroxine (SYNTHROID) 150 MCG tablet Take 1 tablet (150 mcg total) by mouth daily.   metoprolol succinate (TOPROL-XL) 50 MG 24 hr tablet TAKE ONE (1) TABLET EACH DAY   pantoprazole (PROTONIX) 40 MG tablet Take 1 tablet (40 mg total) by mouth daily as needed. (Needs to be seen before next refill)   promethazine (PHENERGAN) 25 MG tablet  TAKE 1 TABLET EVERY 6 HOURS AS NEEDED FOR NAUSEA AND VOMITING   RESTASIS 0.05 % ophthalmic emulsion 2 drops in the morning and at bedtime.   valsartan-hydrochlorothiazide (DIOVAN-HCT) 160-12.5 MG tablet Take 1 tablet by mouth daily.   Vitamin D, Ergocalciferol, (DRISDOL) 1.25 MG (50000 UNIT) CAPS capsule Take 1 capsule (50,000 Units total) by mouth every 7 (seven) days. **NEEDS TO BE SEEN BEFORE NEXT REFILL**   zolpidem (AMBIEN) 10 MG tablet Take 1 tablet (10 mg total) by mouth at bedtime as needed. for sleep (Patient taking differently: Take 10 mg by mouth at bedtime. for sleep)   No facility-administered encounter medications on file as of 12/09/2023.    Past  Surgical History:  Procedure Laterality Date   APPENDECTOMY     CRYOTHERAPY     for abnormal pap   DIAGNOSTIC LAPAROSCOPY     DILATION AND CURETTAGE OF UTERUS     infertility testing      LAPAROSCOPIC PARTIAL COLECTOMY N/A 02/02/2016   Procedure: LAPAROSCOPIC PARTIAL COlectomy right colon;  Surgeon: Gaynelle Adu, MD;  Location: [facility];  Service: General;  Laterality: N/A;   NECK SURGERY     ruptured disc 04/2021   OOPHORECTOMY     & fallopian tubes   SKIN CANCER EXCISION     2023   TONSILLECTOMY     VAGINAL HYSTERECTOMY     total   VESICOVAGINAL FISTULA CLOSURE W/ TAH     Dr. Myrlene Broker  2 degree DUB     Family History  Problem Relation Age of Onset   Cancer Mother        skin   Hypertension Mother    Irregular heart beat Mother    Myasthenia gravis Mother    Cancer Father        prostate and skin   Thyroid disease Maternal Grandmother    Cancer Maternal Grandmother        lung   Aneurysm Maternal Grandfather        stomach   Cancer Paternal Grandmother    Cancer Paternal Grandfather        lung   Heart disease Paternal Grandfather    Breast cancer Neg Hx    Colon cancer Neg Hx       Controlled substance contract: n/a     Review of Systems  Constitutional:  Negative for diaphoresis.  Eyes:  Negative for pain.  Respiratory:  Negative for shortness of breath.   Cardiovascular:  Negative for chest pain, palpitations and leg swelling.  Gastrointestinal:  Negative for abdominal pain.  Endocrine: Negative for polydipsia.  Skin:  Negative for rash.  Neurological:  Negative for dizziness, weakness and headaches.  Hematological:  Does not bruise/bleed easily.  All other systems reviewed and are negative.      Objective:   Physical Exam Vitals reviewed.  Constitutional:      General: She is not in acute distress.    Appearance: Normal appearance. She is well-developed.  HENT:     Head: Normocephalic.     Right Ear: Tympanic membrane normal.     Left Ear:  Tympanic membrane normal.     Nose: Nose normal.     Mouth/Throat:     Mouth: Mucous membranes are moist.  Eyes:     Pupils: Pupils are equal, round, and reactive to light.  Neck:     Vascular: No carotid bruit or JVD.  Cardiovascular:     Rate and Rhythm: Normal rate and regular rhythm.  Heart sounds: Normal heart sounds.  Pulmonary:     Effort: Pulmonary effort is normal. No respiratory distress.     Breath sounds: Normal breath sounds. No wheezing or rales.  Chest:     Chest wall: No tenderness.  Abdominal:     General: Bowel sounds are normal. There is no distension or abdominal bruit.     Palpations: Abdomen is soft. There is no hepatomegaly, splenomegaly, mass or pulsatile mass.     Tenderness: There is no abdominal tenderness.  Musculoskeletal:        General: Normal range of motion.     Cervical back: Normal range of motion and neck supple.  Lymphadenopathy:     Cervical: No cervical adenopathy.  Skin:    General: Skin is warm and dry.  Neurological:     Mental Status: She is alert and oriented to person, place, and time.     Deep Tendon Reflexes: Reflexes are normal and symmetric.  Psychiatric:        Behavior: Behavior normal.        Thought Content: Thought content normal.        Judgment: Judgment normal.    LMP 05/23/1995         Assessment & Plan:  JASMONIQUE JUNIOR comes in today with chief complaint of No chief complaint on file.   Diagnosis and orders addressed:  1. Primary hypertension Low sodium diet - CBC with Differential/Platelet - CMP14+EGFR - metoprolol succinate (TOPROL-XL) 50 MG 24 hr tablet; Take with or immediately following a meal.  Dispense: 90 tablet; Refill: 0 - valsartan-hydrochlorothiazide (DIOVAN-HCT) 160-12.5 MG tablet; TAKE ONE (1) TABLET EACH DAY (Needs to be seen before next refill)  Dispense: 90 tablet; Refill: 1  2. 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 Chronic diarrhea - diphenoxylate-atropine (LOMOTIL) 2.5-0.025 MG tablet; 1 po qid prn  Dispense: 120 tablet; Refill: 2  5. Gastroesophageal reflux disease without esophagitis Avoid spicy foods Do not eat 2 hours prior to bedtime - pantoprazole (PROTONIX) 40 MG tablet; Take 1 tablet (40 mg total) by mouth daily as needed. (Needs to be seen before next refill)  Dispense: 90 tablet; Refill: 1  6. Acquired hypothyroidism Labs pending - Thyroid Panel With TSH  7. Recurrent major depressive disorder, in full remission (HCC) Stress management - escitalopram (LEXAPRO) 10 MG tablet; Take 1 tablet (10 mg total) by mouth daily.  Dispense: 90 tablet; Refill: 1  8. Primary insomnia Bedtime routine - zolpidem (AMBIEN) 10 MG tablet; Take 1 tablet (10 mg total) by mouth at bedtime as needed. for sleep  Dispense: 30 tablet; Refill: 5  9. Morbid obesity (HCC) Discussed diet and exercise for person with BMI >25 Will recheck weight in 3-6 months    Labs pending Health Maintenance reviewed Diet and exercise encouraged  Follow up plan: 6 months   [name] Daphine Deutscher, FNP

## 2023-12-12 ENCOUNTER — Encounter: Payer: Self-pay | Admitting: Nurse Practitioner

## 2023-12-13 ENCOUNTER — Ambulatory Visit: Payer: 59 | Admitting: Nurse Practitioner

## 2023-12-13 VITALS — BP 110/71 | HR 55 | Temp 97.8°F | Ht 70.0 in | Wt 210.0 lb

## 2023-12-13 DIAGNOSIS — Z23 Encounter for immunization: Secondary | ICD-10-CM | POA: Diagnosis not present

## 2023-12-13 DIAGNOSIS — D373 Neoplasm of uncertain behavior of appendix: Secondary | ICD-10-CM

## 2023-12-13 DIAGNOSIS — G4733 Obstructive sleep apnea (adult) (pediatric): Secondary | ICD-10-CM

## 2023-12-13 DIAGNOSIS — K529 Noninfective gastroenteritis and colitis, unspecified: Secondary | ICD-10-CM | POA: Diagnosis not present

## 2023-12-13 DIAGNOSIS — I10 Essential (primary) hypertension: Secondary | ICD-10-CM

## 2023-12-13 DIAGNOSIS — E7800 Pure hypercholesterolemia, unspecified: Secondary | ICD-10-CM | POA: Diagnosis not present

## 2023-12-13 DIAGNOSIS — Z6829 Body mass index (BMI) 29.0-29.9, adult: Secondary | ICD-10-CM

## 2023-12-13 DIAGNOSIS — K219 Gastro-esophageal reflux disease without esophagitis: Secondary | ICD-10-CM

## 2023-12-13 DIAGNOSIS — F3342 Major depressive disorder, recurrent, in full remission: Secondary | ICD-10-CM

## 2023-12-13 DIAGNOSIS — F5101 Primary insomnia: Secondary | ICD-10-CM

## 2023-12-13 DIAGNOSIS — E039 Hypothyroidism, unspecified: Secondary | ICD-10-CM

## 2023-12-13 LAB — LIPID PANEL

## 2023-12-13 MED ORDER — FENOFIBRATE 160 MG PO TABS: ORAL_TABLET | ORAL | 1 refills | Status: DC

## 2023-12-13 MED ORDER — PANTOPRAZOLE SODIUM 40 MG PO TBEC: 40.0000 mg | DELAYED_RELEASE_TABLET | Freq: Every day | ORAL | 1 refills | Status: DC | PRN

## 2023-12-13 MED ORDER — LEVOTHYROXINE SODIUM 150 MCG PO TABS: 150.0000 ug | ORAL_TABLET | Freq: Every day | ORAL | 1 refills | Status: DC

## 2023-12-13 MED ORDER — METOPROLOL SUCCINATE ER 50 MG PO TB24: ORAL_TABLET | ORAL | 1 refills | Status: DC

## 2023-12-13 MED ORDER — ZOLPIDEM TARTRATE 10 MG PO TABS: 10.0000 mg | ORAL_TABLET | Freq: Every evening | ORAL | 5 refills | Status: DC | PRN

## 2023-12-13 MED ORDER — VALSARTAN-HYDROCHLOROTHIAZIDE 160-12.5 MG PO TABS: 1.0000 | ORAL_TABLET | Freq: Every day | ORAL | 1 refills | Status: DC

## 2023-12-13 MED ORDER — DIPHENOXYLATE-ATROPINE 2.5-0.025 MG PO TABS: ORAL_TABLET | ORAL | 2 refills | Status: DC

## 2023-12-13 MED ORDER — ESCITALOPRAM OXALATE 10 MG PO TABS: 10.0000 mg | ORAL_TABLET | Freq: Every day | ORAL | 1 refills | Status: DC

## 2023-12-13 NOTE — Progress Notes (Signed)
Subjective:    Patient ID: Casey [name], female    DOB: Feb 03, 1961, 63 y.o.   MRN: [id number]   Chief Complaint: medical management of chronic issues     HPI:  Casey [name] is a 63 y.o. who identifies as a female who was assigned female at birth.   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 Primary hypertension No c/o chest pain, sob or headache. Doe snot check blood pressure at home. BP Readings from Last 3 Encounters:  10/14/23 (!) 156/65  09/14/23 112/66  05/16/23 137/67     2. Pure hypercholesterolemia Does try to watch diet but does no dedicated exercise. 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 Has had no reoccurrence.  4. Chronic diarrhea Has had chronic diarrhea since she had her cancer treatement in 2017.  5. Gastroesophageal reflux disease without esophagitis Is on protionix daily  6. Acquired hypothyroidism No issues that she is aware of. We increased her synthroid to daily. Lab Results  Component Value Date   TSH 1.960 03/31/2023     7. Recurrent major depressive disorder, in full remission (HCC) Is on lexapro daily and has been doing well.    12/13/2023    3:32 PM 03/31/2023    3:46 PM 10/11/2022    4:09 PM  Depression screen PHQ 2/9  Decreased Interest 0 0 1  Down, Depressed, Hopeless 1 0 0  PHQ - 2 Score 1 0 1  Altered sleeping 1 2 0  Tired, decreased energy 0 2 1  Change in appetite 1 2 0  Feeling bad or failure about yourself  0 0 0  Trouble concentrating 0 1 0  Moving slowly or fidgety/restless 0 2 0  Suicidal thoughts 0 0 0  PHQ-9 Score 3 9 2   Difficult doing  work/chores Not difficult at all Somewhat difficult Not difficult at all     8. Primary insomnia Is on ambien to sleep at night  9. OSA on CPAP Wears CPAP  nightly  10. BMI 29.0-29.9 (HCC) No recent weight changes Wt Readings from Last 3 Encounters:  10/14/23 207 lb (93.9 kg)  09/14/23 207 lb 12.8 oz (94.3 kg)  05/16/23 208 lb 6.4 oz (94.5 kg)   BMI Readings from Last 3 Encounters:  10/14/23 29.70 kg/m  09/14/23 29.82 kg/m  05/16/23 29.48 kg/m     New complaints: None today  Allergies  Allergen Reactions   Adhesive  [Tape] Itching    BANDAIDS   Benzalkonium    Buprenorphine Hcl Itching   Labetalol Itching   Morphine And Codeine Itching   Other     [place] seasoning    Wound Dressings    Zosyn [Piperacillin Sod-Tazobactam So] Rash    Pt states has had dose since and did not have difficulty, rash only on first dose , not in doses following    Outpatient Encounter Medications as of 12/13/2023  Medication Sig   cetirizine (ZYRTEC) 10 MG tablet TAKE ONE (1) TABLET EACH DAY   cholestyramine (QUESTRAN) 4 g packet Take 1 packet (  4 g total) by mouth daily.   cyanocobalamin (,VITAMIN B-12,) 1000 MCG/ML injection 1ml every 14 days   diphenoxylate-atropine (LOMOTIL) 2.5-0.025 MG tablet 1 po qid prn   escitalopram (LEXAPRO) 10 MG tablet Take 1 tablet (10 mg total) by mouth daily. **NEEDS TO BE SEEN BEFORE NEXT REFILL**   fenofibrate 160 MG tablet TAKE ONE (1) TABLET BY MOUTH EVERY DAY **NEEDS TO BE SEEN BEFORE NEXT REFILL**   fluticasone (FLONASE) 50 MCG/ACT nasal spray Place 2 sprays into both nostrils daily. (Patient taking differently: Place 2 sprays into both nostrils daily as needed.)   folic acid (FOLVITE) 1 MG tablet TAKE ONE (1) TABLET EACH DAY   levothyroxine (SYNTHROID) 150 MCG tablet Take 1 tablet (150 mcg total) by mouth daily.   metoprolol succinate (TOPROL-XL) 50 MG 24 hr tablet TAKE ONE (1) TABLET EACH DAY   pantoprazole (PROTONIX) 40 MG tablet Take 1  tablet (40 mg total) by mouth daily as needed. (Needs to be seen before next refill)   promethazine (PHENERGAN) 25 MG tablet TAKE 1 TABLET EVERY 6 HOURS AS NEEDED FOR NAUSEA AND VOMITING   RESTASIS 0.05 % ophthalmic emulsion 2 drops in the morning and at bedtime.   valsartan-hydrochlorothiazide (DIOVAN-HCT) 160-12.5 MG tablet Take 1 tablet by mouth daily.   Vitamin D, Ergocalciferol, (DRISDOL) 1.25 MG (50000 UNIT) CAPS capsule Take 1 capsule (50,000 Units total) by mouth every 7 (seven) days. **NEEDS TO BE SEEN BEFORE NEXT REFILL**   zolpidem (AMBIEN) 10 MG tablet Take 1 tablet (10 mg total) by mouth at bedtime as needed. for sleep (Patient taking differently: Take 10 mg by mouth at bedtime. for sleep)   No facility-administered encounter medications on file as of 12/13/2023.    Past Surgical History:  Procedure Laterality Date   APPENDECTOMY     CRYOTHERAPY     for abnormal pap   DIAGNOSTIC LAPAROSCOPY     DILATION AND CURETTAGE OF UTERUS     infertility testing      LAPAROSCOPIC PARTIAL COLECTOMY N/A 02/02/2016   Procedure: LAPAROSCOPIC PARTIAL COlectomy right colon;  Surgeon: Gaynelle Adu, MD;  Location: [facility];  Service: General;  Laterality: N/A;   NECK SURGERY     ruptured disc 04/2021   OOPHORECTOMY     & fallopian tubes   SKIN CANCER EXCISION     2023   TONSILLECTOMY     VAGINAL HYSTERECTOMY     total   VESICOVAGINAL FISTULA CLOSURE W/ TAH     Dr. Myrlene Broker  2 degree DUB     Family History  Problem Relation Age of Onset   Cancer Mother        skin   Hypertension Mother    Irregular heart beat Mother    Myasthenia gravis Mother    Cancer Father        prostate and skin   Thyroid disease Maternal Grandmother    Cancer Maternal Grandmother        lung   Aneurysm Maternal Grandfather        stomach   Cancer Paternal Grandmother    Cancer Paternal Grandfather        lung   Heart disease Paternal Grandfather    Breast cancer Neg Hx    Colon cancer Neg Hx        Controlled substance contract: n/a     Review of Systems  Constitutional:  Negative for diaphoresis.  Eyes:  Negative for pain.  Respiratory:  Negative for shortness of breath.  Cardiovascular:  Negative for chest pain, palpitations and leg swelling.  Gastrointestinal:  Negative for abdominal pain.  Endocrine: Negative for polydipsia.  Skin:  Negative for rash.  Neurological:  Negative for dizziness, weakness and headaches.  Hematological:  Does not bruise/bleed easily.  All other systems reviewed and are negative.      Objective:   Physical Exam Vitals reviewed.  Constitutional:      General: She is not in acute distress.    Appearance: Normal appearance. She is well-developed.  HENT:     Head: Normocephalic.     Right Ear: Tympanic membrane normal.     Left Ear: Tympanic membrane normal.     Nose: Nose normal.     Mouth/Throat:     Mouth: Mucous membranes are moist.  Eyes:     Pupils: Pupils are equal, round, and reactive to light.  Neck:     Vascular: No carotid bruit or JVD.  Cardiovascular:     Rate and Rhythm: Normal rate and regular rhythm.     Heart sounds: Normal heart sounds.  Pulmonary:     Effort: Pulmonary effort is normal. No respiratory distress.     Breath sounds: Normal breath sounds. No wheezing or rales.  Chest:     Chest wall: No tenderness.  Abdominal:     General: Bowel sounds are normal. There is no distension or abdominal bruit.     Palpations: Abdomen is soft. There is no hepatomegaly, splenomegaly, mass or pulsatile mass.     Tenderness: There is no abdominal tenderness.  Musculoskeletal:        General: Normal range of motion.     Cervical back: Normal range of motion and neck supple.  Lymphadenopathy:     Cervical: No cervical adenopathy.  Skin:    General: Skin is warm and dry.  Neurological:     Mental Status: She is alert and oriented to person, place, and time.     Deep Tendon Reflexes: Reflexes are normal and  symmetric.  Psychiatric:        Behavior: Behavior normal.        Thought Content: Thought content normal.        Judgment: Judgment normal.    LMP 05/23/1995   BP 110/71   Pulse (!) 55   Temp 97.8 F (36.6 C) (Temporal)   Ht 5\' 10"  (1.778 m)   Wt 210 lb (95.3 kg)   LMP 05/23/1995   SpO2 96%   BMI 30.13 kg/m        Assessment & Plan:  CORLENE WISECARVER comes in today with chief complaint of No chief complaint on file.   Diagnosis and orders addressed:  1. Primary hypertension Low sodium diet - CBC with Differential/Platelet - CMP14+EGFR - metoprolol succinate (TOPROL-XL) 50 MG 24 hr tablet; Take with or immediately following a meal.  Dispense: 90 tablet; Refill: 0 - valsartan-hydrochlorothiazide (DIOVAN-HCT) 160-12.5 MG tablet; TAKE ONE (1) TABLET EACH DAY (Needs to be seen before next refill)  Dispense: 90 tablet; Refill: 1  2. 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 Chronic diarrhea - diphenoxylate-atropine (LOMOTIL) 2.5-0.025 MG tablet; 1 po qid prn  Dispense: 120 tablet; Refill: 2  5. Gastroesophageal reflux disease without esophagitis Avoid spicy foods Do not eat 2 hours prior to bedtime - pantoprazole (  PROTONIX) 40 MG tablet; Take 1 tablet (40 mg total) by mouth daily as needed. (Needs to be seen before next refill)  Dispense: 90 tablet; Refill: 1  6. Acquired hypothyroidism Labs pending - Thyroid Panel With TSH  7. Recurrent major depressive disorder, in full remission (HCC) Stress management - escitalopram (LEXAPRO) 10 MG tablet; Take 1 tablet (10 mg total) by mouth daily.  Dispense: 90 tablet; Refill: 1  8. Primary insomnia Bedtime routine - zolpidem (AMBIEN) 10 MG tablet; Take 1 tablet (10 mg total) by mouth at bedtime as needed. for sleep  Dispense: 30 tablet; Refill:  5  9. Morbid obesity (HCC) Discussed diet and exercise for person with BMI >25 Will recheck weight in 3-6 months    Labs pending Health Maintenance reviewed Diet and exercise encouraged  Follow up plan: 6 months   [name] Daphine Deutscher, FNP

## 2023-12-13 NOTE — Addendum Note (Signed)
Addended by: Bennie Pierini on: 12/13/2023 03:59 PM   Modules accepted: Level of Service

## 2023-12-14 LAB — CMP14+EGFR
ALT: 17 [IU]/L (ref 0–32)
AST: 19 [IU]/L (ref 0–40)
Albumin: 4.2 g/dL (ref 3.9–4.9)
Alkaline Phosphatase: 54 [IU]/L (ref 44–121)
BUN/Creatinine Ratio: 17 (ref 12–28)
BUN: 21 mg/dL (ref 8–27)
Bilirubin Total: <0.2 mg/dL (ref 0.0–1.2)
CO2: 27 mmol/L (ref 20–29)
Calcium: 9.7 mg/dL (ref 8.7–10.3)
Chloride: 101 mmol/L (ref 96–106)
Creatinine, Ser: 1.22 mg/dL — ABNORMAL HIGH (ref 0.57–1.00)
Globulin, Total: 2.2 g/dL (ref 1.5–4.5)
Glucose: 114 mg/dL — ABNORMAL HIGH (ref 70–99)
Potassium: 3.7 mmol/L (ref 3.5–5.2)
Sodium: 142 mmol/L (ref 134–144)
Total Protein: 6.4 g/dL (ref 6.0–8.5)
eGFR: 50 mL/min/{1.73_m2} — ABNORMAL LOW

## 2023-12-14 LAB — CBC WITH DIFFERENTIAL/PLATELET
Basophils Absolute: 0.1 10*3/uL (ref 0.0–0.2)
Basos: 1 %
EOS (ABSOLUTE): 0.2 10*3/uL (ref 0.0–0.4)
Eos: 3 %
Hematocrit: 37 % (ref 34.0–46.6)
Hemoglobin: 12.4 g/dL (ref 11.1–15.9)
Immature Grans (Abs): 0 10*3/uL (ref 0.0–0.1)
Immature Granulocytes: 0 %
Lymphocytes Absolute: 3.1 10*3/uL (ref 0.7–3.1)
Lymphs: 38 %
MCH: 29.6 pg (ref 26.6–33.0)
MCHC: 33.5 g/dL (ref 31.5–35.7)
MCV: 88 fL (ref 79–97)
Monocytes Absolute: 0.8 10*3/uL (ref 0.1–0.9)
Monocytes: 9 %
Neutrophils Absolute: 4.1 10*3/uL (ref 1.4–7.0)
Neutrophils: 49 %
Platelets: 225 10*3/uL (ref 150–450)
RBC: 4.19 x10E6/uL (ref 3.77–5.28)
RDW: 12.2 % (ref 11.7–15.4)
WBC: 8.3 10*3/uL (ref 3.4–10.8)

## 2023-12-14 LAB — LIPID PANEL
Chol/HDL Ratio: 2.5 ratio (ref 0.0–4.4)
Cholesterol, Total: 140 mg/dL (ref 100–199)
HDL: 57 mg/dL
LDL Chol Calc (NIH): 65 mg/dL (ref 0–99)
Triglycerides: 96 mg/dL (ref 0–149)
VLDL Cholesterol Cal: 18 mg/dL (ref 5–40)

## 2024-01-04 ENCOUNTER — Other Ambulatory Visit: Payer: Self-pay | Admitting: Nurse Practitioner

## 2024-01-05 ENCOUNTER — Telehealth: Payer: 59 | Admitting: Nurse Practitioner

## 2024-01-05 ENCOUNTER — Telehealth: Payer: 59 | Admitting: Physician Assistant

## 2024-01-05 DIAGNOSIS — J029 Acute pharyngitis, unspecified: Secondary | ICD-10-CM

## 2024-01-05 DIAGNOSIS — J0140 Acute pansinusitis, unspecified: Secondary | ICD-10-CM

## 2024-01-05 MED ORDER — ONDANSETRON 4 MG PO TBDP: 4.0000 mg | ORAL_TABLET | Freq: Three times a day (TID) | ORAL | 0 refills | Status: AC | PRN

## 2024-01-05 MED ORDER — AMOXICILLIN-POT CLAVULANATE 875-125 MG PO TABS: 1.0000 | ORAL_TABLET | Freq: Two times a day (BID) | ORAL | 0 refills | Status: DC

## 2024-01-05 NOTE — Progress Notes (Signed)
   Thank you for the details you included in the comment boxes. Those details are very helpful in determining the best course of treatment for you and help [place] to provide the best care.Because of substantial symptoms and need to make sure you get properly treated, we recommend that you schedule a Virtual Urgent Care video visit in order for the provider to better assess what is going on.  The provider will be able to give you a more accurate diagnosis and treatment plan if we can more freely discuss your symptoms and with the addition of a virtual examination.   If you change your visit to a video visit, we will bill your insurance (similar to an office visit) and you will not be charged for this e-Visit. You will be able to stay at home and speak with the first available [facility] advanced practice provider. The link to do a video visit is in the drop down Menu tab of your Welcome screen in MyChart.

## 2024-01-05 NOTE — Progress Notes (Signed)
Virtual Visit Consent   Casey [name], you are scheduled for a virtual visit with a [facility] provider today. Just as with appointments in the office, your consent must be obtained to participate. Your consent will be active for this visit and any virtual visit you may have with one of our providers in the next 365 days. If you have a MyChart account, a copy of this consent can be sent to you electronically.  As this is a virtual visit, video technology does not allow for your provider to perform a traditional examination. This may limit your provider's ability to fully assess your condition. If your provider identifies any concerns that need to be evaluated in person or the need to arrange testing (such as labs, EKG, etc.), we will make arrangements to do so. Although advances in technology are sophisticated, we cannot ensure that it will always work on either your end or our end. If the connection with a video visit is poor, the visit may have to be switched to a telephone visit. With either a video or telephone visit, we are not always able to ensure that we have a secure connection.  By engaging in this virtual visit, you consent to the provision of healthcare and authorize for your insurance to be billed (if applicable) for the services provided during this visit. Depending on your insurance coverage, you may receive a charge related to this service.  I need to obtain your verbal consent now. Are you willing to proceed with your visit today? Casey [name] has provided verbal consent on 01/05/2024 for a virtual visit (video or telephone). Viviano Simas, FNP  Date: 01/05/2024 2:58 PM   Virtual Visit via Video Note   I, Viviano Simas, connected with  Casey [name]  ([id number], [birth year]) on 01/05/24 at  3:00 PM EST by a video-enabled telemedicine application and verified that I am speaking with the correct person using two identifiers.  Location: Patient: Virtual Visit Location Patient:  Home Provider: Virtual Visit Location Provider: Home Office   I discussed the limitations of evaluation and management by telemedicine and the availability of in person appointments. The patient expressed understanding and agreed to proceed.    History of Present Illness: Casey [name] is a 63 y.o. 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    She denies a history of asthma or need for inhalers  She has been using Flonase each night  Also using Alka Seltzer OTC   Problems:  Patient Active Problem List   Diagnosis Date Noted   OSA on CPAP 09/14/2023   Primary insomnia 10/09/2021   Chronic diarrhea 10/09/2021   Morbid obesity (HCC)    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   GERD (gastroesophageal reflux disease) 03/18/2014   Depression 03/18/2014   Hypertension 03/28/2013   Hyperlipidemia 03/28/2013   Hypothyroidism 03/28/2013   Graves' disease     Allergies:  Allergies  Allergen Reactions   Adhesive  [Tape] Itching    BANDAIDS   Benzalkonium  Buprenorphine Hcl Itching   Labetalol Itching   Morphine And Codeine Itching   Other     [place] seafood seasoning    Wound Dressings    Zosyn [Piperacillin Sod-Tazobactam So] Rash    Pt states has had dose since and did not have difficulty, rash only on first dose , not in doses following    Medications:  Current Outpatient Medications:    cetirizine (ZYRTEC) 10 MG tablet, TAKE ONE (1) TABLET EACH DAY, Disp: 90 tablet, Rfl: 3   cholestyramine (QUESTRAN) 4 g packet, Take 1 packet (4 g total) by mouth daily. (Patient not taking: Reported on  12/13/2023), Disp: 14 packet, Rfl: 0   cyanocobalamin (,VITAMIN B-12,) 1000 MCG/ML injection, 1ml every 14 days, Disp: 30 mL, Rfl: 1   diphenoxylate-atropine (LOMOTIL) 2.5-0.025 MG tablet, 1 po qid prn, Disp: 120 tablet, Rfl: 2   escitalopram (LEXAPRO) 10 MG tablet, Take 1 tablet (10 mg total) by mouth daily., Disp: 90 tablet, Rfl: 1   fenofibrate 160 MG tablet, TAKE ONE (1) TABLET BY MOUTH EVERY DAY **NEEDS TO BE SEEN BEFORE NEXT REFILL**, Disp: 90 tablet, Rfl: 1   fluticasone (FLONASE) 50 MCG/ACT nasal spray, USE 2 SPRAYS IN EACH NOSTRIL DAILY, Disp: 16 g, Rfl: 6   folic acid (FOLVITE) 1 MG tablet, TAKE ONE (1) TABLET EACH DAY, Disp: 90 tablet, Rfl: 3   levothyroxine (SYNTHROID) 150 MCG tablet, Take 1 tablet (150 mcg total) by mouth daily., Disp: 90 tablet, Rfl: 1   metoprolol succinate (TOPROL-XL) 50 MG 24 hr tablet, TAKE ONE (1) TABLET EACH DAY, Disp: 90 tablet, Rfl: 1   pantoprazole (PROTONIX) 40 MG tablet, Take 1 tablet (40 mg total) by mouth daily as needed. (Needs to be seen before next refill), Disp: 90 tablet, Rfl: 1   promethazine (PHENERGAN) 25 MG tablet, TAKE 1 TABLET EVERY 6 HOURS AS NEEDED FOR NAUSEA AND VOMITING, Disp: 30 tablet, Rfl: 0   RESTASIS 0.05 % ophthalmic emulsion, 2 drops in the morning and at bedtime. (Patient not taking: Reported on 12/13/2023), Disp: , Rfl:    valsartan-hydrochlorothiazide (DIOVAN-HCT) 160-12.5 MG tablet, Take 1 tablet by mouth daily., Disp: 90 tablet, Rfl: 1   Vitamin D, Ergocalciferol, (DRISDOL) 1.25 MG (50000 UNIT) CAPS capsule, Take 1 capsule (50,000 Units total) by mouth every 7 (seven) days. **NEEDS TO BE SEEN BEFORE NEXT REFILL**, Disp: 4 capsule, Rfl: 0   zolpidem (AMBIEN) 10 MG tablet, Take 1 tablet (10 mg total) by mouth at bedtime as needed. for sleep, Disp: 30 tablet, Rfl: 5  Observations/Objective: Patient is well-developed, well-nourished in no acute distress.  Resting comfortably  at home.  Head is normocephalic, atraumatic.  No labored  breathing.  Speech is clear and coherent with logical content.  Patient is alert and oriented at baseline.    Assessment and Plan:  1. Acute non-recurrent pansinusitis (Primary)   Continue over the counter medications for support including flonase daily    - amoxicillin-clavulanate (AUGMENTIN) 875-125 MG tablet; Take 1 tablet by mouth 2 (two) times daily.  Dispense: 20 tablet; Refill: 0 - ondansetron (ZOFRAN-ODT) 4 MG disintegrating tablet; Take 1 tablet (4 mg total) by mouth every 8 (eight) hours as needed for nausea.  Dispense: 20 tablet; Refill: 0      Follow Up Instructions: I discussed the assessment and treatment plan with the patient. The patient was provided an opportunity to ask questions and all were answered. The patient agreed with the plan and demonstrated an understanding of the instructions.  A copy of instructions were sent to the patient via MyChart unless otherwise noted below.     The patient was advised to call back or seek an in-person evaluation if the symptoms worsen or if the condition fails to improve as anticipated.    Viviano Simas, FNP

## 2024-01-22 ENCOUNTER — Telehealth: Admitting: Physician Assistant

## 2024-01-22 DIAGNOSIS — H1031 Unspecified acute conjunctivitis, right eye: Secondary | ICD-10-CM | POA: Diagnosis not present

## 2024-01-22 MED ORDER — OFLOXACIN 0.3 % OP SOLN: 1.0000 [drp] | Freq: Four times a day (QID) | OPHTHALMIC | 0 refills | Status: DC

## 2024-01-22 NOTE — Progress Notes (Signed)

## 2024-02-20 ENCOUNTER — Other Ambulatory Visit: Payer: Self-pay | Admitting: Nurse Practitioner

## 2024-05-21 ENCOUNTER — Other Ambulatory Visit: Payer: Self-pay | Admitting: Nurse Practitioner

## 2024-05-21 DIAGNOSIS — E7800 Pure hypercholesterolemia, unspecified: Secondary | ICD-10-CM

## 2024-06-08 ENCOUNTER — Ambulatory Visit: Payer: 59 | Admitting: Nurse Practitioner

## 2024-06-13 ENCOUNTER — Other Ambulatory Visit: Payer: Self-pay | Admitting: Nurse Practitioner

## 2024-06-13 DIAGNOSIS — I10 Essential (primary) hypertension: Secondary | ICD-10-CM

## 2024-06-13 DIAGNOSIS — F3342 Major depressive disorder, recurrent, in full remission: Secondary | ICD-10-CM

## 2024-06-18 ENCOUNTER — Other Ambulatory Visit: Payer: Self-pay | Admitting: Nurse Practitioner

## 2024-06-18 DIAGNOSIS — Z1231 Encounter for screening mammogram for malignant neoplasm of breast: Secondary | ICD-10-CM

## 2024-07-02 ENCOUNTER — Ambulatory Visit: Admitting: Nurse Practitioner

## 2024-07-02 ENCOUNTER — Ambulatory Visit
Admission: RE | Admit: 2024-07-02 | Discharge: 2024-07-02 | Disposition: A | Discharge status: Home / Self Care | Source: Ambulatory Visit | Attending: Nurse Practitioner | Admitting: Nurse Practitioner

## 2024-07-02 ENCOUNTER — Encounter: Payer: Self-pay | Admitting: Nurse Practitioner

## 2024-07-02 VITALS — BP 111/68 | HR 62 | Temp 98.2°F | Ht 70.0 in | Wt 208.0 lb

## 2024-07-02 DIAGNOSIS — I10 Essential (primary) hypertension: Secondary | ICD-10-CM

## 2024-07-02 DIAGNOSIS — E039 Hypothyroidism, unspecified: Secondary | ICD-10-CM

## 2024-07-02 DIAGNOSIS — D373 Neoplasm of uncertain behavior of appendix: Secondary | ICD-10-CM | POA: Diagnosis not present

## 2024-07-02 DIAGNOSIS — Z0000 Encounter for general adult medical examination without abnormal findings: Secondary | ICD-10-CM

## 2024-07-02 DIAGNOSIS — Z0001 Encounter for general adult medical examination with abnormal findings: Secondary | ICD-10-CM | POA: Diagnosis not present

## 2024-07-02 DIAGNOSIS — F3342 Major depressive disorder, recurrent, in full remission: Secondary | ICD-10-CM

## 2024-07-02 DIAGNOSIS — Z1231 Encounter for screening mammogram for malignant neoplasm of breast: Secondary | ICD-10-CM

## 2024-07-02 DIAGNOSIS — K529 Noninfective gastroenteritis and colitis, unspecified: Secondary | ICD-10-CM

## 2024-07-02 DIAGNOSIS — E7800 Pure hypercholesterolemia, unspecified: Secondary | ICD-10-CM | POA: Diagnosis not present

## 2024-07-02 DIAGNOSIS — G4733 Obstructive sleep apnea (adult) (pediatric): Secondary | ICD-10-CM

## 2024-07-02 DIAGNOSIS — K219 Gastro-esophageal reflux disease without esophagitis: Secondary | ICD-10-CM

## 2024-07-02 DIAGNOSIS — F5101 Primary insomnia: Secondary | ICD-10-CM

## 2024-07-02 MED ORDER — LEVOTHYROXINE SODIUM 150 MCG PO TABS: 150.0000 ug | ORAL_TABLET | Freq: Every day | ORAL | 1 refills | Status: AC

## 2024-07-02 MED ORDER — FENOFIBRATE 160 MG PO TABS: 160.0000 mg | ORAL_TABLET | Freq: Every day | ORAL | 1 refills | Status: AC

## 2024-07-02 MED ORDER — ESCITALOPRAM OXALATE 10 MG PO TABS: 10.0000 mg | ORAL_TABLET | Freq: Every day | ORAL | 1 refills | Status: AC

## 2024-07-02 MED ORDER — METOPROLOL SUCCINATE ER 50 MG PO TB24: 50.0000 mg | ORAL_TABLET | Freq: Every day | ORAL | 1 refills | Status: AC

## 2024-07-02 MED ORDER — DIPHENOXYLATE-ATROPINE 2.5-0.025 MG PO TABS: ORAL_TABLET | ORAL | 2 refills | Status: AC

## 2024-07-02 MED ORDER — ZOLPIDEM TARTRATE 10 MG PO TABS: 10.0000 mg | ORAL_TABLET | Freq: Every evening | ORAL | 5 refills | Status: AC | PRN

## 2024-07-02 MED ORDER — PANTOPRAZOLE SODIUM 40 MG PO TBEC: 40.0000 mg | DELAYED_RELEASE_TABLET | Freq: Every day | ORAL | 1 refills | Status: AC | PRN

## 2024-07-02 MED ORDER — VALSARTAN-HYDROCHLOROTHIAZIDE 160-12.5 MG PO TABS: 1.0000 | ORAL_TABLET | Freq: Every day | ORAL | 1 refills | Status: AC

## 2024-07-02 NOTE — Patient Instructions (Signed)

## 2024-07-02 NOTE — Progress Notes (Signed)
 Subjective:    Patient ID: Casey [name], female    DOB: Jun 28, 1961, 63 y.o.   MRN: [id number]   Chief Complaint: annual physical    HPI:  Casey [name] is a 63 y.o. who identifies as a female who was assigned female at birth.   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 Primary hypertension No c/o chest pain, sob or headache. Doe snot check blood pressure at home. BP Readings from Last 3 Encounters:  12/13/23 110/71  10/14/23 (!) 156/65  09/14/23 112/66     2. Pure hypercholesterolemia Does try to watch diet but does no dedicated exercise. Lab Results  Component Value Date   CHOL 140 12/13/2023   HDL 57 12/13/2023   LDLCALC 65 12/13/2023   TRIG 96 12/13/2023   CHOLHDL 2.5 12/13/2023   The 10-year ASCVD risk score (Arnett DK, et al., 2019) is: 3.1%    3. 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 Has had no reoccurrence.  4. Chronic diarrhea Has had chronic diarrhea since she had her cancer treatement in 2017. Is better with lomotil   5. Gastroesophageal reflux disease without esophagitis Is on protionix daily  6. Acquired hypothyroidism No issues that she is aware of. We increased her synthroid  to 175mcg daily. Lab Results  Component Value Date   TSH 1.960 03/31/2023     7. Recurrent major depressive disorder, in full remission (HCC) Is on lexapro  daily and has been doing well.    07/02/2024   11:07 AM 12/13/2023    3:32 PM 03/31/2023    3:46 PM  Depression screen PHQ 2/9  Decreased Interest 0 0 0  Down, Depressed, Hopeless 0 1 0  PHQ - 2 Score 0 1 0  Altered sleeping  1 2  Tired, decreased energy  0 2  Change in appetite  1 2  Feeling bad or failure about yourself   0 0  Trouble concentrating  0 1  Moving slowly or fidgety/restless  0 2  Suicidal thoughts  0 0  PHQ-9 Score  3 9  Difficult doing work/chores   Not difficult at all Somewhat difficult      8. Primary insomnia Is on ambien  to sleep at night  9. OSA on CPAP Wears CPAP  nightly  10. BMI 29.0-29.9 (HCC) No recent weight changes  Wt Readings from Last 3 Encounters:  07/02/24 208 lb (94.3 kg)  12/13/23 210 lb (95.3 kg)  10/14/23 207 lb (93.9 kg)   BMI Readings from Last 3 Encounters:  07/02/24 29.84 kg/m  12/13/23 30.13 kg/m  10/14/23 29.70 kg/m      New complaints: None today  Allergies  Allergen Reactions   Adhesive  [Tape] Itching    BANDAIDS   Benzalkonium    Buprenorphine Hcl Itching   Labetalol Itching   Morphine  And Codeine Itching   Other     [place] seafood seasoning    Wound Dressings    Zosyn  [Piperacillin  Sod-Tazobactam So] Rash    Pt states has had dose since and did not have difficulty, rash only on first dose , not in doses following    Outpatient Encounter Medications as of 07/02/2024  Medication Sig   amoxicillin -clavulanate (AUGMENTIN ) 875-125 MG tablet Take 1 tablet by mouth 2 (two) times daily.   cetirizine  (ZYRTEC ) 10 MG tablet TAKE ONE (1) TABLET EACH  DAY   cholestyramine  (QUESTRAN ) 4 g packet Take 1 packet (4 g total) by mouth daily. (Patient not taking: Reported on 12/13/2023)   cyanocobalamin  (,VITAMIN B-12,) 1000 MCG/ML injection 1ml every 14 days   diphenoxylate -atropine  (LOMOTIL ) 2.5-0.025 MG tablet 1 po qid prn   escitalopram  (LEXAPRO ) 10 MG tablet TAKE ONE (1) TABLET BY MOUTH EVERY DAY   fenofibrate  160 MG tablet TAKE ONE (1) TABLET BY MOUTH EVERY DAY   fluticasone  (FLONASE ) 50 MCG/ACT nasal spray USE 2 SPRAYS IN EACH NOSTRIL DAILY   folic acid  (FOLVITE ) 1 MG tablet TAKE ONE (1) TABLET EACH DAY   levothyroxine  (SYNTHROID ) 150 MCG tablet Take 1 tablet (150 mcg total) by mouth daily.   metoprolol  succinate (TOPROL -XL) 50 MG 24 hr tablet TAKE ONE (1) TABLET BY MOUTH EVERY DAY   ofloxacin  (OCUFLOX ) 0.3 % ophthalmic solution Place 1 drop into the right eye 4 (four) times daily.    ondansetron  (ZOFRAN -ODT) 4 MG disintegrating tablet Take 1 tablet (4 mg total) by mouth every 8 (eight) hours as needed for nausea.   pantoprazole  (PROTONIX ) 40 MG tablet Take 1 tablet (40 mg total) by mouth daily as needed. (Needs to be seen before next refill)   promethazine  (PHENERGAN ) 25 MG tablet TAKE 1 TABLET EVERY 6 HOURS AS NEEDED FOR NAUSEA AND VOMITING   RESTASIS 0.05 % ophthalmic emulsion 2 drops in the morning and at bedtime. (Patient not taking: Reported on 12/13/2023)   valsartan -hydrochlorothiazide  (DIOVAN -HCT) 160-12.5 MG tablet Take 1 tablet by mouth daily.   Vitamin D , Ergocalciferol , (DRISDOL ) 1.25 MG (50000 UNIT) CAPS capsule TAKE 1 CAPSULE BY MOUTH EVERY 7 DAYS   zolpidem  (AMBIEN ) 10 MG tablet Take 1 tablet (10 mg total) by mouth at bedtime as needed. for sleep   No facility-administered encounter medications on file as of 07/02/2024.    Past Surgical History:  Procedure Laterality Date   APPENDECTOMY     CRYOTHERAPY     for abnormal pap   DIAGNOSTIC LAPAROSCOPY     DILATION AND CURETTAGE OF UTERUS     infertility testing      LAPAROSCOPIC PARTIAL COLECTOMY N/A 02/02/2016   Procedure: LAPAROSCOPIC PARTIAL COlectomy right colon;  Surgeon: Camellia Blush, MD;  Location: [facility];  Service: General;  Laterality: N/A;   NECK SURGERY     ruptured disc 04/2021   OOPHORECTOMY     & fallopian tubes   SKIN CANCER EXCISION     2023   TONSILLECTOMY     VAGINAL HYSTERECTOMY     total   VESICOVAGINAL FISTULA CLOSURE W/ TAH     Dr. Macario Sharps  2 degree DUB     Family History  Problem Relation Age of Onset   Cancer Mother        skin   Hypertension Mother    Irregular heart beat Mother    Myasthenia gravis Mother    Cancer Father        prostate and skin   Thyroid  disease Maternal Grandmother    Cancer Maternal Grandmother        lung   Aneurysm Maternal Grandfather        stomach   Cancer Paternal Grandmother    Cancer Paternal Grandfather        lung   Heart disease  Paternal Grandfather    Breast cancer Neg Hx    Colon cancer Neg Hx       Controlled substance contract: n/a     Review of Systems  Constitutional:  Negative for diaphoresis.  Eyes:  Negative for pain.  Respiratory:  Negative for shortness of breath.   Cardiovascular:  Negative for chest pain, palpitations and leg swelling.  Gastrointestinal:  Negative for abdominal pain.  Endocrine: Negative for polydipsia.  Skin:  Negative for rash.  Neurological:  Negative for dizziness, weakness and headaches.  Hematological:  Does not bruise/bleed easily.  All other systems reviewed and are negative.      Objective:   Physical Exam Vitals reviewed.  Constitutional:      General: She is not in acute distress.    Appearance: Normal appearance. She is well-developed.  HENT:     Head: Normocephalic.     Right Ear: Tympanic membrane normal.     Left Ear: Tympanic membrane normal.     Nose: Nose normal.     Mouth/Throat:     Mouth: Mucous membranes are moist.  Eyes:     Pupils: Pupils are equal, round, and reactive to light.  Neck:     Vascular: No carotid bruit or JVD.  Cardiovascular:     Rate and Rhythm: Normal rate and regular rhythm.     Heart sounds: Normal heart sounds.  Pulmonary:     Effort: Pulmonary effort is normal. No respiratory distress.     Breath sounds: Normal breath sounds. No wheezing or rales.  Chest:     Chest wall: No tenderness.  Abdominal:     General: Bowel sounds are normal. There is no distension or abdominal bruit.     Palpations: Abdomen is soft. There is no hepatomegaly, splenomegaly, mass or pulsatile mass.     Tenderness: There is no abdominal tenderness.  Musculoskeletal:        General: Normal range of motion.     Cervical back: Normal range of motion and neck supple.  Lymphadenopathy:     Cervical: No cervical adenopathy.  Skin:    General: Skin is warm and dry.  Neurological:     Mental Status: She is alert and oriented to person,  place, and time.     Deep Tendon Reflexes: Reflexes are normal and symmetric.  Psychiatric:        Behavior: Behavior normal.        Thought Content: Thought content normal.        Judgment: Judgment normal.    BP 111/68   Pulse 62   Temp 98.2 F (36.8 C)   Ht 5' 10 (1.778 m)   Wt 208 lb (94.3 kg)   LMP 05/23/1995   SpO2 98%   BMI 29.84 kg/m    LMP 05/23/1995        Assessment & Plan:  Casey [name] comes in today with chief complaint of annual physical  Diagnosis and orders addressed:  1. Primary hypertension Low sodium diet - CBC with Differential/Platelet - CMP14+EGFR - metoprolol  succinate (TOPROL -XL) 50 MG 24 hr tablet; Take with or immediately following a meal.  Dispense: 90 tablet; Refill: 0 - valsartan -hydrochlorothiazide  (DIOVAN -HCT) 160-12.5 MG tablet; TAKE ONE (1) TABLET EACH DAY (Needs to be seen before next refill)  Dispense: 90 tablet; Refill: 1  2. Pure hypercholesterolemia Low fta diet - Lipid panel - fenofibrate  160 MG tablet; TAKE ONE (1) TABLET EACH DAY  Dispense: 90 tablet; Refill: 1  3. Low grade mucinous neoplasm of appendix Keep follow up with specialist  4. Chronic diarrhea - diphenoxylate -atropine  (LOMOTIL ) 2.5-0.025 MG tablet; 1 po qid prn  Dispense: 120 tablet; Refill: 2  5. Gastroesophageal reflux disease without  esophagitis Avoid spicy foods Do not eat 2 hours prior to bedtime - pantoprazole  (PROTONIX ) 40 MG tablet; Take 1 tablet (40 mg total) by mouth daily as needed. (Needs to be seen before next refill)  Dispense: 90 tablet; Refill: 1  6. Acquired hypothyroidism Labs pending - Thyroid  Panel With TSH  7. Recurrent major depressive disorder, in full remission (HCC) Stress management - escitalopram  (LEXAPRO ) 10 MG tablet; Take 1 tablet (10 mg total) by mouth daily.  Dispense: 90 tablet; Refill: 1  8. Primary insomnia Bedtime routine - zolpidem  (AMBIEN ) 10 MG tablet; Take 1 tablet (10 mg total) by mouth at bedtime as  needed. for sleep  Dispense: 30 tablet; Refill: 5  9. Morbid obesity (HCC) Discussed diet and exercise for person with BMI >25 Will recheck weight in 3-6 months    Labs pending Health Maintenance reviewed Diet and exercise encouraged  Follow up plan: 6 months   [name] Gladis, FNP

## 2024-07-03 ENCOUNTER — Ambulatory Visit: Payer: Self-pay | Admitting: Nurse Practitioner

## 2024-07-03 LAB — LIPID PANEL
Chol/HDL Ratio: 2.7 ratio (ref 0.0–4.4)
Cholesterol, Total: 150 mg/dL (ref 100–199)
HDL: 55 mg/dL (ref >39)
LDL Chol Calc (NIH): 75 mg/dL (ref 0–99)
Triglycerides: 114 mg/dL (ref 0–149)
VLDL Cholesterol Cal: 20 mg/dL (ref 5–40)

## 2024-07-03 LAB — CMP14+EGFR
ALT: 17 IU/L (ref 0–32)
AST: 23 IU/L (ref 0–40)
Albumin: 4.3 g/dL (ref 3.9–4.9)
Alkaline Phosphatase: 51 IU/L (ref 44–121)
BUN/Creatinine Ratio: 14 (ref 12–28)
BUN: 17 mg/dL (ref 8–27)
Bilirubin Total: 0.3 mg/dL (ref 0.0–1.2)
CO2: 24 mmol/L (ref 20–29)
Calcium: 9.6 mg/dL (ref 8.7–10.3)
Chloride: 104 mmol/L (ref 96–106)
Creatinine, Ser: 1.22 mg/dL — ABNORMAL HIGH (ref 0.57–1.00)
Globulin, Total: 2.4 g/dL (ref 1.5–4.5)
Glucose: 121 mg/dL — ABNORMAL HIGH (ref 70–99)
Potassium: 3.8 mmol/L (ref 3.5–5.2)
Sodium: 145 mmol/L — ABNORMAL HIGH (ref 134–144)
Total Protein: 6.7 g/dL (ref 6.0–8.5)
eGFR: 50 mL/min/1.73 — ABNORMAL LOW (ref >59)

## 2024-07-03 LAB — CBC WITH DIFFERENTIAL/PLATELET
Basophils Absolute: 0.1 x10E3/uL (ref 0.0–0.2)
Basos: 1 %
EOS (ABSOLUTE): 0.2 x10E3/uL (ref 0.0–0.4)
Eos: 3 %
Hematocrit: 40.7 % (ref 34.0–46.6)
Hemoglobin: 13.1 g/dL (ref 11.1–15.9)
Immature Grans (Abs): 0 x10E3/uL (ref 0.0–0.1)
Immature Granulocytes: 0 %
Lymphocytes Absolute: 1.6 x10E3/uL (ref 0.7–3.1)
Lymphs: 21 %
MCH: 29.1 pg (ref 26.6–33.0)
MCHC: 32.2 g/dL (ref 31.5–35.7)
MCV: 90 fL (ref 79–97)
Monocytes Absolute: 0.6 x10E3/uL (ref 0.1–0.9)
Monocytes: 7 %
Neutrophils Absolute: 5.4 x10E3/uL (ref 1.4–7.0)
Neutrophils: 68 %
Platelets: 231 x10E3/uL (ref 150–450)
RBC: 4.5 x10E6/uL (ref 3.77–5.28)
RDW: 12.5 % (ref 11.7–15.4)
WBC: 7.9 x10E3/uL (ref 3.4–10.8)

## 2024-07-03 LAB — THYROID PANEL WITH TSH
Free Thyroxine Index: 2.8 (ref 1.2–4.9)
T3 Uptake Ratio: 28 % (ref 24–39)
T4, Total: 10 ug/dL (ref 4.5–12.0)
TSH: 1.96 u[IU]/mL (ref 0.450–4.500)

## 2024-08-01 ENCOUNTER — Other Ambulatory Visit: Payer: Self-pay | Admitting: Nurse Practitioner

## 2024-08-05 ENCOUNTER — Telehealth: Admitting: Family Medicine

## 2024-08-05 DIAGNOSIS — B9689 Other specified bacterial agents as the cause of diseases classified elsewhere: Secondary | ICD-10-CM | POA: Diagnosis not present

## 2024-08-05 DIAGNOSIS — J019 Acute sinusitis, unspecified: Secondary | ICD-10-CM

## 2024-08-05 NOTE — Progress Notes (Signed)

## 2024-08-06 MED ORDER — AZITHROMYCIN 250 MG PO TABS: ORAL_TABLET | ORAL | 0 refills | Status: AC

## 2024-08-06 NOTE — Telephone Encounter (Signed)
 Patient did evisit on Sunday and was prescribed doxycycline . Medication was  not sent to pharmacy. She also requested no doxycycline  because it makes hr so sick. Evisit reviewed in chart Meds ordered this encounter  Medications   Vitamin D , Ergocalciferol , (DRISDOL ) 1.25 MG (50000 UNIT) CAPS capsule    Sig: TAKE 1 CAPSULE BY MOUTH EVERY 7 DAYS    Dispense:  4 capsule    Refill:  1   azithromycin  (ZITHROMAX  Z-PAK) 250 MG tablet    Sig: As directed    Dispense:  6 tablet    Refill:  0    Supervising Provider:   MARYANNE CHEW A [[id number]]   [name] Gladis, FNP

## 2024-09-18 NOTE — Progress Notes (Unsigned)
 Virtual Visit via Video Note  I connected with Casey [name] on 09/19/24 at  3:30 PM EDT by a video enabled telemedicine application and verified that I am speaking with the correct person using two identifiers.  Location: Patient: at her home Provider: in the office    I discussed the limitations of evaluation and management by telemedicine and the availability of in person appointments. The patient expressed understanding and agreed to proceed.  History of Present Illness: 09/19/24 SS: VV, CPAP report shows 80% compliance, 6-11 cm water, AHI 1.6, leak 22. CPAP is going great! It has become her best friend. Sleeps really well with it on. Loves her CPAP. Good energy during the day, not falling asleep. Keep her grand child on the weekend, often forgets to take the CPAP. 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 Uses FFM, is a mouth breather. Sometimes the mask leaks if she doesn't keep it tight, when she rolls over.   09/14/23 SS: Here today for initial CPAP study.  Saw Dr. Buck in June 2024 he reported snoring and excessive daytime somnolence, difficulty staying asleep.  Had HST 06/28/2023 showed overall mild OSA with total AHI 12.2/hour and O2 nadir 85%.  Snoring was in mild to moderate range.  Recommended CPAP treatment.CPAP download 08/14/23-09/12/23 90% usage, greater than 4 hours 87%.  Average usage days used 7 hours 38 minutes. 6-11 cm.  Leak 9.5.  AHI 1.3.  Doing overall very well on CPAP.  Is sleeping much better, sleeping through the night. In the morning, has good energy, feels restored in the morning. Lately has noted some drooling in the mask. Using FFM. She is a pension scheme manager, has more stamina and energy at work.   05/16/23 Dr. Buck: I saw your patient, Casey [name], upon your kind request in my sleep clinic today for initial consultation of her sleep disorder, in particular, concern for underlying obstructive sleep apnea.  The patient is accompanied by her husband today.  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  Her sleep difficulty dates back to 6 or 7 years ago when she had her cancer surgery.  She has not slept well since then. Her Epworth sleepiness score is 16 out of 24, fatigue severity score is 45 out of 63.  She has been on Ambien  for years.  She is currently taking 10 mg each night, several years ago she tried Ambien  CR.  She has occasional restless leg symptoms and needs to move her legs, sometimes gets out of bed and walks around.  She does snore with variable snoring reported per husband, sometimes she breathes so quietly that he checks on her.  She lives with her husband and currently they are 87 year old daughter moved in with her 34-year-old son.  I reviewed your office note from 03/31/2023.  She reports vivid dreams, does not always recall her dreams.  She has nocturia about once per average night, has had some morning headaches including reoccurrence of migraines lately, she used to have migraines more frequently but they improved after hysterectomy.  She does not drink any alcohol, she is a non-smoker, she drinks caffeine in  limitation.  She goes to bed around 9 or 9:30 PM, in the summertime may be as late as 11 PM now that school is out.  Her rise time is generally around 7:30 AM.  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  She has 3 grown children, ages 76, 107 year old son and 48 year old daughter, they have a total of  5 grandchildren.  Sometimes she takes Tylenol  at night for restless leg symptoms, because of her extensive intestinal surgery she does not take any NSAIDs.   Her mom has sleep apnea and uses a CPAP machine.  She does fall asleep with the TV on at night, she feels that it relaxes her and distract her mind.  She does not endorse rumination of thought in the middle of the night.  He usually turns the TV off when he goes to bed.  She is hoping to retire in a year.   Observations/Objective: Via video visit  Assessment and Plan: 1.  OSA on CPAP (setup August 2024)  - Excellent compliance and benefit with CPAP - Continue nightly usage minimum 4 hours - Continue current settings - Continue to replace supplies routinely through DME - Follow-up in 1 year virtually  Follow Up Instructions: 1 year my chart visit    I discussed the assessment and treatment plan with the patient. The patient was provided an opportunity to ask questions and all were answered. The patient agreed with the plan and demonstrated an understanding of the instructions.   The patient was advised to call back or seek an in-person evaluation if the symptoms worsen or if the condition fails to improve as anticipated.  Casey Gayland MANDES, DNP  [facility] [address] [place], [place] [address] [phone]

## 2024-09-19 ENCOUNTER — Telehealth: Payer: 59 | Admitting: Neurology

## 2024-09-19 DIAGNOSIS — G4733 Obstructive sleep apnea (adult) (pediatric): Secondary | ICD-10-CM | POA: Diagnosis not present

## 2024-09-19 NOTE — Patient Instructions (Signed)
 Great to see you today! Continue CPAP usage minimum 4 hours nightly Continue current settings Continue to replace supplies routinely through DME Follow-up in 1 year or sooner if needed. Thanks!!

## 2024-10-08 ENCOUNTER — Other Ambulatory Visit: Payer: Self-pay | Admitting: Nurse Practitioner

## 2024-12-03 ENCOUNTER — Other Ambulatory Visit: Payer: Self-pay | Admitting: Nurse Practitioner

## 2024-12-31 ENCOUNTER — Ambulatory Visit: Payer: Self-pay | Admitting: Nurse Practitioner

## 2025-01-15 ENCOUNTER — Ambulatory Visit: Admitting: Nurse Practitioner

## 2025-09-18 ENCOUNTER — Telehealth: Admitting: Neurology
